# Patient Record
Sex: Male | Born: 1940 | Race: White | Hispanic: No | Marital: Married | Smoking: Former smoker
Health system: Southern US, Community
[De-identification: ages and names within clinical notes are randomized; demographics above are authoritative.]

## PROBLEM LIST (undated history)

## (undated) DIAGNOSIS — C801 Malignant (primary) neoplasm, unspecified: Secondary | ICD-10-CM

## (undated) DIAGNOSIS — I10 Essential (primary) hypertension: Secondary | ICD-10-CM

## (undated) DIAGNOSIS — F329 Major depressive disorder, single episode, unspecified: Secondary | ICD-10-CM

## (undated) DIAGNOSIS — C779 Secondary and unspecified malignant neoplasm of lymph node, unspecified: Secondary | ICD-10-CM

## (undated) DIAGNOSIS — I739 Peripheral vascular disease, unspecified: Secondary | ICD-10-CM

## (undated) DIAGNOSIS — K449 Diaphragmatic hernia without obstruction or gangrene: Secondary | ICD-10-CM

## (undated) DIAGNOSIS — I219 Acute myocardial infarction, unspecified: Secondary | ICD-10-CM

## (undated) DIAGNOSIS — R238 Other skin changes: Secondary | ICD-10-CM

## (undated) DIAGNOSIS — I714 Abdominal aortic aneurysm, without rupture, unspecified: Secondary | ICD-10-CM

## (undated) DIAGNOSIS — J449 Chronic obstructive pulmonary disease, unspecified: Secondary | ICD-10-CM

## (undated) DIAGNOSIS — Z923 Personal history of irradiation: Secondary | ICD-10-CM

## (undated) DIAGNOSIS — Z720 Tobacco use: Secondary | ICD-10-CM

## (undated) DIAGNOSIS — N289 Disorder of kidney and ureter, unspecified: Secondary | ICD-10-CM

## (undated) DIAGNOSIS — E785 Hyperlipidemia, unspecified: Secondary | ICD-10-CM

## (undated) DIAGNOSIS — E119 Type 2 diabetes mellitus without complications: Secondary | ICD-10-CM

## (undated) DIAGNOSIS — I2510 Atherosclerotic heart disease of native coronary artery without angina pectoris: Secondary | ICD-10-CM

## (undated) DIAGNOSIS — K22711 Barrett's esophagus with high grade dysplasia: Secondary | ICD-10-CM

## (undated) DIAGNOSIS — K219 Gastro-esophageal reflux disease without esophagitis: Secondary | ICD-10-CM

## (undated) DIAGNOSIS — F32A Depression, unspecified: Secondary | ICD-10-CM

## (undated) DIAGNOSIS — F419 Anxiety disorder, unspecified: Secondary | ICD-10-CM

## (undated) DIAGNOSIS — J189 Pneumonia, unspecified organism: Secondary | ICD-10-CM

## (undated) DIAGNOSIS — R233 Spontaneous ecchymoses: Secondary | ICD-10-CM

## (undated) HISTORY — DX: Major depressive disorder, single episode, unspecified: F32.9

## (undated) HISTORY — DX: Gastro-esophageal reflux disease without esophagitis: K21.9

## (undated) HISTORY — DX: Hyperlipidemia, unspecified: E78.5

## (undated) HISTORY — DX: Anxiety disorder, unspecified: F41.9

## (undated) HISTORY — DX: Personal history of irradiation: Z92.3

## (undated) HISTORY — PX: NISSEN FUNDOPLICATION: SHX2091

## (undated) HISTORY — DX: Abdominal aortic aneurysm, without rupture, unspecified: I71.40

## (undated) HISTORY — DX: Peripheral vascular disease, unspecified: I73.9

## (undated) HISTORY — DX: Diaphragmatic hernia without obstruction or gangrene: K44.9

## (undated) HISTORY — PX: TONSILLECTOMY: SUR1361

## (undated) HISTORY — DX: Tobacco use: Z72.0

## (undated) HISTORY — DX: Chronic obstructive pulmonary disease, unspecified: J44.9

## (undated) HISTORY — DX: Atherosclerotic heart disease of native coronary artery without angina pectoris: I25.10

## (undated) HISTORY — DX: Abdominal aortic aneurysm, without rupture: I71.4

## (undated) HISTORY — DX: Acute myocardial infarction, unspecified: I21.9

## (undated) HISTORY — PX: HERNIA REPAIR: SHX51

## (undated) HISTORY — DX: Depression, unspecified: F32.A

## (undated) HISTORY — PX: RADIOFREQUENCY ABLATION: SHX2290

---

## 1996-11-02 HISTORY — PX: CORONARY ANGIOPLASTY WITH STENT PLACEMENT: SHX49

## 1996-12-31 DIAGNOSIS — I219 Acute myocardial infarction, unspecified: Secondary | ICD-10-CM

## 1996-12-31 HISTORY — DX: Acute myocardial infarction, unspecified: I21.9

## 1998-06-04 ENCOUNTER — Other Ambulatory Visit: Admission: RE | Admit: 1998-06-04 | Discharge: 1998-06-04 | Payer: Self-pay | Admitting: Gastroenterology

## 1998-11-28 ENCOUNTER — Other Ambulatory Visit: Admission: RE | Admit: 1998-11-28 | Discharge: 1998-11-28 | Payer: Self-pay | Admitting: Internal Medicine

## 1999-01-13 ENCOUNTER — Encounter: Payer: Self-pay | Admitting: Gastroenterology

## 1999-01-13 ENCOUNTER — Ambulatory Visit (HOSPITAL_COMMUNITY): Admission: RE | Admit: 1999-01-13 | Discharge: 1999-01-13 | Payer: Self-pay | Admitting: Gastroenterology

## 1999-01-16 ENCOUNTER — Encounter: Payer: Self-pay | Admitting: Gastroenterology

## 1999-01-16 ENCOUNTER — Ambulatory Visit (HOSPITAL_COMMUNITY): Admission: RE | Admit: 1999-01-16 | Discharge: 1999-01-16 | Payer: Self-pay | Admitting: Gastroenterology

## 1999-02-01 ENCOUNTER — Encounter: Payer: Self-pay | Admitting: Gastroenterology

## 1999-02-01 ENCOUNTER — Ambulatory Visit (HOSPITAL_COMMUNITY): Admission: RE | Admit: 1999-02-01 | Discharge: 1999-02-01 | Payer: Self-pay | Admitting: Gastroenterology

## 1999-09-05 ENCOUNTER — Other Ambulatory Visit: Admission: RE | Admit: 1999-09-05 | Discharge: 1999-09-05 | Payer: Self-pay | Admitting: Gastroenterology

## 1999-09-05 ENCOUNTER — Encounter (INDEPENDENT_AMBULATORY_CARE_PROVIDER_SITE_OTHER): Payer: Self-pay | Admitting: Specialist

## 2000-04-23 ENCOUNTER — Ambulatory Visit (HOSPITAL_COMMUNITY): Admission: RE | Admit: 2000-04-23 | Discharge: 2000-04-23 | Payer: Self-pay | Admitting: Cardiology

## 2000-04-23 ENCOUNTER — Encounter: Payer: Self-pay | Admitting: Cardiology

## 2000-05-17 ENCOUNTER — Other Ambulatory Visit: Admission: RE | Admit: 2000-05-17 | Discharge: 2000-05-17 | Payer: Self-pay | Admitting: Gastroenterology

## 2000-05-17 ENCOUNTER — Encounter (INDEPENDENT_AMBULATORY_CARE_PROVIDER_SITE_OTHER): Payer: Self-pay | Admitting: Specialist

## 2002-09-05 DIAGNOSIS — K2980 Duodenitis without bleeding: Secondary | ICD-10-CM | POA: Insufficient documentation

## 2004-02-07 ENCOUNTER — Encounter: Admission: RE | Admit: 2004-02-07 | Discharge: 2004-05-07 | Payer: Self-pay | Admitting: Internal Medicine

## 2004-09-01 ENCOUNTER — Ambulatory Visit: Payer: Self-pay | Admitting: Cardiology

## 2004-09-03 ENCOUNTER — Ambulatory Visit: Payer: Self-pay | Admitting: Internal Medicine

## 2004-10-14 ENCOUNTER — Ambulatory Visit: Payer: Self-pay | Admitting: Cardiology

## 2004-12-04 ENCOUNTER — Ambulatory Visit: Payer: Self-pay | Admitting: Cardiology

## 2005-03-09 ENCOUNTER — Ambulatory Visit: Payer: Self-pay | Admitting: Internal Medicine

## 2005-03-10 ENCOUNTER — Ambulatory Visit: Payer: Self-pay | Admitting: Internal Medicine

## 2005-03-16 ENCOUNTER — Ambulatory Visit: Payer: Self-pay | Admitting: Gastroenterology

## 2005-04-21 ENCOUNTER — Ambulatory Visit: Payer: Self-pay | Admitting: Gastroenterology

## 2005-05-07 ENCOUNTER — Encounter (INDEPENDENT_AMBULATORY_CARE_PROVIDER_SITE_OTHER): Payer: Self-pay | Admitting: Specialist

## 2005-05-07 ENCOUNTER — Ambulatory Visit: Payer: Self-pay | Admitting: Gastroenterology

## 2005-05-07 DIAGNOSIS — K22711 Barrett's esophagus with high grade dysplasia: Secondary | ICD-10-CM

## 2005-05-07 HISTORY — PX: COLONOSCOPY: SHX5424

## 2005-06-26 ENCOUNTER — Ambulatory Visit: Payer: Self-pay | Admitting: Internal Medicine

## 2005-08-21 ENCOUNTER — Ambulatory Visit: Payer: Self-pay

## 2005-10-14 ENCOUNTER — Ambulatory Visit: Payer: Self-pay | Admitting: Cardiology

## 2005-12-18 ENCOUNTER — Ambulatory Visit: Payer: Self-pay | Admitting: Cardiology

## 2006-01-19 ENCOUNTER — Ambulatory Visit: Payer: Self-pay | Admitting: Internal Medicine

## 2006-02-19 ENCOUNTER — Ambulatory Visit: Payer: Self-pay

## 2006-04-22 ENCOUNTER — Ambulatory Visit: Payer: Self-pay | Admitting: Internal Medicine

## 2006-04-30 ENCOUNTER — Ambulatory Visit: Payer: Self-pay | Admitting: Internal Medicine

## 2006-07-21 ENCOUNTER — Ambulatory Visit: Payer: Self-pay | Admitting: Internal Medicine

## 2006-10-13 ENCOUNTER — Ambulatory Visit: Payer: Self-pay | Admitting: Internal Medicine

## 2006-10-13 LAB — CONVERTED CEMR LAB
AST: 21 units/L (ref 0–37)
Chol/HDL Ratio, serum: 4.6
HDL: 34.2 mg/dL — ABNORMAL LOW (ref >39.0)
Hgb A1c MFr Bld: 6.7 % — ABNORMAL HIGH (ref 4.6–6.0)
Triglyceride fasting, serum: 135 mg/dL (ref 0–149)

## 2006-10-15 ENCOUNTER — Ambulatory Visit: Payer: Self-pay | Admitting: Cardiology

## 2006-10-20 ENCOUNTER — Ambulatory Visit: Payer: Self-pay | Admitting: Internal Medicine

## 2007-01-13 ENCOUNTER — Ambulatory Visit: Payer: Self-pay | Admitting: Internal Medicine

## 2007-01-13 LAB — CONVERTED CEMR LAB
Creatinine, Ser: 1.4 mg/dL (ref 0.4–1.5)
Glucose, Bld: 108 mg/dL — ABNORMAL HIGH (ref 70–99)
Hgb A1c MFr Bld: 6.9 % — ABNORMAL HIGH (ref 4.6–6.0)

## 2007-01-19 ENCOUNTER — Ambulatory Visit: Payer: Self-pay | Admitting: Internal Medicine

## 2007-02-21 ENCOUNTER — Ambulatory Visit: Payer: Self-pay

## 2007-03-17 ENCOUNTER — Ambulatory Visit: Payer: Self-pay | Admitting: *Deleted

## 2007-03-25 ENCOUNTER — Ambulatory Visit: Payer: Self-pay | Admitting: Cardiology

## 2007-04-14 ENCOUNTER — Encounter: Admission: RE | Admit: 2007-04-14 | Discharge: 2007-04-14 | Payer: Self-pay | Admitting: *Deleted

## 2007-04-21 ENCOUNTER — Ambulatory Visit: Payer: Self-pay | Admitting: *Deleted

## 2007-05-11 ENCOUNTER — Ambulatory Visit: Payer: Self-pay | Admitting: Internal Medicine

## 2007-05-11 LAB — CONVERTED CEMR LAB
BUN: 39 mg/dL — ABNORMAL HIGH (ref 6–23)
CO2: 18 meq/L — ABNORMAL LOW (ref 19–32)
Cholesterol: 168 mg/dL (ref 0–200)
Creatinine, Ser: 1.8 mg/dL — ABNORMAL HIGH (ref 0.4–1.5)
GFR calc non Af Amer: 40 mL/min
Hgb A1c MFr Bld: 6.7 % — ABNORMAL HIGH (ref 4.6–6.0)
LDL Cholesterol: 99 mg/dL (ref 0–99)
Sodium: 141 meq/L (ref 135–145)
Total CHOL/HDL Ratio: 5.4

## 2007-05-18 ENCOUNTER — Ambulatory Visit: Payer: Self-pay | Admitting: Internal Medicine

## 2007-05-18 LAB — CONVERTED CEMR LAB
BUN: 41 mg/dL — ABNORMAL HIGH (ref 6–23)
CO2: 17 meq/L — ABNORMAL LOW (ref 19–32)
GFR calc Af Amer: 52 mL/min
GFR calc non Af Amer: 43 mL/min
Glucose, Bld: 117 mg/dL — ABNORMAL HIGH (ref 70–99)
Potassium: 6 meq/L — ABNORMAL HIGH (ref 3.5–5.1)

## 2007-05-27 ENCOUNTER — Ambulatory Visit: Payer: Self-pay | Admitting: Internal Medicine

## 2007-05-27 LAB — CONVERTED CEMR LAB
BUN: 15 mg/dL (ref 6–23)
CO2: 23 meq/L (ref 19–32)
Chloride: 111 meq/L (ref 96–112)
GFR calc Af Amer: 86 mL/min
GFR calc non Af Amer: 71 mL/min
Glucose, Bld: 127 mg/dL — ABNORMAL HIGH (ref 70–99)
Sodium: 141 meq/L (ref 135–145)

## 2007-08-09 ENCOUNTER — Ambulatory Visit: Payer: Self-pay | Admitting: Internal Medicine

## 2007-08-23 ENCOUNTER — Ambulatory Visit: Payer: Self-pay | Admitting: Internal Medicine

## 2007-08-23 ENCOUNTER — Encounter: Payer: Self-pay | Admitting: Internal Medicine

## 2007-08-23 DIAGNOSIS — K2970 Gastritis, unspecified, without bleeding: Secondary | ICD-10-CM | POA: Insufficient documentation

## 2007-08-23 DIAGNOSIS — K2990 Gastroduodenitis, unspecified, without bleeding: Secondary | ICD-10-CM

## 2007-08-24 ENCOUNTER — Ambulatory Visit: Payer: Self-pay | Admitting: Internal Medicine

## 2007-08-24 LAB — CONVERTED CEMR LAB
BUN: 19 mg/dL (ref 6–23)
Calcium: 9.2 mg/dL (ref 8.4–10.5)
Chloride: 111 meq/L (ref 96–112)
GFR calc Af Amer: 96 mL/min
Glucose, Bld: 125 mg/dL — ABNORMAL HIGH (ref 70–99)
Sodium: 143 meq/L (ref 135–145)

## 2007-08-31 ENCOUNTER — Encounter: Payer: Self-pay | Admitting: Internal Medicine

## 2007-08-31 ENCOUNTER — Ambulatory Visit: Payer: Self-pay | Admitting: Internal Medicine

## 2007-08-31 DIAGNOSIS — E785 Hyperlipidemia, unspecified: Secondary | ICD-10-CM

## 2007-08-31 DIAGNOSIS — I714 Abdominal aortic aneurysm, without rupture, unspecified: Secondary | ICD-10-CM | POA: Insufficient documentation

## 2007-08-31 DIAGNOSIS — G4700 Insomnia, unspecified: Secondary | ICD-10-CM

## 2007-08-31 DIAGNOSIS — J439 Emphysema, unspecified: Secondary | ICD-10-CM

## 2007-08-31 DIAGNOSIS — I2510 Atherosclerotic heart disease of native coronary artery without angina pectoris: Secondary | ICD-10-CM

## 2007-08-31 DIAGNOSIS — K219 Gastro-esophageal reflux disease without esophagitis: Secondary | ICD-10-CM | POA: Insufficient documentation

## 2007-08-31 DIAGNOSIS — F411 Generalized anxiety disorder: Secondary | ICD-10-CM | POA: Insufficient documentation

## 2007-09-06 ENCOUNTER — Ambulatory Visit: Payer: Self-pay | Admitting: Cardiology

## 2007-09-07 ENCOUNTER — Telehealth (INDEPENDENT_AMBULATORY_CARE_PROVIDER_SITE_OTHER): Payer: Self-pay | Admitting: *Deleted

## 2007-09-08 ENCOUNTER — Ambulatory Visit: Payer: Self-pay | Admitting: Internal Medicine

## 2007-09-14 ENCOUNTER — Ambulatory Visit: Payer: Self-pay | Admitting: Internal Medicine

## 2007-09-19 ENCOUNTER — Ambulatory Visit (HOSPITAL_COMMUNITY): Admission: RE | Admit: 2007-09-19 | Discharge: 2007-09-19 | Payer: Self-pay | Admitting: Internal Medicine

## 2007-10-05 ENCOUNTER — Encounter: Payer: Self-pay | Admitting: Internal Medicine

## 2007-10-20 ENCOUNTER — Encounter: Admission: RE | Admit: 2007-10-20 | Discharge: 2007-10-20 | Payer: Self-pay | Admitting: *Deleted

## 2007-10-20 ENCOUNTER — Ambulatory Visit: Payer: Self-pay | Admitting: *Deleted

## 2007-11-25 ENCOUNTER — Telehealth: Payer: Self-pay | Admitting: Internal Medicine

## 2007-11-28 ENCOUNTER — Telehealth: Payer: Self-pay | Admitting: Internal Medicine

## 2007-12-23 ENCOUNTER — Ambulatory Visit: Payer: Self-pay | Admitting: Internal Medicine

## 2007-12-23 DIAGNOSIS — F329 Major depressive disorder, single episode, unspecified: Secondary | ICD-10-CM

## 2007-12-23 LAB — CONVERTED CEMR LAB
BUN: 19 mg/dL (ref 6–23)
Chloride: 109 meq/L (ref 96–112)
GFR calc non Af Amer: 71 mL/min
Glucose, Bld: 134 mg/dL — ABNORMAL HIGH (ref 70–99)
HDL: 32 mg/dL — ABNORMAL LOW (ref >39.0)
Hgb A1c MFr Bld: 6.4 % — ABNORMAL HIGH (ref 4.6–6.0)
Total CHOL/HDL Ratio: 4.8

## 2008-01-05 ENCOUNTER — Ambulatory Visit: Payer: Self-pay | Admitting: Internal Medicine

## 2008-02-24 ENCOUNTER — Encounter: Payer: Self-pay | Admitting: Internal Medicine

## 2008-04-12 ENCOUNTER — Ambulatory Visit: Payer: Self-pay | Admitting: *Deleted

## 2008-04-12 ENCOUNTER — Encounter: Admission: RE | Admit: 2008-04-12 | Discharge: 2008-04-12 | Payer: Self-pay | Admitting: *Deleted

## 2008-04-30 ENCOUNTER — Ambulatory Visit: Payer: Self-pay | Admitting: Internal Medicine

## 2008-04-30 LAB — CONVERTED CEMR LAB
BUN: 22 mg/dL (ref 6–23)
Basophils Relative: 0.1 % (ref 0.0–1.0)
CO2: 25 meq/L (ref 19–32)
Calcium: 9.2 mg/dL (ref 8.4–10.5)
Chloride: 110 meq/L (ref 96–112)
Direct LDL: 141 mg/dL
Eosinophils Absolute: 0.7 10*3/uL (ref 0.0–0.7)
GFR calc Af Amer: 86 mL/min
GFR calc non Af Amer: 71 mL/min
HCT: 36.1 % — ABNORMAL LOW (ref 39.0–52.0)
Hemoglobin: 12.6 g/dL — ABNORMAL LOW (ref 13.0–17.0)
Lymphocytes Relative: 19 % (ref 12.0–46.0)
Neutro Abs: 7.4 10*3/uL (ref 1.4–7.7)
Platelets: 220 10*3/uL (ref 150–400)
Potassium: 4.3 meq/L (ref 3.5–5.1)
RDW: 13.2 % (ref 11.5–14.6)
Sodium: 142 meq/L (ref 135–145)
TSH: 1.7 microintl units/mL (ref 0.35–5.50)

## 2008-05-17 ENCOUNTER — Ambulatory Visit: Payer: Self-pay | Admitting: Internal Medicine

## 2008-05-17 DIAGNOSIS — F17200 Nicotine dependence, unspecified, uncomplicated: Secondary | ICD-10-CM

## 2008-05-24 ENCOUNTER — Telehealth: Payer: Self-pay | Admitting: Internal Medicine

## 2008-06-16 ENCOUNTER — Ambulatory Visit: Payer: Self-pay | Admitting: Family Medicine

## 2008-06-16 DIAGNOSIS — J441 Chronic obstructive pulmonary disease with (acute) exacerbation: Secondary | ICD-10-CM

## 2008-09-05 ENCOUNTER — Ambulatory Visit: Payer: Self-pay | Admitting: Cardiology

## 2008-09-13 ENCOUNTER — Ambulatory Visit: Payer: Self-pay | Admitting: Internal Medicine

## 2008-09-13 LAB — CONVERTED CEMR LAB
AST: 20 units/L (ref 0–37)
Albumin: 3.6 g/dL (ref 3.5–5.2)
Alkaline Phosphatase: 68 units/L (ref 39–117)
BUN: 15 mg/dL (ref 6–23)
Basophils Relative: 1 % (ref 0.0–3.0)
Calcium: 8.8 mg/dL (ref 8.4–10.5)
Chloride: 111 meq/L (ref 96–112)
Cholesterol: 147 mg/dL (ref 0–200)
LDL Cholesterol: 94 mg/dL (ref 0–99)
Lymphocytes Relative: 20.6 % (ref 12.0–46.0)
MCHC: 33.8 g/dL (ref 30.0–36.0)
Monocytes Absolute: 0.8 10*3/uL (ref 0.1–1.0)
Neutrophils Relative %: 65.7 % (ref 43.0–77.0)
Platelets: 187 10*3/uL (ref 150–400)
Total Protein: 6.5 g/dL (ref 6.0–8.3)
Triglycerides: 106 mg/dL (ref 0–149)
VLDL: 21 mg/dL (ref 0–40)
WBC: 10.5 10*3/uL (ref 4.5–10.5)

## 2008-09-20 ENCOUNTER — Ambulatory Visit: Payer: Self-pay | Admitting: Internal Medicine

## 2008-09-25 ENCOUNTER — Telehealth: Payer: Self-pay | Admitting: Internal Medicine

## 2008-10-08 ENCOUNTER — Telehealth: Payer: Self-pay | Admitting: Internal Medicine

## 2008-10-18 ENCOUNTER — Ambulatory Visit: Payer: Self-pay | Admitting: *Deleted

## 2008-10-23 ENCOUNTER — Encounter: Admission: RE | Admit: 2008-10-23 | Discharge: 2008-10-23 | Payer: Self-pay | Admitting: *Deleted

## 2008-10-25 ENCOUNTER — Ambulatory Visit: Payer: Self-pay | Admitting: *Deleted

## 2008-11-02 DIAGNOSIS — N289 Disorder of kidney and ureter, unspecified: Secondary | ICD-10-CM

## 2008-11-02 HISTORY — DX: Disorder of kidney and ureter, unspecified: N28.9

## 2008-11-02 HISTORY — PX: ABDOMINAL AORTIC ANEURYSM REPAIR: SHX42

## 2008-11-08 ENCOUNTER — Ambulatory Visit: Payer: Self-pay | Admitting: *Deleted

## 2008-11-15 ENCOUNTER — Ambulatory Visit: Payer: Self-pay

## 2008-11-15 ENCOUNTER — Ambulatory Visit: Payer: Self-pay | Admitting: Internal Medicine

## 2008-11-15 ENCOUNTER — Encounter: Payer: Self-pay | Admitting: Cardiology

## 2008-11-20 ENCOUNTER — Ambulatory Visit (HOSPITAL_COMMUNITY): Admission: RE | Admit: 2008-11-20 | Discharge: 2008-11-20 | Payer: Self-pay | Admitting: *Deleted

## 2008-11-20 ENCOUNTER — Ambulatory Visit: Payer: Self-pay | Admitting: *Deleted

## 2008-11-29 ENCOUNTER — Ambulatory Visit: Payer: Self-pay | Admitting: Internal Medicine

## 2008-12-03 ENCOUNTER — Encounter: Payer: Self-pay | Admitting: Internal Medicine

## 2008-12-05 ENCOUNTER — Encounter: Payer: Self-pay | Admitting: Internal Medicine

## 2009-01-21 ENCOUNTER — Inpatient Hospital Stay (HOSPITAL_COMMUNITY): Admission: RE | Admit: 2009-01-21 | Discharge: 2009-01-25 | Payer: Self-pay | Admitting: *Deleted

## 2009-01-21 ENCOUNTER — Ambulatory Visit: Payer: Self-pay | Admitting: *Deleted

## 2009-02-21 ENCOUNTER — Ambulatory Visit: Payer: Self-pay | Admitting: *Deleted

## 2009-03-04 ENCOUNTER — Ambulatory Visit: Payer: Self-pay | Admitting: Internal Medicine

## 2009-03-04 LAB — CONVERTED CEMR LAB
Chloride: 110 meq/L (ref 96–112)
Creatinine, Ser: 1.2 mg/dL (ref 0.4–1.5)
GFR calc non Af Amer: 64.03 mL/min (ref >60)
Glucose, Bld: 106 mg/dL — ABNORMAL HIGH (ref 70–99)
Hgb A1c MFr Bld: 6.3 % (ref 4.6–6.5)
Sodium: 144 meq/L (ref 135–145)

## 2009-03-11 ENCOUNTER — Ambulatory Visit: Payer: Self-pay | Admitting: Internal Medicine

## 2009-03-11 DIAGNOSIS — I739 Peripheral vascular disease, unspecified: Secondary | ICD-10-CM | POA: Insufficient documentation

## 2009-06-04 ENCOUNTER — Ambulatory Visit: Payer: Self-pay | Admitting: Internal Medicine

## 2009-06-04 LAB — CONVERTED CEMR LAB
ALT: 15 U/L
AST: 21 U/L
Albumin: 3.7 g/dL
Alkaline Phosphatase: 83 U/L
BUN: 22 mg/dL
Bilirubin, Direct: 0.1 mg/dL
CO2: 24 meq/L
Calcium: 9.4 mg/dL
Chloride: 114 meq/L — ABNORMAL HIGH
Cholesterol: 151 mg/dL
Creatinine, Ser: 1.5 mg/dL
GFR calc non Af Amer: 49.46 mL/min
Glucose, Bld: 114 mg/dL — ABNORMAL HIGH
HDL: 38.3 mg/dL — ABNORMAL LOW
Hgb A1c MFr Bld: 6.7 % — ABNORMAL HIGH
LDL Cholesterol: 94 mg/dL
Potassium: 5 meq/L
Sodium: 144 meq/L
Total Bilirubin: 0.5 mg/dL
Total CHOL/HDL Ratio: 4
Total Protein: 7.2 g/dL
Triglycerides: 93 mg/dL
VLDL: 18.6 mg/dL

## 2009-06-06 ENCOUNTER — Telehealth: Payer: Self-pay | Admitting: Internal Medicine

## 2009-06-10 ENCOUNTER — Ambulatory Visit: Payer: Self-pay | Admitting: Internal Medicine

## 2009-07-15 ENCOUNTER — Encounter: Payer: Self-pay | Admitting: Internal Medicine

## 2009-07-15 ENCOUNTER — Encounter: Payer: Self-pay | Admitting: Cardiology

## 2009-07-24 ENCOUNTER — Telehealth (INDEPENDENT_AMBULATORY_CARE_PROVIDER_SITE_OTHER): Payer: Self-pay | Admitting: *Deleted

## 2009-09-05 ENCOUNTER — Ambulatory Visit: Payer: Self-pay | Admitting: Vascular Surgery

## 2009-09-12 ENCOUNTER — Telehealth: Payer: Self-pay | Admitting: Cardiology

## 2009-09-12 ENCOUNTER — Ambulatory Visit: Payer: Self-pay | Admitting: Cardiology

## 2009-09-27 ENCOUNTER — Encounter: Payer: Self-pay | Admitting: Cardiology

## 2009-09-30 ENCOUNTER — Ambulatory Visit: Payer: Self-pay

## 2009-10-02 ENCOUNTER — Ambulatory Visit: Payer: Self-pay | Admitting: Internal Medicine

## 2009-10-02 LAB — CONVERTED CEMR LAB
BUN: 34 mg/dL — ABNORMAL HIGH (ref 6–23)
Calcium: 9.5 mg/dL (ref 8.4–10.5)
Chloride: 108 meq/L (ref 96–112)
GFR calc non Af Amer: 42.77 mL/min (ref >60)

## 2009-10-08 ENCOUNTER — Telehealth: Payer: Self-pay | Admitting: Cardiology

## 2009-10-09 ENCOUNTER — Ambulatory Visit: Payer: Self-pay | Admitting: Internal Medicine

## 2009-10-09 DIAGNOSIS — E875 Hyperkalemia: Secondary | ICD-10-CM

## 2009-10-14 ENCOUNTER — Encounter: Payer: Self-pay | Admitting: Cardiology

## 2009-11-15 ENCOUNTER — Encounter (INDEPENDENT_AMBULATORY_CARE_PROVIDER_SITE_OTHER): Payer: Self-pay | Admitting: *Deleted

## 2009-11-21 ENCOUNTER — Telehealth: Payer: Self-pay | Admitting: Internal Medicine

## 2009-12-11 ENCOUNTER — Encounter (INDEPENDENT_AMBULATORY_CARE_PROVIDER_SITE_OTHER): Payer: Self-pay | Admitting: *Deleted

## 2009-12-13 ENCOUNTER — Telehealth: Payer: Self-pay | Admitting: Internal Medicine

## 2009-12-13 ENCOUNTER — Encounter (INDEPENDENT_AMBULATORY_CARE_PROVIDER_SITE_OTHER): Payer: Self-pay | Admitting: *Deleted

## 2009-12-13 ENCOUNTER — Ambulatory Visit: Payer: Self-pay | Admitting: Internal Medicine

## 2009-12-26 ENCOUNTER — Ambulatory Visit: Payer: Self-pay | Admitting: Internal Medicine

## 2010-01-13 ENCOUNTER — Ambulatory Visit: Payer: Self-pay | Admitting: Internal Medicine

## 2010-01-15 LAB — CONVERTED CEMR LAB
BUN: 38 mg/dL — ABNORMAL HIGH (ref 6–23)
Calcium: 9.2 mg/dL (ref 8.4–10.5)
GFR calc non Af Amer: 35.42 mL/min (ref >60)
Potassium: 5.4 meq/L — ABNORMAL HIGH (ref 3.5–5.1)

## 2010-01-22 ENCOUNTER — Ambulatory Visit: Payer: Self-pay | Admitting: Internal Medicine

## 2010-01-22 DIAGNOSIS — N189 Chronic kidney disease, unspecified: Secondary | ICD-10-CM | POA: Insufficient documentation

## 2010-01-29 ENCOUNTER — Encounter: Payer: Self-pay | Admitting: Internal Medicine

## 2010-03-04 ENCOUNTER — Encounter (INDEPENDENT_AMBULATORY_CARE_PROVIDER_SITE_OTHER): Payer: Self-pay | Admitting: *Deleted

## 2010-03-04 ENCOUNTER — Ambulatory Visit: Payer: Self-pay | Admitting: Internal Medicine

## 2010-03-13 ENCOUNTER — Encounter: Admission: RE | Admit: 2010-03-13 | Discharge: 2010-03-13 | Payer: Self-pay | Admitting: Vascular Surgery

## 2010-03-13 ENCOUNTER — Ambulatory Visit: Payer: Self-pay | Admitting: Vascular Surgery

## 2010-03-18 ENCOUNTER — Ambulatory Visit: Payer: Self-pay | Admitting: Internal Medicine

## 2010-03-27 ENCOUNTER — Encounter: Payer: Self-pay | Admitting: Internal Medicine

## 2010-04-02 ENCOUNTER — Encounter: Payer: Self-pay | Admitting: Internal Medicine

## 2010-04-17 ENCOUNTER — Ambulatory Visit: Payer: Self-pay | Admitting: Internal Medicine

## 2010-04-17 LAB — CONVERTED CEMR LAB
BUN: 31 mg/dL — ABNORMAL HIGH (ref 6–23)
Basophils Absolute: 0.1 10*3/uL (ref 0.0–0.1)
Basophils Relative: 0.6 % (ref 0.0–3.0)
CO2: 18 meq/L — ABNORMAL LOW (ref 19–32)
Cholesterol: 174 mg/dL (ref 0–200)
Creatinine, Ser: 1.9 mg/dL — ABNORMAL HIGH (ref 0.4–1.5)
Direct LDL: 92.8 mg/dL
Glucose, Bld: 170 mg/dL — ABNORMAL HIGH (ref 70–99)
HCT: 35.4 % — ABNORMAL LOW (ref 39.0–52.0)
HDL: 39.2 mg/dL (ref >39.00)
Hemoglobin, Urine: NEGATIVE
Hemoglobin: 12.2 g/dL — ABNORMAL LOW (ref 13.0–17.0)
Leukocytes, UA: NEGATIVE
Lymphocytes Relative: 15 % (ref 12.0–46.0)
Monocytes Absolute: 0.6 10*3/uL (ref 0.1–1.0)
Monocytes Relative: 5.9 % (ref 3.0–12.0)
Nitrite: NEGATIVE
PSA: 0.99 ng/mL (ref 0.10–4.00)
Platelets: 181 10*3/uL (ref 150.0–400.0)
Potassium: 4.9 meq/L (ref 3.5–5.1)
RBC: 3.9 M/uL — ABNORMAL LOW (ref 4.22–5.81)
RDW: 14.5 % (ref 11.5–14.6)
Sodium: 141 meq/L (ref 135–145)
Total Protein: 6.8 g/dL (ref 6.0–8.3)
Urine Glucose: NEGATIVE mg/dL
WBC: 11 10*3/uL — ABNORMAL HIGH (ref 4.5–10.5)

## 2010-04-24 ENCOUNTER — Ambulatory Visit: Payer: Self-pay | Admitting: Internal Medicine

## 2010-05-06 ENCOUNTER — Telehealth: Payer: Self-pay | Admitting: Internal Medicine

## 2010-07-09 ENCOUNTER — Telehealth: Payer: Self-pay | Admitting: Internal Medicine

## 2010-07-10 ENCOUNTER — Telehealth: Payer: Self-pay | Admitting: Internal Medicine

## 2010-07-28 ENCOUNTER — Ambulatory Visit: Payer: Self-pay | Admitting: Internal Medicine

## 2010-07-29 LAB — CONVERTED CEMR LAB
Chloride: 116 meq/L — ABNORMAL HIGH (ref 96–112)
Creatinine, Ser: 2.2 mg/dL — ABNORMAL HIGH (ref 0.4–1.5)
GFR calc non Af Amer: 31.52 mL/min (ref >60)
Hgb A1c MFr Bld: 6.7 % — ABNORMAL HIGH (ref 4.6–6.5)
Potassium: 5.5 meq/L — ABNORMAL HIGH (ref 3.5–5.1)

## 2010-07-30 ENCOUNTER — Telehealth: Payer: Self-pay | Admitting: Internal Medicine

## 2010-08-06 ENCOUNTER — Ambulatory Visit: Payer: Self-pay | Admitting: Internal Medicine

## 2010-08-08 ENCOUNTER — Telehealth (INDEPENDENT_AMBULATORY_CARE_PROVIDER_SITE_OTHER): Payer: Self-pay | Admitting: *Deleted

## 2010-09-09 ENCOUNTER — Ambulatory Visit: Payer: Self-pay | Admitting: Internal Medicine

## 2010-09-10 LAB — CONVERTED CEMR LAB
CO2: 23 meq/L (ref 19–32)
Calcium: 8.4 mg/dL (ref 8.4–10.5)
Sodium: 140 meq/L (ref 135–145)

## 2010-09-15 ENCOUNTER — Telehealth: Payer: Self-pay | Admitting: Internal Medicine

## 2010-09-16 ENCOUNTER — Encounter: Payer: Self-pay | Admitting: Internal Medicine

## 2010-09-16 ENCOUNTER — Ambulatory Visit: Payer: Self-pay | Admitting: Vascular Surgery

## 2010-09-19 ENCOUNTER — Ambulatory Visit: Payer: Self-pay | Admitting: Internal Medicine

## 2010-09-19 ENCOUNTER — Telehealth: Payer: Self-pay | Admitting: Internal Medicine

## 2010-09-19 DIAGNOSIS — D485 Neoplasm of uncertain behavior of skin: Secondary | ICD-10-CM

## 2010-09-22 ENCOUNTER — Ambulatory Visit: Payer: Self-pay | Admitting: Cardiology

## 2010-10-16 ENCOUNTER — Telehealth: Payer: Self-pay | Admitting: Internal Medicine

## 2010-11-04 ENCOUNTER — Ambulatory Visit
Admission: RE | Admit: 2010-11-04 | Discharge: 2010-11-04 | Payer: Self-pay | Source: Home / Self Care | Attending: Internal Medicine | Admitting: Internal Medicine

## 2010-11-06 LAB — CONVERTED CEMR LAB
BUN: 41 mg/dL — ABNORMAL HIGH (ref 6–23)
CO2: 21 meq/L (ref 19–32)
Calcium: 9.2 mg/dL (ref 8.4–10.5)
Creatinine, Ser: 2.7 mg/dL — ABNORMAL HIGH (ref 0.4–1.5)
GFR calc non Af Amer: 24.78 mL/min — ABNORMAL LOW (ref >60.00)
Glucose, Bld: 98 mg/dL (ref 70–99)
Sodium: 140 meq/L (ref 135–145)

## 2010-11-11 ENCOUNTER — Encounter: Payer: Self-pay | Admitting: Internal Medicine

## 2010-11-11 ENCOUNTER — Ambulatory Visit
Admission: RE | Admit: 2010-11-11 | Discharge: 2010-11-11 | Payer: Self-pay | Source: Home / Self Care | Attending: Internal Medicine | Admitting: Internal Medicine

## 2010-11-13 ENCOUNTER — Telehealth: Payer: Self-pay | Admitting: Internal Medicine

## 2010-11-14 ENCOUNTER — Ambulatory Visit
Admission: RE | Admit: 2010-11-14 | Discharge: 2010-11-14 | Payer: Self-pay | Source: Home / Self Care | Attending: Internal Medicine | Admitting: Internal Medicine

## 2010-11-14 ENCOUNTER — Telehealth: Payer: Self-pay | Admitting: Internal Medicine

## 2010-11-14 DIAGNOSIS — B029 Zoster without complications: Secondary | ICD-10-CM | POA: Insufficient documentation

## 2010-11-21 ENCOUNTER — Encounter: Payer: Self-pay | Admitting: Internal Medicine

## 2010-11-23 ENCOUNTER — Encounter: Payer: Self-pay | Admitting: *Deleted

## 2010-11-28 ENCOUNTER — Ambulatory Visit
Admission: RE | Admit: 2010-11-28 | Discharge: 2010-11-28 | Payer: Self-pay | Source: Home / Self Care | Attending: Internal Medicine | Admitting: Internal Medicine

## 2010-11-28 DIAGNOSIS — R071 Chest pain on breathing: Secondary | ICD-10-CM | POA: Insufficient documentation

## 2010-12-01 ENCOUNTER — Telehealth: Payer: Self-pay | Admitting: Internal Medicine

## 2010-12-03 ENCOUNTER — Inpatient Hospital Stay (HOSPITAL_COMMUNITY)
Admission: EM | Admit: 2010-12-03 | Discharge: 2010-12-05 | DRG: 392 | Disposition: A | Discharge status: Home / Self Care | Payer: MEDICARE | Attending: Internal Medicine | Admitting: Internal Medicine

## 2010-12-03 ENCOUNTER — Emergency Department (HOSPITAL_COMMUNITY): Payer: MEDICARE

## 2010-12-03 DIAGNOSIS — F17200 Nicotine dependence, unspecified, uncomplicated: Secondary | ICD-10-CM | POA: Diagnosis present

## 2010-12-03 DIAGNOSIS — F341 Dysthymic disorder: Secondary | ICD-10-CM | POA: Diagnosis present

## 2010-12-03 DIAGNOSIS — N184 Chronic kidney disease, stage 4 (severe): Secondary | ICD-10-CM | POA: Diagnosis present

## 2010-12-03 DIAGNOSIS — I2510 Atherosclerotic heart disease of native coronary artery without angina pectoris: Secondary | ICD-10-CM | POA: Diagnosis present

## 2010-12-03 DIAGNOSIS — J4489 Other specified chronic obstructive pulmonary disease: Secondary | ICD-10-CM | POA: Diagnosis present

## 2010-12-03 DIAGNOSIS — I129 Hypertensive chronic kidney disease with stage 1 through stage 4 chronic kidney disease, or unspecified chronic kidney disease: Secondary | ICD-10-CM | POA: Diagnosis present

## 2010-12-03 DIAGNOSIS — J449 Chronic obstructive pulmonary disease, unspecified: Secondary | ICD-10-CM | POA: Diagnosis present

## 2010-12-03 DIAGNOSIS — E785 Hyperlipidemia, unspecified: Secondary | ICD-10-CM | POA: Diagnosis present

## 2010-12-03 DIAGNOSIS — K2970 Gastritis, unspecified, without bleeding: Principal | ICD-10-CM | POA: Diagnosis present

## 2010-12-03 DIAGNOSIS — E1149 Type 2 diabetes mellitus with other diabetic neurological complication: Secondary | ICD-10-CM | POA: Diagnosis present

## 2010-12-03 DIAGNOSIS — E1142 Type 2 diabetes mellitus with diabetic polyneuropathy: Secondary | ICD-10-CM | POA: Diagnosis present

## 2010-12-03 HISTORY — DX: Essential (primary) hypertension: I10

## 2010-12-03 HISTORY — DX: Disorder of kidney and ureter, unspecified: N28.9

## 2010-12-03 LAB — COMPREHENSIVE METABOLIC PANEL
Albumin: 4.5 g/dL (ref 3.5–5.2)
BUN: 68 mg/dL — ABNORMAL HIGH (ref 6–23)
Calcium: 10.1 mg/dL (ref 8.4–10.5)
Chloride: 104 mEq/L (ref 96–112)
Creatinine, Ser: 3.51 mg/dL — ABNORMAL HIGH (ref 0.4–1.5)
GFR calc Af Amer: 21 mL/min — ABNORMAL LOW (ref >60)
GFR calc non Af Amer: 17 mL/min — ABNORMAL LOW (ref >60)
Total Bilirubin: 1.1 mg/dL (ref 0.3–1.2)

## 2010-12-03 LAB — URINALYSIS, ROUTINE W REFLEX MICROSCOPIC
Ketones, ur: 15 mg/dL — AB
Leukocytes, UA: NEGATIVE
Nitrite: NEGATIVE
Specific Gravity, Urine: 1.021 (ref 1.005–1.030)
Urine Glucose, Fasting: NEGATIVE mg/dL
pH: 5 (ref 5.0–8.0)

## 2010-12-03 LAB — DIFFERENTIAL
Basophils Absolute: 0 10*3/uL (ref 0.0–0.1)
Basophils Relative: 0 % (ref 0–1)
Eosinophils Absolute: 0 10*3/uL (ref 0.0–0.7)
Monocytes Relative: 9 % (ref 3–12)
Neutro Abs: 10.1 10*3/uL — ABNORMAL HIGH (ref 1.7–7.7)
Neutrophils Relative %: 80 % — ABNORMAL HIGH (ref 43–77)

## 2010-12-03 LAB — URINE MICROSCOPIC-ADD ON

## 2010-12-03 LAB — CBC
Hemoglobin: 13.1 g/dL (ref 13.0–17.0)
Platelets: 239 10*3/uL (ref 150–400)
RBC: 4.27 MIL/uL (ref 4.22–5.81)
WBC: 12.6 10*3/uL — ABNORMAL HIGH (ref 4.0–10.5)

## 2010-12-03 LAB — CK TOTAL AND CKMB (NOT AT ARMC)
CK, MB: 1.8 ng/mL (ref 0.3–4.0)
Relative Index: 1.1 (ref 0.0–2.5)
Total CK: 157 U/L (ref 7–232)

## 2010-12-04 ENCOUNTER — Inpatient Hospital Stay (HOSPITAL_COMMUNITY): Payer: MEDICARE

## 2010-12-04 ENCOUNTER — Telehealth: Payer: Self-pay | Admitting: Internal Medicine

## 2010-12-04 ENCOUNTER — Ambulatory Visit (HOSPITAL_COMMUNITY)
Admission: RE | Admit: 2010-12-04 | Discharge: 2010-12-04 | Disposition: A | Discharge status: Home / Self Care | Payer: MEDICARE | Source: Ambulatory Visit | Attending: Internal Medicine | Admitting: Internal Medicine

## 2010-12-04 ENCOUNTER — Encounter (HOSPITAL_COMMUNITY): Payer: Self-pay

## 2010-12-04 LAB — CBC
HCT: 32.4 % — ABNORMAL LOW (ref 39.0–52.0)
MCHC: 34 g/dL (ref 30.0–36.0)
MCV: 88.8 fL (ref 78.0–100.0)
Platelets: 202 10*3/uL (ref 150–400)
RDW: 14.8 % (ref 11.5–15.5)

## 2010-12-04 LAB — OCCULT BLOOD, POC DEVICE: Fecal Occult Bld: NEGATIVE

## 2010-12-04 LAB — COMPREHENSIVE METABOLIC PANEL
BUN: 62 mg/dL — ABNORMAL HIGH (ref 6–23)
Calcium: 9 mg/dL (ref 8.4–10.5)
Glucose, Bld: 96 mg/dL (ref 70–99)
Total Protein: 6.8 g/dL (ref 6.0–8.3)

## 2010-12-04 LAB — CARDIAC PANEL(CRET KIN+CKTOT+MB+TROPI)
CK, MB: 1.8 ng/mL (ref 0.3–4.0)
Relative Index: 1.3 (ref 0.0–2.5)
Total CK: 141 U/L (ref 7–232)
Troponin I: 0.08 ng/mL — ABNORMAL HIGH (ref 0.00–0.06)
Troponin I: 0.07 ng/mL — ABNORMAL HIGH (ref 0.00–0.06)

## 2010-12-04 NOTE — Procedures (Signed)
Summary: Upper Endoscopy  Patient: [name] Note: All result statuses are Final unless otherwise noted.  Tests: (1) Upper Endoscopy (EGD)   EGD Upper Endoscopy       DONE (C)     [facility]     [address]     [place], [place]  [address]           ENDOSCOPY PROCEDURE REPORT           PATIENT:  [name], [name]  MR#:  [id number]     BIRTHDATE:  1941/01/05, 68 yrs. old  GENDER:  male           ENDOSCOPIST:  Iva Boop, MD, [facility]           PROCEDURE DATE:  03/18/2010     PROCEDURE:  EGD with biopsy     ASA CLASS:  Class III     INDICATIONS:  follow-up of Barrett's Esophagus with high-grade     dysplasia and ulcer seen at EGD 2/11 CORRECTION: LOW-GRADE     DYSPLASIA           MEDICATIONS:   Fentanyl 75 mcg IV, Versed 6 mg IV     TOPICAL ANESTHETIC:  Exactacain Spray           DESCRIPTION OF PROCEDURE:   After the risks benefits and     alternatives of the procedure were thoroughly explained, informed     consent was obtained.  The LB GIF-H180 [id number] endoscope was     introduced through the mouth and advanced to the stomach body,     without limitations.  The instrument was slowly withdrawn as the     mucosa was fully examined.     <<PROCEDUREIMAGES>>           Barrett's esophagus was found. 34-36 cm, three tongues, no ulcers     this time. Multiple biopsies were obtained and sent to pathology.     A hiatal hernia was found. It was 4 cm in size. 36-40 cm, sliding.     Otherwise the examination was normal to the proximal body.     Retroflexed views of GE junction were not done.    The scope was     then withdrawn from the patient and the procedure completed.           COMPLICATIONS:  None           ENDOSCOPIC IMPRESSION:     1) Barrett's esophagus 34-36 cm, biopsied     2) 4 cm hiatal hernia     3) Otherwise normal to gastric body     RECOMMENDATIONS:     continue current PPI (Omeprazole 40 mg bid) and await pathology     results           REPEAT EXAM:   In for EGD, pending biopsy results.           Iva Boop, MD, [facility]           CC:  The Patient           n.     REVISED:  03/28/2010 08:58 AM     eSIGNED:   Iva Boop at 03/28/2010 08:58 AM           Suanne Marker, [id number]  Note: An exclamation mark (!) indicates a result that was not dispersed into the flowsheet. Document Creation Date: 03/28/2010 8:59 AM _______________________________________________________________________  (1) Order result status: Final Collection or  observation date-time: 03/18/2010 08:53 Requested date-time:  Receipt date-time:  Reported date-time:  Referring Physician:   Ordering Physician: Stan Head [phone]) Specimen Source:  Source: Launa Grill Order Number: [id number] Lab site:

## 2010-12-04 NOTE — Procedures (Signed)
Summary: Upper Endoscopy  Patient: [name] Note: All result statuses are Final unless otherwise noted.  Tests: (1) Upper Endoscopy (EGD)   EGD Upper Endoscopy       DONE     [facility]     [address]     [place], [place]  [address]           ENDOSCOPY PROCEDURE REPORT           PATIENT:  [name], [name]  MR#:  [id number]     BIRTHDATE:  October 07, 1941, 68 yrs. old  GENDER:  male           ENDOSCOPIST:  Iva Boop, MD, [facility]           PROCEDURE DATE:  03/18/2010     PROCEDURE:  EGD with biopsy     ASA CLASS:  Class III     INDICATIONS:  follow-up of Barrett's Esophagus with high-grade     dysplasia and ulcer seen at EGD 2/11           MEDICATIONS:   Fentanyl 75 mcg IV, Versed 6 mg IV     TOPICAL ANESTHETIC:  Exactacain Spray           DESCRIPTION OF PROCEDURE:   After the risks benefits and     alternatives of the procedure were thoroughly explained, informed     consent was obtained.  The LB GIF-H180 [id number] endoscope was     introduced through the mouth and advanced to the stomach body,     without limitations.  The instrument was slowly withdrawn as the     mucosa was fully examined.     <<PROCEDUREIMAGES>>           Barrett's esophagus was found. 34-36 cm, three tongues, no ulcers     this time. Multiple biopsies were obtained and sent to pathology.     A hiatal hernia was found. It was 4 cm in size. 36-40 cm, sliding.     Otherwise the examination was normal to the proximal body.     Retroflexed views of GE junction were not done.    The scope was     then withdrawn from the patient and the procedure completed.           COMPLICATIONS:  None           ENDOSCOPIC IMPRESSION:     1) Barrett's esophagus 34-36 cm, biopsied     2) 4 cm hiatal hernia     3) Otherwise normal to gastric body     RECOMMENDATIONS:     continue current PPI (Omeprazole 40 mg bid) and await pathology     results           REPEAT EXAM:  In for EGD, pending biopsy results.         Iva Boop, MD, [facility]           CC:  The Patient           n.     eSIGNED:   Iva Boop at 03/18/2010 09:08 AM           Suanne Marker, [id number]  Note: An exclamation mark (!) indicates a result that was not dispersed into the flowsheet. Document Creation Date: 03/20/2010 5:07 PM _______________________________________________________________________  (1) Order result status: Final Collection or observation date-time: 03/18/2010 08:53 Requested date-time:  Receipt date-time:  Reported date-time:  Referring Physician:   Ordering Physician: Baldo Ash  Leone Payor [phone]) Specimen Source:  Source: Launa Grill Order Number: [id number] Lab site:   Appended Document: Upper Endoscopy     Procedures Next Due Date:    EGD: 04/2011

## 2010-12-04 NOTE — Assessment & Plan Note (Signed)
Summary: plot pt/shingles?/SD   Vital Signs:  Patient profile:   70 year old male Height:      69 inches Weight:      168.50 pounds BMI:     24.97 O2 Sat:      97 % on Room air Temp:     97.7 degrees F oral Pulse rate:   83 / minute Pulse rhythm:   regular Resp:     16 per minute BP sitting:   140 / 86  (left arm) Cuff size:   regular  Vitals Entered By: Rock Nephew CMA (November 14, 2010 9:49 AM)  O2 Flow:  Room air CC: Patient c/o pain rash// thinks shingles Is Patient Diabetic? No Pain Assessment Patient in pain? yes     Location: abdomen Intensity: 2 Type: stinging Onset of pain  Sudden  Does patient need assistance? Functional Status Self care Ambulation Normal   Primary Care Provider:  Plotnikov  CC:  Patient c/o pain rash// thinks shingles.  History of Present Illness: New to me he complains of a 2 day history of painful rash across his left back/flank/abdomen. He called this office yesterday and was started on Acyclovir but he wanted to come in today for confirmation of the diagnosis. He has not taken anything for pain.  Preventive Screening-Counseling & Management  Alcohol-Tobacco     Alcohol drinks/day: 0     Alcohol Counseling: not indicated; patient does not drink     Smoking Status: current     Packs/Day: 1pk pd     Tobacco Counseling: to quit use of tobacco products  Hep-HIV-STD-Contraception     Hepatitis Risk: no risk noted     HIV Risk: no risk noted     STD Risk: no risk noted  Clinical Review Panels:  Prevention   Last PSA:  0.99 (04/17/2010)  Immunizations   Last Flu Vaccine:  Fluvax 3+ (08/06/2010)   Last Pneumovax:  Historical (05/11/2007)  Lipid Management   Cholesterol:  174 (04/17/2010)   LDL (bad choesterol):  94 (06/04/2009)   HDL (good cholesterol):  39.20 (04/17/2010)   Triglycerides:  135 (10/13/2006)  Diabetes Management   HgBA1C:  6.7 (07/28/2010)   Creatinine:  2.7 (11/04/2010)   Last Flu Vaccine:  Fluvax 3+  (08/06/2010)   Last Pneumovax:  Historical (05/11/2007)  CBC   WBC:  11.0 (04/17/2010)   RBC:  3.90 (04/17/2010)   Hgb:  12.2 (04/17/2010)   Hct:  35.4 (04/17/2010)   Platelets:  181.0 (04/17/2010)   MCV  90.8 (04/17/2010)   MCHC  34.4 (04/17/2010)   RDW  14.5 (04/17/2010)   PMN:  74.9 (04/17/2010)   Lymphs:  15.0 (04/17/2010)   Monos:  5.9 (04/17/2010)   Eosinophils:  3.6 (04/17/2010)   Basophil:  0.6 (04/17/2010)  Complete Metabolic Panel   Glucose:  98 (11/04/2010)   Sodium:  140 (11/04/2010)   Potassium:  5.3 (11/04/2010)   Chloride:  109 (11/04/2010)   CO2:  21 (11/04/2010)   BUN:  41 (11/04/2010)   Creatinine:  2.7 (11/04/2010)   Albumin:  3.9 (04/17/2010)   Total Protein:  6.8 (04/17/2010)   Calcium:  9.2 (11/04/2010)   Total Bili:  0.3 (04/17/2010)   Alk Phos:  83 (04/17/2010)   SGPT (ALT):  12 (04/17/2010)   SGOT (AST):  16 (04/17/2010)   Medications Prior to Update: 1)  Paroxetine Hcl 20 Mg Tabs (Paroxetine Hcl) .... Take 1 Tablet By Mouth Once A Day 2)  Aspir-Low 81 Mg Tbec (  Aspirin) .... Take 1 Tablet By Mouth Once A Day 3)  Vitamin D3 1000 Unit  Tabs (Cholecalciferol) .Marland Kitchen.. 1 By Mouth Daily 4)  Omeprazole 40 Mg Cpdr (Omeprazole) .Marland Kitchen.. 1 By Mouth Two Times A Day 5)  Sodium Bicarbonate 650 Mg Tabs (Sodium Bicarbonate) .... 2 Once Daily 6)  Alprazolam 0.5 Mg Tabs (Alprazolam) .Marland Kitchen.. 1 By Mouth Two Times A Day As Needed Anxiety 7)  Maxzide-25 37.5-25 Mg Tabs (Triamterene-Hctz) .Marland Kitchen.. 1 By Mouth Q Am 8)  Labetalol Hcl 100 Mg Tabs (Labetalol Hcl) .... 2 By Mouth Once Daily 9)  Buspirone Hcl 15 Mg Tabs (Buspirone Hcl) .... Start With 1/2 Tab Two Times A Day  For Anxiety 10)  Temazepam 15 Mg Caps (Temazepam) .Marland Kitchen.. 1-2 By Mouth At Bedtime As Needed Insomnia 11)  Lipitor 40 Mg Tabs (Atorvastatin Calcium) .Marland Kitchen.. 1 By Mouth Once Daily For Cholesterol 12)  Glimepiride 1 Mg Tabs (Glimepiride) .Marland Kitchen.. 1 By Mouth Qd 13)  Acyclovir 800 Mg Tabs (Acyclovir) .Marland Kitchen.. 1 By Mouth 5 Times A  Day  Current Medications (verified): 1)  Paroxetine Hcl 20 Mg Tabs (Paroxetine Hcl) .... Take 1 Tablet By Mouth Once A Day 2)  Aspir-Low 81 Mg Tbec (Aspirin) .... Take 1 Tablet By Mouth Once A Day 3)  Vitamin D3 1000 Unit  Tabs (Cholecalciferol) .Marland Kitchen.. 1 By Mouth Daily 4)  Omeprazole 40 Mg Cpdr (Omeprazole) .Marland Kitchen.. 1 By Mouth Two Times A Day 5)  Sodium Bicarbonate 650 Mg Tabs (Sodium Bicarbonate) .... 2 Once Daily 6)  Alprazolam 0.5 Mg Tabs (Alprazolam) .Marland Kitchen.. 1 By Mouth Two Times A Day As Needed Anxiety 7)  Maxzide-25 37.5-25 Mg Tabs (Triamterene-Hctz) .Marland Kitchen.. 1 By Mouth Q Am 8)  Labetalol Hcl 100 Mg Tabs (Labetalol Hcl) .... 2 By Mouth Once Daily 9)  Buspirone Hcl 15 Mg Tabs (Buspirone Hcl) .... Start With 1/2 Tab Two Times A Day  For Anxiety 10)  Temazepam 15 Mg Caps (Temazepam) .Marland Kitchen.. 1-2 By Mouth At Bedtime As Needed Insomnia 11)  Lipitor 40 Mg Tabs (Atorvastatin Calcium) .Marland Kitchen.. 1 By Mouth Once Daily For Cholesterol 12)  Glimepiride 1 Mg Tabs (Glimepiride) .Marland Kitchen.. 1 By Mouth Qd 13)  Acyclovir 800 Mg Tabs (Acyclovir) .Marland Kitchen.. 1 By Mouth Three Times A Day 14)  Lyrica 75 Mg Caps (Pregabalin) .... One By Mouth Three Times A Day As Needed For Pain  Allergies (verified): 1)  Pcn 2)  Amlodipine Besylate 3)  Lotensin 4)  Diovan  Past History:  Past Medical History: Last updated: 09/19/2010 Anxiety COPD Diabetes mellitus, type II Hyperlipidemia Hypertension GERD  Barrett's Peripheral vascular disease  Infrarenal abdominal aortic aneurysm and right common iliac artery     aneurysm, status post endovascular aneurysm repair. Coronary artery disease status post bare-metal stent to the right     coronary in 1998 7. Continued cigarette use.  Renal insufficiency 2010 Depression  Past Surgical History: Last updated: 03/11/2009 Tonsillectomy Nissen Fundoplication  Infrarenal abdominal aortic aneurysm and right common iliac artery     aneurysm, status post endovascular aneurysm repair 2010 Dr  Madilyn Fireman  Family History: Last updated: 08/31/2007 Family History of CAD Male 1st degree relative <60 Family History Diabetes 1st degree relative  Social History: Last updated: 10/09/2009 Retired Retail banker person Married Regular exercise-no Current Smoker  Risk Factors: Alcohol Use: 0 (11/14/2010) Exercise: no (10/09/2009)  Risk Factors: Smoking Status: current (11/14/2010) Packs/Day: 1pk pd (11/14/2010)  Family History: Reviewed history from 08/31/2007 and no changes required. Family History of CAD Male 1st degree relative <  60 Family History Diabetes 1st degree relative  Social History: Reviewed history from 10/09/2009 and no changes required. Retired Retail banker person Married Regular exercise-no Current Smoker Hepatitis Risk:  no risk noted HIV Risk:  no risk noted STD Risk:  no risk noted  Review of Systems       The patient complains of suspicious skin lesions.  The patient denies anorexia, fever, weight loss, weight gain, chest pain, syncope, peripheral edema, prolonged cough, headaches, hemoptysis, abdominal pain, hematuria, muscle weakness, unusual weight change, abnormal bleeding, enlarged lymph nodes, and angioedema.   Derm:  Complains of changes in color of skin and rash; denies changes in nail beds, dryness, excessive perspiration, and itching.  Physical Exam  General:  alert, well-developed, well-nourished, well-hydrated, appropriate dress, normal appearance, healthy-appearing, cooperative to examination, and good hygiene.   Head:  normocephalic, atraumatic, no abnormalities observed, and no abnormalities palpated.   Eyes:  vision grossly intact, pupils equal, and no injection.   Ears:  R ear normal and L ear normal.   Mouth:  Oral mucosa and oropharynx without lesions or exudates.  Teeth in good repair. Neck:  supple, full ROM, no masses, no thyromegaly, no thyroid nodules or tenderness, no JVD, normal carotid upstroke, and no cervical lymphadenopathy.    Chest Wall:  surgical scar.   Breasts:  No masses or gynecomastia noted Lungs:  normal respiratory effort, no intercostal retractions, no accessory muscle use, normal breath sounds, no dullness, no fremitus, no crackles, and no wheezes.   Heart:  normal rate, regular rhythm, no murmur, no gallop, no rub, and no JVD.   Abdomen:  soft, non-tender, normal bowel sounds, no distention, no masses, no guarding, no rigidity, no rebound tenderness, no abdominal hernia, no inguinal hernia, no hepatomegaly, and no splenomegaly.   Msk:  No deformity or scoliosis noted of thoracic or lumbar spine.   Pulses:  R and L carotid,radial,femoral,dorsalis pedis and posterior tibial pulses are full and equal bilaterally Extremities:  No clubbing, cyanosis, edema, or deformity noted with normal full range of motion of all joints.   Neurologic:  No cranial nerve deficits noted. Station and gait are normal. Plantar reflexes are down-going bilaterally. DTRs are symmetrical throughout. Sensory, motor and coordinative functions appear intact. Skin:  over the left posterior back and across the flank and abd he has a group of  small vesicles on an erythematous base - the classic dew drops on rose pedals in a dermatomal pattern. he also has diffuse sk's. Cervical Nodes:  no anterior cervical adenopathy and no posterior cervical adenopathy.   Axillary Nodes:  no R axillary adenopathy and no L axillary adenopathy.   Psych:  Cognition and judgment appear intact. Alert and cooperative with normal attention span and concentration. No apparent delusions, illusions, hallucinations   Impression & Recommendations:  Problem # 1:  SHINGLES (ICD-053.9) Assessment New I think his dose of acyclovir should be lowered due to his renal insufficiency, will offer Lyrica for pain  Problem # 2:  RENAL INSUFFICIENCY (ICD-588.9) Assessment: Unchanged  Problem # 3:  HYPERTENSION (ICD-401.9) Assessment: Unchanged  His updated medication list  for this problem includes:    Maxzide-25 37.5-25 Mg Tabs (Triamterene-hctz) .Marland Kitchen... 1 by mouth q am    Labetalol Hcl 100 Mg Tabs (Labetalol hcl) .Marland Kitchen... 2 by mouth once daily  BP today: 140/86 Prior BP: 140/88 (11/11/2010)  Labs Reviewed: K+: 5.3 (11/04/2010) Creat: : 2.7 (11/04/2010)   Chol: 174 (04/17/2010)   HDL: 39.20 (04/17/2010)   LDL: 94 (  06/04/2009)   TG: 212.0 (04/17/2010)  Problem # 4:  DIABETES MELLITUS, TYPE II (ICD-250.00) Assessment: Unchanged  His updated medication list for this problem includes:    Aspir-low 81 Mg Tbec (Aspirin) .Marland Kitchen... Take 1 tablet by mouth once a day    Glimepiride 1 Mg Tabs (Glimepiride) .Marland Kitchen... 1 by mouth qd  Labs Reviewed: Creat: 2.7 (11/04/2010)    Reviewed HgBA1c results: 6.7 (07/28/2010)  6.5 (01/13/2010)  Complete Medication List: 1)  Paroxetine Hcl 20 Mg Tabs (Paroxetine hcl) .... Take 1 tablet by mouth once a day 2)  Aspir-low 81 Mg Tbec (Aspirin) .... Take 1 tablet by mouth once a day 3)  Vitamin D3 1000 Unit Tabs (Cholecalciferol) .Marland Kitchen.. 1 by mouth daily 4)  Omeprazole 40 Mg Cpdr (Omeprazole) .Marland Kitchen.. 1 by mouth two times a day 5)  Sodium Bicarbonate 650 Mg Tabs (Sodium bicarbonate) .... 2 once daily 6)  Alprazolam 0.5 Mg Tabs (Alprazolam) .Marland Kitchen.. 1 by mouth two times a day as needed anxiety 7)  Maxzide-25 37.5-25 Mg Tabs (Triamterene-hctz) .Marland Kitchen.. 1 by mouth q am 8)  Labetalol Hcl 100 Mg Tabs (Labetalol hcl) .... 2 by mouth once daily 9)  Buspirone Hcl 15 Mg Tabs (Buspirone hcl) .... Start with 1/2 tab two times a day  for anxiety 10)  Temazepam 15 Mg Caps (Temazepam) .Marland Kitchen.. 1-2 by mouth at bedtime as needed insomnia 11)  Lipitor 40 Mg Tabs (Atorvastatin calcium) .Marland Kitchen.. 1 by mouth once daily for cholesterol 12)  Glimepiride 1 Mg Tabs (Glimepiride) .Marland Kitchen.. 1 by mouth qd 13)  Acyclovir 800 Mg Tabs (Acyclovir) .Marland Kitchen.. 1 by mouth three times a day 14)  Lyrica 75 Mg Caps (Pregabalin) .... One by mouth three times a day as needed for pain  Patient Instructions: 1)   Please schedule a follow-up appointment in 2 weeks. Prescriptions: LYRICA 75 MG CAPS (PREGABALIN) One by mouth three times a day as needed for pain  #56 x 0   Entered and Authorized by:   Etta Grandchild MD   Signed by:   Etta Grandchild MD on 11/14/2010   Method used:   Samples Given   RxID:   [id number]    Orders Added: 1)  Est. Patient Level IV [14782]

## 2010-12-04 NOTE — Letter (Signed)
Summary: EGD Instructions  [facility]  [address] [place], [place] [address]   Phone: [phone]  Fax: [phone]       [name]    [birth year]    MRN: [id number]       Procedure Day /Date:  Thursday  12/26/09     Arrival Time:  7:30am     Procedure Time:  8:30am     Location of Procedure:                    _X  _ [facility] (4th Floor)    PREPARATION FOR ENDOSCOPY   On  Thursday  02/24  THE DAY OF THE PROCEDURE:  1.   No solid foods, milk or milk products are allowed after midnight the night before your procedure.  2.   Do not drink anything colored red or purple.  Avoid juices with pulp.  No orange juice.  3.  You may drink clear liquids until 6:30am  which is 2 hours before your procedure.                                                                                                CLEAR LIQUIDS INCLUDE: Water Jello Ice Popsicles Tea (sugar ok, no milk/cream) Powdered fruit flavored drinks Coffee (sugar ok, no milk/cream) Gatorade Juice: apple, white grape, white cranberry  Lemonade Clear bullion, consomm, broth Carbonated beverages (any kind) Strained chicken noodle soup Hard Candy   MEDICATION INSTRUCTIONS  Unless otherwise instructed, you should take regular prescription medications with a small sip of water as early as possible the morning of your procedure.  Diabetic patients - see separate instructions.    Additional medication instructions:  Hold Benazepril/HCTZ day of procedure.             OTHER INSTRUCTIONS  You will need a responsible adult at least 70 years of age to accompany you and drive you home.   This person must remain in the waiting room during your procedure.  Wear loose fitting clothing that is easily removed.  Leave jewelry and other valuables at home.  However, you may wish to bring a book to read or an iPod/MP3 player to listen to music as you wait for your procedure to  start.  Remove all body piercing jewelry and leave at home.  Total time from sign-in until discharge is approximately 2-3 hours.  You should go home directly after your procedure and rest.  You can resume normal activities the day after your procedure.  The day of your procedure you should not:   Drive   Make legal decisions   Operate machinery   Drink alcohol   Return to work  You will receive specific instructions about eating, activities and medications before you leave.    The above instructions have been reviewed and explained to me by   Ezra Sites RN  December 13, 2009 10:01 AM    I fully understand and can verbalize these instructions _____________________________ Date _________

## 2010-12-04 NOTE — Progress Notes (Signed)
Summary: OV THIS WEEK  Phone Note Call from Patient   Caller: Wife [phone] OR [phone] Summary of Call: Pt's wife is concerned about pt's BP, increased stress, not eating well and irritability. Wife wants a call back to explain, also does not want pt to know she called.  Initial call taken by: Lamar Sprinkles, CMA,  September 15, 2010 9:35 AM  Follow-up for Phone Call        Spoke w/wife. She is concerned about: Elevated bp when checking ie at pharm, Increased sleeping, depressed, "does not seem happy" for approx 2 mths. Pt's mother moved to assisted living recently - she was very needy per wife before this move.  Wife does not want pt to know she called the office but is very concerned b/c this is "not like him". Advised that MD may mention that she has some concerns - she says that it is ok b/c he knows she is worried.   Follow-up by: Lamar Sprinkles, CMA,  September 16, 2010 3:39 PM  Additional Follow-up for Phone Call Additional follow up Details #1::        ok ov Thank you!  Additional Follow-up by: Tresa Garter MD,  September 16, 2010 5:34 PM

## 2010-12-04 NOTE — Assessment & Plan Note (Signed)
Summary: SKIN BX AND 2 MO ROV /NWS   Vital Signs:  Patient profile:   70 year old male Height:      69 inches Weight:      166 pounds BMI:     24.60 Temp:     98.0 degrees F oral Pulse rate:   92 / minute Pulse rhythm:   regular Resp:     16 per minute BP sitting:   140 / 88  (left arm) Cuff size:   regular  Vitals Entered By: Lanier Prude, CMA(AAMA) (November 11, 2010 10:07 AM)  Procedure Note  Biopsy: Biopsy Type: Skin The patient complains of suspicious lesion and changing lesion. Indication: rule out cancer Consent signed: yes  Procedure # 1: shave biopsy    Size (in cm): 1.1 x 0.9    Region: dorsal radial    Location: R hand    Comment: Risks including but not limited by incomplete procedure, bleeding, infection, recurrence were discussed with the patient. Consent form was signed. Borders were marked with 1 mm margin. Tolerated well. Complicatons - none.     Instrument used: dermablade    Anesthesia: 2.0 ml 1% lidocaine w/epinephrine  Cleaned and prepped with: alcohol and betadine Wound dressing: vaseline and bandaid Instructions: daily dressing changes  CC: 2 mo f/u skin procedure of hand Is Patient Diabetic? Yes   Primary Care Provider:  Davelle Anselmi  CC:  2 mo f/u skin procedure of hand.  History of Present Illness: He is here for a skin biopsy  C/o med cost - Vytorin and Actos for chol and diabetes need to be changed  Current Medications (verified): 1)  Vytorin 10-80 Mg Tabs (Ezetimibe-Simvastatin) .... Take 1 Tablet By Mouth Once A Day 2)  Paroxetine Hcl 20 Mg Tabs (Paroxetine Hcl) .... Take 1 Tablet By Mouth Once A Day 3)  Aspir-Low 81 Mg Tbec (Aspirin) .... Take 1 Tablet By Mouth Once A Day 4)  Vitamin D3 1000 Unit  Tabs (Cholecalciferol) .Marland Kitchen.. 1 By Mouth Daily 5)  Omeprazole 40 Mg Cpdr (Omeprazole) .Marland Kitchen.. 1 By Mouth Two Times A Day 6)  Actos 45 Mg Tabs (Pioglitazone Hcl) .Marland Kitchen.. 1 By Mouth Once Daily For Diabetes 7)  Sodium Bicarbonate 650 Mg Tabs  (Sodium Bicarbonate) .... 2 Once Daily 8)  Alprazolam 0.5 Mg Tabs (Alprazolam) .Marland Kitchen.. 1 By Mouth Two Times A Day As Needed Anxiety 9)  Maxzide-25 37.5-25 Mg Tabs (Triamterene-Hctz) .Marland Kitchen.. 1 By Mouth Q Am 10)  Labetalol Hcl 100 Mg Tabs (Labetalol Hcl) .... 2 By Mouth Once Daily 11)  Buspirone Hcl 15 Mg Tabs (Buspirone Hcl) .... Start With 1/2 Tab Two Times A Day  For Anxiety 12)  Temazepam 15 Mg Caps (Temazepam) .Marland Kitchen.. 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 Coronary artery disease status post bare-metal stent to the right     coronary in 1998 7. Continued cigarette use.  Renal insufficiency 2010 Depression  Social History: Last updated: 10/09/2009 Retired Retail banker person Married Regular exercise-no Current Smoker  Review of Systems  The patient denies weight loss, chest pain, dyspnea on exertion, and abdominal pain.    Physical Exam  General:  elderly male in NAD overweight-appearing.   Lungs:  Normal respiratory effort, chest expands symmetrically. Lungs are clear to auscultation, no crackles or wheezes. Heart:  Normal rate and regular rhythm. S1 and S2 normal without gallop, murmur, click, rub or other extra sounds. Skin:  R hand with growth  Aging changes are present   Impression & Recommendations:  Problem # 1:  NEOPLASM OF UNCERTAIN BEHAVIOR OF SKIN (ICD-238.2) Assessment New  Orders: Shave Skin Lesion 1.1-2.0 cm, scalp/neck/hands/feet/genetalia (42595)  Problem # 2:  DIABETES MELLITUS, TYPE II (ICD-250.00) Assessment: Unchanged He is aware - no generic similar to actos is  avail The following medications were removed from the medication list:    Actos 45 Mg Tabs (Pioglitazone hcl) .Marland Kitchen... 1 by mouth once daily for diabetes His updated medication list for this problem includes:    Aspir-low 81 Mg Tbec (Aspirin) .Marland Kitchen... Take 1 tablet by mouth once a day    Glimepiride 1 Mg Tabs (Glimepiride) .Marland Kitchen... 1 by mouth qd  Problem # 3:  HYPERLIPIDEMIA (ICD-272.4) Assessment: Unchanged  The following medications were removed from the medication list:    Vytorin 10-80 Mg Tabs (Ezetimibe-simvastatin) .Marland Kitchen... Take 1 tablet by mouth once a day His updated medication list for this problem includes:    Lipitor 40 Mg Tabs (Atorvastatin calcium) .Marland Kitchen... 1 by mouth once daily for cholesterol  Complete Medication List: 1)  Paroxetine Hcl 20 Mg Tabs (Paroxetine hcl) .... Take 1 tablet by mouth once a day 2)  Aspir-low 81 Mg Tbec (Aspirin) .... Take 1 tablet by mouth once a day 3)  Vitamin D3 1000 Unit Tabs (Cholecalciferol) .Marland Kitchen.. 1 by mouth daily 4)  Omeprazole 40 Mg Cpdr (Omeprazole) .Marland Kitchen.. 1 by mouth two times a day 5)  Sodium Bicarbonate 650 Mg Tabs (Sodium bicarbonate) .... 2 once daily 6)  Alprazolam 0.5 Mg Tabs (Alprazolam) .Marland Kitchen.. 1 by mouth two times a day as needed anxiety 7)  Maxzide-25 37.5-25 Mg Tabs (Triamterene-hctz) .Marland Kitchen.. 1 by mouth q am 8)  Labetalol Hcl 100 Mg Tabs (Labetalol hcl) .... 2 by mouth once daily 9)  Buspirone Hcl 15 Mg Tabs (Buspirone hcl) .... Start with 1/2 tab two times a day  for anxiety 10)  Temazepam 15 Mg Caps (Temazepam) .Marland Kitchen.. 1-2 by mouth at bedtime as needed insomnia 11)  Lipitor 40 Mg Tabs (Atorvastatin calcium) .Marland Kitchen.. 1 by mouth once daily for cholesterol 12)  Glimepiride 1 Mg Tabs (Glimepiride) .Marland Kitchen.. 1 by mouth qd  Patient Instructions: 1)  Take Glimepride in place of Actos 2)  Take Lipitor in place of Vytorin 3)  Keep return office visit  Prescriptions: LIPITOR 40 MG TABS (ATORVASTATIN CALCIUM) 1 by mouth once daily for cholesterol  #90 x 3   Entered  and Authorized by:   Tresa Garter MD   Signed by:   Tresa Garter MD on 11/11/2010   Method used:   Print then Give to Patient   RxID:   [id number] GLIMEPIRIDE 1 MG TABS (GLIMEPIRIDE) 1 by mouth qd  #90 x 3   Entered and Authorized by:   Tresa Garter MD   Signed by:   Tresa Garter MD on 11/11/2010   Method used:   Print then Give to Patient   RxID:   [id number]    Orders Added: 1)  Shave Skin Lesion 1.1-2.0 cm, scalp/neck/hands/feet/genetalia [11307] 2)  Est. Patient Level III [55732]

## 2010-12-04 NOTE — Letter (Signed)
Summary: Patient Notice-[facility]  [address] [place], [place] [address]   Phone: [phone]  Fax: [phone]        Mar 27, 2010 MRN: [id number]    [name] [address] [place], [place]  [address]    Dear [name],  I am pleased to inform you that the biopsies taken during your recent endoscopic examination did not show any evidence of cancer upon pathologic examination.  However, your biopsies still indicate you have a condition known as Barrett's esophagus. While not cancer, it is pre-cancerous (can progress to cancer) and needs to be monitored with repeat endoscopic examination and biopsies.  Fortunately, it is quite rare that this develops into cancer, but careful monitoring of the condition along with taking your medication as prescribed is important in reducing the risk of developing cancer.  It is my recommendation that you have a repeat upper gastrointestinal endoscopic examination in 1 year.  Continue with treatment plan as outlined the day of your exam.  Please call us if you have or develop heartburn, reflux symptoms, any swallowing problems, or if you have questions about your condition that have not been fully answered at this time.  Sincerely,  Iva Boop MD, [facility]  This letter has been electronically signed by your physician.  Appended Document: Patient Notice-Barrett's [facility] letter mailed.

## 2010-12-04 NOTE — Letter (Signed)
Summary: Previsit letter  [facility]  [address] [place], [place] [address]   Phone: [phone]  Fax: [phone]       11/15/2009 MRN: [id number]  [name] [address] [place], [place]  [address]  Dear [name],  Welcome to the Gastroenterology Division at [facility].    You are scheduled to see a nurse for your pre-procedure visit on 12-12-09 at 9:00a.m. on the 3rd floor at [facility], [address]  We ask that you try to arrive at our office 15 minutes prior to your appointment time to allow for check-in.  Your nurse visit will consist of discussing your medical and surgical history, your immediate family medical history, and your medications.    Please bring a complete list of all your medications or, if you prefer, bring the medication bottles and we will list them.  We will need to be aware of both prescribed and over the counter drugs.  We will need to know exact dosage information as well.  If you are on blood thinners (Coumadin, Plavix, Aggrenox, Ticlid, etc.) please call our office today/prior to your appointment, as we need to consult with your physician about holding your medication.   Please be prepared to read and sign documents such as consent forms, a financial agreement, and acknowledgement forms.  If necessary, and with your consent, a friend or relative is welcome to sit-in on the nurse visit with you.  Please bring your insurance card so that we may make a copy of it.  If your insurance requires a referral to see a specialist, please bring your referral form from your primary care physician.  No co-pay is required for this nurse visit.     If you cannot keep your appointment, please call [phone] to cancel or reschedule prior to your appointment date.  This allows [place] the opportunity to schedule an appointment for another patient in need of care.    Thank you for choosing [facility] for your medical needs.   We appreciate the opportunity to care for you.  Please visit [place] at our website  to learn more about our practice.                     Sincerely.                                                                                                                   The Gastroenterology Division

## 2010-12-04 NOTE — Letter (Signed)
Summary: [facility]  [facility]   Imported By: Sherian Rein 05/07/2010 11:50:47  _____________________________________________________________________  External Attachment:    Type:   Image     Comment:   External Document

## 2010-12-04 NOTE — Letter (Signed)
Summary: [facility]   [facility]   Imported By: Kassie Mends 11/18/2009 07:56:29  _____________________________________________________________________  External Attachment:    Type:   Image     Comment:   External Document

## 2010-12-04 NOTE — Miscellaneous (Signed)
Summary: Procedure consent  Procedure consent   Imported By: Lester [place] 11/17/2010 07:38:56  _____________________________________________________________________  External Attachment:    Type:   Image     Comment:   External Document

## 2010-12-04 NOTE — Assessment & Plan Note (Signed)
Summary: 4 MO ROV /NWS  # requests flu shot/cd   Vital Signs:  Patient profile:   70 year old male Height:      69 inches Weight:      173 pounds BMI:     25.64 Temp:     97.2 degrees F oral Pulse rate:   69 / minute Pulse rhythm:   regular BP sitting:   168 / 100  (left arm) Cuff size:   regular  Vitals Entered By: Lanier Prude, CMA(AAMA) (August 06, 2010 9:14 AM) CC: 4 mo f/u Is Patient Diabetic? Yes Comments pt is not taking Benazepril/HCTZ.  he needsRf on Paroxetine, Zolpidem and Omeprazole.  He states Lorazepam is too expensive.   Primary Care Provider:  Plotnikov  CC:  4 mo f/u.  History of Present Illness: The patient presents for a follow up of hypertension, diabetes, hyperlipidemia C/o HTN over past months   Current Medications (verified): 1)  Paroxetine Hcl 20 Mg Tabs (Paroxetine Hcl) .... Take 1 Tablet By Mouth Once A Day 2)  Vytorin 10-80 Mg Tabs (Ezetimibe-Simvastatin) .... Take 1 Tablet By Mouth Once A Day 3)  Aspir-Low 81 Mg Tbec (Aspirin) .... Take 1 Tablet By Mouth Once A Day 4)  Metoprolol Succinate 50 Mg  Xr24h-Tab (Metoprolol Succinate) .Marland Kitchen.. 1 Once Daily 5)  Zolpidem Tartrate 10 Mg  Tabs (Zolpidem Tartrate) .... 1/2 or 1 By Mouth At [facility] As Needed 6)  Vitamin D3 1000 Unit  Tabs (Cholecalciferol) .Marland Kitchen.. 1 By Mouth Daily 7)  Temazepam 30 Mg  Caps (Temazepam) .Marland Kitchen.. 1 At Bedtime 8)  Benazepril-Hydrochlorothiazide 10-12.5 Mg Tabs (Benazepril-Hydrochlorothiazide) .Marland Kitchen.. 1 By Mouth Qd 9)  Omeprazole 40 Mg Cpdr (Omeprazole) .Marland Kitchen.. 1 By Mouth Two Times A Day 10)  Actos 45 Mg Tabs (Pioglitazone Hcl) .Marland Kitchen.. 1 By Mouth Once Daily For Diabetes 11)  Lorazepam 0.5 Mg Tabs (Lorazepam) .Marland Kitchen.. 1 Two Times A Day As Needed 12)  Sodium Bicarbonate 650 Mg Tabs (Sodium Bicarbonate) .... 2 Once Daily  Allergies (verified): 1)  Pcn 2)  Amlodipine Besylate 3)  Lotensin 4)  Diovan  Past History:  Past Medical History: Last updated: 01/22/2010 Anxiety COPD Diabetes mellitus, type  II Hyperlipidemia Hypertension GERD  Barrett's Peripheral vascular disease  Infrarenal abdominal aortic aneurysm and right common iliac artery     aneurysm, status post endovascular aneurysm repair. Coronary artery disease status post bare-metal stent to the right     coronary in 1998 7. Continued cigarette use.  Renal insufficiency 2010  Social History: Last updated: 10/09/2009 Retired Retail banker person Married Regular exercise-no Current Smoker  Review of Systems       The patient complains of dyspnea on exertion.  The patient denies weight gain, abdominal pain, and depression.    Physical Exam  General:  elderly male in NAD overweight-appearing.   Nose:  nasal discharge, no mucosal pallor.   Mouth:  Oral mucosa and oropharynx without lesions or exudates. MMM Lungs:  Normal respiratory effort, chest expands symmetrically. Lungs are clear to auscultation, no crackles or wheezes. Heart:  Normal rate and regular rhythm. S1 and S2 normal without gallop, murmur, click, rub or other extra sounds. Abdomen:  Bowel sounds positive,abdomen soft and non-tender without masses, organomegaly or hernias noted. Msk:  No deformity or scoliosis noted of thoracic or lumbar spine.   Neurologic:  No cranial nerve deficits noted. Station and gait are normal. Plantar reflexes are down-going bilaterally. DTRs are symmetrical throughout. Sensory, motor and coordinative functions appear intact. Skin:  Intact without suspicious lesions or rashes. Aging changes are present Psych:  Cognition and judgment appear intact. Alert and cooperative with normal attention span and concentration. No apparent delusions, illusions, hallucinations   Impression & Recommendations:  Problem # 1:  HYPERKALEMIA (ICD-276.7) Assessment Unchanged The labs were reviewed with the patient. See meds changes  Problem # 2:  HYPERTENSION (ICD-401.9) Assessment: Deteriorated  The following medications were removed from the  medication list:    Metoprolol Succinate 50 Mg Xr24h-tab (Metoprolol succinate) .Marland Kitchen... 1 once daily    Benazepril-hydrochlorothiazide 10-12.5 Mg Tabs (Benazepril-hydrochlorothiazide) .Marland Kitchen... 1 by mouth qd His updated medication list for this problem includes:    Toprol Xl 100 Mg Xr24h-tab (Metoprolol succinate) .Marland Kitchen... 1 by mouth qd    Maxzide-25 37.5-25 Mg Tabs (Triamterene-hctz) .Marland Kitchen... 1 by mouth q am  BP today: 168/100 Prior BP: 160/100 (04/24/2010)  Labs Reviewed: K+: 5.5 (07/28/2010) Creat: : 2.2 (07/28/2010)   Chol: 174 (04/17/2010)   HDL: 39.20 (04/17/2010)   LDL: 94 (06/04/2009)   TG: 212.0 (04/17/2010)  Problem # 3:  PERIPHERAL VASCULAR DISEASE (ICD-443.9) Assessment: Unchanged May need renal artery [place] or MRI  Problem # 4:  DIABETES MELLITUS, TYPE II (ICD-250.00) Assessment: Unchanged  The following medications were removed from the medication list:    Benazepril-hydrochlorothiazide 10-12.5 Mg Tabs (Benazepril-hydrochlorothiazide) .Marland Kitchen... 1 by mouth qd His updated medication list for this problem includes:    Aspir-low 81 Mg Tbec (Aspirin) .Marland Kitchen... Take 1 tablet by mouth once a day    Actos 45 Mg Tabs (Pioglitazone hcl) .Marland Kitchen... 1 by mouth once daily for diabetes  Labs Reviewed: Creat: 2.2 (07/28/2010)    Reviewed HgBA1c results: 6.7 (07/28/2010)  6.5 (01/13/2010)  Problem # 5:  RENAL INSUFFICIENCY (ICD-588.9) Assessment: Unchanged The labs were reviewed with the patient.   Complete Medication List: 1)  Toprol Xl 100 Mg Xr24h-tab (Metoprolol succinate) .Marland Kitchen.. 1 by mouth qd 2)  Paroxetine Hcl 20 Mg Tabs (Paroxetine hcl) .... Take 1 tablet by mouth once a day 3)  Vytorin 10-80 Mg Tabs (Ezetimibe-simvastatin) .... Take 1 tablet by mouth once a day 4)  Aspir-low 81 Mg Tbec (Aspirin) .... Take 1 tablet by mouth once a day 5)  Zolpidem Tartrate 10 Mg Tabs (Zolpidem tartrate) .... 1/2 or 1 by mouth at hs as needed 6)  Vitamin D3 1000 Unit Tabs (Cholecalciferol) .Marland Kitchen.. 1 by mouth daily 7)   Omeprazole 40 Mg Cpdr (Omeprazole) .Marland Kitchen.. 1 by mouth two times a day 8)  Actos 45 Mg Tabs (Pioglitazone hcl) .Marland Kitchen.. 1 by mouth once daily for diabetes 9)  Sodium Bicarbonate 650 Mg Tabs (Sodium bicarbonate) .... 2 once daily 10)  Alprazolam 0.5 Mg Tabs (Alprazolam) .Marland Kitchen.. 1 by mouth two times a day as needed anxiety 11)  Maxzide-25 37.5-25 Mg Tabs (Triamterene-hctz) .Marland Kitchen.. 1 by mouth q am  Other Orders: Flu Vaccine 25yrs + MEDICARE PATIENTS (Z6109) Administration Flu vaccine - MCR (U0454)  Patient Instructions: 1)  Please schedule a follow-up appointment in 6 weeks. 2)  BMP prior to visit, ICD-9: Prescriptions: OMEPRAZOLE 40 MG CPDR (OMEPRAZOLE) 1 by mouth two times a day  #180 x 3   Entered and Authorized by:   Tresa Garter MD   Signed by:   Tresa Garter MD on 08/06/2010   Method used:   Print then Give to Patient   RxID:   [id number] ZOLPIDEM TARTRATE 10 MG  TABS (ZOLPIDEM TARTRATE) 1/2 or 1 by mouth at hs as needed  #90 x 1  Entered and Authorized by:   Tresa Garter MD   Signed by:   Tresa Garter MD on 08/06/2010   Method used:   Print then Give to Patient   RxID:   [id number] PAROXETINE HCL 20 MG TABS (PAROXETINE HCL) Take 1 tablet by mouth once a day  #90 x 3   Entered and Authorized by:   Tresa Garter MD   Signed by:   Tresa Garter MD on 08/06/2010   Method used:   Print then Give to Patient   RxID:   [id number] VYTORIN 10-80 MG TABS (EZETIMIBE-SIMVASTATIN) Take 1 tablet by mouth once a day  #90 x 3   Entered and Authorized by:   Tresa Garter MD   Signed by:   Tresa Garter MD on 08/06/2010   Method used:   Print then Give to Patient   RxID:   [id number] MAXZIDE-25 37.5-25 MG TABS (TRIAMTERENE-HCTZ) 1 by mouth q am  #90 x 3   Entered and Authorized by:   Tresa Garter MD   Signed by:   Tresa Garter MD on 08/06/2010   Method used:   Print then Give to Patient   RxID:    [id number] ALPRAZOLAM 0.5 MG TABS (ALPRAZOLAM) 1 by mouth two times a day as needed anxiety  #60 x 6   Entered and Authorized by:   Tresa Garter MD   Signed by:   Tresa Garter MD on 08/06/2010   Method used:   Print then Give to Patient   RxID:   [id number]   .[email or web address]   Flu Vaccine Consent Questions     Do you have a history of severe allergic reactions to this vaccine? no    Any prior history of allergic reactions to egg and/or gelatin? no    Do you have a sensitivity to the preservative Thimersol? no    Do you have a past history of Guillan-Barre Syndrome? no    Do you currently have an acute febrile illness? no    Have you ever had a severe reaction to latex? no    Vaccine information given and explained to patient? yes    Are you currently pregnant? no    Lot Number:[id number]   Exp Date:05/02/2011   Site Given  Left Deltoid IM Lanier Prude, [facility])  August 06, 2010 10:25 AM

## 2010-12-04 NOTE — Procedures (Signed)
Summary: Upper Endoscopy  Patient: [name] Note: All result statuses are Final unless otherwise noted.  Tests: (1) Upper Endoscopy (EGD)   EGD Upper Endoscopy       DONE     [facility]     [address]     [place], [place]  [address]           ENDOSCOPY PROCEDURE REPORT           PATIENT:  [name], [name]  MR#:  [id number]     BIRTHDATE:  08/21/1941, 68 yrs. old  GENDER:  male           ENDOSCOPIST:  Iva Boop, MD, [facility]           PROCEDURE DATE:  12/26/2009     PROCEDURE:  EGD with biopsy     ASA CLASS:  Class III     INDICATIONS:  h/o Barrett's Esophagus low-grade dysplasia on 1/10     EGD           MEDICATIONS:   Fentanyl 50 mcg IV, Versed 6 mg IV     TOPICAL ANESTHETIC:  Exactacain Spray           DESCRIPTION OF PROCEDURE:   After the risks benefits and     alternatives of the procedure were thoroughly explained, informed     consent was obtained.  The LB [id number] endoscope was     introduced through the mouth and advanced to the second portion of     the duodenum, without limitations.  The instrument was slowly     withdrawn as the mucosa was fully examined.     <<PROCEDUREIMAGES>>           Barrett's esophagus was found in the distal esophagus. 34-36 cm,     three tongues Multiple biopsies were obtained and sent to     pathology.  An ulcer was found in the distal esophagus. at 34 cm     Multiple biopsies were obtained and sent to pathology.  A hiatal     hernia was found. It was 4 cm in size. 36-40 cm  Mild gastritis     was found in the antrum.  Otherwise the examination was normal.     Retroflexed views revealed a hiatal hernia.    The scope was then     withdrawn from the patient and the procedure completed.     COMPLICATIONS:  None           ENDOSCOPIC IMPRESSION:     1) Barrett's esophagus in the distal esophagus 34-36 cm -     biopsied     2) Ulcer in the distal esophagus - biospied     3) 4 cm hiatal hernia     4) Mild  gastritis in the antrum     5) Otherwise normal examination     RECOMMENDATIONS:     1) Await pathology results     may (likely) need increase in PPI (on omeprazole 20 mg bid     without GERD sxs) pending review of pathology           REPEAT EXAM:  In for EGD, pending biopsy results.           Iva Boop, MD, [facility]           CC:  Linda Hedges. Plotnikov, MD           n.     eSIGNED:  Iva Boop at 12/26/2009 08:52 AM           Suanne Marker, [id number]  Note: An exclamation mark (!) indicates a result that was not dispersed into the flowsheet. Document Creation Date: 12/26/2009 10:36 AM _______________________________________________________________________  (1) Order result status: Final Collection or observation date-time: 12/26/2009 08:41 Requested date-time:  Receipt date-time:  Reported date-time:  Referring Physician:   Ordering Physician: Stan Head [phone]) Specimen Source:  Source: Launa Grill Order Number: [id number] Lab site:

## 2010-12-04 NOTE — Progress Notes (Signed)
Summary: temazepam  Phone Note Other Incoming Call back at fax    [phone] Summary of Call: refill request on temazepam   ok x 1 refill/  will fax back to pharmacy/vg  Initial call taken by: Tora Perches,  November 21, 2009 1:20 PM    Prescriptions: TEMAZEPAM 30 MG  CAPS (TEMAZEPAM) 1 at bedtime  #90 x 1   Entered by:   Tora Perches   Authorized by:   Tresa Garter MD   Signed by:   Tora Perches on 11/21/2009   Method used:   Telephoned to ...       [facility]  [address] [id number]       [address]       [place], [place]  [address]       Ph: [phone]       Fax: [phone]   RxID:   [id number]   Appended Document: temazepam Patient called regarding this prescription and I made him aware was taken care of yesterday.

## 2010-12-04 NOTE — Progress Notes (Signed)
  Phone Note Call from Patient   Summary of Call: Patient needs refill but cost is 82$ - did not give name of med but wants cheaper alternative.  Initial call taken by: Lamar Sprinkles, CMA,  July 09, 2010 9:46 AM  Follow-up for Phone Call        Spoke w/pt,, this class of meds is not covered by plan, Advised he check pricing w/other pharmacies. He will call back with any further questions. Follow-up by: Lamar Sprinkles, CMA,  July 09, 2010 6:36 PM

## 2010-12-04 NOTE — Progress Notes (Signed)
Summary: Rf Temazepam  Phone Note Refill Request Message from:  Fax from Pharmacy  Refills Requested: Medication #1:  TEMAZEPAM 30 MG  CAPS 1 at bedtime   Dosage confirmed as above?Dosage Confirmed   Supply Requested: 3 months   [facility]: [facility] [address]   Method Requested: Telephone to Pharmacy Next Appointment Scheduled: 08-27-2010 Initial call taken by: Lanier Prude, [facility]),  May 06, 2010 5:05 PM  Follow-up for Phone Call        ok 3 months w/1 ref Follow-up by: Tresa Garter MD,  May 07, 2010 8:21 AM    Prescriptions: TEMAZEPAM 30 MG  CAPS (TEMAZEPAM) 1 at bedtime  #90 x 1   Entered by:   Lamar Sprinkles, CMA   Authorized by:   Tresa Garter MD   Signed by:   Lamar Sprinkles, CMA on 05/07/2010   Method used:   Telephoned to ...       [facility]  [address] [id number]       [address]       [place], [place]  [address]       Ph: [phone]       Fax: [phone]   RxID:   [id number]

## 2010-12-04 NOTE — Miscellaneous (Signed)
Summary: [facility] Previsit/prep  Clinical Lists Changes  Observations: Added new observation of ALLERGY REV: Done (03/04/2010 10:03)

## 2010-12-04 NOTE — Letter (Signed)
Summary: EGD Instructions  [facility]  [address] [place], [place] [address]   Phone: [phone]  Fax: [phone]       [name]    [birth year]    MRN: [id number]       Procedure Day Dorna Bloom:  [name]  03/18/10     Arrival Time:  7:30AM     Procedure Time:  8:30AM     Location of Procedure:                    _ X _ [facility] (4th Floor)   PREPARATION FOR ENDOSCOPY   On 03/18/10 THE DAY OF THE PROCEDURE:  1.   No solid foods, milk or milk products are allowed after midnight the night before your procedure.  2.   Do not drink anything colored red or purple.  Avoid juices with pulp.  No orange juice.  3.  You may drink clear liquids until 6:30AM, which is 2 hours before your procedure.                                                                                                CLEAR LIQUIDS INCLUDE: Water Jello Ice Popsicles Tea (sugar ok, no milk/cream) Powdered fruit flavored drinks Coffee (sugar ok, no milk/cream) Gatorade Juice: apple, white grape, white cranberry  Lemonade Clear bullion, consomm, broth Carbonated beverages (any kind) Strained chicken noodle soup Hard Candy   MEDICATION INSTRUCTIONS  Unless otherwise instructed, you should take regular prescription medications with a small sip of water as early as possible the morning of your procedure.  Diabetic patients - see separate instructions.           OTHER INSTRUCTIONS  You will need a responsible adult at least 70 years of age to accompany you and drive you home.   This person must remain in the waiting room during your procedure.  Wear loose fitting clothing that is easily removed.  Leave jewelry and other valuables at home.  However, you may wish to bring a book to read or an iPod/MP3 player to listen to music as you wait for your procedure to start.  Remove all body piercing jewelry and leave at home.  Total time from sign-in until discharge is  approximately 2-3 hours.  You should go home directly after your procedure and rest.  You can resume normal activities the day after your procedure.  The day of your procedure you should not:   Drive   Make legal decisions   Operate machinery   Drink alcohol   Return to work  You will receive specific instructions about eating, activities and medications before you leave.    The above instructions have been reviewed and explained to me by   Wyona Almas RN  Mar 04, 2010 10:50 AM     I fully understand and can verbalize these instructions _____________________________ Date _________

## 2010-12-04 NOTE — Letter (Signed)
Summary: [facility]  [facility]   Imported By: Kassie Mends 12/05/2009 08:11:34  _____________________________________________________________________  External Attachment:    Type:   Image     Comment:   External Document

## 2010-12-04 NOTE — Miscellaneous (Signed)
Summary: [facility] PV  Clinical Lists Changes  Observations: Added new observation of ALLERGY REV: Done (12/13/2009 9:13)

## 2010-12-04 NOTE — Progress Notes (Signed)
Summary: Rf Temazepam  Phone Note Refill Request Message from:  Fax from Pharmacy  Refills Requested: Medication #1:  TEMAZEPAM 30 MG  CAPS 1 at bedtime   Dosage confirmed as above?Dosage Confirmed   Supply Requested: 90   Last Refilled: 06/12/2010  Method Requested: Telephone to Pharmacy Next Appointment Scheduled: 08-06-10 Initial call taken by: Lanier Prude, [facility]),  July 10, 2010 12:43 PM  Follow-up for Phone Call        ok 6 ref Follow-up by: Tresa Garter MD,  July 10, 2010 12:50 PM    Prescriptions: TEMAZEPAM 30 MG  CAPS (TEMAZEPAM) 1 at bedtime  #90 x 1   Entered by:   Lamar Sprinkles, CMA   Authorized by:   Tresa Garter MD   Signed by:   Lamar Sprinkles, CMA on 07/10/2010   Method used:   Telephoned to ...       [facility]  [address] [id number]       [address]       [place], [place]  [address]       Ph: [phone]       Fax: [phone]   RxID:   [id number]

## 2010-12-04 NOTE — Progress Notes (Signed)
Summary: Swelling  Phone Note Call from Patient Call back at [facility] Phone [phone]   Summary of Call: Pt c/o swelling in ankles and feet x 3 days. Per pt, Dr Hyman Hopes gave pt amlodipine once daily which he has been on for only approx 1 week.  Initial call taken by: Lamar Sprinkles, CMA,  July 30, 2010 3:49 PM  Follow-up for Phone Call        Take Amlod 1/2 tab a day Call Dr Hyman Hopes OV if problems Follow-up by: Tresa Garter MD,  July 30, 2010 5:09 PM  Additional Follow-up for Phone Call Additional follow up Details #1::        Pt informed  Additional Follow-up by: Lamar Sprinkles, CMA,  July 30, 2010 5:21 PM

## 2010-12-04 NOTE — Progress Notes (Signed)
Summary: rx/ pharmacy  Phone Note Call from Patient   Caller: Patient Summary of Call: Patient called stating that he was seen this am and given rx. He states that the pharmacy he went to "cost too much", he is requesting that they are sent somewhere else.Marland KitchenMarland KitchenMarland KitchenAlvy Beal Archie CMA  September 19, 2010 4:00 PM   left mess to call office back.............Marland KitchenLamar Sprinkles, CMA  September 19, 2010 4:45 PM   Follow-up for Phone Call        Pt wants to know if he should take one or two paxil daily? I see 2 rx's printed at office visit. Also will need [facility]  Follow-up by: Lamar Sprinkles, CMA,  September 22, 2010 6:24 PM  Additional Follow-up for Phone Call Additional follow up Details #1::        Paxil 1 by mouth once daily ok new rx Additional Follow-up by: Tresa Garter MD,  September 23, 2010 1:06 PM    Additional Follow-up for Phone Call Additional follow up Details #2::    Pt informed  Follow-up by: Lamar Sprinkles, CMA,  September 23, 2010 2:12 PM  Prescriptions: PAROXETINE HCL 20 MG TABS (PAROXETINE HCL) Take 1 tablet by mouth once a day  #90 x 3   Entered by:   Lamar Sprinkles, CMA   Authorized by:   Tresa Garter MD   Signed by:   Lamar Sprinkles, CMA on 09/23/2010   Method used:   Electronically to        [facility] (mail-order)       [address] [place]  [address]       Ph: [phone]       Fax: [phone]   RxID:   [id number]

## 2010-12-04 NOTE — Progress Notes (Signed)
Summary: Rf Temazepam  Phone Note From Pharmacy   [facility]  [address] [phone]* Summary of Call: rec rf request for Temazepam 30mg  1 by mouth at bedtime #90.  This med is not on active med list. Please advise ok to Rf? Initial call taken by: Lanier Prude, [facility]),  October 16, 2010 12:05 PM  Follow-up for Phone Call        He chose not to use it last Oct -does he need it now? Follow-up by: Tresa Garter MD,  October 16, 2010 1:21 PM  Additional Follow-up for Phone Call Additional follow up Details #1::        pt states he does want temazepam. He states Ambien is no longer working. Additional Follow-up by: Lanier Prude, [facility]),  October 16, 2010 2:40 PM    Additional Follow-up for Phone Call Additional follow up Details #2::    ok - d/c ambien Follow-up by: Tresa Garter MD,  October 17, 2010 11:56 AM  Additional Follow-up for Phone Call Additional follow up Details #3:: Details for Additional Follow-up Action Taken: pt informed of above Additional Follow-up by: Lanier Prude, [facility]),  October 17, 2010 4:51 PM  New/Updated Medications: TEMAZEPAM 15 MG CAPS (TEMAZEPAM) 1-2 by mouth at bedtime as needed insomnia Prescriptions: TEMAZEPAM 15 MG CAPS (TEMAZEPAM) 1-2 by mouth at bedtime as needed insomnia  #60 x 6   Entered by:   Lanier Prude, [facility])   Authorized by:   Tresa Garter MD   Signed by:   Lanier Prude, CMA(AAMA) on 10/17/2010   Method used:   Telephoned to ...       [facility]  [address] [id number]       [address]       [place], [place]  [address]       Ph: [phone]       Fax: [phone]   RxID:   [id number]

## 2010-12-04 NOTE — Letter (Signed)
Summary: Diabetic Instructions  [facility]  [address] [place], [place] [address]   Phone: [phone]  Fax: [phone]    STEPHENSON CICHY 01/30/1941 MRN: [id number]   _  _   ORAL DIABETIC MEDICATION INSTRUCTIONS  The day before your procedure:   Take your diabetic pill as you do normally  The day of your procedure:   Do not take your diabetic pill    We will check your blood sugar levels during the admission process and again in Recovery before discharging you home  ________________________________________________________________________

## 2010-12-04 NOTE — Letter (Signed)
[facility]: [facility]   Imported By: Lester Sedley 09/26/2010 08:00:37  _____________________________________________________________________  External Attachment:    Type:   Image     Comment:   External Document

## 2010-12-04 NOTE — Progress Notes (Signed)
Summary: SHINGLES?  Phone Note Call from Patient Call back at [facility] Phone [phone]   Summary of Call: Pt left vm. He was just in the office yesterday. Today pt has rash on left side upwards to his back. He says it looks like when his wife had shingles. Please advise.  Initial call taken by: Lamar Sprinkles, CMA,  November 13, 2010 12:28 PM  Follow-up for Phone Call        Start Acyclovir OV tomorrow - I or anothe MD will take a look Follow-up by: Tresa Garter MD,  November 13, 2010 12:50 PM  Additional Follow-up for Phone Call Additional follow up Details #1::        Pt informed, scheduled for office visit tomorrow w/Dr Yetta Barre for eval.  Additional Follow-up by: Lamar Sprinkles, CMA,  November 13, 2010 2:59 PM    New/Updated Medications: ACYCLOVIR 800 MG TABS (ACYCLOVIR) 1 by mouth 5 times a day Prescriptions: ACYCLOVIR 800 MG TABS (ACYCLOVIR) 1 by mouth 5 times a day  #35 x 1   Entered and Authorized by:   Tresa Garter MD   Signed by:   Lamar Sprinkles, CMA on 11/13/2010   Method used:   [facility]        [facility]  [address] [id number]       [address]       [place], [place]  [address]       Ph: [phone]       Fax: [phone]   RxID:   [id number]

## 2010-12-04 NOTE — Assessment & Plan Note (Signed)
Summary: PER CHECK OUT   Visit Type:  Follow-up Primary Provider:  Plot[place]   History of Present Illness: The patient is 70 years old and return for management of CAD. In 1998 he had a bare metal stent placed to the right coronary artery. His last cardiac catheterization was in 2001 at which time he had nonobstructive coronary disease. He has done well since that time has had no recent chest pain shortness of breath or palpitations.  Lyear he was seen by Dr. Gelene Mink and was found to have retinal emboli in the left eye.He was referred here three times a day Dopplerstion for further evaluation with carotid Dopplers.  These were negative  His other main problems include hypertension hyperlipidemia and diabetes.  He has been intolerant to several medications for his blood pressure and recent adjustments have been made with labetalol and he says his blood pressures that run in the range of 120/80 recently although they are higher today. He also has chronic renal insufficiency followed by Dr. Hyman Hopes with a creatinine that is about 2.5.  His last lipid profile was fairly good with an HDL of 39 and LDL of 92.  He also had an abdominal aortic aneurysm and iliac aneurysm repair in March by Dr. Madilyn Fireman.  Unfortunately he does continue to smoke about one half pack of cigarettes per day.  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  Current Medications (verified): 1)  Vytorin 10-80 Mg Tabs (Ezetimibe-Simvastatin) .... Take 1 Tablet By Mouth Once A Day 2)  Zolpidem Tartrate 10 Mg  Tabs (Zolpidem Tartrate) .... 1/2 or 1 By Mouth At [facility] As Needed 3)  Paroxetine Hcl 20 Mg Tabs (Paroxetine Hcl) .... Take 1 Tablet By Mouth Once A Day 4)  Aspir-Low 81 Mg Tbec (Aspirin) .... Take 1 Tablet By Mouth Once A Day 5)  Vitamin D3 1000 Unit  Tabs (Cholecalciferol) .Marland Kitchen.. 1 By Mouth Daily 6)  Omeprazole 40 Mg Cpdr (Omeprazole) .Marland Kitchen.. 1 By Mouth Two Times A Day 7)  Actos 45 Mg Tabs (Pioglitazone Hcl)  .Marland Kitchen.. 1 By Mouth Once Daily For Diabetes 8)  Sodium Bicarbonate 650 Mg Tabs (Sodium Bicarbonate) .... 2 Once Daily 9)  Alprazolam 0.5 Mg Tabs (Alprazolam) .Marland Kitchen.. 1 By Mouth Two Times A Day As Needed Anxiety 10)  Maxzide-25 37.5-25 Mg Tabs (Triamterene-Hctz) .Marland Kitchen.. 1 By Mouth Q Am 11)  Labetalol Hcl 100 Mg Tabs (Labetalol Hcl) .... 2 By Mouth Once Daily 12)  Buspirone Hcl 15 Mg Tabs (Buspirone Hcl) .... Start With 1/2 Tab Two Times A Day  For Anxiety  Allergies: 1)  Pcn 2)  Amlodipine Besylate 3)  Lotensin 4)  Diovan  Past History:  Past Medical History: Reviewed history from 09/19/2010 and no changes required. Anxiety COPD Diabetes mellitus, type II Hyperlipidemia Hypertension GERD  Barrett's Peripheral vascular disease  Infrarenal abdominal aortic aneurysm and right common iliac artery     aneurysm, status post endovascular aneurysm repair. Coronary artery disease status post bare-metal stent to the right     coronary in 1998 7. Continued cigarette use.  Renal insufficiency 2010 Depression  Review of Systems       ROS is negative except as outlined in HPI.   Vital Signs:  Patient profile:   70 year old male Height:      69 inches Weight:      169 pounds BMI:     25.05 Pulse rate:   65 / minute BP sitting:   146 / 82  (  left arm)  Vitals Entered By: Laurance Flatten CMA (September 22, 2010 8:21 AM)  Physical Exam  Additional Exam:  Gen. Well-nourished, in no distress   Neck: No JVD, thyroid not enlarged, no carotid bruits Lungs: No tachypnea, clear without rales, rhonchi or wheezes Cardiovascular: Rhythm regular, PMI not displaced,  heart sounds  normal, no murmurs or gallops, no peripheral edema, pulses normal in all 4 extremities. Abdomen: BS normal, abdomen soft and non-tender without masses or organomegaly, no hepatosplenomegaly. MS: No deformities, no cyanosis or clubbing   Neuro:  No focal sns   Skin:  no lesions    Impression & Recommendations:  Problem # 1:   CORONARY ARTERY DISEASE (ICD-414.00) He had a remote bare-metal stent to the RCA. He has had no chest pain this appears stable. His ECG today was normal. The following medications were removed from the medication list:    Toprol Xl 100 Mg Xr24h-tab (Metoprolol succinate) .Marland Kitchen... 1 by mouth qd His updated medication list for this problem includes:    Aspir-low 81 Mg Tbec (Aspirin) .Marland Kitchen... Take 1 tablet by mouth once a day    Labetalol Hcl 100 Mg Tabs (Labetalol hcl) .Marland Kitchen... 2 by mouth once daily  Problem # 2:  HYPERTENSION (ICD-401.9) His blood pressure is slightly elevated today but his readings have been good at home. We will continue current therapy. The following medications were removed from the medication list:    Toprol Xl 100 Mg Xr24h-tab (Metoprolol succinate) .Marland Kitchen... 1 by mouth qd His updated medication list for this problem includes:    Aspir-low 81 Mg Tbec (Aspirin) .Marland Kitchen... Take 1 tablet by mouth once a day    Maxzide-25 37.5-25 Mg Tabs (Triamterene-hctz) .Marland Kitchen... 1 by mouth q am    Labetalol Hcl 100 Mg Tabs (Labetalol hcl) .Marland Kitchen... 2 by mouth once daily  Problem # 3:  HYPERLIPIDEMIA (ICD-272.4) He had a recent lipid profile which was quite good. His updated medication list for this problem includes:    Vytorin 10-80 Mg Tabs (Ezetimibe-simvastatin) .Marland Kitchen... Take 1 tablet by mouth once a day  Problem # 4:  RENAL INSUFFICIENCY (ICD-588.9) He has creatinines in the range of 2.5. He is off ACE inhibitors and ARB. This is followed by Dr. Hyman Hopes.  Other Orders: EKG w/ Interpretation (93000)  Patient Instructions: 1)  Your physician recommends that you continue on your current medications as directed. Please refer to the Current Medication list given to you today. 2)  Your physician wants you to follow-up in: 1 year with Dr. Clifton James. You will receive a reminder letter in the mail two months in advance. If you don't receive a letter, please call our office to schedule the follow-up appointment.

## 2010-12-04 NOTE — Assessment & Plan Note (Signed)
Summary: 3 MO ROV /NWS  #   Vital Signs:  Patient profile:   70 year old male Height:      69 inches (175.26 cm) Weight:      161.50 pounds (73.41 kg) BMI:     23.94 O2 Sat:      98 % on Room air Temp:     97.7 degrees F (36.50 degrees C) oral Pulse rate:   66 / minute BP sitting:   160 / 100  (left arm) Cuff size:   regular  Vitals Entered By: Lucious Groves (April 24, 2010 9:09 AM)  O2 Flow:  Room air CC: 3 mo rtn ov./kb Is Patient Diabetic? Yes Pain Assessment Patient in pain? no      Comments Patient notes that he is not taking Benazepril-HCTZ./kb   Primary Care Provider:  Christorpher Hisaw  CC:  3 mo rtn ov./kb.  History of Present Illness: The patient presents for a follow up of hypertension, CRF, PVD, hyperlipidemia, CAD, COPD. C/o anxiety The patient presents for a wellness examination  Patient past medical history, social history, and family history reviewed in detail no significant changes.  Patient is physically active. Depression is negative and mood is good. Hearing is normal, and able to perform activities of daily living. Risk of falling is negligible and home safety has been reviewed and is appropriate. Patient has normal height, weight, and visual acuity. Patient has been counseled on age-appropriate routine health concerns for screening and prevention. Education, counseling done.   Current Medications (verified): 1)  Paroxetine Hcl 20 Mg Tabs (Paroxetine Hcl) .... Take 1 Tablet By Mouth Once A Day 2)  Vytorin 10-80 Mg Tabs (Ezetimibe-Simvastatin) .... Take 1 Tablet By Mouth Once A Day 3)  Aspir-Low 81 Mg Tbec (Aspirin) .... Take 1 Tablet By Mouth Once A Day 4)  Metoprolol Succinate 50 Mg  Xr24h-Tab (Metoprolol Succinate) .Marland Kitchen.. 1 Once Daily 5)  Actos 30 Mg Tabs (Pioglitazone Hcl) .... Take 1 Tablet By Mouth Once A Day 6)  Zolpidem Tartrate 10 Mg  Tabs (Zolpidem Tartrate) .... 1/2 or 1 By Mouth At [facility] As Needed 7)  Vitamin D3 1000 Unit  Tabs (Cholecalciferol) .Marland Kitchen.. 1 By  Mouth Daily 8)  Temazepam 30 Mg  Caps (Temazepam) .Marland Kitchen.. 1 At Bedtime 9)  Benazepril-Hydrochlorothiazide 10-12.5 Mg Tabs (Benazepril-Hydrochlorothiazide) .Marland Kitchen.. 1 By Mouth Qd 10)  Omeprazole 40 Mg Cpdr (Omeprazole) .Marland Kitchen.. 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 Coronary artery disease status post bare-metal stent to the right     coronary in 1998 7. Continued cigarette use.  Renal insufficiency 2010  Past Surgical History: Last updated: 03/11/2009 Tonsillectomy Nissen Fundoplication  Infrarenal abdominal aortic aneurysm and right common iliac artery     aneurysm, status post endovascular aneurysm repair 2010 Dr Madilyn Fireman  Family History: Last updated: 08/31/2007 Family History of CAD Male 1st degree relative <60 Family History Diabetes 1st degree relative  Social History: Last updated: 10/09/2009 Retired Retail banker person Married Regular exercise-no Current Smoker  Review of Systems       The patient complains of dyspnea on exertion.  The patient denies fever, abdominal pain, anorexia, weight loss, weight gain, vision loss, decreased hearing, hoarseness, chest pain, syncope, peripheral edema,  prolonged cough, headaches, hemoptysis, melena, hematochezia, severe indigestion/heartburn, hematuria, incontinence, genital sores, muscle weakness, suspicious skin lesions, transient blindness, difficulty walking, depression, unusual weight change, abnormal bleeding, enlarged lymph nodes, angioedema, and testicular masses.    Physical Exam  General:  elderly male in NAD overweight-appearing.   Head:  Normocephalic and atraumatic without obvious abnormalities. No apparent  alopecia or balding. Eyes:  No corneal or conjunctival inflammation noted. EOMI. Perrla. Ears:  External ear exam shows no significant lesions or deformities.  Otoscopic examination reveals clear canals, tympanic membranes are intact bilaterally without bulging, retraction, inflammation or discharge. Hearing is grossly normal bilaterally. Nose:  nasal discharge, no mucosal pallor.   Mouth:  Oral mucosa and oropharynx without lesions or exudates. MMM Neck:  No deformities, masses, or tenderness noted. Chest Wall:  No deformities, masses, tenderness or gynecomastia noted. Lungs:  Normal respiratory effort, chest expands symmetrically. Lungs are clear to auscultation, no crackles or wheezes. Heart:  Normal rate and regular rhythm. S1 and S2 normal without gallop, murmur, click, rub or other extra sounds. Abdomen:  Bowel sounds positive,abdomen soft and non-tender without masses, organomegaly or hernias noted. Rectal:  see below Genitalia:  WNL Prostate:  Just had it done by GI Msk:  No deformity or scoliosis noted of thoracic or lumbar spine.   Pulses:  R and L carotid,radial,femoral,dorsalis pedis and posterior tibial pulses are full and equal bilaterally Extremities:  No clubbing, cyanosis, edema, or deformity noted with normal full range of motion of all joints.   Neurologic:  No cranial nerve deficits noted. Station and gait are normal. Plantar reflexes are down-going bilaterally. DTRs are symmetrical throughout. Sensory, motor and coordinative functions appear intact. Skin:  Intact without suspicious lesions or rashes. Aging changes are present Cervical Nodes:  No lymphadenopathy noted Inguinal Nodes:  No significant adenopathy Psych:  Cognition and judgment appear intact. Alert and cooperative with normal attention span and concentration. No apparent delusions, illusions, hallucinations   Impression & Recommendations:  Problem # 1:  PHYSICAL EXAMINATION (ICD-V70.0) Assessment  New Overall doing well, age appropriate education and counseling updated and referral for appropriate preventive services done unless declined, immunizations up to date or declined, diet counseling done if overweight, urged to quit smoking if smokes, most recent labs reviewed and current ordered if appropriate, ecg reviewed or declined (interpretation per ECG scanned in the EMR if done); information regarding Medicare Preventation requirements given if appropriate. The labs were reviewed with the patient.   Problem # 2:  DIABETES MELLITUS, TYPE II (ICD-250.00) Assessment: Deteriorated  The following medications were removed from the medication list:    Actos 30 Mg Tabs (Pioglitazone hcl) .Marland Kitchen... Take 1 tablet by mouth once a day His updated medication list for this problem includes:    Aspir-low 81 Mg Tbec (Aspirin) .Marland Kitchen... Take 1 tablet by mouth once a day    Benazepril-hydrochlorothiazide 10-12.5 Mg Tabs (Benazepril-hydrochlorothiazide) .Marland Kitchen... 1 by mouth qd    Actos 45 Mg Tabs (Pioglitazone hcl) .Marland Kitchen... 1 by mouth once daily for diabetes  Problem # 3:  CORONARY ARTERY DISEASE (ICD-414.00) Assessment: Unchanged  His updated medication list for this problem includes:    Aspir-low 81 Mg Tbec (Aspirin) .Marland Kitchen... Take 1 tablet by mouth once a day    Metoprolol Succinate 50 Mg Xr24h-tab (Metoprolol succinate) .Marland Kitchen... 1 once daily    Benazepril-hydrochlorothiazide 10-12.5 Mg Tabs (Benazepril-hydrochlorothiazide) .Marland Kitchen... 1 by mouth qd  Problem # 4:  RENAL INSUFFICIENCY (ICD-588.9) Assessment: Unchanged The labs were reviewed with the patient.  Problem # 5:  COPD (ICD-496) Assessment: Unchanged  Problem # 6:  TOBACCO USE DISORDER/SMOKER-SMOKING CESSATION DISCUSSED (ICD-305.1) Assessment: Unchanged  Encouraged smoking cessation and discussed different methods for smoking cessation.   Complete Medication List: 1)  Paroxetine Hcl 20 Mg Tabs (Paroxetine hcl) .... Take 1 tablet by mouth once a day 2)   Vytorin 10-80 Mg Tabs (Ezetimibe-simvastatin) .... Take 1 tablet by mouth once a day 3)  Aspir-low 81 Mg Tbec (Aspirin) .... Take 1 tablet by mouth once a day 4)  Metoprolol Succinate 50 Mg Xr24h-tab (Metoprolol succinate) .Marland Kitchen.. 1 once daily 5)  Zolpidem Tartrate 10 Mg Tabs (Zolpidem tartrate) .... 1/2 or 1 by mouth at hs as needed 6)  Vitamin D3 1000 Unit Tabs (Cholecalciferol) .Marland Kitchen.. 1 by mouth daily 7)  Temazepam 30 Mg Caps (Temazepam) .Marland Kitchen.. 1 at bedtime 8)  Benazepril-hydrochlorothiazide 10-12.5 Mg Tabs (Benazepril-hydrochlorothiazide) .Marland Kitchen.. 1 by mouth qd 9)  Omeprazole 40 Mg Cpdr (Omeprazole) .Marland Kitchen.. 1 by mouth two times a day 10)  Actos 45 Mg Tabs (Pioglitazone hcl) .Marland Kitchen.. 1 by mouth once daily for diabetes 11)  Lorazepam 0.5 Mg Tabs (Lorazepam) .Marland Kitchen.. 1 by mouth two times a day as needed anxiety  Patient Instructions: 1)  Please schedule a follow-up appointment in 4 months. 2)  BMP prior to visit, ICD-9:250.02  585.0 3)  HbgA1C prior to visit, ICD-9: Prescriptions: LORAZEPAM 0.5 MG TABS (LORAZEPAM) 1 by mouth two times a day as needed anxiety  #180 x 1   Entered and Authorized by:   Tresa Garter MD   Signed by:   Tresa Garter MD on 04/24/2010   Method used:   Print then Give to Patient   RxID:   [id number] ZOLPIDEM TARTRATE 10 MG  TABS (ZOLPIDEM TARTRATE) 1/2 or 1 by mouth at hs as needed  #90 x 1   Entered and Authorized by:   Tresa Garter MD   Signed by:   Tresa Garter MD on 04/24/2010   Method used:   Print then Give to Patient   RxID:   [id number] VYTORIN 10-80 MG TABS (EZETIMIBE-SIMVASTATIN) Take 1 tablet by mouth once a day  #90 x 3   Entered and Authorized by:   Tresa Garter MD   Signed by:   Tresa Garter MD on 04/24/2010   Method used:   Print then Give to Patient   RxID:   [id number] ACTOS 45 MG TABS (PIOGLITAZONE HCL) 1 by mouth once daily for diabetes  #90 x 3   Entered and Authorized by:   Tresa Garter MD    Signed by:   Tresa Garter MD on 04/24/2010   Method used:   Print then Give to Patient   RxID:   [id number] LORAZEPAM 0.5 MG TABS (LORAZEPAM) 1 by mouth two times a day as needed anxiety  #60 x 3   Entered and Authorized by:   Tresa Garter MD   Signed by:   Tresa Garter MD on 04/24/2010   Method used:   Print then Give to Patient   RxID:   [id number] ACTOS 45 MG TABS (PIOGLITAZONE HCL) 1 by mouth once daily for diabetes  #30 x 12   Entered and Authorized by:   Tresa Garter MD   Signed by:   Tresa Garter MD on 04/24/2010   Method used:   Print then Give to Patient   RxID:   [id number]

## 2010-12-04 NOTE — Assessment & Plan Note (Signed)
Summary: 6 WK ROV /NWS  #   Vital Signs:  Patient profile:   70 year old male Height:      69 inches Weight:      170 pounds BMI:     25.20 Temp:     97.7 degrees F oral Pulse rate:   80 / minute Pulse rhythm:   regular Resp:     16 per minute BP sitting:   140 / 82  (left arm) Cuff size:   regular  Vitals Entered By: Lanier Prude, CMA(AAMA) (September 19, 2010 9:22 AM) CC: 6 wk f/u Is Patient Diabetic? Yes Comments pt is not taking Toprol XL   Primary Care Ilynn Stauffer:  Plotnikov  CC:  6 wk f/u.  History of Present Illness: The patient presents for a follow up of hypertension, CAD, hyperlipidemia  C/o depression, moodiness C/o tremor  - worse after Metoprolol was stopped and Labetalol started  Current Medications (verified): 1)  Toprol Xl 100 Mg Xr24h-Tab (Metoprolol Succinate) .Marland Kitchen.. 1 By Mouth Qd 2)  Paroxetine Hcl 20 Mg Tabs (Paroxetine Hcl) .... Take 1 Tablet By Mouth Once A Day 3)  Vytorin 10-80 Mg Tabs (Ezetimibe-Simvastatin) .... Take 1 Tablet By Mouth Once A Day 4)  Aspir-Low 81 Mg Tbec (Aspirin) .... Take 1 Tablet By Mouth Once A Day 5)  Zolpidem Tartrate 10 Mg  Tabs (Zolpidem Tartrate) .... 1/2 or 1 By Mouth At [facility] As Needed 6)  Vitamin D3 1000 Unit  Tabs (Cholecalciferol) .Marland Kitchen.. 1 By Mouth Daily 7)  Omeprazole 40 Mg Cpdr (Omeprazole) .Marland Kitchen.. 1 By Mouth Two Times A Day 8)  Actos 45 Mg Tabs (Pioglitazone Hcl) .Marland Kitchen.. 1 By Mouth Once Daily For Diabetes 9)  Sodium Bicarbonate 650 Mg Tabs (Sodium Bicarbonate) .... 2 Once Daily 10)  Alprazolam 0.5 Mg Tabs (Alprazolam) .Marland Kitchen.. 1 By Mouth Two Times A Day As Needed Anxiety 11)  Maxzide-25 37.5-25 Mg Tabs (Triamterene-Hctz) .Marland Kitchen.. 1 By Mouth Q Am 12)  Labetalol Hcl 100 Mg Tabs (Labetalol Hcl) .... 2 By Mouth Once Daily  Allergies (verified): 1)  Pcn 2)  Amlodipine Besylate 3)  Lotensin 4)  Diovan  Past History:  Past Medical History: Anxiety COPD Diabetes mellitus, type II Hyperlipidemia Hypertension GERD   Barrett's Peripheral vascular disease  Infrarenal abdominal aortic aneurysm and right common iliac artery     aneurysm, status post endovascular aneurysm repair. Coronary artery disease status post bare-metal stent to the right     coronary in 1998 7. Continued cigarette use.  Renal insufficiency 2010 Depression  Physical Exam  General:  elderly male in NAD overweight-appearing.   Mouth:  Oral mucosa and oropharynx without lesions or exudates. MMM Neck:  No deformities, masses, or tenderness noted. Lungs:  Normal respiratory effort, chest expands symmetrically. Lungs are clear to auscultation, no crackles or wheezes. Heart:  Normal rate and regular rhythm. S1 and S2 normal without gallop, murmur, click, rub or other extra sounds. Abdomen:  Bowel sounds positive,abdomen soft and non-tender without masses, organomegaly or hernias noted. Msk:  No deformity or scoliosis noted of thoracic or lumbar spine.   Neurologic:  No cranial nerve deficits noted. Station and gait are normal. Plantar reflexes are down-going bilaterally. DTRs are symmetrical throughout. Sensory, motor and coordinative functions appear intact. Skin:  R hand with growth  Aging changes are present Psych:  Cognition and judgment appear intact. Alert and cooperative with normal attention span and concentration. No apparent delusions, illusions, hallucinationsnot suicidal, not homicidal, and depressed affect.  Impression & Recommendations:  Problem # 1:  HYPERTENSION (ICD-401.9) Assessment Improved  His updated medication list for this problem includes:    Toprol Xl 100 Mg Xr24h-tab (Metoprolol succinate) .Marland Kitchen... 1 by mouth qd    Maxzide-25 37.5-25 Mg Tabs (Triamterene-hctz) .Marland Kitchen... 1 by mouth q am    Labetalol Hcl 100 Mg Tabs (Labetalol hcl) .Marland Kitchen... 2 by mouth once daily  BP today: 140/82 Prior BP: 168/100 (08/06/2010)  Labs Reviewed: K+: 5.4 (09/09/2010) Creat: : 2.5 (09/09/2010)   Chol: 174 (04/17/2010)   HDL:  39.20 (04/17/2010)   LDL: 94 (06/04/2009)   TG: 212.0 (04/17/2010)  Problem # 2:  RENAL INSUFFICIENCY (ICD-588.9) Assessment: Deteriorated F/u w/Dr Silverio Decamp in 1 month  Problem # 3:  CHRONIC OBSTRUCTIVE PULMONARY DISEASE, ACUTE EXACERBATION (ICD-491.21) Assessment: Unchanged  Problem # 4:  CORONARY ARTERY DISEASE (ICD-414.00) Assessment: Unchanged  His updated medication list for this problem includes:    Toprol Xl 100 Mg Xr24h-tab (Metoprolol succinate) .Marland Kitchen... 1 by mouth qd    Aspir-low 81 Mg Tbec (Aspirin) .Marland Kitchen... Take 1 tablet by mouth once a day    Maxzide-25 37.5-25 Mg Tabs (Triamterene-hctz) .Marland Kitchen... 1 by mouth q am    Labetalol Hcl 100 Mg Tabs (Labetalol hcl) .Marland Kitchen... 2 by mouth once daily  Problem # 5:  DEPRESSION (ICD-311) Assessment: Deteriorated  His updated medication list for this problem includes:    Paroxetine Hcl 20 Mg Tabs (Paroxetine hcl) .Marland Kitchen... Take 1 tablet by mouth once a day    Alprazolam 0.5 Mg Tabs (Alprazolam) .Marland Kitchen... 1 by mouth two times a day as needed anxiety    Buspirone Hcl 15 Mg Tabs (Buspirone hcl) ..... Start with 1/2 tab two times a day  for anxiety  Problem # 6:  NEOPLASM OF UNCERTAIN BEHAVIOR OF SKIN (ICD-238.2) R hand Assessment: New Sch biopsy   Complete Medication List: 1)  Toprol Xl 100 Mg Xr24h-tab (Metoprolol succinate) .Marland Kitchen.. 1 by mouth qd 2)  Paroxetine Hcl 20 Mg Tabs (Paroxetine hcl) .... Take 1 tablet by mouth once a day 3)  Vytorin 10-80 Mg Tabs (Ezetimibe-simvastatin) .... Take 1 tablet by mouth once a day 4)  Aspir-low 81 Mg Tbec (Aspirin) .... Take 1 tablet by mouth once a day 5)  Zolpidem Tartrate 10 Mg Tabs (Zolpidem tartrate) .... 1/2 or 1 by mouth at hs as needed 6)  Vitamin D3 1000 Unit Tabs (Cholecalciferol) .Marland Kitchen.. 1 by mouth daily 7)  Omeprazole 40 Mg Cpdr (Omeprazole) .Marland Kitchen.. 1 by mouth two times a day 8)  Actos 45 Mg Tabs (Pioglitazone hcl) .Marland Kitchen.. 1 by mouth once daily for diabetes 9)  Sodium Bicarbonate 650 Mg Tabs (Sodium  bicarbonate) .... 2 once daily 10)  Alprazolam 0.5 Mg Tabs (Alprazolam) .Marland Kitchen.. 1 by mouth two times a day as needed anxiety 11)  Maxzide-25 37.5-25 Mg Tabs (Triamterene-hctz) .Marland Kitchen.. 1 by mouth q am 12)  Labetalol Hcl 100 Mg Tabs (Labetalol hcl) .... 2 by mouth once daily 13)  Buspirone Hcl 15 Mg Tabs (Buspirone hcl) .... Start with 1/2 tab two times a day  for anxiety  Patient Instructions: 1)  Please schedule a follow-up appointment in 2 months. 2)  Call if you are not better in a reasonable amount of time or if worse.  3)  BMP prior to visit, ICD-9:585 4)  Skin biopsy w/me Prescriptions: BUSPIRONE HCL 15 MG TABS (BUSPIRONE HCL) Start with 1/2 tab two times a day  for anxiety  #30 x 6   Entered and Authorized by:  Tresa Garter MD   Signed by:   Tresa Garter MD on 09/19/2010   Method used:   Print then Give to Patient   RxID:   [id number] PAROXETINE HCL 20 MG TABS (PAROXETINE HCL) Take 1 tablet by mouth once a day  #30 x 11   Entered and Authorized by:   Tresa Garter MD   Signed by:   Tresa Garter MD on 09/19/2010   Method used:   Print then Give to Patient   RxID:   [id number] PAROXETINE HCL 20 MG TABS (PAROXETINE HCL) Take 2 tablet by mouth once a day  #60 x 6   Entered and Authorized by:   Tresa Garter MD   Signed by:   Tresa Garter MD on 09/19/2010   Method used:   Print then Give to Patient   RxID:   [id number]    Orders Added: 1)  Est. Patient Level IV [53664]   Immunization History:  Pneumovax Immunization History:    Pneumovax:  historical (05/11/2007)   Immunization History:  Pneumovax Immunization History:    Pneumovax:  Historical (05/11/2007)

## 2010-12-04 NOTE — Progress Notes (Signed)
Summary: RX REFILL  Phone Note Call from Patient Call back at Home Phone [phone]   Caller: Patient Call For: Tresa Garter MD Summary of Call: Pt came into the office to request prescriptions to be send to [facility]. He is requesting OMEPRAZOLE CAP 20MG , METOPROLOL SUCC TAB 50MG  ER, ZOLPIDEM TAB 10MG . Pt's account number is [id number] and plan name is [employer]. Initial call taken by: Livingston Diones,  December 13, 2009 9:15 AM  Follow-up for Phone Call        rx faxed to [facility] Follow-up by: Daphane Shepherd,  December 16, 2009 11:00 AM    Prescriptions: ZOLPIDEM TARTRATE 10 MG  TABS (ZOLPIDEM TARTRATE) 1/2 or 1 by mouth at hs as needed  #90 x 1   Entered by:   Rock Nephew CMA   Authorized by:   Tresa Garter MD   Signed by:   Rock Nephew CMA on 12/13/2009   Method used:   Printed then faxed to ...       [facility]* (mail-order)       [address]       [place], [place]  [address]       Ph: [phone]       Fax: [phone]   RxID:   [id number] METOPROLOL SUCCINATE 50 MG  XR24H-TAB (METOPROLOL SUCCINATE) 1 once daily  #90 x 3   Entered by:   Rock Nephew CMA   Authorized by:   Tresa Garter MD   Signed by:   Rock Nephew CMA on 12/13/2009   Method used:   Faxed to ...       [facility]* (mail-order)       [address]       [place], [place]  [address]       Ph: [phone]       Fax: [phone]   RxID:   [id number] OMEPRAZOLE 20 MG  CPDR (OMEPRAZOLE) 2 once daily  #180 x 3   Entered by:   Rock Nephew CMA   Authorized by:   Tresa Garter MD   Signed by:   Rock Nephew CMA on 12/13/2009   Method used:   Faxed to ...       [facility]* (mail-order)       [address]       [place], [place]  [address]       Ph: [phone]       Fax: [phone]   RxID:   [id number]

## 2010-12-04 NOTE — Progress Notes (Signed)
Summary: DX CODE  ---- Converted from flag ---- ---- 08/07/2010 7:49 PM, Georgina Quint Plotnikov MD wrote: 401.1 Thanks!  ---- 08/06/2010 9:40 AM, Hilarie Fredrickson wrote: PLEASE SEND THE DX CODE FOR BMP TO ADD TO THE LAB ORDER. THANKS. ------------------------------

## 2010-12-04 NOTE — Assessment & Plan Note (Signed)
Summary: 3 MO ROV /NWS #   Vital Signs:  Patient profile:   70 year old male Weight:      171 pounds Temp:     97.8 degrees F oral Pulse rate:   75 / minute BP sitting:   120 / 70  (left arm)  Vitals Entered By: Tora Perches (January 22, 2010 8:38 AM) CC: f/u Is Patient Diabetic? Yes   Primary Care Provider:  Dillion Stowers  CC:  f/u.  History of Present Illness: The patient presents for a follow up of hypertension, GERD, CRI,  hyperlipidemia, DM   Preventive Screening-Counseling & Management  Alcohol-Tobacco     Smoking Status: current  Current Medications (verified): 1)  Paroxetine Hcl 20 Mg Tabs (Paroxetine Hcl) .... Take 1 Tablet By Mouth Once A Day 2)  Vytorin 10-80 Mg Tabs (Ezetimibe-Simvastatin) .... Take 1 Tablet By Mouth Once A Day 3)  Aspir-Low 81 Mg Tbec (Aspirin) .... Take 1 Tablet By Mouth Once A Day 4)  Metoprolol Succinate 50 Mg  Xr24h-Tab (Metoprolol Succinate) .Marland Kitchen.. 1 Once Daily 5)  Actos 30 Mg Tabs (Pioglitazone Hcl) .... Take 1 Tablet By Mouth Once A Day 6)  Zolpidem Tartrate 10 Mg  Tabs (Zolpidem Tartrate) .... 1/2 or 1 By Mouth At [facility] As Needed 7)  Vitamin D3 1000 Unit  Tabs (Cholecalciferol) .Marland Kitchen.. 1 By Mouth Daily 8)  Temazepam 30 Mg  Caps (Temazepam) .Marland Kitchen.. 1 At Bedtime 9)  Benazepril-Hydrochlorothiazide 10-12.5 Mg Tabs (Benazepril-Hydrochlorothiazide) .Marland Kitchen.. 1 By Mouth Qd 10)  Omeprazole 40 Mg Cpdr (Omeprazole) .Marland Kitchen.. 1 By Mouth Two Times A Day  Allergies: 1)  Pcn  Past History:  Social History: Last updated: 10/09/2009 Retired Retail banker person Married Regular exercise-no Current Smoker  Past Medical History: Anxiety COPD Diabetes mellitus, type II Hyperlipidemia Hypertension GERD  Barrett's Peripheral vascular disease  Infrarenal abdominal aortic aneurysm and right common iliac artery     aneurysm, status post endovascular aneurysm repair. Coronary artery disease status post bare-metal stent to the right     coronary in 1998 7. Continued  cigarette use.  Renal insufficiency 2010  Physical Exam  General:  elderly male in NAD overweight-appearing.   Nose:  nasal discharge, no mucosal pallor.   Mouth:  Oral mucosa and oropharynx without lesions or exudates. MMM Lungs:  CTA Heart:  RRR Abdomen:  Bowel sounds positive,abdomen soft and non-tender without masses, organomegaly or hernias noted. Msk:  No deformity or scoliosis noted of thoracic or lumbar spine.   Extremities:  No clubbing, cyanosis, edema, or deformity noted with normal full range of motion of all joints.   Neurologic:  No cranial nerve deficits noted. Station and gait are normal. Plantar reflexes are down-going bilaterally. DTRs are symmetrical throughout. Sensory, motor and coordinative functions appear intact. Skin:  Intact without suspicious lesions or rashes Psych:  Cognition and judgment appear intact. Alert and cooperative with normal attention span and concentration. No apparent delusions, illusions, hallucinations   Impression & Recommendations:  Problem # 1:  HYPERTENSION (ICD-401.9) Assessment Improved  His updated medication list for this problem includes:    Metoprolol Succinate 50 Mg Xr24h-tab (Metoprolol succinate) .Marland Kitchen... 1 once daily    Benazepril-hydrochlorothiazide 10-12.5 Mg Tabs (Benazepril-hydrochlorothiazide) .Marland Kitchen... 1 by mouth qd  Orders: Nephrology Referral (Nephro)  Problem # 2:  CORONARY ARTERY DISEASE (ICD-414.00) Assessment: Unchanged  His updated medication list for this problem includes:    Aspir-low 81 Mg Tbec (Aspirin) .Marland Kitchen... Take 1 tablet by mouth once a day    Metoprolol  Succinate 50 Mg Xr24h-tab (Metoprolol succinate) .Marland Kitchen... 1 once daily    Benazepril-hydrochlorothiazide 10-12.5 Mg Tabs (Benazepril-hydrochlorothiazide) .Marland Kitchen... 1 by mouth qd  Problem # 3:  CORONARY ARTERY DISEASE (ICD-414.00) Assessment: Unchanged  His updated medication list for this problem includes:    Aspir-low 81 Mg Tbec (Aspirin) .Marland Kitchen... Take 1 tablet by  mouth once a day    Metoprolol Succinate 50 Mg Xr24h-tab (Metoprolol succinate) .Marland Kitchen... 1 once daily    Benazepril-hydrochlorothiazide 10-12.5 Mg Tabs (Benazepril-hydrochlorothiazide) .Marland Kitchen... 1 by mouth qd  Problem # 4:  CHRONIC OBSTRUCTIVE PULMONARY DISEASE, ACUTE EXACERBATION (ICD-491.21) Assessment: Unchanged  Problem # 5:  RENAL INSUFFICIENCY (ICD-588.9) Assessment: Deteriorated He had a Urol w/u recentely in 2010 Dr Vernie Ammons Orders: Nephrology Referral (Nephro)  Problem # 6:  HYPERKALEMIA (ICD-276.7) Assessment: Deteriorated As per #5  Complete Medication List: 1)  Paroxetine Hcl 20 Mg Tabs (Paroxetine hcl) .... Take 1 tablet by mouth once a day 2)  Vytorin 10-80 Mg Tabs (Ezetimibe-simvastatin) .... Take 1 tablet by mouth once a day 3)  Aspir-low 81 Mg Tbec (Aspirin) .... Take 1 tablet by mouth once a day 4)  Metoprolol Succinate 50 Mg Xr24h-tab (Metoprolol succinate) .Marland Kitchen.. 1 once daily 5)  Actos 30 Mg Tabs (Pioglitazone hcl) .... Take 1 tablet by mouth once a day 6)  Zolpidem Tartrate 10 Mg Tabs (Zolpidem tartrate) .... 1/2 or 1 by mouth at hs as needed 7)  Vitamin D3 1000 Unit Tabs (Cholecalciferol) .Marland Kitchen.. 1 by mouth daily 8)  Temazepam 30 Mg Caps (Temazepam) .Marland Kitchen.. 1 at bedtime 9)  Benazepril-hydrochlorothiazide 10-12.5 Mg Tabs (Benazepril-hydrochlorothiazide) .Marland Kitchen.. 1 by mouth qd 10)  Omeprazole 40 Mg Cpdr (Omeprazole) .Marland Kitchen.. 1 by mouth two times a day  Patient Instructions: 1)  Please schedule a follow-up appointment in 3 months well w/labs. 2)  Take Benazepril/HCT 1/2 a day 3)  Drink more water!

## 2010-12-04 NOTE — Assessment & Plan Note (Signed)
Summary: 2 week follow up--[name]   Vital Signs:  Patient profile:   70 year old male Height:      69 inches Weight:      167 pounds BMI:     24.75 Temp:     98.0 degrees F oral Pulse rate:   88 / minute Pulse rhythm:   regular Resp:     16 per minute BP sitting:   148 / 90  (left arm) Cuff size:   regular  Vitals Entered By: Lanier Prude, CMA(AAMA) (November 28, 2010 10:59 AM) CC: 2 wk f/u Is Patient Diabetic? Yes Comments pt is not taking Alprazolam.  He needs generic substitute for Lipitor.  Pt is still currently taking Acyclovir that Dr. Yetta Barre gave him.   Primary Care Provider:  Dezmon Conover  CC:  2 wk f/u.  History of Present Illness: F/u shingles. C/o pain in L chest off and on  Current Medications (verified): 1)  Paroxetine Hcl 20 Mg Tabs (Paroxetine Hcl) .... Take 1 Tablet By Mouth Once A Day 2)  Aspir-Low 81 Mg Tbec (Aspirin) .... Take 1 Tablet By Mouth Once A Day 3)  Vitamin D3 1000 Unit  Tabs (Cholecalciferol) .Marland Kitchen.. 1 By Mouth Daily 4)  Omeprazole 40 Mg Cpdr (Omeprazole) .Marland Kitchen.. 1 By Mouth Two Times A Day 5)  Sodium Bicarbonate 650 Mg Tabs (Sodium Bicarbonate) .... 2 Once Daily 6)  Alprazolam 0.5 Mg Tabs (Alprazolam) .Marland Kitchen.. 1 By Mouth Two Times A Day As Needed Anxiety 7)  Maxzide-25 37.5-25 Mg Tabs (Triamterene-Hctz) .Marland Kitchen.. 1 By Mouth Q Am 8)  Labetalol Hcl 100 Mg Tabs (Labetalol Hcl) .... 2 By Mouth Once Daily 9)  Buspirone Hcl 15 Mg Tabs (Buspirone Hcl) .... Start With 1/2 Tab Two Times A Day  For Anxiety 10)  Temazepam 15 Mg Caps (Temazepam) .Marland Kitchen.. 1-2 By Mouth At Bedtime As Needed Insomnia 11)  Lipitor 40 Mg Tabs (Atorvastatin Calcium) .Marland Kitchen.. 1 By Mouth Once Daily For Cholesterol 12)  Glimepiride 1 Mg Tabs (Glimepiride) .Marland Kitchen.. 1 By Mouth Qd 13)  Lyrica 75 Mg Caps (Pregabalin) .... One By Mouth Three Times A Day As Needed For Pain  Allergies (verified): 1)  Pcn 2)  Amlodipine Besylate 3)  Lotensin 4)  Diovan  Physical Exam  General:  alert, well-developed,  well-nourished, well-hydrated, appropriate dress, normal appearance, healthy-appearing, cooperative to examination, and good hygiene.   Mouth:  Oral mucosa and oropharynx without lesions or exudates.  Teeth in good repair. Lungs:  CTA Heart:  normal rate, regular rhythm, no murmur, no gallop, no rub, and no JVD.   Abdomen:  soft, non-tender, normal bowel sounds, no distention, no masses, no guarding, no rigidity, no rebound tenderness, no abdominal hernia, no inguinal hernia, no hepatomegaly, and no splenomegaly.   Skin:  over the left posterior back and across the flank and abd he has a group of  small vesicles on an erythematous base - the classic dew drops on rose pedals in a dermatomal pattern - drying up. he also has diffuse sk's.   Impression & Recommendations:  Problem # 1:  SHINGLES (ICD-053.9) Assessment Improved  Problem # 2:  CHEST WALL PAIN, ACUTE (ICD-786.52) L Assessment: New Tramadol prn His updated medication list for this problem includes:    Aspir-low 81 Mg Tbec (Aspirin) .Marland Kitchen... Take 1 tablet by mouth once a day  Problem # 3:  HYPERLIPIDEMIA (ICD-272.4) Assessment: Comment Only  The following medications were removed from the medication list:    Lipitor 40 Mg Tabs (Atorvastatin  calcium) .Marland Kitchen... 1 by mouth once daily for cholesterol - he wants to be switched to a less expensive med His updated medication list for this problem includes:    Simvastatin 40 Mg Tabs (Simvastatin) .Marland Kitchen... 1 by mouth qd  Problem # 4:  SKIN CANCER, HX OF (ICD-V10.83) recent Assessment: New Healing well. Path result discussed w/pt  Complete Medication List: 1)  Paroxetine Hcl 20 Mg Tabs (Paroxetine hcl) .... Take 1 tablet by mouth once a day 2)  Aspir-low 81 Mg Tbec (Aspirin) .... Take 1 tablet by mouth once a day 3)  Vitamin D3 1000 Unit Tabs (Cholecalciferol) .Marland Kitchen.. 1 by mouth daily 4)  Omeprazole 40 Mg Cpdr (Omeprazole) .Marland Kitchen.. 1 by mouth two times a day 5)  Sodium Bicarbonate 650 Mg Tabs (Sodium  bicarbonate) .... 2 once daily 6)  Alprazolam 0.5 Mg Tabs (Alprazolam) .Marland Kitchen.. 1 by mouth two times a day as needed anxiety 7)  Maxzide-25 37.5-25 Mg Tabs (Triamterene-hctz) .Marland Kitchen.. 1 by mouth q am 8)  Labetalol Hcl 100 Mg Tabs (Labetalol hcl) .... 2 by mouth once daily 9)  Buspirone Hcl 15 Mg Tabs (Buspirone hcl) .... Start with 1/2 tab two times a day  for anxiety 10)  Temazepam 15 Mg Caps (Temazepam) .Marland Kitchen.. 1-2 by mouth at bedtime as needed insomnia 11)  Glimepiride 1 Mg Tabs (Glimepiride) .Marland Kitchen.. 1 by mouth qd 12)  Tramadol Hcl 50 Mg Tabs (Tramadol hcl) .Marland Kitchen.. 1-2 tabs by mouth two times a day as needed pain 13)  Simvastatin 40 Mg Tabs (Simvastatin) .Marland Kitchen.. 1 by mouth qd  Patient Instructions: 1)  Keep return office visit  2)  BMP prior to visit, ICD-9: 3)  Hepatic Panel prior to visit, ICD-9: 4)  Lipid Panel prior to visit, ICD-9:272.0 995.20 Prescriptions: SIMVASTATIN 40 MG TABS (SIMVASTATIN) 1 by mouth qd  #90 x 3   Entered and Authorized by:   Tresa Garter MD   Signed by:   Tresa Garter MD on 11/28/2010   Method used:   Print then Give to Patient   RxID:   [id number] TRAMADOL HCL 50 MG TABS (TRAMADOL HCL) 1-2 tabs by mouth two times a day as needed pain  #100 x 1   Entered and Authorized by:   Tresa Garter MD   Signed by:   Tresa Garter MD on 11/28/2010   Method used:   Electronically to        [facility]  [address] [phone]* (retail)       [address]       [place], [place]  [address]       Ph: [phone]       Fax: [phone]   RxID:   [id number]    Orders Added: 1)  Est. Patient Level IV [33295]

## 2010-12-04 NOTE — Progress Notes (Signed)
  Phone Note Call from Patient Call back at Home Phone [phone]   Caller: [phone] Call For: Tresa Garter MD Summary of Call: Pt has questions on how to take pain meds, request to talk to a nurse. Initial call taken by: Verdell Face,  November 14, 2010 10:29 AM  Follow-up for Phone Call        Pt advised of directions for Lyrica samples recieved at [facility] today Follow-up by: [name] Pyle, CMA,  November 14, 2010 10:47 AM

## 2010-12-04 NOTE — Consult Note (Signed)
Summary: [facility]   Imported By: Sherian Rein 02/26/2010 08:29:46  _____________________________________________________________________  External Attachment:    Type:   Image     Comment:   External Document

## 2010-12-04 NOTE — Progress Notes (Signed)
Summary: Alternative for Lorazepam  Phone Note Call from Patient   Caller: Patient Summary of Call: pt needs cheaper alt for lorazepam.  He has checked with various pharmacies and all will be expensive.  please advise Initial call taken by: Lanier Prude, [facility]),  July 10, 2010 11:10 AM  Follow-up for Phone Call        what is covered? is Xanax less expensive/ Follow-up by: Tresa Garter MD,  July 10, 2010 12:48 PM  Additional Follow-up for Phone Call Additional follow up Details #1::        There are no covered medication in class of drugs thru pt's insurance plan. I spoke with pt's pharmacy. supply cost is around 82$ which is too expensive for pt.  Per pharm - alt could be Xanax 1mg  with directions 1/4 to 1/2 tab bid #60 seems to be the least expensive at 20 dollars which should last more than a month depending on directions. Please advise.  Additional Follow-up by: Lamar Sprinkles, CMA,  July 10, 2010 3:59 PM    Additional Follow-up for Phone Call Additional follow up Details #2::    OK Xanax Follow-up by: Tresa Garter MD,  July 10, 2010 10:43 PM  Additional Follow-up for Phone Call Additional follow up Details #3:: Details for Additional Follow-up Action Taken: Pt informed, BUT he found cheaper price for previous med. He got a deal on lorazepam #60 for $16. He will stay on lorazepam............Marland KitchenLamar Sprinkles, CMA  July 11, 2010 10:28 AM   Noted. OK w/me Additional Follow-up by: Tresa Garter MD,  July 11, 2010 1:18 PM  New/Updated Medications: ALPRAZOLAM 1 MG TABS (ALPRAZOLAM) 1/4 or 1/2 by mouth two times a day as needed anxiety LORAZEPAM 0.5 MG TABS (LORAZEPAM) 1 two times a day as needed Prescriptions: ALPRAZOLAM 1 MG TABS (ALPRAZOLAM) 1/4 or 1/2 by mouth two times a day as needed anxiety  #60 x 2   Entered and Authorized by:   Tresa Garter MD   Signed by:   Tresa Garter MD on 07/10/2010   Method used:    Print then Give to Patient   RxID:   [id number]

## 2010-12-04 NOTE — Letter (Signed)
Summary: Diabetic Instructions  [facility]  [address] [place], [place] [address]   Phone: [phone]  Fax: [phone]    [name] October 25, 1941 MRN: [id number]   _ x _   ORAL DIABETIC MEDICATION INSTRUCTIONS  The day before your procedure:   Take your diabetic pill as you do normally  The day of your procedure:   Do not take your diabetic pill    We will check your blood sugar levels during the admission process and again in Recovery before discharging you home  ________________________________________________________________________

## 2010-12-05 LAB — CBC
HCT: 32.3 % — ABNORMAL LOW (ref 39.0–52.0)
Hemoglobin: 10.5 g/dL — ABNORMAL LOW (ref 13.0–17.0)
MCH: 29.7 pg (ref 26.0–34.0)
MCHC: 32.5 g/dL (ref 30.0–36.0)
MCV: 91.2 fL (ref 78.0–100.0)

## 2010-12-05 LAB — COMPREHENSIVE METABOLIC PANEL
ALT: 14 U/L (ref 0–53)
CO2: 18 mEq/L — ABNORMAL LOW (ref 19–32)
Calcium: 8.8 mg/dL (ref 8.4–10.5)
Creatinine, Ser: 2.85 mg/dL — ABNORMAL HIGH (ref 0.4–1.5)
GFR calc non Af Amer: 22 mL/min — ABNORMAL LOW (ref >60)
Glucose, Bld: 78 mg/dL (ref 70–99)
Sodium: 138 mEq/L (ref 135–145)

## 2010-12-05 LAB — GLUCOSE, CAPILLARY
Glucose-Capillary: 63 mg/dL — ABNORMAL LOW (ref 70–99)
Glucose-Capillary: 113 mg/dL — ABNORMAL HIGH (ref 70–99)

## 2010-12-08 ENCOUNTER — Telehealth: Payer: Self-pay | Admitting: Internal Medicine

## 2010-12-08 LAB — GLUCOSE, CAPILLARY

## 2010-12-10 NOTE — Progress Notes (Signed)
Summary: Call   Phone Note Call from Patient   Caller: Aurea Graff - [phone] Summary of Call: Pt is currently in the hosptial. Wife is req a call back.  Initial call taken by: Lamar Sprinkles, CMA,  December 04, 2010 2:24 PM  Follow-up for Phone Call        left mess to call office back............Marland KitchenLamar Sprinkles, CMA  December 04, 2010 3:57 PM   Additional Follow-up for Phone Call Additional follow up Details #1::        Wife stated that pt now feels better and will be D/C.Marland KitchenMarland KitchenMarland KitchenAlvy Beal Archie CMA  December 05, 2010 2:41 PM

## 2010-12-10 NOTE — Progress Notes (Signed)
Summary: Tramadol ?   Phone Note Call from Patient Call back at [facility] Phone [phone]   Summary of Call: Pt was given rx for tramadol - wants to know if it has possiblity to make pt's nauseous?  Initial call taken by: Lamar Sprinkles, CMA,  December 01, 2010 12:31 PM  Follow-up for Phone Call        Yes. It should get better with time. Take less - 1/2 tab bid Follow-up by: Tresa Garter MD,  December 01, 2010 6:15 PM  Additional Follow-up for Phone Call Additional follow up Details #1::        Pt informed  Additional Follow-up by: Lamar Sprinkles, CMA,  December 01, 2010 6:44 PM

## 2010-12-15 NOTE — Discharge Summary (Signed)
NAME:  [name], [name] NO.:  [id number]  MEDICAL RECORD NO.:  [id number]           PATIENT TYPE:  O  LOCATION:  XRAY                         FACILITY:  [facility]  PHYSICIAN:  Osvaldo Shipper, MD     DATE OF BIRTH:  [birth year]  DATE OF ADMISSION:  12/03/2010 DATE OF DISCHARGE:  12/05/2010                              DISCHARGE SUMMARY   PRIMARY CARE PHYSICIAN:  Georgina Quint. Plotnikov, M.D.  CONSULTATIONS:  No consultations obtained during this admission.  IMAGING STUDIES: 1. Chest x-ray which showed emphysema without any acute disease. 2. CT of the abdomen and pelvis which showed no acute abnormalities,     uncomplicated aortic stent graft.  Left renal cyst and chronic     right renal infarct.  Patulous GE junction and small hiatal hernia     was seen.  Thickening of the gastric fundus may be due to under     distention.  Gastritis could not be excluded.  PERTINENT LABS:  When he was admitted, his white cell count was 12.6, hemoglobin 13.1.  Subsequently, white count was normal, hemoglobin was 10.5.  His BUN was 68, creatinine was 3.51.  His bicarb was 18.  His BUN is 52 and creatinine is 2.85 today.  Troponins were mildly elevated at 0.09.  CK and CK-MB were normal.  Fecal occult blood testing was negative.  DISCHARGE DIAGNOSES: 1. Nausea and vomiting, likely secondary to acute gastritis, cannot     rule out gastroparesis. 2. History of type 2 diabetes. 3. Hypertension, appears to be noncompliant. 4. Coronary artery disease, status post stent in the past, stable. 5. Diabetes with neuropathy. 6. History of anxiety disorder. 7. Chronic kidney disease stage IV.  BRIEF HOSPITAL COURSE: 1. Nausea, vomiting, and upper abdominal pain.  The patient presented     with these symptoms.  He underwent evaluation in the ED in the form     of CT.  It was felt that the patient had gastritis.  He was started     on PPI.  He was given antiemetics.  His symptoms slowly  resolved on     its own and he is tolerated the diet today.  This also could have     been gastroparesis related symptoms.  However, it is difficult to     rule that out.  So, the patient is back to his baseline. 2. Acute-on-chronic renal failure.  His BUN and creatinine were     elevated when he presented to the hospital.  He was given gentle IV     hydration and then his BUN and creatinine have improved and are     almost down to his baseline.  He does have chronic kidney disease     and is on sodium bicarbonate.  He should follow up with his PMD in     2 week's time.  He may benefit from referral to a nephrologist as     well. 3. Recent herpes zoster on the torso, has also improved. 4. History of hypertension.  It appears that he is not very compliant  with this medication.  When he came into the hospital, his blood     pressure was very high at 181/125.  His medications were resumed,     and his blood pressure is much improved. 5. Rest of his medical issues are all stable.  He has tolerated a diet     today.  Denies any other symptoms.  No pain, no nausea or vomiting. 6. Cardiac enzymes were also checked during this admission.  His     troponins were found to be 0.07, 0.08, and 0.09.  The 0.07 was the     last value.  The patient denied any chest pain or shortness of     breath.  His EKG did not show any acute ischemic changes.  It is     felt that the elevation in troponin is probably from his renal     failure, so no further evaluation was done at this time.  PHYSICAL EXAMINATION:  VITAL SIGNS:  Today show that his temperature is 98.2, heart rate 62, respiratory rate 18, blood pressure 152/71, and saturation 96% on room air. LUNGS:  Clear to auscultation bilaterally with no wheezing, rales, or rhonchi. CARDIOVASCULAR:  S1-S2 normal regular.  No S3-S4, rubs, murmurs, or bruits. ABDOMEN:  Soft, nontender, and nondistended. NEUROLOGIC:  He is alert and oriented x3.  No focal  neurological deficits are present.  LAB DATA:  As discussed above.  ASSESSMENT/PLAN:  As per above.  So basically, the patient is back to his normal.  His BUN still needs to come down but he will be asked to maintain oral hydration at home and follow up with his PMD within 2 weeks' time.  He may benefit from a repeat BMET check at that time and would also recommend to the PCP to consider referral to a nephrologist if this has not been done in the past.  DISCHARGE MEDICATIONS: 1. Alprazolam 0.5 mg twice daily as needed for anxiety. 2. Aspirin 81 mg p.o. daily. 3. Buspirone 15 mg half tablet p.o. b.i.d. 4. Amaryl 1 mg p.o. daily. 5. Labetalol 100 mg p.o. b.i.d. 6. Lipitor 40 mg p.o. daily. 7. Lyrica 75 mg t.i.d. 8. Omeprazole 40 mg p.o. b.i.d. 9. Paroxetine 20 mg p.o. daily. 10.Sodium bicarbonate 1 tablet p.o. b.i.d. 11.Temazepam 15 mg 1-2 capsules at bedtime as needed for insomnia. 12.Triamterene/hydrochlorothiazide 37.5/25 one tablet every morning. 13.Vitamin D3 is 1000 units daily.  FOLLOWUP:  With his primary care physician in 2 weeks, with his cardiologist at the next scheduled appointment.  He used to see Dr. Juanda Chance, but has been assigned to another [facility] cardiologist.  DIET:  Modified carbohydrate.  PHYSICAL ACTIVITY:  As before.  TOTAL TIME ON THIS DISCHARGE ENCOUNTER:  35 minutes.   Osvaldo Shipper, MD     GK/MEDQ  D:  12/05/2010  T:  12/05/2010  Job:  [id number]  cc:   Georgina Quint. Plotnikov, MD [name] [place] [address]  Electronically Signed by Osvaldo Shipper MD on 12/15/2010 07:31:04 PM

## 2010-12-18 NOTE — Progress Notes (Signed)
Summary: Call Report  Phone Note Other Incoming   Caller: [facility] Summary of Call: [facility] Triage Call Report Triage Record Num: [id number] Operator: Di Kindle Patient Name: [name] Call Date & Time: 12/06/2010 9:06:59AM Patient Phone: [phone] PCP: Sonda Primes Patient Gender: Male PCP Fax : [phone] Patient DOB: 1941/01/23 Practice Name: [facility] Reason for Call: Pt calling: states released from hosp 12/05/10, RX not there for lyrica, which he has been taking (states was given samples in the office). Caller to office for med request: Rosa. States MD in office is not PCP, will need to call 12/08/10 when PCP is in the office. Protocol(s) Used: Medication Question Calls, No Triage (Adults) Recommended Outcome per Protocol: Provide Information or Advice Only Reason for Outcome: Caller has medication question only and triager answers question Care Advice:  ~ 02/ Initial call taken by: [name] Pyle, CMA,  December 08, 2010 8:19 AM  Follow-up for Phone Call        ok to restart Lyrica Follow-up by: Tresa Garter MD,  December 08, 2010 1:43 PM  Additional Follow-up for Phone Call Additional follow up Details #1::        Is this Lyrica 75mg  three times a day ?  Yes, Thank you! Georgina Quint Plotnikov MD  December 08, 2010 4:41 PM   Additional Follow-up by: Tresa Garter MD,  December 08, 2010 4:40 PM    Additional Follow-up for Phone Call Additional follow up Details #2::    left mess to call office back..................Marland KitchenLamar Sprinkles, CMA  December 08, 2010 4:58 PM    Return call to patient on number provided, per VM phone cannot take incoming calls.Marland KitchenMarland KitchenAlvy Beal Archie CMA  December 09, 2010 11:28 AM   Pt informed  Follow-up by: Lamar Sprinkles, CMA,  December 09, 2010 5:45 PM  New/Updated Medications: LYRICA 75 MG CAPS (PREGABALIN) 1 three times a day Prescriptions: LYRICA 75 MG CAPS (PREGABALIN) 1 three times a day  #90 x 5  Entered by:   Lamar Sprinkles, CMA   Authorized by:   Tresa Garter MD   Signed by:   Lamar Sprinkles, CMA on 12/08/2010   Method used:   [facility]ph[facility] to ...       [facility]  [address] [id number]       [address]       [place], [place]  [address]       Ph: [phone]       Fax: [phone]   RxID:   [id number]

## 2010-12-22 ENCOUNTER — Encounter: Payer: Self-pay | Admitting: Internal Medicine

## 2010-12-22 ENCOUNTER — Ambulatory Visit (INDEPENDENT_AMBULATORY_CARE_PROVIDER_SITE_OTHER): Payer: MEDICARE | Admitting: Internal Medicine

## 2010-12-22 DIAGNOSIS — F29 Unspecified psychosis not due to a substance or known physiological condition: Secondary | ICD-10-CM

## 2010-12-22 DIAGNOSIS — K2900 Acute gastritis without bleeding: Secondary | ICD-10-CM

## 2010-12-22 DIAGNOSIS — J029 Acute pharyngitis, unspecified: Secondary | ICD-10-CM

## 2010-12-22 DIAGNOSIS — N259 Disorder resulting from impaired renal tubular function, unspecified: Secondary | ICD-10-CM

## 2010-12-22 DIAGNOSIS — I129 Hypertensive chronic kidney disease with stage 1 through stage 4 chronic kidney disease, or unspecified chronic kidney disease: Secondary | ICD-10-CM | POA: Insufficient documentation

## 2010-12-24 NOTE — H&P (Signed)
NAME:  [name], [name] NO.:  [id number]  MEDICAL RECORD NO.:  [id number]           PATIENT TYPE:  I  LOCATION:  0101                         FACILITY:  [facility]  PHYSICIAN:  Massie Maroon, MD        DATE OF BIRTH:  01-21-1941  DATE OF ADMISSION:  12/03/2010 DATE OF DISCHARGE:                             HISTORY & PHYSICAL   CHIEF COMPLAINT:  I felt sick to my stomach.  HISTORY OF PRESENT ILLNESS:  A 70 year old male with history of diabetes type 2, hypertension, and chronic kidney disease stage 4, abdominal aortic aneurysm, CAD status post MI, shingles apparently treated with acyclovir starting about November 12, 2010 for painful rash across his left back, flank/abdomen, apparently presents with complaints of feeling sick to his stomach and nausea.  He denies any vomiting.  The patient is not able to describe the discomfort over his epigastric area.  The patient denies any fever, chills, diarrhea, bright red blood per rectum, or black stool.  The patient will be admitted for workup of abdominal discomfort and nausea.  PAST MEDICAL HISTORY: 1. Type 2 diabetes. 2. Hypertension. 3. Hyperlipidemia. 4. CAD status post a bare metal stent to the right coronary artery in     1998. 5. PVD. 6. 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 7. GERD. 8. Barrett's esophagus. 9. COPD. 10.Chronic kidney disease in 2010, stage 4. 11.Anxiety/depression.  PAST SURGICAL HISTORY: 1. Tonsillectomy. 2. Nissen fundoplication. 3. Infrarenal abdominal aortic aneurysm, right common iliac artery     aneurysm status post endovascular aneurysm repair in 2010.  SOCIAL HISTORY:  The patient is a retired Loss adjuster, chartered.  He is living at home with his wife.  He is married and has three children.  He has a history of tobacco use and is a current smoker.  He does not drink.  He is a former [place]  veteran.  FAMILY HISTORY:  Positive for heart disease and diabetes.  ALLERGIES: 1. PENICILLIN. 2. AMLODIPINE. 3. LOTENSIN. 4. DIOVAN.  MEDICATIONS: 1. Paxil 20 mg p.o. daily. 2. Enteric-coated aspirin 81 mg p.o. daily. 3. Vitamin D 3000 international units p.o. daily. 4. Omeprazole 40 mg p.o. b.i.d. 5. Sodium bicarbonate 650 mg two p.o. daily. 6. Xanax 0.5 mg one p.o. b.i.d. p.r.n. anxiety. 7. Maxzide 37.5/25 mg one p.o. q.a.m. 8. Labetalol 100 mg p.o. b.i.d. 9. Buspirone 15 mg one half p.o. b.i.d. p.r.n. anxiety. 10.Temazepam 15 mg one to two p.o. q.h.s. p.r.n. insomnia. 11.Glimepiride 1 mg p.o. daily. 12.Tramadol 50 mg one to two p.o. b.i.d. p.r.n. pain. 13.Simvastatin 40 mg p.o. daily.  REVIEW OF SYSTEMS:  Negative for all 10 organ systems except for pertinent positives stated above.  PHYSICAL EXAMINATION:  VITAL SIGNS:  Temperature 98.1; pulse 114, repeat 82; blood pressure 181/125, pulse ox 99% on room air. HEENT:  Anicteric, EOMI, no nystagmus, pupils 1.5 mm, symmetric. Direct, consensual, and near reflexes intact.  Mucous membranes moist. NECK:  No JVD, no bruit. HEART:  Regular rate and rhythm.  S1-S2.  No murmurs, gallops, or  rubs. LUNGS:  Clear to auscultation bilaterally. ABDOMEN:  Soft, nontender, nondistended.  Positive bowel sounds. EXTREMITIES:  No cyanosis, clubbing, or edema. SKIN:  Residual shingles over the left flank. LYMPH NODES:  No adenopathy. NEURO:  Nonfocal, cranial nerves II through XII intact.  Reflexes 2+, symmetric, diffuse with downgoing toes bilaterally, motor strength 5/5 in all four extremities, pinprick intact.  LABORATORY DATA:  Hemoccult stool negative.  WBC 12.6, hemoglobin 13.1, platelet count 239.  Urinalysis negative other than protein 100.  Sodium 138, potassium 4.7, BUN 68, creatinine 3.51, AST 24, ALT 15, alk phos 93, total bilirubin 1.1, lipase 31.  CPK 157, MB 1.8, troponin-I 0.09.  ASSESSMENT/PLAN: 1. Abdominal  discomfort:  Possibly secondary to gastritis.  Continue     Protonix 40 mg p.o. b.i.d. 2. Nausea:  Possibly secondary to gastritis versus the fact that he     has worsening renal insufficiency and this may be secondary to     uremia. 3. Hypertensive urgency:  Continue labetalol, hydralazine 10 mg IV q.6     h p.r.n. systolic blood pressure greater than 160. 4. Coronary artery disease status post stent:  Continue Lipitor,     continue labetalol, continue aspirin. 5. Diabetes with neuropathy:  Continue glimepiride, continue Lyrica. 6. Anxiety:  Continue Xanax, continue buspirone, continue temazepam. 7. Hypertension:  Continue Maxzide along with labetalol. 8. Chronic kidney disease stage 4:  Continue sodium bicarbonate. 9. Deep venous thrombosis prophylaxis:  SCDs.     Massie Maroon, MD     JYK/MEDQ  D:  12/04/2010  T:  12/04/2010  Job:  [id number]  cc:   Georgina Quint. Plotnikov, MD [address] [place] [address]  Electronically Signed by Pearson Grippe MD on 12/24/2010 01:21:04 PM

## 2010-12-24 NOTE — Letter (Signed)
[facility]   Imported By: Sherian Rein 12/17/2010 09:40:42  _____________________________________________________________________  External Attachment:    Type:   Image     Comment:   External Document

## 2010-12-25 ENCOUNTER — Telehealth: Payer: Self-pay | Admitting: Internal Medicine

## 2010-12-30 NOTE — Assessment & Plan Note (Signed)
[facility]   Vital Signs:  Patient profile:   70 year old male Height:      69 inches Weight:      174 pounds BMI:     25.79 Temp:     97.7 degrees F oral Pulse rate:   76 / minute Pulse rhythm:   regular Resp:     16 per minute BP sitting:   160 / 100  (left arm) Cuff size:   regular  Vitals Entered By: Lanier Prude, Beverly Gust) (December 22, 2010 8:07 AM) CC: Hospital f/u  Is Patient Diabetic? Yes Comments pt is not taking Tramadol    Primary Care Provider:  Plotnikov  CC:  Hospital f/u .  History of Present Illness: The patient presents for a post-hospital visit for  DISCHARGE DIAGNOSES: 1. Nausea and vomiting, likely secondary to acute gastritis, cannot     rule out of gastroparesis. 2. History of type 2 diabetes. 3. Hypertension, appears to be noncompliant. 4. Coronary artery disease, status post stent in the past, stable. 5. Diabetes with neuropathy. 6. History of anxiety disorder. 7. Chronic kidney disease stage   C/o ST x 1 wk  Current Medications (verified): 1)  Paroxetine Hcl 20 Mg Tabs (Paroxetine Hcl) .... Take 1 Tablet By Mouth Once A Day 2)  Aspir-Low 81 Mg Tbec (Aspirin) .... Take 1 Tablet By Mouth Once A Day 3)  Vitamin D3 1000 Unit  Tabs (Cholecalciferol) .Marland Kitchen.. 1 By Mouth Daily 4)  Omeprazole 40 Mg Cpdr (Omeprazole) .Marland Kitchen.. 1 By Mouth Two Times A Day 5)  Sodium Bicarbonate 650 Mg Tabs (Sodium Bicarbonate) .... 2 Once Daily 6)  Alprazolam 0.5 Mg Tabs (Alprazolam) .Marland Kitchen.. 1 By Mouth Two Times A Day As Needed Anxiety 7)  Maxzide-25 37.5-25 Mg Tabs (Triamterene-Hctz) .Marland Kitchen.. 1 By Mouth Q Am 8)  Labetalol Hcl 100 Mg Tabs (Labetalol Hcl) .... 2 By Mouth Once Daily 9)  Buspirone Hcl 15 Mg Tabs (Buspirone Hcl) .... Start With 1/2 Tab Two Times A Day  For Anxiety 10)  Temazepam 15 Mg Caps (Temazepam) .Marland Kitchen.. 1-2 By Mouth At Bedtime As Needed Insomnia 11)  Glimepiride 1 Mg Tabs (Glimepiride) .Marland Kitchen.. 1 By Mouth Qd 12)  Tramadol Hcl 50 Mg Tabs (Tramadol Hcl)  .Marland Kitchen.. 1-2 Tabs By Mouth Two Times A Day As Needed Pain 13)  Simvastatin 40 Mg Tabs (Simvastatin) .Marland Kitchen.. 1 By Mouth Qd 14)  Lyrica 75 Mg Caps (Pregabalin) .Marland Kitchen.. 1 Three Times A Day  Allergies (verified): 1)  Pcn 2)  Amlodipine Besylate 3)  Lotensin 4)  Diovan  Past History:  Past Medical History: Last updated: 09/19/2010 Anxiety COPD Diabetes mellitus, type II Hyperlipidemia Hypertension GERD  Barrett's Peripheral vascular disease  Infrarenal abdominal aortic aneurysm and right common iliac artery     aneurysm, status post endovascular aneurysm repair. Coronary artery disease status post bare-metal stent to the right     coronary in 1998 7. Continued cigarette use.  Renal insufficiency 2010 Depression  Social History: Last updated: 10/09/2009 Retired Retail banker person Married Regular exercise-no Current Smoker  Review of Systems  The patient denies fever, weight loss, weight gain, chest pain, syncope, dyspnea on exertion, abdominal pain, hematochezia, and severe indigestion/heartburn.    Physical Exam  General:  alert, well-developed, well-nourished, well-hydrated, appropriate dress, normal appearance, healthy-appearing, cooperative to examination, and good hygiene.   Eyes:  vision grossly intact, pupils equal, and no injection.   Nose:  nasal discharge, no mucosal pallor.   Mouth:  Oral mucosa and  oropharynx without lesions or exudates.  Teeth in good repair. Neck:  supple, full ROM, no masses, no thyromegaly, no thyroid nodules or tenderness, no JVD, normal carotid upstroke, and no cervical lymphadenopathy.   Lungs:  CTA Heart:  normal rate, regular rhythm, no murmur, no gallop, no rub, and no JVD.   Abdomen:  soft, non-tender, normal bowel sounds, no distention, no masses, no guarding, no rigidity, no rebound tenderness, no abdominal hernia, no inguinal hernia, no hepatomegaly, and no splenomegaly.   Msk:  No deformity or scoliosis noted of thoracic or lumbar spine.     Extremities:  No clubbing, cyanosis, edema, or deformity noted with normal full range of motion of all joints.   Neurologic:  No cranial nerve deficits noted. Station and gait are normal. Plantar reflexes are down-going bilaterally. DTRs are symmetrical throughout. Sensory, motor and coordinative functions appear intact. Skin:  over the left posterior back and across the flank and abd he has a group of  small vesicles on an erythematous base - the classic dew drops on rose pedals in a dermatomal pattern - drying up. he also has diffuse sk's. Cervical Nodes:  no anterior cervical adenopathy and no posterior cervical adenopathy.   Psych:  Cognition and judgment appear intact. Alert and cooperative with normal attention span and concentration. No apparent delusions, illusions, hallucinations   Impression & Recommendations:  Problem # 1:  GASTRITIS, ACUTE (ICD-535.00) Assessment Improved  His updated medication list for this problem includes:    Omeprazole 40 Mg Cpdr (Omeprazole) .Marland Kitchen... 1 by mouth two times a day    Sodium Bicarbonate 650 Mg Tabs (Sodium bicarbonate) .Marland Kitchen... 2 once daily  Problem # 2:  PHARYNGITIS, ACUTE (ICD-462) poss due to candidiasis Assessment: New  Diflucan: possible yeast infection. Halls to use.  Problem # 3:  CONFUSION (ICD-298.9) due to illness. It may be mild underlying memory loss Assessment: Improved  Problem # 4:  SHINGLES (ICD-053.9) Assessment: Improved  Problem # 5:  RENAL INSUFFICIENCY (ICD-588.9) Assessment: Unchanged  Problem # 6:  HTN CKD UNS W/CKD STAGE I THRU STAGE IV/UNS (ICD-403.90) Assessment: Unchanged  The following medications were removed from the medication list:    Labetalol Hcl 100 Mg Tabs (Labetalol hcl) .Marland Kitchen... 2 by mouth once daily His updated medication list for this problem includes:    Maxzide-25 37.5-25 Mg Tabs (Triamterene-hctz) .Marland Kitchen... 1 by mouth q am    Labetalol Hcl 300 Mg Tabs (Labetalol hcl) .Marland Kitchen... 1 by mouth bid  Complete  Medication List: 1)  Paroxetine Hcl 20 Mg Tabs (Paroxetine hcl) .... Take 1 tablet by mouth once a day 2)  Aspir-low 81 Mg Tbec (Aspirin) .... Take 1 tablet by mouth once a day 3)  Vitamin D3 1000 Unit Tabs (Cholecalciferol) .Marland Kitchen.. 1 by mouth daily 4)  Omeprazole 40 Mg Cpdr (Omeprazole) .Marland Kitchen.. 1 by mouth two times a day 5)  Sodium Bicarbonate 650 Mg Tabs (Sodium bicarbonate) .... 2 once daily 6)  Alprazolam 0.5 Mg Tabs (Alprazolam) .Marland Kitchen.. 1 by mouth two times a day as needed anxiety 7)  Maxzide-25 37.5-25 Mg Tabs (Triamterene-hctz) .Marland Kitchen.. 1 by mouth q am 8)  Buspirone Hcl 15 Mg Tabs (Buspirone hcl) .... Start with 1/2 tab two times a day  for anxiety 9)  Temazepam 15 Mg Caps (Temazepam) .Marland Kitchen.. 1-2 by mouth at bedtime as needed insomnia 10)  Glimepiride 1 Mg Tabs (Glimepiride) .Marland Kitchen.. 1 by mouth qd 11)  Tramadol Hcl 50 Mg Tabs (Tramadol hcl) .Marland Kitchen.. 1-2 tabs by mouth two times  a day as needed pain 12)  Simvastatin 40 Mg Tabs (Simvastatin) .Marland Kitchen.. 1 by mouth qd 13)  Lyrica 75 Mg Caps (Pregabalin) .Marland Kitchen.. 1 three times a day 14)  Diflucan 100 Mg Tabs (Fluconazole) .... Take two tablets on the first day, than  1 by mouth once daily untill gone for a fungul infection 15)  Labetalol Hcl 300 Mg Tabs (Labetalol hcl) .Marland Kitchen.. 1 by mouth bid  Patient Instructions: 1)  Please schedule a follow-up appointment in 1 month. 2)  Call if you are not better in a reasonable amount of time or if worse.  Prescriptions: LABETALOL HCL 300 MG TABS (LABETALOL HCL) 1 by mouth bid  #60 x 11   Entered and Authorized by:   Tresa Garter MD   Signed by:   Tresa Garter MD on 12/22/2010   Method used:   Print then Give to Patient   RxID:   [id number] DIFLUCAN 100 MG TABS (FLUCONAZOLE) Take two tablets on the first day, than  1 by mouth once daily untill gone for a fungul infection  #11 x 1   Entered and Authorized by:   Tresa Garter MD   Signed by:   Tresa Garter MD on 12/22/2010   Method used:   Print then Give  to Patient   RxID:   [id number]    Orders Added: 1)  Est. Patient Level IV [84696]

## 2011-01-08 NOTE — Progress Notes (Signed)
Summary: SLEEP MED  Phone Note Call from Patient Call back at Home Phone [phone]   Summary of Call: Ambien and temazepam both have not helped w/sleep. Patient is requesting alternative.  Initial call taken by: Lamar Sprinkles, CMA,  December 25, 2010 2:44 PM  Follow-up for Phone Call        Try Risperdone Follow-up by: Tresa Garter MD,  December 29, 2010 12:02 AM  Additional Follow-up for Phone Call Additional follow up Details #1::        Pt informed  Additional Follow-up by: Lamar Sprinkles, CMA,  December 29, 2010 11:31 AM    New/Updated Medications: RISPERIDONE 2 MG TABS (RISPERIDONE) 1 by mouth at bedtime Prescriptions: RISPERIDONE 2 MG TABS (RISPERIDONE) 1 by mouth at bedtime  #30 x 1   Entered and Authorized by:   Tresa Garter MD   Signed by:   Lamar Sprinkles, CMA on 12/29/2010   Method used:   Electronically to        [facility]  [address] [phone] (retail)       [address]       [place], [place]  [address]       Ph: [phone]       Fax: [phone]   RxID:   [id number]

## 2011-01-19 ENCOUNTER — Telehealth: Payer: Self-pay | Admitting: Internal Medicine

## 2011-01-19 LAB — GLUCOSE, CAPILLARY: Glucose-Capillary: 109 mg/dL — ABNORMAL HIGH (ref 70–99)

## 2011-01-21 LAB — GLUCOSE, CAPILLARY: Glucose-Capillary: 87 mg/dL (ref 70–99)

## 2011-01-29 NOTE — Progress Notes (Signed)
Summary: side effects  Phone Note Call from Patient Call back at Home Phone [phone]   Caller: Patient Summary of Call: Pt states that the Risperidone 2mg . that he was given for sleep is causing side effects. Pt states that he is having loss of motor skills; that he cannot make it to the bathroom when he gets up in the middle of the night and when he wakes up in the morning he "stumbles around like a drunk". Please advise. Initial call taken by: Burnard Leigh [facility]),  January 19, 2011 9:34 AM  Follow-up for Phone Call        stop the med pls try Nortrypt in a couple days Follow-up by: Tresa Garter MD,  January 19, 2011 6:16 PM  Additional Follow-up for Phone Call Additional follow up Details #1::        Pt informed. Rx sent. Additional Follow-up by: Burnard Leigh [facility]),  January 19, 2011 6:27 PM   New Allergies: ! RISPERDAL (RISPERIDONE) New/Updated Medications: NORTRIPTYLINE HCL 10 MG CAPS (NORTRIPTYLINE HCL) 1-2 by mouth qhs New Allergies: ! RISPERDAL (RISPERIDONE)Prescriptions: NORTRIPTYLINE HCL 10 MG CAPS (NORTRIPTYLINE HCL) 1-2 by mouth qhs  #60 x 6   Entered and Authorized by:   Tresa Garter MD   Signed by:   Burnard Leigh CMA(AAMA) on 01/19/2011   Method used:   [facility] to        [facility]  [address] [phone]* (retail)       [address]       [place], [place]  [address]       Ph: [phone]       Fax: [phone]   RxID:   [id number]

## 2011-02-09 ENCOUNTER — Other Ambulatory Visit: Payer: Self-pay | Admitting: Internal Medicine

## 2011-02-09 DIAGNOSIS — E785 Hyperlipidemia, unspecified: Secondary | ICD-10-CM

## 2011-02-09 DIAGNOSIS — T887XXA Unspecified adverse effect of drug or medicament, initial encounter: Secondary | ICD-10-CM

## 2011-02-10 ENCOUNTER — Other Ambulatory Visit (INDEPENDENT_AMBULATORY_CARE_PROVIDER_SITE_OTHER): Payer: MEDICARE

## 2011-02-10 DIAGNOSIS — E785 Hyperlipidemia, unspecified: Secondary | ICD-10-CM

## 2011-02-10 DIAGNOSIS — T887XXA Unspecified adverse effect of drug or medicament, initial encounter: Secondary | ICD-10-CM

## 2011-02-10 DIAGNOSIS — E119 Type 2 diabetes mellitus without complications: Secondary | ICD-10-CM

## 2011-02-10 LAB — LIPID PANEL
Cholesterol: 170 mg/dL (ref 0–200)
HDL: 45.9 mg/dL (ref >39.00)
LDL Cholesterol: 90 mg/dL (ref 0–99)
Total CHOL/HDL Ratio: 4
Triglycerides: 170 mg/dL — ABNORMAL HIGH (ref 0.0–149.0)

## 2011-02-10 LAB — BASIC METABOLIC PANEL
BUN: 32 mg/dL — ABNORMAL HIGH (ref 6–23)
CO2: 25 mEq/L (ref 19–32)
Calcium: 9 mg/dL (ref 8.4–10.5)
Chloride: 106 mEq/L (ref 96–112)
Creatinine, Ser: 2.8 mg/dL — ABNORMAL HIGH (ref 0.4–1.5)

## 2011-02-12 LAB — CBC
HCT: 27 % — ABNORMAL LOW (ref 39.0–52.0)
HCT: 27.6 % — ABNORMAL LOW (ref 39.0–52.0)
HCT: 30.8 % — ABNORMAL LOW (ref 39.0–52.0)
Hemoglobin: 10.2 g/dL — ABNORMAL LOW (ref 13.0–17.0)
Hemoglobin: 10.6 g/dL — ABNORMAL LOW (ref 13.0–17.0)
Hemoglobin: 13.2 g/dL (ref 13.0–17.0)
Hemoglobin: 9.6 g/dL — ABNORMAL LOW (ref 13.0–17.0)
MCHC: 34 g/dL (ref 30.0–36.0)
MCHC: 34.1 g/dL (ref 30.0–36.0)
MCHC: 34.3 g/dL (ref 30.0–36.0)
MCHC: 34.9 g/dL (ref 30.0–36.0)
MCV: 89.2 fL (ref 78.0–100.0)
MCV: 89.2 fL (ref 78.0–100.0)
MCV: 90.2 fL (ref 78.0–100.0)
MCV: 90.4 fL (ref 78.0–100.0)
Platelets: 150 10*3/uL (ref 150–400)
Platelets: 151 10*3/uL (ref 150–400)
RBC: 3.09 MIL/uL — ABNORMAL LOW (ref 4.22–5.81)
RBC: 3.33 MIL/uL — ABNORMAL LOW (ref 4.22–5.81)
RBC: 3.48 MIL/uL — ABNORMAL LOW (ref 4.22–5.81)
RBC: 4.3 MIL/uL (ref 4.22–5.81)
RDW: 14.5 % (ref 11.5–15.5)
RDW: 14.6 % (ref 11.5–15.5)
RDW: 14.7 % (ref 11.5–15.5)
RDW: 15.4 % (ref 11.5–15.5)
WBC: 14.2 10*3/uL — ABNORMAL HIGH (ref 4.0–10.5)
WBC: 15.5 10*3/uL — ABNORMAL HIGH (ref 4.0–10.5)
WBC: 17.3 10*3/uL — ABNORMAL HIGH (ref 4.0–10.5)
WBC: 20.9 10*3/uL — ABNORMAL HIGH (ref 4.0–10.5)

## 2011-02-12 LAB — COMPREHENSIVE METABOLIC PANEL
ALT: 25 U/L (ref 0–53)
ALT: 37 U/L (ref 0–53)
AST: 41 U/L — ABNORMAL HIGH (ref 0–37)
Albumin: 3.2 g/dL — ABNORMAL LOW (ref 3.5–5.2)
BUN: 18 mg/dL (ref 6–23)
CO2: 19 mEq/L (ref 19–32)
CO2: 20 mEq/L (ref 19–32)
Calcium: 9.2 mg/dL (ref 8.4–10.5)
Chloride: 106 mEq/L (ref 96–112)
Creatinine, Ser: 1.05 mg/dL (ref 0.4–1.5)
GFR calc Af Amer: >60 mL/min (ref >60)
GFR calc non Af Amer: 57 mL/min — ABNORMAL LOW (ref >60)
GFR calc non Af Amer: >60 mL/min (ref >60)
Glucose, Bld: 110 mg/dL — ABNORMAL HIGH (ref 70–99)
Sodium: 136 mEq/L (ref 135–145)
Total Bilirubin: 0.7 mg/dL (ref 0.3–1.2)

## 2011-02-12 LAB — GLUCOSE, CAPILLARY
Glucose-Capillary: 102 mg/dL — ABNORMAL HIGH (ref 70–99)
Glucose-Capillary: 108 mg/dL — ABNORMAL HIGH (ref 70–99)
Glucose-Capillary: 113 mg/dL — ABNORMAL HIGH (ref 70–99)
Glucose-Capillary: 118 mg/dL — ABNORMAL HIGH (ref 70–99)
Glucose-Capillary: 123 mg/dL — ABNORMAL HIGH (ref 70–99)
Glucose-Capillary: 129 mg/dL — ABNORMAL HIGH (ref 70–99)
Glucose-Capillary: 129 mg/dL — ABNORMAL HIGH (ref 70–99)
Glucose-Capillary: 133 mg/dL — ABNORMAL HIGH (ref 70–99)
Glucose-Capillary: 94 mg/dL (ref 70–99)
Glucose-Capillary: 97 mg/dL (ref 70–99)
Glucose-Capillary: 98 mg/dL (ref 70–99)
Glucose-Capillary: 99 mg/dL (ref 70–99)

## 2011-02-12 LAB — URINALYSIS, ROUTINE W REFLEX MICROSCOPIC
Glucose, UA: NEGATIVE mg/dL
Ketones, ur: NEGATIVE mg/dL
Protein, ur: NEGATIVE mg/dL
Urobilinogen, UA: 0.2 mg/dL (ref 0.0–1.0)

## 2011-02-12 LAB — PROTIME-INR
INR: 0.9 (ref 0.00–1.49)
Prothrombin Time: 12 seconds (ref 11.6–15.2)

## 2011-02-12 LAB — ABO/RH: ABO/RH(D): A POS

## 2011-02-12 LAB — BLOOD GAS, ARTERIAL
Acid-base deficit: 5.9 mmol/L — ABNORMAL HIGH (ref 0.0–2.0)
Bicarbonate: 18.5 mEq/L — ABNORMAL LOW (ref 20.0–24.0)
Drawn by: 249101
FIO2: 0.21 %
O2 Content: 4 L/min
O2 Saturation: 95.1 %
Patient temperature: 98.6
pCO2 arterial: 39.5 mmHg (ref 35.0–45.0)
pH, Arterial: 7.328 — ABNORMAL LOW (ref 7.350–7.450)
pO2, Arterial: 72.8 mmHg — ABNORMAL LOW (ref 80.0–100.0)

## 2011-02-12 LAB — BASIC METABOLIC PANEL
CO2: 19 mEq/L (ref 19–32)
Calcium: 7.7 mg/dL — ABNORMAL LOW (ref 8.4–10.5)
Chloride: 106 mEq/L (ref 96–112)
GFR calc Af Amer: >60 mL/min (ref >60)
GFR calc non Af Amer: >60 mL/min (ref >60)
Glucose, Bld: 125 mg/dL — ABNORMAL HIGH (ref 70–99)
Glucose, Bld: 137 mg/dL — ABNORMAL HIGH (ref 70–99)
Potassium: 4.3 mEq/L (ref 3.5–5.1)
Sodium: 136 mEq/L (ref 135–145)
Sodium: 139 mEq/L (ref 135–145)

## 2011-02-12 LAB — AMYLASE: Amylase: 62 U/L (ref 27–131)

## 2011-02-12 LAB — APTT
aPTT: 31 seconds (ref 24–37)
aPTT: 34 seconds (ref 24–37)

## 2011-02-16 LAB — POCT I-STAT, CHEM 8
Creatinine, Ser: 1.2 mg/dL (ref 0.4–1.5)
Glucose, Bld: 118 mg/dL — ABNORMAL HIGH (ref 70–99)
HCT: 42 % (ref 39.0–52.0)
Hemoglobin: 14.3 g/dL (ref 13.0–17.0)
Potassium: 3.8 mEq/L (ref 3.5–5.1)
Sodium: 142 mEq/L (ref 135–145)
TCO2: 19 mmol/L (ref 0–100)

## 2011-02-16 LAB — GLUCOSE, CAPILLARY
Glucose-Capillary: 106 mg/dL — ABNORMAL HIGH (ref 70–99)
Glucose-Capillary: 115 mg/dL — ABNORMAL HIGH (ref 70–99)

## 2011-02-17 ENCOUNTER — Ambulatory Visit (INDEPENDENT_AMBULATORY_CARE_PROVIDER_SITE_OTHER): Payer: MEDICARE | Admitting: Internal Medicine

## 2011-02-17 ENCOUNTER — Encounter: Payer: Self-pay | Admitting: Internal Medicine

## 2011-02-17 DIAGNOSIS — E119 Type 2 diabetes mellitus without complications: Secondary | ICD-10-CM

## 2011-02-17 DIAGNOSIS — I10 Essential (primary) hypertension: Secondary | ICD-10-CM

## 2011-02-17 DIAGNOSIS — E875 Hyperkalemia: Secondary | ICD-10-CM

## 2011-02-17 DIAGNOSIS — K219 Gastro-esophageal reflux disease without esophagitis: Secondary | ICD-10-CM

## 2011-02-17 MED ORDER — ALPRAZOLAM 0.5 MG PO TABS: 0.5000 mg | ORAL_TABLET | Freq: Two times a day (BID) | ORAL | Status: DC | PRN

## 2011-02-17 NOTE — Assessment & Plan Note (Signed)
Cont meds   

## 2011-02-17 NOTE — Assessment & Plan Note (Signed)
Monitoring

## 2011-02-17 NOTE — Assessment & Plan Note (Signed)
On Rx 

## 2011-02-17 NOTE — Progress Notes (Signed)
  Subjective:    Patient ID: [name], male    DOB: September 29, 1941, 70 y.o.   MRN: [id number]  HPI   The patient presents for a follow-up of  chronic hypertension, chronic dyslipidemia, type 2 diabetes controlled with medicines. F/u anxiety.   Review of Systems  Constitutional: Negative for fatigue.  HENT: Negative for nosebleeds.   Respiratory: Negative for shortness of breath.   Genitourinary: Negative for penile swelling and testicular pain.  Musculoskeletal: Negative for joint swelling.  Psychiatric/Behavioral: Negative for dysphoric mood.       Objective:   Physical Exam  Constitutional: He is oriented to person, place, and time. He appears well-developed.  HENT:  Mouth/Throat: Oropharynx is clear and moist.  Eyes: Conjunctivae are normal. Pupils are equal, round, and reactive to light.  Neck: Normal range of motion. No JVD present. No thyromegaly present.  Cardiovascular: Normal rate, regular rhythm, normal heart sounds and intact distal pulses.  Exam reveals no gallop and no friction rub.   No murmur heard. Pulmonary/Chest: Effort normal and breath sounds normal. No respiratory distress. He has no wheezes. He has no rales. He exhibits no tenderness.  Abdominal: Soft. Bowel sounds are normal. He exhibits no distension and no mass. There is no tenderness. There is no rebound and no guarding.  Musculoskeletal: Normal range of motion. He exhibits no edema and no tenderness.  Lymphadenopathy:    He has no cervical adenopathy.  Neurological: He is alert and oriented to person, place, and time. He has normal reflexes. No cranial nerve deficit. He exhibits normal muscle tone. Coordination normal.  Skin: Skin is warm and dry. Rash (AKs, SKs) noted. No pallor.  Psychiatric: He has a normal mood and affect. His behavior is normal. Judgment and thought content normal.        Lab Results  Component Value Date   WBC 10.2 12/05/2010   HGB 10.5* 12/05/2010   HCT 32.3* 12/05/2010   PLT  186 12/05/2010   CHOL 170 02/10/2011   TRIG 170.0* 02/10/2011   HDL 45.90 02/10/2011   LDLDIRECT 92.8 04/17/2010   ALT 14 12/05/2010   AST 18 12/05/2010   NA 140 02/10/2011   K 5.1 02/10/2011   CL 106 02/10/2011   CREATININE 2.8* 02/10/2011   BUN 32* 02/10/2011   CO2 25 02/10/2011   TSH 1.23 04/17/2010   PSA 0.99 04/17/2010   INR 1.0 01/21/2009   HGBA1C 6.2 02/10/2011     Assessment & Plan:  HYPERTENSION On Rx  DIABETES MELLITUS, TYPE II On Rx  HYPERKALEMIA Monitoring  GERD Cont meds    CRF  F/u w/Dr Hyman Hopes

## 2011-03-17 NOTE — Assessment & Plan Note (Signed)
OFFICE VISIT   [name], [name]  DOB:  [birth year]                                       02/21/2009  EAVWU#:[id number]   The patient underwent aortic stent procedure 01/21/2009 at [facility]  Discharged home 01/25/2009.  He returns today with a CT scan  carried out at [facility].  This reveals the stent to be in good  position.  We does sacrifice an inferior pole right renal artery and  this resulted in a small right renal infarct which is not unexpected.   He appears generally well.  BP 148/79, pulse 92 per minute.  Abdomen  soft, nontender.  Groin incision is healing well with mild residual  edema.  Intact femoral pulses.   The patient is doing well following his recent aortic stent graft  procedure.  We will plan followup with him again in 6 months with an  ultrasound of the abdomen.   Balinda Quails, M.D.  Electronically Signed   PGH/MEDQ  D:  02/21/2009  T:  02/22/2009  Job:  [id number]   cc:   Everardo Beals. Juanda Chance, MD, [facility]

## 2011-03-17 NOTE — Assessment & Plan Note (Signed)
OFFICE VISIT   [name], [name]  DOB:  [birth year]                                       11/08/2008  ZOXWR#:[id number]   The patient recently underwent an abdominal ultrasound which revealed  enlargement of his abdominal aortic aneurysm to 4.7 cm.  He therefore  underwent a noncontrast CT scan of the abdomen and pelvis at [facility].  His CT scan reveals his aneurysm to measure 4.1 cm in maximal  diameter.  He has however had significant enlargement of the right  common iliac artery now measuring 3 cm in transverse diameter.  Left  common iliac artery noted to measure 2 cm.  Also noted are bilateral  renal cysts and an exophytic lesion arising from the posterior mid pole  of the right kidney.   I have arranged for the patient to undergo an abdominal arteriogram and  also a stress test with Dr. Juanda Chance.  I would like to further evaluate  his aneurysm disease.  We will also arrange for an ultrasound of the  abdomen to evaluate the right kidney.   Balinda Quails, M.D.  Electronically Signed   PGH/MEDQ  D:  11/08/2008  T:  11/09/2008  Job:  [id number]   cc:   Everardo Beals. Juanda Chance, MD, [facility]

## 2011-03-17 NOTE — Discharge Summary (Signed)
NAME:  [name], [name] NO.:  [id number]   MEDICAL RECORD NO.:  [id number]          PATIENT TYPE:  INP   LOCATION:  2028                         FACILITY:  [facility]   PHYSICIAN:  Balinda Quails, M.D.    DATE OF BIRTH:  February 11, 1941   DATE OF ADMISSION:  01/21/2009  DATE OF DISCHARGE:  01/25/2009                               DISCHARGE SUMMARY   ADMISSION DIAGNOSES:  1. Infrarenal abdominal aortic aneurysm.  2. Right common iliac artery aneurysm.   FINAL/DISCHARGE DIAGNOSES:  1. Infrarenal abdominal aortic aneurysm and right common iliac artery      aneurysm, status post endovascular aneurysm repair.  2. Postoperative ileus, improved.  3. Postoperative leukocytosis, improving.  4. Hypertension.  5. Diabetes mellitus type 2.  6. Hyperlipidemia.  7. Coronary artery disease with history of bare-metal stent, right      coronary artery in 1988.  8. Barrett esophagus.  9. Hiatal hernia repair in 1984.  10.Ongoing tobacco use.  11.Chronic obstructive pulmonary disease.  12.Allergy to PENICILLIN.  13.Bibasilar atelectasis.   PROCEDURES:  On January 21, 2009, he underwent coil embolization, right  hypogastric artery and endovascular aneurysm repair with Cook venous  device (36 x 95 main body, 10 x 88 right iliac, 16 x 73 left iliac by  Dr. Denman George).   BRIEF HISTORY:  [name] is a 70 year old male followed at the [facility]  office for aneurysmal disease of his aorta and right common iliac  artery.  Recently they have enlarged, the right common iliac artery  aneurysm measured greater than 3 cm, and infrarenal aorta measured 4.8  cm.  Due to the size of his right common iliac artery aneurysm, it was  felt that this should be repaired.  The patient underwent further workup  and was found to be a candidate for placement of an aortic stent graft.  This would require coil embolization of the right hypogastric artery and  extension of the stent graft to the right external  iliac artery.  He  also was noted to have a small inferior pole, right accessory renal  artery, which would be sacrificed at that time.   HOSPITAL COURSE:  [name] was electively admitted to [facility] on January 21, 2009, and he underwent the previously mentioned  procedure.  Postoperatively, he was extubated, neurologically intact in  short-stay recovery unit, was sent to step-down unit 3200.  Overnight,  he had nausea and vomiting as well as right lower quadrant pain and  abdominal distention.  This was despite being given antiemetics.  Morning labs also showed an elevated white count of 17,000.  Otherwise,  he was felt stable.  He remained on a clear liquid diet and ultimately  we did get an abdominal x-ray, which showed increased diffuse gaseous  distention of the small bowel and colon seen with multiple air-fluid  levels, felt consistent with adynamic ileus.  He was treated with  antiemetics, hydration, activity, and Dulcolax suppositories and over  the course of the next few days, bowel function returned.  By January 25, 2009, he  had a bowel movement and was tolerating regular food.  He also  reported appetite improved and pain had as well.  His white count did  peak at 20,000 on March 24, but with serial CBCs, it was down to 14.2 by  discharge.  His amylase and lipase were normal.  Once his ileus  improved, he was felt appropriate for discharge.  He did have a low-  grade temperature, which was felt secondary to atelectasis, which was  confirmed on x-ray.  He did have some mild dyspnea on exertion but this  was at his baseline.  His incisions were healing well without signs of  infection.  He had palpable dorsalis pedis pulses.  On telemetry, he  remained in sinus rhythm and vitals stayed stable; most recently, blood  pressure 131/74, oxygen saturation 95% on room air, temperature 98.5.   DISPOSITION:  Mr. Ohanesian was discharged home on January 25, 2009, in stable  and an  improving condition.   LABORATORY VALUES:  As of March 26, latest labs showed a white count  that continues to trend down at 14.2, hemoglobin 9.2, hematocrit 27,  platelet count 150, lipase of 17, sodium 136, potassium 3.5, glucose of  125, BUN of 25, creatinine 1.41, amylase of 27 (baseline creatinine  1.05).   DISCHARGE MEDICATIONS:  1. Actos 30 mg p.o. daily.  2. Paxil 20 mg daily.  3. Prilosec 20 mg p.o. b.i.d.  4. Metoprolol 25 mg p.o. daily.  5. Vytorin 10/80 mg p.o. at bedtime.  6. Ambien 10 mg p.o. at bedtime p.r.n. for sleep.  7. Percocet 5/325 mg 1 tablet p.o. q.4 h. p.r.n. pain.   DISCHARGE INSTRUCTIONS:  He is continue a diabetic appropriate diet.  May shower and clean incisions gently with soap and water.  Avoid  driving or heavy lifting for the next week.  Increase activity slowly.  See Dr. Madilyn Fireman in approximately 4 weeks with CT of the abdomen, pelvis,  and ABIs.  He is to call sooner if he has fever greater than 101.2,  redness, drainage, or swelling from the incision site or persistent  nausea and vomiting.      Jerold Coombe, P.A.      Balinda Quails, M.D.  Electronically Signed    AWZ/MEDQ  D:  01/25/2009  T:  01/26/2009  Job:  [id number]   cc:   Balinda Quails, M.D.  Marcina Millard, MD  Everardo Beals. Juanda Chance, MD, [facility]

## 2011-03-17 NOTE — Assessment & Plan Note (Signed)
OFFICE VISIT   MCCABE, GLORIA  DOB:  [birth year]                                       10/20/2007  ZOXWR#:[id number]   Mr. Blades returns today for continued surveillance of a small abdominal  aortic aneurysm and a right common iliac artery aneurysm.   Since last seen, he has had no hospital admissions.  He fell and injured  his right knee and had to have an effusion drained by Dr. [name].  No  recent chest pain.  Denies shortness of breath.  No motor or sensory  deficits.  No abdominal pain or back pain.   A CT scan performed today at [facility] reveals his abdominal  aorta to be a maximum diameter of 3.9 cm, right common iliac artery is  aneursymal at 3.1 cm, and the left common iliac artery is 1.6 cm.   His chronic medical history includes coronary artery disease,  hyperlipidemia, hypertension, and Barrett's esophagus.  He has had a  hiatal hernia repair.   PHYSICAL EXAMINATION:  Mr. Oquinn appears well.  BP is 111/73, pulse 92  per minute.  He is alert and oriented in no acute distress.  There are  no carotid bruits.  Normal heart sounds without murmurs.  Chest is clear  with equal entry bilaterally.  Normal breath sounds.  His abdomen  reveals mild obesity.  Midline scar.  No hernia.  No masses or  organomegaly.  Two plus femoral pulses bilaterally.  No ankle edema.   PLAN:  Mr. Lecomte has stable abdominal aortic aneurysm, which is small at  3.9 cm.  His right common iliac aneurysm is 3.1 cm, this has not changed  depreciably in size.  I will plan to follow up with him again in 6  months with a repeat CT scan.   Balinda Quails, M.D.  Electronically Signed   PGH/MEDQ  D:  10/20/2007  T:  10/21/2007  Job:  [id number]   cc:   Georgina Quint. Plotnikov, MD  Everardo Beals. Juanda Chance, MD, [facility]

## 2011-03-17 NOTE — Assessment & Plan Note (Signed)
OFFICE VISIT   [name], [name]  DOB:  Jun 06, 1941                                       09/05/2009  WJXBJ#:[id number]   I saw the patient in the office today for continued followup of his  aneurysmal disease.  He had been followed by Dr. Madilyn Fireman and had a right  common iliac artery aneurysm measuring 3 cm and an infrarenal aorta  measuring 4.8 cm.  He underwent endovascular aneurysm repair on  01/21/2009 with a Cook Zenith device on coil embolization of his right  hypogastric artery.  He comes in for a 6 month followup visit.  Of note,  he has had no abdominal or back pain and no claudication, rest pain or  nonhealing ulcers.  He has done well from the standpoint of his  endovascular aneurysm repair.  He has a history of coronary artery  disease and has been asymptomatic.  He is followed by Dr. Huston Foley.  He  also has type 2 diabetes which is stable on his current medications,  hypertension stable on his current medications and hypercholesterolemia.  He denies any history of congestive heart failure or COPD.   FAMILY HISTORY:  He is unaware of any history of premature  cardiovascular disease.   SOCIAL HISTORY:  He is married.  He has three children.  He smokes a  pack per day of cigarettes and has been smoking for 52 years.  He does  not use alcohol on a regular basis.   REVIEW OF SYSTEMS:  He does have a history of Barrett's esophagus.  General, cardiac, pulmonary, GU, vascular, neurologic, musculoskeletal,  psychiatric, ENT, hematologic, skin review of systems is unremarkable  and documented on the medical history form in his chart.   PHYSICAL EXAMINATION:  General:  This is a pleasant 70 year old  gentleman who appears his stated age.  Vital signs:  His blood pressure  149/84, heart rate is 73, saturation 99%.  HEENT:  Pupils are equal,  round and reactive to light and accommodation.  Extraocular motions are  intact.  Conjunctivae are normal.  Neck:  Neck  is supple.  There is no  JVD.  I do not appreciate any cervical lymphadenopathy.  He has no  carotid bruits.  Lungs:  Clear bilaterally to auscultation without  rales, rhonchi or wheezing.  Cardiovascular:  Reveals a regular rate and  rhythm without murmur or gallop appreciated.  He has no significant  peripheral edema.  He has palpable femoral, radial and pedal pulses  bilaterally.  Musculoskeletal:  There are no major deformities or  cyanosis.  Neurologic:  There is no focal weakness or paresthesias.  Skin:  There are no ulcers or rashes.   I have independently interpreted his duplex of his stent graft.  This  shows no evidence of endo leak and a stable size of his aneurysm which  is 4.3 cm.  The right common iliac artery is stable and sized at 3.06  cm.  He also had an arterial Doppler study which I reviewed which shows  triphasic Doppler signals in both feet with ABIs of 100% bilaterally.   With respect to his aneurysm repair the aneurysm has not enlarged and  the stent appears to be in good position with no evidence of endo leak.  We will see him back in six months with a followup CT scan  of the  abdomen and pelvis.  If things remain stable we may be able to extend  his followup out to 1 year.  We will see him back at that time.  He  knows to call sooner if he has problems.   Di Kindle. Edilia Bo, M.D.  Electronically Signed   CSD/MEDQ  D:  09/05/2009  T:  09/06/2009  Job:  [id number]

## 2011-03-17 NOTE — Assessment & Plan Note (Signed)
OFFICE VISIT   [name], [name]  DOB:  12/30/1940                                       03/13/2010  ZOXWR#:[id number]   I saw the patient in the office today for continued followup after his  EVAR by Dr. Madilyn Fireman which was done on 01/21/2009.  He had a 4.8 cm  infrarenal abdominal aortic aneurysm and underwent placement of a Cook  Zenith device.  He comes in for a routine 6 month followup visit.  I had  previously seen him 6 months ago with a duplex scan which showed no  evidence of an endo leak and his aneurysm was stable in size at 4.3 cm.  He had a right common iliac artery aneurysm which was stable at 3.06 cm.  He had a CT scan today.  Of note, this was done without contrast.  He is  followed by Dr. Hyman Hopes with some mild chronic renal insufficiency and he  requested that he not receive contrast.  He comes in today to discuss  these results.   Since I saw him last he has had no history of abdominal or back pain.  There has been no significant change in his medical history.  He does  have a history of hypertension and hypercholesterolemia, both of which  are stable on his current medications.  He had a myocardial infarction  in March of 1998 but has had no recent cardiac problems.  He has mild  chronic renal insufficiency.   SOCIAL HISTORY:  He is married.  He smokes a pack per day of cigarettes.   REVIEW OF SYSTEMS:  CARDIOVASCULAR:  He has had no chest pain, chest  pressure, palpitations or arrhythmias.  He has had no productive cough,  bronchitis, asthma or wheezing.  GI:  He does have a history of reflux and a history of a hiatal hernia.   PHYSICAL EXAMINATION:  General:  This is a pleasant 70 year old  gentleman who appears his stated age.  Vital signs:  Blood pressure is  165/89, heart rate is 67, respiratory rate is 20.  HEENT:  Unremarkable.  Lungs:  Are clear bilaterally to auscultation without rales, rhonchi or  wheezing.  Cardiovascular:  I do  not detect any carotid bruits.  He has  a regular rate and rhythm without murmur appreciated.  He has palpable  femoral pulses and warm, well-perfused feet without ischemic ulcers.  Neurologic:  Nonfocal.   I have reviewed his CT scan which shows that the maximum diameter of his  aneurysm by my interpretation is 3.8 cm.  The right common iliac artery  measures 3.09 cm and has not changed in size.   I have reassured the patient that his graft is in good position and  there is no evidence of significant endo leak although the CAT scan was  done without contrast.  I think it is safe to alternate CAT scan with  duplex and we will see him back in 6 months with a duplex scan which I  have ordered.  I did also review his labs which show a BUN of 35 and a  creatinine of 1.8 so I would agree that it would probably be safest not  to use contrast for now if at all possible.   We will see him back in 6 months.  He knows to call sooner  if he has  problems.     Di Kindle. Edilia Bo, M.D.  Electronically Signed   CSD/MEDQ  D:  03/13/2010  T:  03/14/2010  Job:  [id number]

## 2011-03-17 NOTE — Op Note (Signed)
NAME:  [name], [name]                ACCOUNT NO.:  [id number]   MEDICAL RECORD NO.:  [id number]          PATIENT TYPE:  AMB   LOCATION:  SDS                          FACILITY:  [facility]   PHYSICIAN:  Balinda Quails, M.D.    DATE OF BIRTH:  [birth year]   DATE OF PROCEDURE:  11/20/2008  DATE OF DISCHARGE:                               OPERATIVE REPORT   DIAGNOSIS:  Infrarenal abdominal aortic aneurysm and right common iliac  artery aneurysm.   PROCEDURE:  Abdominal aortogram with pelvic runoff.  Access right common  femoral artery 5-French sheath.   CONTRAST:  110 mL Visipaque.   COMPLICATIONS:  None apparent.   CLINICAL NOTE:  [name] is a 70 year old gentleman followed in the  office with known abdominal aortic and iliac aneurysm disease.  Recent  enlargement of his right common iliac artery now measuring greater than  3 cm.  Infrarenal aorta measures 4.1 cm.  He has had a noncontrast CT  scan.  Brought to the cath lab at this time for further workup with  arteriography.   PROCEDURE NOTE:  The patient brought to cath lab in stable condition.  Placed in supine position.  Both groins were prepped and draped in  sterile fashion.  Skin and subcutaneous tissue in right groin was  instilled with 1% Xylocaine.  The patient administered 50 mcg fentanyl  intravenously.   The right common femoral artery accessed with an 18-gauge needle.  A  0.035 Versacore guidewire advanced through the needle into the mid  abdominal aorta under fluoroscopy.  A 5-French sheath advanced over the  guidewire.   A graduated pigtail catheter advanced over the guidewire to the mid  abdominal aorta.   Standard mid abdominal AP and lateral aortogram obtained.  The renal  arteries were single bilaterally and widely patent.  The lateral  aortogram verified a patent celiac and superior mesenteric artery with a  moderate superior mesenteric artery stenosis.   The pigtail catheter brought down the aortic  bifurcation.  Bilateral  oblique pelvic arteriograms obtained.  This revealed a large right  common iliac artery aneurysm extending down to the right common iliac  bifurcation.  There was a stenosis at the origin of left common iliac  artery and an ulcerated plaque noted in the distal left common iliac  artery with ectasia.  The external iliac and hypogastric arteries were  patent bilaterally.   This completed the arteriogram procedure.  The guidewire reinserted  pigtail catheter removed.  Right femoral sheath removed.  No apparent  complications.   Plan at this time will be to review films further and decide upon  definitive management, open versus endovascular repair.      Balinda Quails, M.D.  Electronically Signed     PGH/MEDQ  D:  11/20/2008  T:  11/20/2008  Job:  [id number]

## 2011-03-17 NOTE — Assessment & Plan Note (Signed)
OFFICE VISIT   Bullhead, ANTOLIN  DOB:  1940-12-17                                       10/25/2008  WJXBJ#:[id number]   The patient returned to the office today to discuss further workup of  his abdominal aortic aneurysm.  He has had significant recent growth,  now measuring 4.7 cm in maximal diameter with previous study carried out  6 months ago at 4.0 cm.  This represents significant growth.  I,  therefore, think we should go ahead with a CT scan.  He does have some  renal insufficiency, therefore we will obtain a CT scan of the abdomen  pelvis without IV contrast to further evaluate the size of his aneurysm  and potential architecture.   Balinda Quails, M.D.  Electronically Signed   PGH/MEDQ  D:  10/25/2008  T:  10/29/2008  Job:  [id number]

## 2011-03-17 NOTE — Assessment & Plan Note (Signed)
OFFICE VISIT   [name], [name]  DOB:  [birth year]                                       04/12/2008  WGNFA#:[id number]   The patient returned today for continued abdominal aortic aneurysm  surveillance.  He underwent a CT scan today, this reveals his aneurysm  to be 4 cm in maximal diameter.  This compares favorably with an  ultrasound which was carried out in April revealing a 3.7-cm abdominal  aortic aneurysm.   The patient has remained asymptomatic.  He denies abdominal pain or back  pain.   He appears well.  No acute distress.  Alert and oriented.  BP 144/90,  pulse 92 per minute.  Abdomen:  Soft, nontender.  Palpable AAA.  No  masses or organomegaly.  2+ femoral pulses bilaterally.  No ankle edema.   The patient continues to do well without significant abdominal aortic  aneurysm enlargement.  We will plan followup again in 6 months with  ultrasound of the abdomen.   Balinda Quails, M.D.  Electronically Signed   PGH/MEDQ  D:  04/12/2008  T:  04/13/2008  Job:  [id number]   cc:   Georgina Quint. Plotnikov, MD  Everardo Beals. Juanda Chance, MD, [facility]

## 2011-03-17 NOTE — Procedures (Signed)
ENDOVASCULAR STENT GRAFT EXAM   INDICATION:  Followup abdominal aortic aneurysm repair.   HISTORY:  AAA endograft 01/21/2009 by Dr. Madilyn Fireman.                           DUPLEX EVALUATION   AAA Sac Size:                 4.35 CM AP            4.22 CM TRV  Previous Sac Size:            02/21/2009 (CT) 4.3 CM AP  CM TRV  Evidence of an endoleak?      No                    No   Velocity Criteria:  Proximal Aorta                74 cm/sec  Proximal Stent Graft          86 cm/sec  Main Body Stent Graft-Mid     61 cm/sec  Right Limb-Proximal           48 cm/sec  Right Limb-Distal             78 cm/sec  Left Limb-Proximal            68 cm/sec  Left Limb-Distal              83 cm/sec  Patent Renal Arteries?        Yes                   Yes   IMPRESSION:  1. Patent abdominal aortic aneurysm endograft with no evidence of endo      leak.  2. Stable residual abdominal aortic aneurysm sac size from CT.  3. Right common iliac artery residual sac size measured 3.06 cm,      however, not well visualized so may be underestimated.  4. Note, technically limited study due to bowel gas and body habitus.   ___________________________________________  Di Kindle. Edilia Bo, M.D.   AS/MEDQ  D:  09/05/2009  T:  09/05/2009  Job:  [id number]

## 2011-03-17 NOTE — Assessment & Plan Note (Signed)
[facility]                            CARDIOLOGY OFFICE NOTE   WINDELL, MUSSON                       MRN:          [id number]  DATE:09/05/2008                            DOB:          08/01/1941    PRIMARY CARE PHYSICIAN:  Marcina Millard, MD   CLINICAL HISTORY:  Mr. Humphres is 70 years old and returned for management  of his coronary heart disease.  He is retired from Retail banker at [employer] .  He had a bare-metal stent placed to the right coronary  artery in 1998.  The last catheterization was in 2001, at which time, he  had nonobstructive disease.   He says he has been doing well with his heart, had no recent chest pain,  shortness breath, or palpitations.   PAST MEDICAL HISTORY:  Significant for hypertension, diabetes, and  hyperlipidemia.  He recently had laboratory studies with Dr. [name]  evaluating this.  He also has an abdominal aneurysm, which has been  followed by Dr. Madilyn Fireman and which has been last sized at 4.0.   CURRENT MEDICATIONS:  Toprol-XL 50 mg daily, Paxil, Actos, Prilosec,  Vytorin 10/80 daily, aspirin, and Ambien.   On examination, the blood pressure was 140/82 and the pulse 84 and  regular.  There was no venous tension.  The carotid pulses were full  without bruits.  The chest was clear.  The cardiac rhythm was regular.  He had no murmurs or gallops.  The abdomen was soft with normal bowel  sounds.  Peripheral pulses were full.  There is no peripheral edema.   Electrocardiogram was normal.   IMPRESSION:  1. Coronary artery disease status post bare-metal stent to the right      coronary in 1998, now stable.  2. Good left ventricular function.  3. Hypertension, borderline control.  4. Hyperlipidemia.  5. Abdominal aortic aneurysm 4.0 cm in size.  6. Barrett esophagus.  7. Continued cigarette use.   RECOMMENDATIONS:  I think, Mr. Kaczmarczyk is doing well.  I encouraged him to  work on discontinuing  cigarettes altogether.  He tried Chantis but this  made him irritable and he had to  stop it.  His blood pressure is borderline, encouraging to lose weight,  exercise more, and watch the salt in his diet.  We plan to see him back  in followup in a year.     Bruce Elvera Lennox Juanda Chance, MD, [facility]  Electronically Signed    BRB/MedQ  DD: 09/05/2008  DT: 09/05/2008  Job #: [id number]

## 2011-03-17 NOTE — Procedures (Signed)
DUPLEX ULTRASOUND OF ABDOMINAL AORTA   INDICATION:  Follow-up abdominal aortic aneurysm.   HISTORY:  Diabetes:  No.  Cardiac:  No.  Hypertension:  Yes.  Smoking:  Yes.  Connective Tissue Disorder:  Family History:  No.  Previous Surgery:  Hiatal hernia repair.   DUPLEX EXAM:         AP (cm)                   TRANSVERSE (cm)  Proximal             2.7 cm                    2.9 cm  Mid                  2.8 cm                    2.9 cm  Distal               4.7 cm                    4.3 cm  Right Iliac          3.3 cm                    3.2 cm  Left Iliac           1.6 cm                    1.6 cm   PREVIOUS:  Date: 04/12/2008 (CT)  AP:  4.0  TRANSVERSE:   IMPRESSION:  1. Aneurysmal dilatation of the distal abdominal aorta and right      proximal common iliac arteries with a      significant increase in the maximum diameter measurement of the      abdominal aorta noted when compared to the previous exam done on      04/12/2008.  2. Decreased visualization of the abdominal aorta and common iliac      arteries due to overlying bowel gas patterns and patient body      habitus.   ___________________________________________  P. Liliane Bade, M.D.   CH/MEDQ  D:  10/18/2008  T:  10/18/2008  Job:  [id number]

## 2011-03-17 NOTE — Op Note (Signed)
NAME:  [name], [name] NO.:  [id number]   MEDICAL RECORD NO.:  [id number]          PATIENT TYPE:  INP   LOCATION:  3315                         FACILITY:  [facility]   PHYSICIAN:  Balinda Quails, M.D.    DATE OF BIRTH:  1940-12-08   DATE OF PROCEDURE:  01/21/2009  DATE OF DISCHARGE:                               OPERATIVE REPORT   SURGEON:  Balinda Quails, MD   ASSISTANT:  Wilmon Arms, PA   ANESTHETIC:  General endotracheal.   ANESTHESIOLOGIST:  Quita Skye. Krista Blue, MD   PREOPERATIVE DIAGNOSES:  1. Infrarenal abdominal aortic aneurysm.  2. Right common iliac artery aneurysm.   POSTOPERATIVE DIAGNOSES:  1. Infrarenal abdominal aortic aneurysm.  2. Right common iliac artery aneurysm.   PROCEDURE:  1. Coil embolization, right hypogastric artery.  2. Endovascular aneurysm repair with Adriana Simas Zenith device (36 x 95 main      body, 10 x 88 right iliac, 16 x 73 left iliac).   CLINICAL NOTE:  [name] is a 70 year old male followed in the  office with known aneurysmal disease of the aorta and right common iliac  artery.  These have now enlarged.  Right common iliac artery aneurysm  measuring greater than 3 cm.  The infrarenal aorta measuring 4.8 cm.  Due to the size of right common iliac artery aneurysm, the patient  underwent further workup and investigation and was felt to be candidate  for placement of aortic stent graft.  This will require coil  embolization of the right hypogastric artery and extension of stent  graft in right external iliac artery.  He also has a small inferior pole  right accessory renal artery which will be sacrificed at this time.   OPERATIVE PROCEDURE:  The patient was brought to the operating room in  stable condition.  Placed in supine position.  General endotracheal  anesthesia was induced.  Foley catheter and arterial line in place.  In  the supine position, the abdomen was prepped and draped in sterile  fashion.   Bilateral  oblique groin skin incisions were made.  Dissection was  carried through subcutaneous tissue using electrocautery.  Deep  dissection carried down to expose the common femoral artery at the  inguinal ligament, encircled proximally with a vessel loop.  Distal  dissection carried down to the origin of the profunda and superficial  femoral arteries which were freed and encircled with vessel loops.   The patient administered 7000 units of heparin intravenously.   Left common femoral artery, contralateral vessel was accessed with an 18-  gauge needle.  A 0.035 Bentson guidewire was advanced into the abdominal  aorta.  A long 8-French sheath was advanced over guidewire, the dilator  removed.  A crossover catheter was then advanced over the guidewire.  This was engaged in the right common iliac artery origin.  Using an  angled Glidewire, the wire was extended down into the right external  iliac artery.  The crossover catheter was then advanced down into the  right external iliac artery and slowly withdrawn back to access the  right  hypogastric artery.  This was then accessed with the Glidewire.  The crossover catheter was removed and an end-hole catheter was then  advanced over guidewire to the origin of the right hypogastric artery.  Coil embolization was then carried out of the main body of the right  hypogastric artery using 2 Nestor coils, 10 x 14 and 10 x 5.  This  resulted in excellent coiling with no retrograde or antegrade flow.   The guidewire was then drawn back into the abdominal aorta, and a  graduated Omni flush catheter was advanced over guidewire to the  suprarenal aorta.   The right common femoral artery was then accessed with an 18-gauge  needle.  A 0.035 Bentson guidewire was advanced into the abdominal  aorta.  An 8-French sheath advanced over guidewire, dilator removed,  sheath flushed with heparin saline solution.  An exchange was then made  through a catheter for an  Amplatz Super Stiff guidewire in the right  groin.   The main body of the device was then readied and loaded onto the  ipsilateral right guidewire and advanced up to the juxtarenal aorta.  Abdominal aortogram obtained, this delineated the origin of the renal  arteries and the inferior pole right renal artery.  The inferior pole  right renal artery was sacrificed.   The main body device was then deployed in the immediate infrarenal  position with small contrast injections to verify patency of the main  renal arteries.  Once the device was deployed down to the ipsilateral  right iliac limb, the top cap was released, releasing the suprarenal  stent.   The Omni flush catheter was then drawn back into the abdominal aorta,  and the contralateral limb was accessed with a Bentson guidewire.  The  Omni flush catheter was advanced into the graft in an intragraft  position verified by twisting the catheter.  This was then exchanged for  an Amplatz Super Stiff guidewire, the Omni flush catheter left on the  guidewire and retrograde injection made through the left femoral sheath  verifying position of the left hypogastric artery.  The contralateral  iliac limb (left) was then stented with a 16 x 73 stent brought down to  the origin of the left hypogastric artery and external iliac artery  origins.   On the ipsilateral side, the ipsilateral limb was then completely  deployed from the main body and a 10 x 88 ipsilateral right iliac limb  was deployed down to beyond the origin of the right hypogastric artery.   A Coda balloon was then used to balloon the junctions of the grafts.  Following this, a completion arteriogram was obtained.  This revealed no  evidence of significant endoleak.  Excellent flow through each of the  iliac limbs, left hypogastric and external iliac arteries patent.  Right  external iliac artery patent.   Guidewires and catheters were then removed.  The arteriotomies   bilaterally were closed in femoral arteries with running 5-0 Prolene  suture.  The groin was closed with running 2-0 Vicryl suture in 2  subcutaneous layers and 4-0 Monocryl applied to skin.  Dermabond  applied.   No apparent complications.  The patient tolerated the procedure well.  Transferred to recovery room in stable condition.      Balinda Quails, M.D.  Electronically Signed     PGH/MEDQ  D:  01/21/2009  T:  01/22/2009  Job:  [id number]

## 2011-03-17 NOTE — Assessment & Plan Note (Signed)
OFFICE VISIT   [name], [name]  DOB:  [birth year]                                       04/21/2007  FAOZH#:[id number]   SUBJECTIVE:  [name] returns today with the results of the CT of the  abdomen and pelvis.  This was carried out without contrast due to renal  insufficiency.   This reveals a 3.7-cm infrarenal abdominal aortic aneurysm and a 3-cm  proximal right common iliac artery aneurysm.   These findings do not necessitate any further workup.   PLAN:  Plan at this time will be to followup in 6 months with a repeat  scan without contrast.   P. Liliane Bade, M.D.  Electronically Signed   PGH/MEDQ  D:  04/21/2007  T:  04/22/2007  Job:  [id number]   cc:   Georgina Quint. Plotnikov, MD  Everardo Beals. Juanda Chance, MD, [facility]

## 2011-03-17 NOTE — Assessment & Plan Note (Signed)
[facility]                            CARDIOLOGY OFFICE NOTE   HELTON, OLESON                       MRN:          [id number]  DATE:09/06/2007                            DOB:          [birth year]    PRIMARY CARE PHYSICIAN:  Jacinta Shoe, M.D.   CLINICAL HISTORY:  Mr. 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  He had unstable angina in 1998 and had  a stent placed in the right coronary artery.  In 2001 he had  nonobstructive disease at catheterization.  He says he has been doing  quite well with no recent chest pain, shortness of breath or  palpitations.   His past medical history is significant for abdominal aortic aneurysm  which is 3.4 x 3.7 and is followed by Dr. Madilyn Fireman.  He also has a history  of hypertension, diabetes, and hyperlipidemia.   His current medications include Toprol-XL, Paxil, Actos, Vytorin,  aspirin, and Prilosec.   On examination today, blood pressure is 158/89, pulse 71 and regular.  There was no venous distention.  Carotid pulses were full without  bruits.  The chest was clear.  The cardiac rhythm was regular.  I could  hear no murmurs or gallops.  The abdomen was soft without organomegaly.  Peripheral pulses were full and there was no peripheral edema.   An electrocardiogram was normal.   IMPRESSION:  1. Coronary artery disease status post prior stenting of the right      coronary artery in 1998.  2. Good left ventricular function.  3. Abdominal aortic aneurysm 3.7 cm in size.  4. Hyperlipidemia.  5. Hypertension, not under good control today but good by prior      readings.  6. Barrett's esophagus.   RECOMMENDATIONS:  I think Mr. Lamm is stable from the standpoint of his  heart.  His blood pressure is a little bit up today but he said it was  good with Dr. [name] just recently so we will not make any change in  his therapy.  He just recently had lab studies with Dr.  [name] so we  will not repeat those.  I will plan to see him back in followup in a  year.     Bruce Elvera Lennox Juanda Chance, MD, [facility]  Electronically Signed    BRB/MedQ  DD: 09/06/2007  DT: 09/07/2007  Job #: [id number]

## 2011-03-17 NOTE — Assessment & Plan Note (Signed)
[name]                         GASTROENTEROLOGY OFFICE NOTE   [name], [name]                       MRN:          [id number]  DATE:09/14/2007                            DOB:          [birth year]    CHIEF COMPLAINT:  Followup after biopsy of Barrett's esophagus.   [name] is a previous patient of Dr. Corinda Gubler.  Recent endoscopy showed  some gastric retention and Barrett's esophagus with low grade dysplasia.  He does have postprandial fullness.  Because of the food retention, I  brought him in to discuss this situation and consider other testing.  His medications are listed and reviewed in the chart.  His heartburn is  controlled on Prilosec 20 mg b.i.d.  He has type 2 diabetes.  See the  chart for full details.  He is allergic to PENICILLIN.   PHYSICAL EXAM:  Height 5 feet 9 inches, weight 179 pounds, pulse 68,  blood pressure 124/68.   PAST MEDICAL HISTORY:  Again, as listed in our problem list on the  chart, reviewed, and unchanged.   ASSESSMENT:  1. Barrett's esophagus with low grade dysplasia.  2. Gastric retention on EGD, question gastroparesis.  3. Type 2 diabetes mellitus.   PLAN:  Check gastric emptying study.  He may need prokinetic therapy.  I  will call him with the results.     Iva Boop, MD,FACG  Electronically Signed    CEG/MedQ  DD: 09/15/2007  DT: 09/16/2007  Job #: [id number]

## 2011-03-17 NOTE — Procedures (Signed)
VASCULAR LAB EXAM   INDICATION:  Followup AAA endograft placed 01/21/2009 by Dr. Madilyn Fireman.   HISTORY:  Diabetes:  No.  Cardiac:  No.  Hypertension:  Yes.  Smoking:  Yes.   EXAM:  AAA sac size 3.61 cm AP, 3.47 cm transverse.   Previous sac size (CT ) 03/13/2010 3.8cm AP.   IMPRESSION:  1. The aorta and endograft appear patent.  2. No significant change in size of the aneurysmal sac surrounding the      endograft.  3. No evidence of endo leak was detected.  4. Portions of the abdominal aorta was difficult to image due to      overlying bowel gas.   ___________________________________________  Larina Earthly, M.D.   EM/MEDQ  D:  09/16/2010  T:  09/16/2010  Job:  [id number]

## 2011-03-17 NOTE — Assessment & Plan Note (Signed)
OFFICE VISIT   [name], [name]  DOB:  [birth year]                                       09/16/2010  ZOXWR#:[id number]   The patient presents today for continued followup of his infrarenal  abdominal aortic aneurysm of his stent graft repair.  This was done by  Dr. Liliane Bade in March 2010.  He does have baseline renal insufficiency  and therefore has not been followed with a contrast CT.  He had a  noncontrast CT scan 6 months ago with a maximal diameter of 3.8 cm and  is here today for ultrasound followup.  He reports that his health is  otherwise unchanged with no major medical difficulties.  Denies any  cardiac disease.  He is hypertensive, he does smoke, and he does have  baseline renal insufficiency.   PHYSICAL EXAMINATION:  A well-developed, well-nourished white male  appearing stated age in no acute distress.  Blood pressure is 186/101,  pulse 86, respirations 18.  His oxygen saturation is 97%.  He is in no  acute distress.  HEENT:  Normal.  His abdomen shows an upper midline  incision which he reports is from a prior hiatal hernia repair.  He has  no tenderness, no masses.  Musculoskeletal shows no major deformity or  cyanosis.  Neurologic:  No focal weakness or paresthesias.  Skin:  Without ulcers or rashes.  Heart:  Regular rate and rhythm.  Chest:  Clear bilaterally.   He underwent ultrasound today, which I have ordered and independently  interpreted with the patient.  This shows the continued shrinkage of his  aneurysm sac down to 3.6 cm.  I discussed this at length with the  patient and recommend that we see him in 1 year with a noncontrast CT  for followup.  He will notify us should he develop any difficulties in  the interim.     Larina Earthly, M.D.  Electronically Signed   TFE/MEDQ  D:  09/16/2010  T:  09/17/2010  Job:  [id number]   cc:   Georgina Quint. Plotnikov, MD  Garnetta Buddy, M.D.

## 2011-03-20 NOTE — Assessment & Plan Note (Signed)
[facility]                            CARDIOLOGY OFFICE NOTE   GREIG, ALTERGOTT                       MRN:          [id number]  DATE:10/15/2006                            DOB:          10-25-1941    PRIMARY CARE PHYSICIAN:  Alek Plotnikov.   CLINICAL HISTORY:  Mr. 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  He had unstable angina in 1998 and  was treated with a stent to the right coronary artery.  His last  catheterization was in 2001 at which time he had nonobstructive coronary  disease.  He has had no recent chest pain, shortness of breath, or  palpitations.   PAST MEDICAL HISTORY:  Significant for abdominal aortic aneurysm which  was last sized in April, at which time it was 3.4 x 3.7 cm.  He also has  a history of diabetes.   CURRENT MEDICATIONS:  Include Toprol, Paxil, Actos, Enalapril, Vytorin,  aspirin, and Prilosec.   EXAMINATION:  Blood pressure 137/76 and pulse 65 and regular.  There was no venous distention.  The carotid pulses were full without  bruits.  CHEST:  Clear.  HEART:  Rhythm was regular.  No murmurs or gallops.  ABDOMEN:  Soft and normal bowel sounds.  I could not feel the aneurysm.  There was no hepatosplenomegaly.  Peripheral pulses were full.  There was no peripheral edema.   An ECG was showed minor nonspecific ST-T change.   IMPRESSION:  1. Coronary artery disease status post prior stenting of the right      coronary in 1998.  2. Good left ventricular function.  3. Abdominal aortic aneurysm with size above.  4. Hyperlipidemia.  5. Hypertension.  6. Barrett's esophagus.   RECOMMENDATIONS:  I think Mr. Murrillo is doing well.  His recent lipid  profile that Dr. [name] got showed his HDL is 34 and his LDL is 98,  so he is not quite a target with either of these.  He is on good  medicines, so I encouraged him to follow a low-glycemic diet and try and  lose some weight.   We will see him back in a year.     Bruce Elvera Lennox Juanda Chance, MD, [facility]  Electronically Signed   BRB/MedQ  DD: 10/15/2006  DT: 10/15/2006  Job #: [id number]

## 2011-03-20 NOTE — Cardiovascular Report (Signed)
[place]  Patient:    [name], [name]                       MRN: [id number] Proc. Date: 04/23/00 Adm. Date:  [id number] Disc. Date: [id number] Attending:  Lenoria Farrier                        Cardiac Catheterization  PROCEDURE:  Cardiac catheterization.  DESCRIPTION OF PROCEDURE:  Left heart catheterization was performed percutaneous via the right femoral artery using an arterial sheath and 6 French preformed coronary catheters.  A front wall arterial puncture was performed and Omnipaque contrast was used.  Distal aortogram was performed to rule out abdominal aortic aneurysm.  The right femoral artery was closed with Perclose at the end of the procedure.  The patient tolerated the procedure well and left the laboratory in satisfactory condition.  RESULTS:  The left main coronary artery:  The left main coronary artery was free of significant disease.  Left anterior descending:  The left anterior descending artery gave rise to a septal perforator and diagonal branch.  There was 40% narrowing in the proximal LAD and 40% narrowing in the mid LAD with some irregularities.  Circumflex artery:  The circumflex artery gave rise to a marginal branch and AV branch.  There was 40% narrowing in the sub-branch of the marginal branch.  Right coronary artery:  The right coronary was a moderate sized vessel that gave rise to a conus branch, a right ventricular branch, a posterior descending branch and four posterolateral branches.  There was 0% stenosis in the proximal right coronary artery at the site of the stent.  There was 30% narrowing in the mid right coronary artery.  LEFT VENTRICULOGRAPHY:  The left ventriculogram performed in the RAO projection showed good wall motion with no areas of hypokinesis.  The estimated ejection fraction was 60%.  A distal aortogram was performed which showed patent renal arteries.  There was a small distal abdominal  aortic aneurysm.  There was 40% narrowing in the left iliac.  The aortic pressure was 134/48 with a mean of 91.  The left ventricular pressure was 134/20.  CONCLUSIONS: 1. Coronary artery disease, status post prior stenting of the proximal right    coronary artery in 1998 with 0% stenosis at the stent site, 30% narrowing    in the mid right coronary artery, 40% narrowing in the proximal and mid LAD    and 40% narrowing in the distal sub-branch of the circumflex artery with    normal LV function. 2. Small distal abdominal aortic aneurysm.  RECOMMENDATIONS:  The patient has no major obstructive coronary disease.  I suspect his recent symptoms are not ischemic.  We will plan reassurance and continued risk factor modification. DD:  04/23/00 TD:  04/26/00 Job: [id number]

## 2011-03-25 ENCOUNTER — Telehealth: Payer: Self-pay | Admitting: *Deleted

## 2011-03-25 NOTE — Telephone Encounter (Signed)
rec rf req for Temazepam 15mg  1 -2 qhs prn insomnia. # 60.    Mes is not active in centriciy.......(Last filled 03-04-11.)   Ok to Rf?

## 2011-03-25 NOTE — Telephone Encounter (Signed)
OK to fill this prescription with additional refills x5 Thank you!  

## 2011-03-26 MED ORDER — TEMAZEPAM 15 MG PO CAPS: 15.0000 mg | ORAL_CAPSULE | Freq: Every evening | ORAL | Status: DC | PRN

## 2011-04-20 ENCOUNTER — Other Ambulatory Visit: Payer: Self-pay | Admitting: Internal Medicine

## 2011-05-21 ENCOUNTER — Telehealth: Payer: Self-pay | Admitting: *Deleted

## 2011-05-21 NOTE — Telephone Encounter (Signed)
Pt is in "doughnut hole" with Rx Insurance and Lyrica will cost $158; cannot afford. Is there alternative? Have samples ready for Pt.

## 2011-05-22 NOTE — Telephone Encounter (Signed)
Pt informed samples ready to p/u until decision made on alternative med.

## 2011-05-23 MED ORDER — GABAPENTIN 300 MG PO CAPS: 300.0000 mg | ORAL_CAPSULE | Freq: Three times a day (TID) | ORAL | Status: DC

## 2011-05-23 NOTE — Telephone Encounter (Signed)
D/c Lyrica Start Gabapentin - generic Thx

## 2011-05-25 MED ORDER — GABAPENTIN 300 MG PO CAPS: 300.0000 mg | ORAL_CAPSULE | Freq: Three times a day (TID) | ORAL | Status: DC

## 2011-05-25 NOTE — Telephone Encounter (Signed)
Rx Done to [facility] per Pt request.

## 2011-05-25 NOTE — Telephone Encounter (Signed)
LMOM for Pt as to MDs new instructions and to call back & verify if he wants this to pharmacy & that [facility] is the correct pharmacy.

## 2011-06-02 ENCOUNTER — Encounter: Payer: Self-pay | Admitting: Internal Medicine

## 2011-06-03 ENCOUNTER — Telehealth: Payer: Self-pay | Admitting: Internal Medicine

## 2011-06-03 NOTE — Telephone Encounter (Signed)
Left message for patient to call back  

## 2011-06-04 NOTE — Telephone Encounter (Signed)
Per path report from 03/2010 he is due for 1 year Endo recall.  He is scheduled for EGD on 07/07/11 8:30, and pre-visit on 06/30/11 9:00

## 2011-06-11 ENCOUNTER — Other Ambulatory Visit (INDEPENDENT_AMBULATORY_CARE_PROVIDER_SITE_OTHER): Payer: Medicare Other

## 2011-06-11 DIAGNOSIS — I10 Essential (primary) hypertension: Secondary | ICD-10-CM

## 2011-06-11 DIAGNOSIS — E119 Type 2 diabetes mellitus without complications: Secondary | ICD-10-CM

## 2011-06-11 LAB — BASIC METABOLIC PANEL
BUN: 32 mg/dL — ABNORMAL HIGH (ref 6–23)
CO2: 22 mEq/L (ref 19–32)
Chloride: 109 mEq/L (ref 96–112)
Creatinine, Ser: 2.7 mg/dL — ABNORMAL HIGH (ref 0.4–1.5)
Glucose, Bld: 96 mg/dL (ref 70–99)

## 2011-06-18 ENCOUNTER — Other Ambulatory Visit: Payer: Self-pay | Admitting: Vascular Surgery

## 2011-06-18 DIAGNOSIS — I714 Abdominal aortic aneurysm, without rupture: Secondary | ICD-10-CM

## 2011-06-19 ENCOUNTER — Encounter: Payer: Self-pay | Admitting: Internal Medicine

## 2011-06-19 ENCOUNTER — Ambulatory Visit (AMBULATORY_SURGERY_CENTER): Payer: Medicare Other | Admitting: *Deleted

## 2011-06-19 ENCOUNTER — Ambulatory Visit (INDEPENDENT_AMBULATORY_CARE_PROVIDER_SITE_OTHER): Payer: Medicare Other | Admitting: Internal Medicine

## 2011-06-19 VITALS — Ht 69.0 in | Wt 196.0 lb

## 2011-06-19 DIAGNOSIS — I2510 Atherosclerotic heart disease of native coronary artery without angina pectoris: Secondary | ICD-10-CM

## 2011-06-19 DIAGNOSIS — N259 Disorder resulting from impaired renal tubular function, unspecified: Secondary | ICD-10-CM

## 2011-06-19 DIAGNOSIS — I10 Essential (primary) hypertension: Secondary | ICD-10-CM

## 2011-06-19 DIAGNOSIS — E119 Type 2 diabetes mellitus without complications: Secondary | ICD-10-CM

## 2011-06-19 DIAGNOSIS — E785 Hyperlipidemia, unspecified: Secondary | ICD-10-CM

## 2011-06-19 DIAGNOSIS — K2270 Barrett's esophagus without dysplasia: Secondary | ICD-10-CM

## 2011-06-19 DIAGNOSIS — I739 Peripheral vascular disease, unspecified: Secondary | ICD-10-CM

## 2011-06-19 DIAGNOSIS — D485 Neoplasm of uncertain behavior of skin: Secondary | ICD-10-CM

## 2011-06-19 MED ORDER — OMEPRAZOLE 40 MG PO CPDR: 40.0000 mg | DELAYED_RELEASE_CAPSULE | Freq: Two times a day (BID) | ORAL | Status: DC

## 2011-06-19 NOTE — Assessment & Plan Note (Signed)
Skin bx 

## 2011-06-19 NOTE — Assessment & Plan Note (Signed)
On Rx 

## 2011-06-19 NOTE — Progress Notes (Signed)
PATIENT HAD EGD 70YR AGO WITH FENTANYL 75 MCG & VERSED 6 MG., HE STATES HE SLEPT DURING THE EXAM AND HAD NO PROBLEMS WITH SEDATION. DID NOT SCHEDULE PT WITH PROPOFOL AT THIS TIME.

## 2011-06-19 NOTE — Assessment & Plan Note (Signed)
Monitoring labs. On Rx

## 2011-06-19 NOTE — Progress Notes (Signed)
  Subjective:    Patient ID: [name], male    DOB: [birth year], 70 y.o.   MRN: [id number]  HPI The patient presents for a follow-up of  chronic hypertension, GERD, chronic dyslipidemia, type 2 diabetes controlled with medicines     Review of Systems  Constitutional: Negative for appetite change, fatigue and unexpected weight change.  HENT: Negative for nosebleeds, congestion, sore throat, sneezing, trouble swallowing and neck pain.   Eyes: Negative for itching and visual disturbance.  Respiratory: Negative for cough.   Cardiovascular: Negative for chest pain, palpitations and leg swelling.  Gastrointestinal: Negative for nausea, diarrhea, blood in stool and abdominal distention.  Genitourinary: Negative for frequency and hematuria.  Musculoskeletal: Negative for back pain, joint swelling and gait problem.  Skin: Negative for rash.  Neurological: Negative for dizziness, tremors, speech difficulty and weakness.  Psychiatric/Behavioral: Negative for sleep disturbance, dysphoric mood and agitation. The patient is nervous/anxious.        Objective:   Physical Exam  Constitutional: He is oriented to person, place, and time. He appears well-developed.  HENT:  Mouth/Throat: Oropharynx is clear and moist.  Eyes: Conjunctivae are normal. Pupils are equal, round, and reactive to light.  Neck: Normal range of motion. No JVD present. No thyromegaly present.  Cardiovascular: Normal rate, regular rhythm, normal heart sounds and intact distal pulses.  Exam reveals no gallop and no friction rub.   No murmur heard. Pulmonary/Chest: Effort normal and breath sounds normal. No respiratory distress. He has no wheezes. He has no rales. He exhibits no tenderness.  Abdominal: Soft. Bowel sounds are normal. He exhibits no distension and no mass. There is no tenderness. There is no rebound and no guarding.  Musculoskeletal: Normal range of motion. He exhibits no edema and no tenderness.    Lymphadenopathy:    He has no cervical adenopathy.  Neurological: He is alert and oriented to person, place, and time. He has normal reflexes. No cranial nerve deficit. He exhibits normal muscle tone. Coordination normal.  Skin: Skin is warm and dry. No rash noted.  Psychiatric: He has a normal mood and affect. His behavior is normal. Judgment and thought content normal.   L forearm lesions susp for cancer  Lab Results  Component Value Date   WBC 10.2 12/05/2010   HGB 10.5* 12/05/2010   HCT 32.3* 12/05/2010   PLT 186 12/05/2010   CHOL 170 02/10/2011   TRIG 170.0* 02/10/2011   HDL 45.90 02/10/2011   LDLDIRECT 92.8 04/17/2010   ALT 14 12/05/2010   AST 18 12/05/2010   NA 139 06/11/2011   K 4.9 06/11/2011   CL 109 06/11/2011   CREATININE 2.7* 06/11/2011   BUN 32* 06/11/2011   CO2 22 06/11/2011   TSH 1.23 04/17/2010   PSA 0.99 04/17/2010   INR 1.0 01/21/2009   HGBA1C 6.4 06/11/2011        Assessment & Plan:

## 2011-07-07 ENCOUNTER — Encounter: Payer: Self-pay | Admitting: Internal Medicine

## 2011-07-07 ENCOUNTER — Other Ambulatory Visit: Payer: Medicare Other | Admitting: Internal Medicine

## 2011-07-07 ENCOUNTER — Ambulatory Visit (INDEPENDENT_AMBULATORY_CARE_PROVIDER_SITE_OTHER): Payer: Medicare Other | Admitting: Internal Medicine

## 2011-07-07 ENCOUNTER — Ambulatory Visit (AMBULATORY_SURGERY_CENTER): Payer: Medicare Other | Admitting: Internal Medicine

## 2011-07-07 VITALS — BP 148/96 | HR 64 | Temp 96.9°F | Resp 14 | Ht 69.0 in | Wt 190.0 lb

## 2011-07-07 VITALS — BP 168/98 | HR 80 | Temp 97.3°F | Ht 69.0 in

## 2011-07-07 DIAGNOSIS — K2270 Barrett's esophagus without dysplasia: Secondary | ICD-10-CM

## 2011-07-07 DIAGNOSIS — K2970 Gastritis, unspecified, without bleeding: Secondary | ICD-10-CM

## 2011-07-07 DIAGNOSIS — I10 Essential (primary) hypertension: Secondary | ICD-10-CM

## 2011-07-07 DIAGNOSIS — K449 Diaphragmatic hernia without obstruction or gangrene: Secondary | ICD-10-CM

## 2011-07-07 DIAGNOSIS — D379 Neoplasm of uncertain behavior of digestive organ, unspecified: Secondary | ICD-10-CM

## 2011-07-07 HISTORY — PX: UPPER GASTROINTESTINAL ENDOSCOPY: SHX188

## 2011-07-07 LAB — GLUCOSE, CAPILLARY: Glucose-Capillary: 109 mg/dL — ABNORMAL HIGH (ref 70–99)

## 2011-07-07 MED ORDER — SODIUM CHLORIDE 0.9 % IV SOLN: 500.0000 mL | INTRAVENOUS | Status: DC

## 2011-07-07 NOTE — Patient Instructions (Addendum)
The Barrett's esophagus is still there, as expected. I did not see anything suspicious but will need to see what the pathologists tells [place]. I will send you a letter as before.  Iva Boop, MD, [facility]

## 2011-07-07 NOTE — Progress Notes (Signed)
  Subjective:    Patient ID: [name], male    DOB: [birth year], 70 y.o.   MRN: [id number]  HPI  complains of elevated blood pressure readings Recent problems with same - working with renal on labetalol titration This AM, marked and sustained increase in BP following EGD - referred to PCP for eval/tx of same Feels well - took AM meds before procedure but has missed lunch dose of beta-blocker.  Past Medical History  Diagnosis Date  . Renal insufficiency 2010  . Diabetes mellitus   . Hypertension   . Anxiety   . COPD (chronic obstructive pulmonary disease)   . Hyperlipemia   . GERD (gastroesophageal reflux disease)   . Barrett esophagus   . PVD (peripheral vascular disease)   . CAD (coronary artery disease)   . Tobacco user   . Depression     Review of Systems  Respiratory: Negative for shortness of breath.   Cardiovascular: Negative for chest pain and palpitations.  Neurological: Negative for dizziness and headaches.       Objective:   Physical Exam BP 168/98  Pulse 80  Temp(Src) 97.3 F (36.3 C) (Oral)  Ht 5\' 9"  (1.753 m)  SpO2 97% Manual recheck 166/88 by me BP Readings from Last 3 Encounters:  07/07/11 168/98  07/07/11 148/96  06/19/11 120/88   Constitutional:  oriented to person, place, and time. appears well-developed and well-nourished. No distress. Wife at side Cardiovascular: Normal rate, regular rhythm and normal heart sounds.  No murmur heard. no BLE edema Pulmonary/Chest: Effort normal and breath sounds normal. No respiratory distress. no wheezes. Neurological: he is alert and oriented to person, place, and time. No cranial nerve deficit. Coordination normal.  Psychiatric: he has a normal mood and affect. behavior is normal. Judgment and thought content normal.   Lab Results  Component Value Date   WBC 10.2 12/05/2010   HGB 10.5* 12/05/2010   HCT 32.3* 12/05/2010   PLT 186 12/05/2010   CHOL 170 02/10/2011   TRIG 170.0* 02/10/2011   HDL 45.90 02/10/2011     LDLDIRECT 92.8 04/17/2010   ALT 14 12/05/2010   AST 18 12/05/2010   NA 139 06/11/2011   K 4.9 06/11/2011   CL 109 06/11/2011   CREATININE 2.7* 06/11/2011   BUN 32* 06/11/2011   CO2 22 06/11/2011   TSH 1.23 04/17/2010   PSA 0.99 04/17/2010   INR 1.0 01/21/2009   HGBA1C 6.4 06/11/2011        Assessment & Plan:  See problem list. Medications and labs reviewed today.

## 2011-07-07 NOTE — Patient Instructions (Signed)
It was good to see you today. We have rechecked your blood pressure and it is coming down but not normal yet Increase labetalol to 200mg  4x/day and check your blood pressure readings at home (or make nurse visit here for blood pressure check) Call us or Dr. Hyman Hopes if SBP>160 Keep visitwith Dr. Hyman Hopes 9/14 as discussed

## 2011-07-07 NOTE — Progress Notes (Signed)
IV Right Wrist #22 placed by Morrell Riddle, RN one attempt.  Pt tolerated the stick well. No complaints.  Maw  Pt blood pressure elevated throughout procedure, MD aware. Prior to procedure pt states that he did take his blood pressure medication this morning.

## 2011-07-07 NOTE — Progress Notes (Signed)
  BP was persistently elevated even after procedure. He will see primary care at 1145 today for reassessment.

## 2011-07-07 NOTE — Assessment & Plan Note (Signed)
Exacerbated today with EGD - reviewed nurse endo readings - Since in our office, blood pressure improved but not normalized asymptomatic and exam otherwise benign Titration ongoing with renal (webb) - complicated by CKD Increase beta-blocker to qid - has follow up with renal 9/14 Pt will monitor home or nurse visit in next 48h - to call if SBP>160

## 2011-07-08 ENCOUNTER — Telehealth: Payer: Self-pay

## 2011-07-08 NOTE — Telephone Encounter (Signed)
No ID on answering machine. 

## 2011-07-09 ENCOUNTER — Ambulatory Visit: Payer: Medicare Other | Admitting: Internal Medicine

## 2011-07-15 ENCOUNTER — Telehealth: Payer: Self-pay

## 2011-07-15 ENCOUNTER — Telehealth: Payer: Self-pay | Admitting: Internal Medicine

## 2011-07-15 NOTE — Telephone Encounter (Signed)
Patient advised.  REV scheduled for 08/17/11

## 2011-07-15 NOTE — Telephone Encounter (Signed)
Message copied by Annett Fabian on Wed Jul 15, 2011 10:23 AM ------      Message from: Stan Head E      Created: Wed Jul 15, 2011 10:03 AM       Office - call patient and tell him there are changes towards cancer again (dysplasia)      I need him to see me (not urgent) in office to discuss this            [facility] no letter - place a 3 month recall for EGD

## 2011-07-15 NOTE — Telephone Encounter (Signed)
All questions from the patient answered

## 2011-07-15 NOTE — Progress Notes (Signed)
Quick Note:  Office - call patient and tell him there are changes towards cancer again (dysplasia) I need him to see me (not urgent) in office to discuss this  [facility] no letter - place a 3 month recall for EGD ______

## 2011-07-25 ENCOUNTER — Other Ambulatory Visit: Payer: Self-pay | Admitting: Internal Medicine

## 2011-07-27 ENCOUNTER — Telehealth: Payer: Self-pay | Admitting: *Deleted

## 2011-07-27 ENCOUNTER — Other Ambulatory Visit: Payer: Self-pay | Admitting: Internal Medicine

## 2011-07-27 NOTE — Telephone Encounter (Signed)
Requested Medications     nortriptyline (PAMELOR) 10 MG capsule [Pharmacy Med Name: NORTRIPTYLINE HCL 10 MG CAP]    TAKE 1 TO 2 CAPSULES BY MOUTH AT BEDTIME    Disp: 60 capsule R: 6 Start: 07/27/2011 Class: Normal    Originally ordered on: 06/19/2011 Last refill: 06/29/2011 Order History         Pharmacy     [id number] [place], [place] - [address]    [address] [place] [address]    Phone: [phone] Fax: [phone]

## 2011-07-27 NOTE — Telephone Encounter (Signed)
OK to fill this prescription with additional refills x11 Thank you!  

## 2011-07-28 MED ORDER — NORTRIPTYLINE HCL 10 MG PO CAPS: 10.0000 mg | ORAL_CAPSULE | Freq: Every day | ORAL | Status: DC

## 2011-08-14 ENCOUNTER — Encounter: Payer: Self-pay | Admitting: Vascular Surgery

## 2011-08-17 ENCOUNTER — Encounter: Payer: Self-pay | Admitting: Internal Medicine

## 2011-08-17 ENCOUNTER — Ambulatory Visit (INDEPENDENT_AMBULATORY_CARE_PROVIDER_SITE_OTHER): Payer: Medicare Other | Admitting: Internal Medicine

## 2011-08-17 VITALS — BP 170/110 | HR 87 | Ht 69.0 in | Wt 191.0 lb

## 2011-08-17 DIAGNOSIS — K2270 Barrett's esophagus without dysplasia: Secondary | ICD-10-CM

## 2011-08-17 NOTE — Progress Notes (Signed)
This is a 70 year old white man with long-standing GERD and Barrett's esophagus. Over the past year or so he has had low-grade dysplasia. On his most recent EGD there was a focus of high-grade dysplasia and low-grade dysplasia on biopsies. There were no nodular lesions or ulcers. He reports no heartburn or reflux symptoms on his Prilosec 40 mg twice a day. There is no dysphagia.  I discussed the implications of this with him today. It looks like his Barrett's esophagus is progressing towards cancer though there was only one small focus of high-grade dysplasia at this time. 15 minutes time spent with the patient regarding this today.  Past Medical History  Diagnosis Date  . Renal insufficiency 2010  . Diabetes mellitus   . Hypertension   . Anxiety   . COPD (chronic obstructive pulmonary disease)   . Hyperlipemia   . GERD (gastroesophageal reflux disease)   . Barrett esophagus   . PVD (peripheral vascular disease)   . CAD (coronary artery disease)   . Tobacco user   . Depression   . Hiatal hernia   . Myocardial infarction 12/1996  . AAA (abdominal aortic aneurysm)    Past Surgical History  Procedure Date  . Tonsillectomy   . Nissen fundoplication   . Abdominal aortic aneurysm repair 2010    and right common iliac artery aneurysm, DR HAYES  . Upper gastrointestinal endoscopy 07/07/2011    barretts esophagus, hiatal hernia, gastritis  . Colonoscopy 05/07/2005    diverticulosis, internal and external hemorrhoids  . Abdominal aortic aneurysm repair     reports that he has been smoking Cigarettes.  He has a 54 pack-year smoking history. He has never used smokeless tobacco. He reports that he does not drink alcohol or use illicit drugs. family history includes Asthma in his other; Cancer in his father and mother; Coronary artery disease in his other; Diabetes in his father and other; Heart disease in his father; Mental illness in his mother; and Stomach cancer in his father.  There is no  history of Colon cancer and Esophageal cancer. Allergies  Allergen Reactions  . Tramadol Nausea And Vomiting  . Amlodipine Besylate     REACTION: swelling  . Benazepril Hcl     REACTION: elev K  . Penicillins     REACTION: rash, itching  . Risperidone     REACTION: loss of motor skills  . Valsartan     REACTION: elev K      Current outpatient prescriptions:ALPRAZolam (XANAX) 0.5 MG tablet, Take 1 tablet (0.5 mg total) by mouth 2 (two) times daily as needed., Disp: 60 tablet, Rfl: 5;  aspirin 81 MG EC tablet, Take 81 mg by mouth daily.  , Disp: , Rfl: ;  busPIRone (BUSPAR) 15 MG tablet, TAKE 1/2 TABLET TWICE A DAY FOR ANXIETY, Disp: 30 tablet, Rfl: 5;  Cholecalciferol 1000 UNITS tablet, Take 1,000 Units by mouth daily.  , Disp: , Rfl:  gabapentin (NEURONTIN) 300 MG capsule, Take 1 capsule (300 mg total) by mouth 3 (three) times daily., Disp: 90 capsule, Rfl: 5;  glimepiride (AMARYL) 1 MG tablet, Take 1 mg by mouth daily before breakfast.  , Disp: , Rfl: ;  labetalol (NORMODYNE) 200 MG tablet, Take 1 tablet (200 mg total) by mouth 4 (four) times daily., Disp: , Rfl:  nortriptyline (PAMELOR) 10 MG capsule, Take 1-2 capsules (10-20 mg total) by mouth at bedtime., Disp: 60 capsule, Rfl: 11;  omeprazole (PRILOSEC) 40 MG capsule, Take 1 capsule (40 mg total) by  mouth 2 (two) times daily., Disp: 180 capsule, Rfl: 3;  PARoxetine (PAXIL) 20 MG tablet, Take 20 mg by mouth daily.  , Disp: , Rfl: ;  simvastatin (ZOCOR) 40 MG tablet, Take 40 mg by mouth at bedtime.  , Disp: , Rfl:  sodium bicarbonate 650 MG tablet, Take 1,300 mg by mouth daily.  , Disp: , Rfl: ;  temazepam (RESTORIL) 15 MG capsule, Take 15-30 mg by mouth at bedtime as needed.  , Disp: , Rfl: ;  triamterene-hydrochlorothiazide (MAXZIDE-25) 37.5-25 MG per tablet, TAKE 1 TABLET EVERY MORNING, Disp: 90 tablet, Rfl: 3 Current facility-administered medications:0.9 %  sodium chloride infusion, 500 mL, Intravenous, Continuous, Iva Boop, MD

## 2011-08-17 NOTE — Assessment & Plan Note (Signed)
Continue PPI therapy, twice a day. Will work for her to Dr. Margaretha Glassing at [facility], regarding the possibility of radiofrequency ablation of his Barrett's esophagus. I did have some discussion with him regarding the pros and cons of this today. I would expect at least one more EGD and biopsy though that may not be necessary prior to proceeding to therapy I suspect it may be, but ultimately will defer to Dr. Margaretha Glassing.   I have discussed the possibility of stricture formation, bleeding possible perforation related to these techniques, but that it is preferable to surgery. I also explained that so far this appears to be a relatively slow moving process and there are no signs of cancer at this point. However it is clear that there is a high risk of cancer in this setting and that is why the referral is being made.

## 2011-08-17 NOTE — Patient Instructions (Addendum)
You have been given a separate informational sheet regarding your tobacco use, the importance of quitting and local resources to help you quit.  We will contact you about an appointment at [facility] for your Barrett's Esophagus.

## 2011-08-18 ENCOUNTER — Telehealth: Payer: Self-pay

## 2011-08-18 DIAGNOSIS — K22711 Barrett's esophagus with high grade dysplasia: Secondary | ICD-10-CM

## 2011-08-18 NOTE — Telephone Encounter (Signed)
Dr Margaretha Glassing requires record review prior to scheduling.  Records have been faxed.

## 2011-08-18 NOTE — Telephone Encounter (Signed)
Message copied by Annett Fabian on Tue Aug 18, 2011  9:21 AM ------      Message from: Iva Boop      Created: Mon Aug 17, 2011  5:14 PM       Needs referral to Dr. Harlen Labs at [facility] re: Barrett's with high-grade dysplasia      Let me know when      We need a recall for rev in Jan 2013 to be safe            I sent you most recent note

## 2011-08-20 ENCOUNTER — Telehealth: Payer: Self-pay | Admitting: *Deleted

## 2011-08-20 MED ORDER — TEMAZEPAM 15 MG PO CAPS
15.0000 mg | ORAL_CAPSULE | Freq: Every evening | ORAL | Status: DC | PRN
Start: 2011-08-20 — End: 2011-09-16

## 2011-08-20 MED ORDER — ALPRAZOLAM 0.5 MG PO TABS: 0.5000 mg | ORAL_TABLET | Freq: Two times a day (BID) | ORAL | Status: DC | PRN

## 2011-08-20 NOTE — Telephone Encounter (Signed)
Done hardcopy to dahlia/LIM B  

## 2011-08-20 NOTE — Telephone Encounter (Signed)
Rf req for Alprazolam 0.5mg  # 60  Was last filled 07-21-11  AND Temazepam 15 mg # 60 last filled 07-27-11. Ok to Rf in PCP's absence?

## 2011-08-21 NOTE — Telephone Encounter (Signed)
Rx faxed to pharmacy  

## 2011-08-25 NOTE — Telephone Encounter (Signed)
Checked with [facility] they state they don't have the referral that was sent on 08/17/11.  I have faxed the records again for review.

## 2011-08-28 ENCOUNTER — Telehealth: Payer: Self-pay | Admitting: Internal Medicine

## 2011-08-28 NOTE — Telephone Encounter (Signed)
Patient is scheduled with Dr Margaretha Glassing for 11/15/121:30.  The patient is aware of the appt date and time

## 2011-08-28 NOTE — Telephone Encounter (Signed)
Patient advised that still waiting for the appt form [facility] they are on Dr Dian Situ desk for review.  I have left him a voicemail that I will call him with an appt date and time as soon as they contact me back

## 2011-08-28 NOTE — Telephone Encounter (Signed)
Dr Leone Payor here is his appt info for [facility].

## 2011-09-03 ENCOUNTER — Ambulatory Visit (INDEPENDENT_AMBULATORY_CARE_PROVIDER_SITE_OTHER): Payer: Medicare Other | Admitting: Cardiovascular Disease

## 2011-09-03 ENCOUNTER — Encounter: Payer: Self-pay | Admitting: Cardiovascular Disease

## 2011-09-03 VITALS — BP 148/90 | HR 72 | Ht 69.0 in | Wt 192.0 lb

## 2011-09-03 DIAGNOSIS — I10 Essential (primary) hypertension: Secondary | ICD-10-CM

## 2011-09-03 DIAGNOSIS — I2510 Atherosclerotic heart disease of native coronary artery without angina pectoris: Secondary | ICD-10-CM

## 2011-09-03 NOTE — Assessment & Plan Note (Signed)
Stable. He is on a statin, ASA and beta blocker. No signs or symptoms of angina or CHF. No changes.

## 2011-09-03 NOTE — Assessment & Plan Note (Signed)
Elevated today. He does not wish to change his medications. He has been intolerant to ARB, Ace-inh and Norvasc in past. He will check several times over next week and bring with him to primary care follow up in two weeks.

## 2011-09-03 NOTE — Progress Notes (Signed)
History of Present Illness:70 yo male with history of CAD, HTN, HLD, DM, CRI, AAA s/p repair, tobacco abuse, GERD and Barretts esophagus here today for cardiac follow up. He has been followed in the past by Dr. Juanda Chance. In 1998 he had a bare metal stent placed to the right coronary artery. His last cardiac catheterization was in 2001 at which time he had nonobstructive coronary disease. He was seen by Dr. 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 Carotid artery dopplers were negative. He has been intolerant to several medications  for his blood pressure in the past. He also has chronic renal insufficiency followed by Dr. Hyman Hopes with a creatinine that is about 2.5. He also had an abdominal aortic aneurysm and iliac aneurysm repair in March by Dr. Madilyn Fireman. He does continue to smoke about one half pack of cigarettes per day. 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  His last lipid profile was  TC 170  HDL  46    LDL 90  April 2012. Primary care is Dr. [name]. Lipids and HTN managed in primary care.   He tells me that he is feeling well. No chest pain or SOB. No palpitations. Tolerating all meds.     Past Medical History  Diagnosis Date  . Renal insufficiency 2010  . Diabetes mellitus   . Hypertension   . Anxiety   . COPD (chronic obstructive pulmonary disease)   . Hyperlipemia   . GERD (gastroesophageal reflux disease)   . Barrett esophagus   . PVD (peripheral vascular disease)   . CAD (coronary artery disease)   . Tobacco user   . Depression   . Hiatal hernia   . Myocardial infarction 12/1996  . AAA (abdominal aortic aneurysm)     Past Surgical History  Procedure Date  . Tonsillectomy   . Nissen fundoplication   . Abdominal aortic aneurysm repair 2010    and right common iliac artery aneurysm, DR HAYES  . Upper gastrointestinal endoscopy 07/07/2011    barretts esophagus, hiatal hernia, gastritis  . Colonoscopy 05/07/2005    diverticulosis,  internal and external hemorrhoids  . Abdominal aortic aneurysm repair     Current Outpatient Prescriptions  Medication Sig Dispense Refill  . ALPRAZolam (XANAX) 0.5 MG tablet Take 1 tablet (0.5 mg total) by mouth 2 (two) times daily as needed.  60 tablet  0  . aspirin 81 MG EC tablet Take 81 mg by mouth daily.        . busPIRone (BUSPAR) 15 MG tablet TAKE 1/2 TABLET TWICE A DAY FOR ANXIETY  30 tablet  5  . Cholecalciferol 1000 UNITS tablet Take 1,000 Units by mouth daily.        Marland Kitchen gabapentin (NEURONTIN) 300 MG capsule Take 1 capsule (300 mg total) by mouth 3 (three) times daily.  90 capsule  5  . glimepiride (AMARYL) 1 MG tablet Take 1 mg by mouth daily before breakfast.        . labetalol (NORMODYNE) 200 MG tablet Take 1 tablet (200 mg total) by mouth 4 (four) times daily.      . nortriptyline (PAMELOR) 10 MG capsule Take 1-2 capsules (10-20 mg total) by mouth at bedtime.  60 capsule  11  . omeprazole (PRILOSEC) 40 MG capsule Take 1 capsule (40 mg total) by mouth 2 (two) times daily.  180 capsule  3  . PARoxetine (PAXIL) 20 MG tablet Take 20 mg by mouth daily.        Marland Kitchen  simvastatin (ZOCOR) 40 MG tablet Take 40 mg by mouth at bedtime.        . sodium bicarbonate 650 MG tablet Take 1,300 mg by mouth daily.        . temazepam (RESTORIL) 15 MG capsule Take 1-2 capsules (15-30 mg total) by mouth at bedtime as needed.  60 capsule  0  . triamterene-hydrochlorothiazide (MAXZIDE-25) 37.5-25 MG per tablet TAKE 1 TABLET EVERY MORNING  90 tablet  3   Current Facility-Administered Medications  Medication Dose Route Frequency Provider Last Rate Last Dose  . 0.9 %  sodium chloride infusion  500 mL Intravenous Continuous Iva Boop, MD        Allergies  Allergen Reactions  . Tramadol Nausea And Vomiting  . Amlodipine Besylate     REACTION: swelling  . Benazepril Hcl     REACTION: elev K  . Penicillins     REACTION: rash, itching  . Risperidone     REACTION: loss of motor skills  . Valsartan      REACTION: elev K    History   Social History  . Marital Status: Married    Spouse Name: N/A    Number of Children: 3  . Years of Education: N/A   Occupational History  . Retired Retail banker    Social History Main Topics  . Smoking status: Current Everyday Smoker -- 1.0 packs/day for 54 years    Types: Cigarettes  . Smokeless tobacco: Never Used  . Alcohol Use: No  . Drug Use: No  . Sexually Active: Not on file   Other Topics Concern  . Not on file   Social History Narrative   Regular Exercise -  NO    Family History  Problem Relation Age of Onset  . Coronary artery disease Other   . Diabetes Other   . Asthma Other   . Mental illness Mother     dementia  . Cancer Mother   . Heart disease Father   . Diabetes Father   . Stomach cancer Father   . Cancer Father   . Colon cancer Neg Hx   . Esophageal cancer Neg Hx     Review of Systems:  As stated in the HPI and otherwise negative.   BP 148/90  Pulse 72  Ht 5\' 9"  (1.753 m)  Wt 192 lb (87.091 kg)  BMI 28.35 kg/m2  Physical Examination: General: Well developed, well nourished, NAD HEENT: OP clear, mucus membranes moist SKIN: warm, dry. No rashes. Neuro: No focal deficits Musculoskeletal: Muscle strength 5/5 all ext Psychiatric: Mood and affect normal Neck: No JVD, no carotid bruits, no thyromegaly, no lymphadenopathy. Lungs:Clear bilaterally, no wheezes, rhonci, crackles Cardiovascular: Regular rate and rhythm. No murmurs, gallops or rubs. Abdomen:Soft. Bowel sounds present. Non-tender.  Extremities: No lower extremity edema. Pulses are 2 + in the bilateral DP/PT.  ZOX:WRUEAV sinus rhythm. Rate 72 bpm.

## 2011-09-03 NOTE — Patient Instructions (Signed)
Your physician wants you to follow-up in:  12 months.  You will receive a reminder letter in the mail two months in advance. If you don't receive a letter, please call our office to schedule the follow-up appointment.   

## 2011-09-06 ENCOUNTER — Other Ambulatory Visit: Payer: Self-pay | Admitting: Internal Medicine

## 2011-09-07 ENCOUNTER — Telehealth: Payer: Self-pay | Admitting: *Deleted

## 2011-09-07 NOTE — Telephone Encounter (Signed)
Requested Medications     fluconazole (DIFLUCAN) 100 MG tablet [Pharmacy Med Name: FLUCONAZOLE 100 MG TABLET]   TAKE 2 TABLETS ON THE FIRST DAY,THEN 1 TAB ONCE DAILY UNTIL GONE FOR FUNGAL INFECTION   Disp: 11 tablet R: 1 Start: 09/06/2011  Class: Normal   Last refill: 12/30/2010

## 2011-09-07 NOTE — Telephone Encounter (Signed)
Why does he need it to be refilled? Thx

## 2011-09-08 MED ORDER — FLUCONAZOLE 100 MG PO TABS: ORAL_TABLET | ORAL | Status: DC

## 2011-09-08 NOTE — Telephone Encounter (Signed)
RX filled and pt notified.

## 2011-09-08 NOTE — Telephone Encounter (Signed)
Previous fungal infection in throat.  Pt stated he had the same sore throat as he has had with previous infections.  Would like refill.  Pt is going to GI at [facility] for Barrett's also.

## 2011-09-08 NOTE — Telephone Encounter (Signed)
Ok to fill Thx 

## 2011-09-15 ENCOUNTER — Telehealth: Payer: Self-pay | Admitting: Student

## 2011-09-15 NOTE — Telephone Encounter (Signed)
Rf request for Temazapam 15mg  1-2 qhs prn 60. Last refilled 08/21/11. Ok to refill?

## 2011-09-15 NOTE — Telephone Encounter (Signed)
OK to fill this prescription with additional refills x4 Thank you!  

## 2011-09-16 MED ORDER — TEMAZEPAM 15 MG PO CAPS: 15.0000 mg | ORAL_CAPSULE | Freq: Every evening | ORAL | Status: DC | PRN

## 2011-09-17 ENCOUNTER — Other Ambulatory Visit: Payer: Self-pay | Admitting: Internal Medicine

## 2011-09-17 ENCOUNTER — Other Ambulatory Visit (INDEPENDENT_AMBULATORY_CARE_PROVIDER_SITE_OTHER): Payer: Medicare Other

## 2011-09-17 DIAGNOSIS — N259 Disorder resulting from impaired renal tubular function, unspecified: Secondary | ICD-10-CM

## 2011-09-17 DIAGNOSIS — E785 Hyperlipidemia, unspecified: Secondary | ICD-10-CM

## 2011-09-17 DIAGNOSIS — E119 Type 2 diabetes mellitus without complications: Secondary | ICD-10-CM

## 2011-09-17 LAB — COMPREHENSIVE METABOLIC PANEL
ALT: 20 U/L (ref 0–53)
AST: 20 U/L (ref 0–37)
Alkaline Phosphatase: 102 U/L (ref 39–117)
Calcium: 8.8 mg/dL (ref 8.4–10.5)
Chloride: 113 mEq/L — ABNORMAL HIGH (ref 96–112)
Creatinine, Ser: 3.3 mg/dL — ABNORMAL HIGH (ref 0.4–1.5)
Potassium: 5.9 mEq/L — ABNORMAL HIGH (ref 3.5–5.1)

## 2011-09-17 LAB — LIPID PANEL
HDL: 39.1 mg/dL (ref >39.00)
Total CHOL/HDL Ratio: 6

## 2011-09-17 LAB — LDL CHOLESTEROL, DIRECT: Direct LDL: 145.1 mg/dL

## 2011-09-17 LAB — HEMOGLOBIN A1C: Hgb A1c MFr Bld: 6.6 % — ABNORMAL HIGH (ref 4.6–6.5)

## 2011-09-22 ENCOUNTER — Telehealth: Payer: Self-pay | Admitting: Internal Medicine

## 2011-09-22 ENCOUNTER — Telehealth: Payer: Self-pay | Admitting: *Deleted

## 2011-09-22 ENCOUNTER — Other Ambulatory Visit: Payer: Medicare Other

## 2011-09-22 ENCOUNTER — Ambulatory Visit: Payer: Medicare Other | Admitting: Vascular Surgery

## 2011-09-22 NOTE — Telephone Encounter (Signed)
Patient thought that we were trying to call him.  It was not our office best I can tell

## 2011-09-22 NOTE — Telephone Encounter (Signed)
OK to fill this prescription with additional refills x2 Keep rov Thank you!

## 2011-09-22 NOTE — Telephone Encounter (Signed)
Rf req for Alprazolam 0.5 mg 1 po bid prn # 60. Last filled 10.19.12. Ok to Rf?

## 2011-09-23 ENCOUNTER — Ambulatory Visit (INDEPENDENT_AMBULATORY_CARE_PROVIDER_SITE_OTHER): Payer: Medicare Other | Admitting: Internal Medicine

## 2011-09-23 ENCOUNTER — Encounter: Payer: Self-pay | Admitting: Internal Medicine

## 2011-09-23 VITALS — BP 150/90 | HR 80 | Temp 97.8°F | Resp 16 | Wt 192.0 lb

## 2011-09-23 DIAGNOSIS — E119 Type 2 diabetes mellitus without complications: Secondary | ICD-10-CM

## 2011-09-23 DIAGNOSIS — I739 Peripheral vascular disease, unspecified: Secondary | ICD-10-CM

## 2011-09-23 DIAGNOSIS — I2510 Atherosclerotic heart disease of native coronary artery without angina pectoris: Secondary | ICD-10-CM

## 2011-09-23 DIAGNOSIS — N259 Disorder resulting from impaired renal tubular function, unspecified: Secondary | ICD-10-CM

## 2011-09-23 DIAGNOSIS — J449 Chronic obstructive pulmonary disease, unspecified: Secondary | ICD-10-CM

## 2011-09-23 DIAGNOSIS — I10 Essential (primary) hypertension: Secondary | ICD-10-CM

## 2011-09-23 DIAGNOSIS — K2270 Barrett's esophagus without dysplasia: Secondary | ICD-10-CM

## 2011-09-23 DIAGNOSIS — F17200 Nicotine dependence, unspecified, uncomplicated: Secondary | ICD-10-CM

## 2011-09-23 DIAGNOSIS — E785 Hyperlipidemia, unspecified: Secondary | ICD-10-CM

## 2011-09-23 MED ORDER — ATORVASTATIN CALCIUM 40 MG PO TABS: 40.0000 mg | ORAL_TABLET | Freq: Every day | ORAL | Status: DC

## 2011-09-23 MED ORDER — ALPRAZOLAM 0.5 MG PO TABS: 0.5000 mg | ORAL_TABLET | Freq: Two times a day (BID) | ORAL | Status: DC | PRN

## 2011-09-23 NOTE — Assessment & Plan Note (Signed)
Continue PPI therapy, twice a day.  --- the possibility of radiofrequency ablation of his Barrett's esophagus

## 2011-09-23 NOTE — Telephone Encounter (Signed)
Pt was given written rx for this med today at [facility].

## 2011-09-23 NOTE — Assessment & Plan Note (Addendum)
Not well controlled - change to Lipitor

## 2011-09-23 NOTE — Assessment & Plan Note (Signed)
Continue with current prescription therapy as reflected on the Med list.  

## 2011-09-23 NOTE — Assessment & Plan Note (Signed)
Discussed.

## 2011-09-23 NOTE — Assessment & Plan Note (Signed)
Continue with current prescription therapy as reflected on the Med list. Creat was 3.3

## 2011-09-23 NOTE — Progress Notes (Signed)
  Subjective:    Patient ID: [name], male    DOB: 1941/05/10, 70 y.o.   MRN: [id number]  HPI  The patient presents for a follow-up of  chronic hypertension, chronic dyslipidemia, type 2 diabetes, Barrett's, CAD controlled with medicines    Review of Systems  Constitutional: Negative for appetite change, fatigue and unexpected weight change.  HENT: Negative for nosebleeds, congestion, sore throat, sneezing, trouble swallowing and neck pain.   Eyes: Negative for itching and visual disturbance.  Respiratory: Negative for cough.   Cardiovascular: Negative for chest pain, palpitations and leg swelling.  Gastrointestinal: Negative for nausea, diarrhea, blood in stool and abdominal distention.  Genitourinary: Negative for frequency and hematuria.  Musculoskeletal: Negative for back pain, joint swelling and gait problem.  Skin: Negative for rash.  Neurological: Negative for dizziness, tremors, speech difficulty and weakness.  Psychiatric/Behavioral: Negative for sleep disturbance, dysphoric mood and agitation. The patient is not nervous/anxious.    Wt Readings from Last 3 Encounters:  09/23/11 192 lb (87.091 kg)  09/03/11 192 lb (87.091 kg)  08/17/11 191 lb (86.637 kg)        Objective:   Physical Exam  Constitutional: He is oriented to person, place, and time. He appears well-developed.  HENT:  Mouth/Throat: Oropharynx is clear and moist.  Eyes: Conjunctivae are normal. Pupils are equal, round, and reactive to light.  Neck: Normal range of motion. No JVD present. No thyromegaly present.  Cardiovascular: Normal rate, regular rhythm, normal heart sounds and intact distal pulses.  Exam reveals no gallop and no friction rub.   No murmur heard. Pulmonary/Chest: Effort normal and breath sounds normal. No respiratory distress. He has no wheezes. He has no rales. He exhibits no tenderness.  Abdominal: Soft. Bowel sounds are normal. He exhibits no distension and no mass. There is no  tenderness. There is no rebound and no guarding.  Musculoskeletal: Normal range of motion. He exhibits no edema and no tenderness.  Lymphadenopathy:    He has no cervical adenopathy.  Neurological: He is alert and oriented to person, place, and time. He has normal reflexes. No cranial nerve deficit. He exhibits normal muscle tone. Coordination normal.  Skin: Skin is warm and dry. No rash noted.  Psychiatric: He has a normal mood and affect. His behavior is normal. Judgment and thought content normal.   Lab Results  Component Value Date   WBC 10.2 12/05/2010   HGB 10.5* 12/05/2010   HCT 32.3* 12/05/2010   PLT 186 12/05/2010   GLUCOSE 117* 09/17/2011   CHOL 245* 09/17/2011   TRIG 367.0* 09/17/2011   HDL 39.10 09/17/2011   LDLDIRECT 145.1 09/17/2011   LDLCALC 90 02/10/2011   ALT 20 09/17/2011   AST 20 09/17/2011   NA 140 09/17/2011   K 5.9* 09/17/2011   CL 113* 09/17/2011   CREATININE 3.3* 09/17/2011   BUN 37* 09/17/2011   CO2 17* 09/17/2011   TSH 1.23 04/17/2010   PSA 0.99 04/17/2010   INR 1.0 01/21/2009   HGBA1C 6.6* 09/17/2011          Assessment & Plan:

## 2011-09-29 ENCOUNTER — Encounter: Payer: Self-pay | Admitting: Vascular Surgery

## 2011-09-30 ENCOUNTER — Ambulatory Visit (INDEPENDENT_AMBULATORY_CARE_PROVIDER_SITE_OTHER): Payer: Medicare Other | Admitting: Vascular Surgery

## 2011-09-30 ENCOUNTER — Encounter: Payer: Self-pay | Admitting: Vascular Surgery

## 2011-09-30 VITALS — BP 166/95 | HR 74 | Resp 16 | Ht 69.0 in | Wt 191.0 lb

## 2011-09-30 DIAGNOSIS — I714 Abdominal aortic aneurysm, without rupture: Secondary | ICD-10-CM

## 2011-09-30 NOTE — Progress Notes (Signed)
[facility] [place]  Patient name: [name] MRN: [id number] DOB: 1941/01/14 Sex: male  CC: Follow up of EVAR  HPI: [name] is a 70 y.o. male who underwent endovascular repair of an abdominal aortic aneurysm in right common iliac artery aneurysm by Dr. Madilyn Fireman on 01/21/2009. He comes in for a routine follow up visit. He has had no significant abdominal pain or back pain. A CT scan today and brings the disc with him.  He does have a history of Barrett's esophagus and is being evaluated at [facility] for radioablation therapy. He has a history of diabetes hypertension and hyperlipidemia which are stable on his current medications.  Past Medical History  Diagnosis Date  . Renal insufficiency 2010  . Diabetes mellitus   . Hypertension   . Anxiety   . COPD (chronic obstructive pulmonary disease)   . Hyperlipemia   . GERD (gastroesophageal reflux disease)   . Barrett esophagus   . PVD (peripheral vascular disease)   . CAD (coronary artery disease)   . Tobacco user   . Depression   . Hiatal hernia   . Myocardial infarction 12/1996  . AAA (abdominal aortic aneurysm)     Family History  Problem Relation Age of Onset  . Coronary artery disease Other   . Diabetes Other   . Asthma Other   . Mental illness Mother     dementia  . Cancer Mother   . Heart disease Father   . Diabetes Father   . Stomach cancer Father   . Cancer Father   . Colon cancer Neg Hx   . Esophageal cancer Neg Hx     SOCIAL HISTORY: History  Substance Use Topics  . Smoking status: Current Everyday Smoker -- 1.0 packs/day for 54 years    Types: Cigarettes  . Smokeless tobacco: Never Used  . Alcohol Use: No    Allergies  Allergen Reactions  . Tramadol Nausea And Vomiting  . Amlodipine Besylate     REACTION: swelling  . Benazepril Hcl     REACTION: elev K  . Penicillins     REACTION: rash, itching  . Risperidone     REACTION: loss of motor skills  . Valsartan     REACTION: elev K    Current Outpatient Prescriptions  Medication Sig Dispense Refill  . ALPRAZolam (XANAX) 0.5 MG tablet Take 1 tablet (0.5 mg total) by mouth 2 (two) times daily as needed.  60 tablet  3  . aspirin 81 MG EC tablet Take 81 mg by mouth daily.        Marland Kitchen atorvastatin (LIPITOR) 40 MG tablet Take 1 tablet (40 mg total) by mouth daily.  90 tablet  3  . busPIRone (BUSPAR) 15 MG tablet TAKE 1/2 TABLET TWICE A DAY FOR ANXIETY  30 tablet  5  . busPIRone (BUSPAR) 5 MG tablet daily.       . Cholecalciferol 1000 UNITS tablet Take 1,000 Units by mouth daily.        . fluconazole (DIFLUCAN) 100 MG tablet TAKE 2 TABLETS ON THE FIRST DAY,THEN 1 TAB ONCE DAILY UNTIL GONE FOR FUNGAL INFECTION  11 tablet  0  . gabapentin (NEURONTIN) 300 MG capsule Take 1 capsule (300 mg total) by mouth 3 (three) times daily.  90 capsule  5  . glimepiride (AMARYL) 1 MG tablet Take 1 mg by mouth daily before breakfast.        . labetalol (NORMODYNE) 200 MG tablet Take  1 tablet (200 mg total) by mouth 4 (four) times daily.      . nortriptyline (PAMELOR) 10 MG capsule Take 1-2 capsules (10-20 mg total) by mouth at bedtime.  60 capsule  11  . omeprazole (PRILOSEC) 40 MG capsule Take 1 capsule (40 mg total) by mouth 2 (two) times daily.  180 capsule  3  . PARoxetine (PAXIL) 20 MG tablet Take 20 mg by mouth daily.        . sodium bicarbonate 650 MG tablet Take 1,300 mg by mouth daily.        . temazepam (RESTORIL) 15 MG capsule Take 1-2 capsules (15-30 mg total) by mouth at bedtime as needed.  60 capsule  4  . triamterene-hydrochlorothiazide (MAXZIDE-25) 37.5-25 MG per tablet TAKE 1 TABLET EVERY MORNING  90 tablet  3   Current Facility-Administered Medications  Medication Dose Route Frequency Provider Last Rate Last Dose  . 0.9 %  sodium chloride infusion  500 mL Intravenous Continuous Iva Boop, MD        REVIEW OF SYSTEMS: Arly.Keller ] denotes positive finding; [  ] denotes negative finding CARDIOVASCULAR:  [ ]  chest  pain   [ ]  chest pressure   [ ]  palpitations   [ ]  orthopnea   [ ]  dyspnea on exertion   [ ]  claudication   [ ]  rest pain   [ ]  DVT   [ ]  phlebitis PULMONARY:   [ ]  productive cough   [ ]  asthma   [ ]  wheezing NEUROLOGIC:   [ ]  weakness  [ ]  paresthesias  [ ]  aphasia  [ ]  amaurosis  [ ]  dizziness HEMATOLOGIC:   [ ]  bleeding problems   [ ]  clotting disorders MUSCULOSKELETAL:  [ ]  joint pain   [ ]  joint swelling [ ]  leg swelling GASTROINTESTINAL: [ ]   blood in stool  [ ]   hematemesis GENITOURINARY:  [ ]   dysuria  [ ]   hematuria PSYCHIATRIC:  [ ]  history of major depression INTEGUMENTARY:  [ ]  rashes  [ ]  ulcers CONSTITUTIONAL:  [ ]  fever   [ ]  chills  PHYSICAL EXAM: Filed Vitals:   09/30/11 1347  BP: 166/95  Pulse: 74  Resp: 16  Height: 5\' 9"  (1.753 m)  Weight: 191 lb (86.637 kg)  SpO2: 98%   Body mass index is 28.21 kg/(m^2). GENERAL: The patient is a well-nourished male, in no acute distress. The vital signs are documented above. CARDIOVASCULAR: There is a regular rate and rhythm without significant murmur appreciated. I do not detect any carotid bruits. He has palpable femoral pulses. Both feet are warm and well-perfused. PULMONARY: There is good air exchange bilaterally without wheezing or rales. ABDOMEN: Soft and non-tender with normal pitched bowel sounds.  MUSCULOSKELETAL: There are no major deformities or cyanosis. NEUROLOGIC: No focal weakness or paresthesias are detected. SKIN: There are no ulcers or rashes noted. PSYCHIATRIC: The patient has a normal affect.  DATA:  I have independently reviewed his CT scan of the abdomen and pelvis. The aneurysm. 2 shrunk down around his graft. I my measurement the aneurysm measures 4 cm in maximum diameter and is thus not changed significantly. Although the ultrasound from a year ago showed 3.6 cm maximum diameter I think this is just a discrepancy between the different studies.  MEDICAL ISSUES: I have ordered a duplex scan of his EVAR  in one year. I will see him back at that time. He knows to call sooner for has problems. Of note  he does continue to smoke and I have discussed with him the importance of tobacco cessation.  Alessander Sikorski S [facility] [place] Office: [phone]

## 2011-10-01 ENCOUNTER — Encounter: Payer: Self-pay | Admitting: Internal Medicine

## 2011-11-30 ENCOUNTER — Other Ambulatory Visit: Payer: Self-pay | Admitting: *Deleted

## 2011-11-30 MED ORDER — GABAPENTIN 300 MG PO CAPS: 300.0000 mg | ORAL_CAPSULE | Freq: Three times a day (TID) | ORAL | Status: DC

## 2011-12-07 ENCOUNTER — Other Ambulatory Visit: Payer: Self-pay

## 2011-12-07 MED ORDER — PAROXETINE HCL 20 MG PO TABS: 20.0000 mg | ORAL_TABLET | Freq: Every day | ORAL | Status: DC

## 2011-12-15 ENCOUNTER — Other Ambulatory Visit (INDEPENDENT_AMBULATORY_CARE_PROVIDER_SITE_OTHER): Payer: Medicare Other

## 2011-12-15 DIAGNOSIS — E119 Type 2 diabetes mellitus without complications: Secondary | ICD-10-CM

## 2011-12-15 DIAGNOSIS — I739 Peripheral vascular disease, unspecified: Secondary | ICD-10-CM

## 2011-12-15 DIAGNOSIS — I2510 Atherosclerotic heart disease of native coronary artery without angina pectoris: Secondary | ICD-10-CM

## 2011-12-15 LAB — BASIC METABOLIC PANEL
BUN: 31 mg/dL — ABNORMAL HIGH (ref 6–23)
Calcium: 8.7 mg/dL (ref 8.4–10.5)
Creatinine, Ser: 2.5 mg/dL — ABNORMAL HIGH (ref 0.4–1.5)
GFR: 26.86 mL/min — ABNORMAL LOW (ref >60.00)

## 2011-12-15 LAB — HEPATIC FUNCTION PANEL
ALT: 22 U/L (ref 0–53)
AST: 19 U/L (ref 0–37)
Albumin: 3.7 g/dL (ref 3.5–5.2)
Alkaline Phosphatase: 89 U/L (ref 39–117)
Bilirubin, Direct: 0 mg/dL (ref 0.0–0.3)
Total Protein: 7.4 g/dL (ref 6.0–8.3)

## 2011-12-15 LAB — LIPID PANEL: Cholesterol: 234 mg/dL — ABNORMAL HIGH (ref 0–200)

## 2011-12-15 LAB — HEMOGLOBIN A1C: Hgb A1c MFr Bld: 6.8 % — ABNORMAL HIGH (ref 4.6–6.5)

## 2011-12-15 LAB — LDL CHOLESTEROL, DIRECT: Direct LDL: 120.7 mg/dL

## 2011-12-18 ENCOUNTER — Telehealth: Payer: Self-pay | Admitting: *Deleted

## 2011-12-18 NOTE — Telephone Encounter (Signed)
Pt states he never filled atorvastatin. He states he will fill Lipitor.

## 2011-12-18 NOTE — Telephone Encounter (Signed)
Rf req for simvastatin 40 mg 1 po qd. # 90. Last filled 02-17-11. Med is not active list. Ok to Rf?

## 2011-12-18 NOTE — Telephone Encounter (Signed)
The pt called back to check on his rx request for Simvastatin 40mg .  He states he needs the med before the weekend.  Thanks!

## 2011-12-19 NOTE — Telephone Encounter (Signed)
Noted. Thx.

## 2011-12-23 ENCOUNTER — Encounter: Payer: Self-pay | Admitting: Internal Medicine

## 2011-12-23 ENCOUNTER — Ambulatory Visit (INDEPENDENT_AMBULATORY_CARE_PROVIDER_SITE_OTHER): Payer: Medicare Other | Admitting: Internal Medicine

## 2011-12-23 VITALS — BP 120/80 | HR 84 | Temp 97.4°F | Resp 16 | Wt 189.0 lb

## 2011-12-23 DIAGNOSIS — E1151 Type 2 diabetes mellitus with diabetic peripheral angiopathy without gangrene: Secondary | ICD-10-CM

## 2011-12-23 DIAGNOSIS — E785 Hyperlipidemia, unspecified: Secondary | ICD-10-CM

## 2011-12-23 DIAGNOSIS — J449 Chronic obstructive pulmonary disease, unspecified: Secondary | ICD-10-CM

## 2011-12-23 DIAGNOSIS — E119 Type 2 diabetes mellitus without complications: Secondary | ICD-10-CM

## 2011-12-23 DIAGNOSIS — D485 Neoplasm of uncertain behavior of skin: Secondary | ICD-10-CM

## 2011-12-23 DIAGNOSIS — E1159 Type 2 diabetes mellitus with other circulatory complications: Secondary | ICD-10-CM

## 2011-12-23 DIAGNOSIS — N259 Disorder resulting from impaired renal tubular function, unspecified: Secondary | ICD-10-CM

## 2011-12-23 DIAGNOSIS — I798 Other disorders of arteries, arterioles and capillaries in diseases classified elsewhere: Secondary | ICD-10-CM

## 2011-12-23 DIAGNOSIS — I739 Peripheral vascular disease, unspecified: Secondary | ICD-10-CM

## 2011-12-23 DIAGNOSIS — K2270 Barrett's esophagus without dysplasia: Secondary | ICD-10-CM

## 2011-12-23 DIAGNOSIS — I2510 Atherosclerotic heart disease of native coronary artery without angina pectoris: Secondary | ICD-10-CM

## 2011-12-23 DIAGNOSIS — J4489 Other specified chronic obstructive pulmonary disease: Secondary | ICD-10-CM

## 2011-12-23 NOTE — Assessment & Plan Note (Signed)
2013: ablation at [facility], needs x2

## 2011-12-23 NOTE — Assessment & Plan Note (Signed)
Continue with current prescription therapy as reflected on the Med list.  

## 2011-12-23 NOTE — Assessment & Plan Note (Signed)
Will sch bx 

## 2011-12-23 NOTE — Progress Notes (Signed)
Patient ID: [name], male   DOB: June 06, 1941, 71 y.o.   MRN: [id number]  Subjective:    Patient ID: [name], male    DOB: Mar 05, 1941, 71 y.o.   MRN: [id number]  HPI  The patient presents for a follow-up of  chronic hypertension, chronic dyslipidemia, type 2 diabetes, Barrett's, CAD controlled with medicines. He had Barrett's ablation. C/o growth on L forearm x 2    Review of Systems  Constitutional: Negative for appetite change, fatigue and unexpected weight change.  HENT: Negative for nosebleeds, congestion, sore throat, sneezing, trouble swallowing and neck pain.   Eyes: Negative for itching and visual disturbance.  Respiratory: Negative for cough.   Cardiovascular: Negative for chest pain, palpitations and leg swelling.  Gastrointestinal: Negative for nausea, diarrhea, blood in stool and abdominal distention.  Genitourinary: Negative for frequency and hematuria.  Musculoskeletal: Negative for back pain, joint swelling and gait problem.  Skin: Negative for rash.  Neurological: Negative for dizziness, tremors, speech difficulty and weakness.  Psychiatric/Behavioral: Negative for sleep disturbance, dysphoric mood and agitation. The patient is not nervous/anxious.    Wt Readings from Last 3 Encounters:  12/23/11 189 lb (85.73 kg)  09/30/11 191 lb (86.637 kg)  09/23/11 192 lb (87.091 kg)        Objective:   Physical Exam  Constitutional: He is oriented to person, place, and time. He appears well-developed.  HENT:  Mouth/Throat: Oropharynx is clear and moist.  Eyes: Conjunctivae are normal. Pupils are equal, round, and reactive to light.  Neck: Normal range of motion. No JVD present. No thyromegaly present.  Cardiovascular: Normal rate, regular rhythm, normal heart sounds and intact distal pulses.  Exam reveals no gallop and no friction rub.   No murmur heard. Pulmonary/Chest: Effort normal and breath sounds normal. No respiratory distress. He has no wheezes. He has no  rales. He exhibits no tenderness.  Abdominal: Soft. Bowel sounds are normal. He exhibits no distension and no mass. There is no tenderness. There is no rebound and no guarding.  Musculoskeletal: Normal range of motion. He exhibits no edema and no tenderness.  Lymphadenopathy:    He has no cervical adenopathy.  Neurological: He is alert and oriented to person, place, and time. He has normal reflexes. No cranial nerve deficit. He exhibits normal muscle tone. Coordination normal.  Skin: Skin is warm and dry. No rash noted.  Psychiatric: He has a normal mood and affect. His behavior is normal. Judgment and thought content normal.  Two lesions on L forearm 11 mm Lab Results  Component Value Date   WBC 10.2 12/05/2010   HGB 10.5* 12/05/2010   HCT 32.3* 12/05/2010   PLT 186 12/05/2010   GLUCOSE 109* 12/15/2011   CHOL 234* 12/15/2011   TRIG 374.0* 12/15/2011   HDL 41.10 12/15/2011   LDLDIRECT 120.7 12/15/2011   LDLCALC 90 02/10/2011   ALT 22 12/15/2011   AST 19 12/15/2011   NA 141 12/15/2011   K 5.3* 12/15/2011   CL 109 12/15/2011   CREATININE 2.5* 12/15/2011   BUN 31* 12/15/2011   CO2 22 12/15/2011   TSH 1.23 04/17/2010   PSA 0.99 04/17/2010   INR 1.0 01/21/2009   HGBA1C 6.8* 12/15/2011          Assessment & Plan:

## 2012-01-01 ENCOUNTER — Other Ambulatory Visit: Payer: Self-pay | Admitting: Dermatology

## 2012-01-15 ENCOUNTER — Other Ambulatory Visit: Payer: Self-pay | Admitting: Dermatology

## 2012-01-18 ENCOUNTER — Telehealth: Payer: Self-pay | Admitting: *Deleted

## 2012-01-18 NOTE — Telephone Encounter (Signed)
Rf req for Temazepam 15 mg 1-2 po qhs prn insomnia. Ok to Rf?

## 2012-01-19 MED ORDER — TEMAZEPAM 15 MG PO CAPS: 15.0000 mg | ORAL_CAPSULE | Freq: Every evening | ORAL | Status: DC | PRN

## 2012-01-19 NOTE — Telephone Encounter (Signed)
done

## 2012-01-19 NOTE — Telephone Encounter (Signed)
OK to fill this prescription with additional refills x5 Thank you!  

## 2012-01-21 ENCOUNTER — Other Ambulatory Visit: Payer: Self-pay | Admitting: Internal Medicine

## 2012-01-21 ENCOUNTER — Encounter: Payer: Self-pay | Admitting: Internal Medicine

## 2012-01-25 ENCOUNTER — Other Ambulatory Visit: Payer: Self-pay | Admitting: *Deleted

## 2012-01-25 MED ORDER — GABAPENTIN 300 MG PO CAPS: 300.0000 mg | ORAL_CAPSULE | Freq: Three times a day (TID) | ORAL | Status: DC

## 2012-01-25 MED ORDER — OMEPRAZOLE 40 MG PO CPDR: 40.0000 mg | DELAYED_RELEASE_CAPSULE | Freq: Two times a day (BID) | ORAL | Status: DC

## 2012-01-25 MED ORDER — BUSPIRONE HCL 15 MG PO TABS: 7.5000 mg | ORAL_TABLET | Freq: Two times a day (BID) | ORAL | Status: DC

## 2012-01-25 MED ORDER — NORTRIPTYLINE HCL 10 MG PO CAPS: 10.0000 mg | ORAL_CAPSULE | Freq: Every day | ORAL | Status: DC

## 2012-01-25 NOTE — Progress Notes (Signed)
Addended by: Merrilyn Puma on: 01/25/2012 11:09 AM   Modules accepted: Orders, Medications

## 2012-02-09 ENCOUNTER — Telehealth: Payer: Self-pay

## 2012-02-09 NOTE — Telephone Encounter (Signed)
Patient is also requesting Remus Loffler, please advise if ok and directions and quanity

## 2012-02-09 NOTE — Telephone Encounter (Signed)
OK to fill this prescription with additional refills x5 Thank you!  

## 2012-02-09 NOTE — Telephone Encounter (Signed)
Received faxed refill request for alprazolam 0.5mg  bid prn. Please advise if ok to refill, last filled 09/23/11 #60/3

## 2012-02-10 ENCOUNTER — Telehealth: Payer: Self-pay | Admitting: *Deleted

## 2012-02-10 ENCOUNTER — Other Ambulatory Visit: Payer: Self-pay | Admitting: *Deleted

## 2012-02-10 MED ORDER — ZOLPIDEM TARTRATE 10 MG PO TABS: 10.0000 mg | ORAL_TABLET | Freq: Every evening | ORAL | Status: DC | PRN

## 2012-02-10 MED ORDER — ALPRAZOLAM 0.5 MG PO TABS: 0.5000 mg | ORAL_TABLET | Freq: Two times a day (BID) | ORAL | Status: DC | PRN

## 2012-02-10 NOTE — Telephone Encounter (Signed)
Ok - use very little Thx

## 2012-02-10 NOTE — Telephone Encounter (Signed)
Pt states temazepam is no longer covered. He is requesting new Rx for zolpidem to replace this. Please advise

## 2012-02-10 NOTE — Telephone Encounter (Signed)
addressed

## 2012-02-10 NOTE — Telephone Encounter (Signed)
Addended by: Janeal Holmes on: 02/10/2012 03:25 PM   Modules accepted: Orders

## 2012-02-22 ENCOUNTER — Telehealth: Payer: Self-pay | Admitting: *Deleted

## 2012-02-22 MED ORDER — TEMAZEPAM 15 MG PO CAPS: 15.0000 mg | ORAL_CAPSULE | Freq: Every evening | ORAL | Status: DC | PRN

## 2012-02-22 NOTE — Telephone Encounter (Signed)
Rec call from pharmacy stating pt doesn't like Ambien. He is requesting to fill Temazepam now and is willing to pat OOP for this since it isn't much. Please advise.

## 2012-02-22 NOTE — Telephone Encounter (Signed)
Ok - bring Zolpidem pills to the office to discard and pick up Temazepam rx Thx

## 2012-02-22 NOTE — Telephone Encounter (Signed)
Pt informed. He states he will bring Ambien back in a day or two. Temazepam Rx upfront.

## 2012-02-25 ENCOUNTER — Telehealth: Payer: Self-pay | Admitting: *Deleted

## 2012-02-25 NOTE — Telephone Encounter (Signed)
Pt left vm stating he is soon going out of town. He will keep taking what Ambien he has and when he returns to town. At that time, he will come and pick up written Rx for Temazepam.

## 2012-02-25 NOTE — Telephone Encounter (Signed)
Ok Thx 

## 2012-03-01 ENCOUNTER — Other Ambulatory Visit: Payer: Self-pay | Admitting: Internal Medicine

## 2012-03-03 ENCOUNTER — Telehealth: Payer: Self-pay | Admitting: Internal Medicine

## 2012-03-03 NOTE — Telephone Encounter (Signed)
Per pharmacy, Rx written 01/25/2012 was placed on hold. Prescription will be filled for pt today, pt informed of same.

## 2012-03-03 NOTE — Telephone Encounter (Signed)
The pt called and is hoping to get a refill of Buspar 15mg  sent to the [facility] on [address].

## 2012-03-29 ENCOUNTER — Other Ambulatory Visit (INDEPENDENT_AMBULATORY_CARE_PROVIDER_SITE_OTHER): Payer: Medicare Other

## 2012-03-29 DIAGNOSIS — J449 Chronic obstructive pulmonary disease, unspecified: Secondary | ICD-10-CM

## 2012-03-29 DIAGNOSIS — I2510 Atherosclerotic heart disease of native coronary artery without angina pectoris: Secondary | ICD-10-CM

## 2012-03-29 DIAGNOSIS — E1151 Type 2 diabetes mellitus with diabetic peripheral angiopathy without gangrene: Secondary | ICD-10-CM

## 2012-03-29 DIAGNOSIS — E1159 Type 2 diabetes mellitus with other circulatory complications: Secondary | ICD-10-CM

## 2012-03-29 DIAGNOSIS — E119 Type 2 diabetes mellitus without complications: Secondary | ICD-10-CM

## 2012-03-29 DIAGNOSIS — N259 Disorder resulting from impaired renal tubular function, unspecified: Secondary | ICD-10-CM

## 2012-03-29 DIAGNOSIS — E785 Hyperlipidemia, unspecified: Secondary | ICD-10-CM

## 2012-03-29 DIAGNOSIS — K2270 Barrett's esophagus without dysplasia: Secondary | ICD-10-CM

## 2012-03-29 DIAGNOSIS — I739 Peripheral vascular disease, unspecified: Secondary | ICD-10-CM

## 2012-03-29 DIAGNOSIS — I798 Other disorders of arteries, arterioles and capillaries in diseases classified elsewhere: Secondary | ICD-10-CM

## 2012-03-29 DIAGNOSIS — D485 Neoplasm of uncertain behavior of skin: Secondary | ICD-10-CM

## 2012-03-29 LAB — BASIC METABOLIC PANEL
CO2: 24 mEq/L (ref 19–32)
Chloride: 108 mEq/L (ref 96–112)
Creatinine, Ser: 2.4 mg/dL — ABNORMAL HIGH (ref 0.4–1.5)
Potassium: 4.4 mEq/L (ref 3.5–5.1)

## 2012-03-29 LAB — LIPID PANEL
Cholesterol: 175 mg/dL (ref 0–200)
HDL: 36.9 mg/dL — ABNORMAL LOW (ref >39.00)
Total CHOL/HDL Ratio: 5
Triglycerides: 297 mg/dL — ABNORMAL HIGH (ref 0.0–149.0)
VLDL: 59.4 mg/dL — ABNORMAL HIGH (ref 0.0–40.0)

## 2012-03-29 LAB — HEMOGLOBIN A1C: Hgb A1c MFr Bld: 6.5 % (ref 4.6–6.5)

## 2012-04-07 ENCOUNTER — Encounter: Payer: Self-pay | Admitting: Internal Medicine

## 2012-04-07 ENCOUNTER — Ambulatory Visit (INDEPENDENT_AMBULATORY_CARE_PROVIDER_SITE_OTHER): Payer: Medicare Other | Admitting: Internal Medicine

## 2012-04-07 VITALS — BP 120/80 | HR 76 | Temp 98.5°F | Resp 16 | Wt 185.0 lb

## 2012-04-07 DIAGNOSIS — E1151 Type 2 diabetes mellitus with diabetic peripheral angiopathy without gangrene: Secondary | ICD-10-CM

## 2012-04-07 DIAGNOSIS — K2270 Barrett's esophagus without dysplasia: Secondary | ICD-10-CM

## 2012-04-07 DIAGNOSIS — N259 Disorder resulting from impaired renal tubular function, unspecified: Secondary | ICD-10-CM

## 2012-04-07 DIAGNOSIS — E1159 Type 2 diabetes mellitus with other circulatory complications: Secondary | ICD-10-CM

## 2012-04-07 DIAGNOSIS — I129 Hypertensive chronic kidney disease with stage 1 through stage 4 chronic kidney disease, or unspecified chronic kidney disease: Secondary | ICD-10-CM

## 2012-04-07 DIAGNOSIS — E119 Type 2 diabetes mellitus without complications: Secondary | ICD-10-CM

## 2012-04-07 DIAGNOSIS — G4700 Insomnia, unspecified: Secondary | ICD-10-CM

## 2012-04-07 DIAGNOSIS — F17200 Nicotine dependence, unspecified, uncomplicated: Secondary | ICD-10-CM

## 2012-04-07 DIAGNOSIS — I798 Other disorders of arteries, arterioles and capillaries in diseases classified elsewhere: Secondary | ICD-10-CM

## 2012-04-07 DIAGNOSIS — J449 Chronic obstructive pulmonary disease, unspecified: Secondary | ICD-10-CM

## 2012-04-07 DIAGNOSIS — N189 Chronic kidney disease, unspecified: Secondary | ICD-10-CM

## 2012-04-07 NOTE — Assessment & Plan Note (Signed)
Continue with current prescription therapy as reflected on the Med list.  

## 2012-04-07 NOTE — Assessment & Plan Note (Signed)
He cont to smoke Continue with current prescription therapy as reflected on the Med list.

## 2012-04-07 NOTE — Assessment & Plan Note (Signed)
Discussed.

## 2012-04-07 NOTE — Assessment & Plan Note (Signed)
Continue with current prescription therapy as reflected on the Med list. Monitoring labs

## 2012-04-07 NOTE — Assessment & Plan Note (Signed)
Followed by Leone Payor. 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  --- the possibility of radiofrequency ablation of his Barrett's esophagus - Consult Dr. Margaretha Glassing [place] [facility] - 09/17/11 - to proceed with ablation via RFA if EGD/EUS do not show malignancy Last ablation is pending

## 2012-04-07 NOTE — Progress Notes (Signed)
  Subjective:    Patient ID: [name], male    DOB: Sep 18, 1941, 71 y.o.   MRN: [id number]  HPI  The patient presents for a follow-up of  chronic hypertension, chronic dyslipidemia, type 2 diabetes, Barrett's, CAD controlled with medicines. He had Barrett's ablation.     Review of Systems  Constitutional: Negative for appetite change, fatigue and unexpected weight change.  HENT: Negative for nosebleeds, congestion, sore throat, sneezing, trouble swallowing and neck pain.   Eyes: Negative for itching and visual disturbance.  Respiratory: Negative for cough.   Cardiovascular: Negative for chest pain, palpitations and leg swelling.  Gastrointestinal: Negative for nausea, diarrhea, blood in stool and abdominal distention.  Genitourinary: Negative for frequency and hematuria.  Musculoskeletal: Negative for back pain, joint swelling and gait problem.  Skin: Negative for rash.  Neurological: Negative for dizziness, tremors, speech difficulty and weakness.  Psychiatric/Behavioral: Negative for sleep disturbance, dysphoric mood and agitation. The patient is not nervous/anxious.    Wt Readings from Last 3 Encounters:  04/07/12 185 lb (83.915 kg)  12/23/11 189 lb (85.73 kg)  09/30/11 191 lb (86.637 kg)   BP Readings from Last 3 Encounters:  04/07/12 120/80  12/23/11 120/80  09/30/11 166/95        Objective:   Physical Exam  Constitutional: He is oriented to person, place, and time. He appears well-developed.  HENT:  Mouth/Throat: Oropharynx is clear and moist.  Eyes: Conjunctivae are normal. Pupils are equal, round, and reactive to light.  Neck: Normal range of motion. No JVD present. No thyromegaly present.  Cardiovascular: Normal rate, regular rhythm, normal heart sounds and intact distal pulses.  Exam reveals no gallop and no friction rub.   No murmur heard. Pulmonary/Chest: Effort normal and breath sounds normal. No respiratory distress. He has no wheezes. He has no rales. He  exhibits no tenderness.  Abdominal: Soft. Bowel sounds are normal. He exhibits no distension and no mass. There is no tenderness. There is no rebound and no guarding.  Musculoskeletal: Normal range of motion. He exhibits no edema and no tenderness.  Lymphadenopathy:    He has no cervical adenopathy.  Neurological: He is alert and oriented to person, place, and time. He has normal reflexes. No cranial nerve deficit. He exhibits normal muscle tone. Coordination normal.  Skin: Skin is warm and dry. No rash noted.  Psychiatric: He has a normal mood and affect. His behavior is normal. Judgment and thought content normal.   Lab Results  Component Value Date   WBC 10.2 12/05/2010   HGB 10.5* 12/05/2010   HCT 32.3* 12/05/2010   PLT 186 12/05/2010   GLUCOSE 149* 03/29/2012   CHOL 175 03/29/2012   TRIG 297.0* 03/29/2012   HDL 36.90* 03/29/2012   LDLDIRECT 95.6 03/29/2012   LDLCALC 90 02/10/2011   ALT 22 12/15/2011   AST 19 12/15/2011   NA 141 03/29/2012   K 4.4 03/29/2012   CL 108 03/29/2012   CREATININE 2.4* 03/29/2012   BUN 33* 03/29/2012   CO2 24 03/29/2012   TSH 1.23 04/17/2010   PSA 0.99 04/17/2010   INR 1.0 01/21/2009   HGBA1C 6.5 03/29/2012          Assessment & Plan:

## 2012-04-11 ENCOUNTER — Telehealth: Payer: Self-pay | Admitting: Internal Medicine

## 2012-04-11 NOTE — Telephone Encounter (Signed)
Caller: Cas/Patient; PCP: Sonda Primes; CB#: [phone];  Call regarding Left Elbow Injury; Injured L elbow in fall in home while using pain pills approx 2-3 wks ago.  Reports ongoing swelling of elbow and painful when touches the bone.  Advised to see MD within 24 hrs for swelling that persists 72 hrs following injury even with home care per Elbow Injury. Appt scheduled for 04/12/12 at 0945 with Dr [name].

## 2012-04-11 NOTE — Telephone Encounter (Signed)
Noted. Thx.

## 2012-04-12 ENCOUNTER — Ambulatory Visit (INDEPENDENT_AMBULATORY_CARE_PROVIDER_SITE_OTHER): Payer: Medicare Other | Admitting: Internal Medicine

## 2012-04-12 ENCOUNTER — Ambulatory Visit (INDEPENDENT_AMBULATORY_CARE_PROVIDER_SITE_OTHER)
Admission: RE | Admit: 2012-04-12 | Discharge: 2012-04-12 | Disposition: A | Discharge status: Home / Self Care | Payer: Medicare Other | Source: Ambulatory Visit | Attending: Internal Medicine | Admitting: Internal Medicine

## 2012-04-12 ENCOUNTER — Telehealth: Payer: Self-pay | Admitting: Internal Medicine

## 2012-04-12 ENCOUNTER — Encounter: Payer: Self-pay | Admitting: Internal Medicine

## 2012-04-12 VITALS — BP 148/94 | HR 80 | Temp 97.4°F | Resp 16 | Wt 185.0 lb

## 2012-04-12 DIAGNOSIS — M25529 Pain in unspecified elbow: Secondary | ICD-10-CM | POA: Insufficient documentation

## 2012-04-12 DIAGNOSIS — M7989 Other specified soft tissue disorders: Secondary | ICD-10-CM

## 2012-04-12 NOTE — Progress Notes (Signed)
Patient ID: [name], male   DOB: [birth year], 71 y.o.   MRN: [id number]  Subjective:    Patient ID: [name], male    DOB: [birth year], 71 y.o.   MRN: [id number]  HPI C/o L elbow pain and swelling after a fall 2.5 wks ago.  The patient presents for a follow-up of  chronic hypertension, chronic dyslipidemia, type 2 diabetes, Barrett's, CAD controlled with medicines. He had Barrett's ablation.     Review of Systems  Constitutional: Negative for appetite change, fatigue and unexpected weight change.  HENT: Negative for nosebleeds, congestion, sore throat, sneezing, trouble swallowing and neck pain.   Eyes: Negative for itching and visual disturbance.  Respiratory: Negative for cough.   Cardiovascular: Negative for chest pain, palpitations and leg swelling.  Gastrointestinal: Negative for nausea, diarrhea, blood in stool and abdominal distention.  Genitourinary: Negative for frequency and hematuria.  Musculoskeletal: Negative for back pain, joint swelling and gait problem.  Skin: Negative for rash.  Neurological: Negative for dizziness, tremors, speech difficulty and weakness.  Psychiatric/Behavioral: Negative for sleep disturbance, dysphoric mood and agitation. The patient is not nervous/anxious.    Wt Readings from Last 3 Encounters:  04/12/12 185 lb (83.915 kg)  04/07/12 185 lb (83.915 kg)  12/23/11 189 lb (85.73 kg)   BP Readings from Last 3 Encounters:  04/12/12 148/94  04/07/12 120/80  12/23/11 120/80        Objective:   Physical Exam  Constitutional: He is oriented to person, place, and time. He appears well-developed.  HENT:  Mouth/Throat: Oropharynx is clear and moist.  Eyes: Conjunctivae are normal. Pupils are equal, round, and reactive to light.  Neck: Normal range of motion. No JVD present. No thyromegaly present.  Cardiovascular: Normal rate, regular rhythm, normal heart sounds and intact distal pulses.  Exam reveals no gallop and no friction rub.   No  murmur heard. Pulmonary/Chest: Effort normal and breath sounds normal. No respiratory distress. He has no wheezes. He has no rales. He exhibits no tenderness.  Abdominal: Soft. Bowel sounds are normal. He exhibits no distension and no mass. There is no tenderness. There is no rebound and no guarding.  Musculoskeletal: Normal range of motion. He exhibits no edema and no tenderness.  Lymphadenopathy:    He has no cervical adenopathy.  Neurological: He is alert and oriented to person, place, and time. He has normal reflexes. No cranial nerve deficit. He exhibits normal muscle tone. Coordination normal.  Skin: Skin is warm and dry. No rash noted.  Psychiatric: He has a normal mood and affect. His behavior is normal. Judgment and thought content normal.   L radial elbow is tender and swollen  Lab Results  Component Value Date   WBC 10.2 12/05/2010   HGB 10.5* 12/05/2010   HCT 32.3* 12/05/2010   PLT 186 12/05/2010   GLUCOSE 149* 03/29/2012   CHOL 175 03/29/2012   TRIG 297.0* 03/29/2012   HDL 36.90* 03/29/2012   LDLDIRECT 95.6 03/29/2012   LDLCALC 90 02/10/2011   ALT 22 12/15/2011   AST 19 12/15/2011   NA 141 03/29/2012   K 4.4 03/29/2012   CL 108 03/29/2012   CREATININE 2.4* 03/29/2012   BUN 33* 03/29/2012   CO2 24 03/29/2012   TSH 1.23 04/17/2010   PSA 0.99 04/17/2010   INR 1.0 01/21/2009   HGBA1C 6.5 03/29/2012   Procedure Note :    Procedure :   Point of care (POC) sonography examination   Indication: L rad elbow  pain   Equipment used: Sonosite M-Turbo with HFL38x/13-6 MHz transducer linear probe. The images were stored in the unit and later transferred in storage.  The patient was placed in a sitting position.  This study revealed a soft tissue swelling in thr area of the L lateral epicondyl   Impression: a soft tissue swelling in thr area of the L lateral epicondyl was noted          Assessment & Plan:

## 2012-04-12 NOTE — Telephone Encounter (Signed)
Pt informed

## 2012-04-12 NOTE — Patient Instructions (Signed)
Pennsaid 1 drop 3 times a day

## 2012-04-12 NOTE — Telephone Encounter (Signed)
Stacey, please, inform elbow x ray w/o fx. Rx as we discussed Thx

## 2012-04-12 NOTE — Assessment & Plan Note (Signed)
Left. S/p fall 5/13 Xray

## 2012-04-15 ENCOUNTER — Encounter: Payer: Self-pay | Admitting: Internal Medicine

## 2012-05-31 ENCOUNTER — Other Ambulatory Visit: Payer: Self-pay | Admitting: Internal Medicine

## 2012-06-01 ENCOUNTER — Other Ambulatory Visit: Payer: Self-pay | Admitting: *Deleted

## 2012-06-01 MED ORDER — GLIMEPIRIDE 1 MG PO TABS: 1.0000 mg | ORAL_TABLET | Freq: Every day | ORAL | Status: DC

## 2012-06-21 ENCOUNTER — Telehealth: Payer: Self-pay | Admitting: Internal Medicine

## 2012-06-21 ENCOUNTER — Ambulatory Visit (INDEPENDENT_AMBULATORY_CARE_PROVIDER_SITE_OTHER): Payer: Medicare Other | Admitting: Internal Medicine

## 2012-06-21 ENCOUNTER — Encounter: Payer: Self-pay | Admitting: Internal Medicine

## 2012-06-21 VITALS — BP 192/112 | HR 80 | Temp 97.8°F | Resp 16 | Wt 192.4 lb

## 2012-06-21 DIAGNOSIS — I2510 Atherosclerotic heart disease of native coronary artery without angina pectoris: Secondary | ICD-10-CM

## 2012-06-21 DIAGNOSIS — E1159 Type 2 diabetes mellitus with other circulatory complications: Secondary | ICD-10-CM

## 2012-06-21 DIAGNOSIS — K2270 Barrett's esophagus without dysplasia: Secondary | ICD-10-CM

## 2012-06-21 DIAGNOSIS — I10 Essential (primary) hypertension: Secondary | ICD-10-CM

## 2012-06-21 DIAGNOSIS — E785 Hyperlipidemia, unspecified: Secondary | ICD-10-CM

## 2012-06-21 DIAGNOSIS — Z23 Encounter for immunization: Secondary | ICD-10-CM

## 2012-06-21 MED ORDER — CLONIDINE HCL 0.1 MG PO TABS: 0.1000 mg | ORAL_TABLET | Freq: Two times a day (BID) | ORAL | Status: DC

## 2012-06-21 MED ORDER — LABETALOL HCL 300 MG PO TABS: 300.0000 mg | ORAL_TABLET | Freq: Two times a day (BID) | ORAL | Status: DC

## 2012-06-21 NOTE — Patient Instructions (Signed)
Add Clonidine to your BP meds if BP remains too high

## 2012-06-21 NOTE — Telephone Encounter (Signed)
Caller: [name]/Patient; Patient Name: [name]; PCP: Sonda Primes; Best Callback Phone Number: [phone].  Patient calling today 06/21/12 regarding went for endoscopy today 06/21/12 at [facility].  Said his blood pressure was elevated.  They had to give him blood pressure in his IV, his blood pressure was 202/105.  They got his blood pressure down to 192/100, and let him go home and told him to follow up with his MD.  Pt takes Labetalol 200 mg QID, also takes Triamterene/HCTZ 37.5/25 mg, takes one tablet every morning.  Emergent symptoms ruled out by Hypertension, Diagnosed or Suspected guidelines with exception of systolic blood pressure of more than 180 mmHg or diastolic blood pressure of more than 120 mmHg.  Care advice given.  Appointment scheduled for today at 4:30 PM with Dr. [name].  Wife will drive him to office (patient not supposed to drive after his procedure).

## 2012-06-21 NOTE — Progress Notes (Signed)
   Subjective:    Patient ID: [name], male    DOB: 13-Feb-1941, 71 y.o.   MRN: [id number]  HPI C/o SBP 205 this am at  [facility] The patient presents for a follow-up of  chronic hypertension, chronic dyslipidemia, type 2 diabetes, Barrett's, CAD controlled with medicines. He had Barrett's ablation.     Review of Systems  Constitutional: Negative for appetite change, fatigue and unexpected weight change.  HENT: Negative for nosebleeds, congestion, sore throat, sneezing, trouble swallowing and neck pain.   Eyes: Negative for itching and visual disturbance.  Respiratory: Negative for cough.   Cardiovascular: Negative for chest pain, palpitations and leg swelling.  Gastrointestinal: Negative for nausea, diarrhea, blood in stool and abdominal distention.  Genitourinary: Negative for frequency and hematuria.  Musculoskeletal: Negative for back pain, joint swelling and gait problem.  Skin: Negative for rash.  Neurological: Negative for dizziness, tremors, speech difficulty and weakness.  Psychiatric/Behavioral: Negative for disturbed wake/sleep cycle, dysphoric mood and agitation. The patient is not nervous/anxious.    Wt Readings from Last 3 Encounters:  06/21/12 192 lb 6 oz (87.261 kg)  04/12/12 185 lb (83.915 kg)  04/07/12 185 lb (83.915 kg)   BP Readings from Last 3 Encounters:  06/21/12 192/112  04/12/12 148/94  04/07/12 120/80        Objective:   Physical Exam  Constitutional: He is oriented to person, place, and time. He appears well-developed.  HENT:  Mouth/Throat: Oropharynx is clear and moist.  Eyes: Conjunctivae are normal. Pupils are equal, round, and reactive to light.  Neck: Normal range of motion. No JVD present. No thyromegaly present.  Cardiovascular: Normal rate, regular rhythm, normal heart sounds and intact distal pulses.  Exam reveals no gallop and no friction rub.   No murmur heard. Pulmonary/Chest: Effort normal and breath sounds normal. No respiratory  distress. He has no wheezes. He has no rales. He exhibits no tenderness.  Abdominal: Soft. Bowel sounds are normal. He exhibits no distension and no mass. There is no tenderness. There is no rebound and no guarding.  Musculoskeletal: Normal range of motion. He exhibits no edema and no tenderness.  Lymphadenopathy:    He has no cervical adenopathy.  Neurological: He is alert and oriented to person, place, and time. He has normal reflexes. No cranial nerve deficit. He exhibits normal muscle tone. Coordination normal.  Skin: Skin is warm and dry. No rash noted.  Psychiatric: He has a normal mood and affect. His behavior is normal. Judgment and thought content normal.   Lab Results  Component Value Date   WBC 10.2 12/05/2010   HGB 10.5* 12/05/2010   HCT 32.3* 12/05/2010   PLT 186 12/05/2010   GLUCOSE 149* 03/29/2012   CHOL 175 03/29/2012   TRIG 297.0* 03/29/2012   HDL 36.90* 03/29/2012   LDLDIRECT 95.6 03/29/2012   LDLCALC 90 02/10/2011   ALT 22 12/15/2011   AST 19 12/15/2011   NA 141 03/29/2012   K 4.4 03/29/2012   CL 108 03/29/2012   CREATININE 2.4* 03/29/2012   BUN 33* 03/29/2012   CO2 24 03/29/2012   TSH 1.23 04/17/2010   PSA 0.99 04/17/2010   INR 1.0 01/21/2009   HGBA1C 6.5 03/29/2012          Assessment & Plan:

## 2012-06-23 ENCOUNTER — Telehealth: Payer: Self-pay | Admitting: Internal Medicine

## 2012-06-23 NOTE — Telephone Encounter (Signed)
Caller: [name]/Patient; Patient Name: [name]; PCP: Sonda Primes; Best Callback Phone Number: [phone].  Call regarding low Blood Pressure 99/67 on 8-22.  Patient started Clonidine 0.1mg  twice daily and Labetalol 300mg  twice daily on 8-20.  Patient's normal sytolic is 150 to 140. All emergent symptoms ruled out per Hypertension Protocol, call provider within 8 hours due to drop in BP .  PLEASE CALL PT REGARDING DROP IN BP AND ON NEW MEDS.

## 2012-06-23 NOTE — Telephone Encounter (Signed)
Stop scheduled clonidine. Take the clonidine as needed twice a day for SBP 160+

## 2012-06-24 ENCOUNTER — Telehealth: Payer: Self-pay | Admitting: Internal Medicine

## 2012-06-24 NOTE — Telephone Encounter (Signed)
Caller: [name]/Patient; Patient Name: [name], Johnny; PCP: Plotnikov, Alex; Best Callback Phone Number: [phone]; pt is calling and states that he doesn't know what is high or low for a blood pressure reading; he is worried what is too low of a blood pressure;  explained to pt that a "normal" blood pressure is 120/80; BP 114/67 at present and wants to know if this is okay; explained that this is a good number; pt denies any symptoms; offered triaged but pt declined at this time; explained if top number goes below 90 and bottom number goes below 60 call back or if develop any symptoms; will comply °  ° °

## 2012-06-24 NOTE — Telephone Encounter (Signed)
Caller: [name]/Patient; Patient Name: [name]; PCP: Sonda Primes; Best Callback Phone Number: [phone].  Patient is calling and following up on Blood pressure medication.  Dr. [name] changed his blood pressure medication Tuesday afternoon 06/21/12.  He states his pressure has went from 205/013 to 99/66.  Patient started on Clonidine 0.1mg  twice a daily, along with Labetalol 300mg  qid.  He called yesterday since his pressure had dropped and questioned  should he take the medication again today.  He has not taken Clonidine today.  Reviewed Epic and noted that Dr. Debby Bud responded- Stop scheduled clonidine. Take the clonidine as needed twice a day for SBP 160+.  Information shared with patient . Understanding expressed.  Emergent s/sx ruled out per Hypertension, Diagnosed or suspected with exception "sudden drop in Blood pressure (48mmg/Hg or more than usual reading) AND recent change in prescription , nonprescription or alternative medications or their dosages".  Call Provider in 8 hours.  Patient is awake , oriented and not experiencing any dizziness or complications.  Will follow instructions and call back for questions, changes or concerns.

## 2012-06-24 NOTE — Telephone Encounter (Signed)
Caller: [name]/Patient; Patient Name: [name]; PCP: Sonda Primes; Best Callback Phone Number: [phone]; pt is calling and states that he doesn't know what is high or low for a blood pressure reading; he is worried what is too low of a blood pressure;  explained to pt that a "normal" blood pressure is 120/80; BP 114/67 at present and wants to know if this is okay; explained that this is a good number; pt denies any symptoms; offered triaged but pt declined at this time; explained if top number goes below 90 and bottom number goes below 60 call back or if develop any symptoms; will comply

## 2012-07-06 ENCOUNTER — Telehealth: Payer: Self-pay | Admitting: *Deleted

## 2012-07-06 ENCOUNTER — Encounter: Payer: Self-pay | Admitting: Internal Medicine

## 2012-07-06 ENCOUNTER — Ambulatory Visit (INDEPENDENT_AMBULATORY_CARE_PROVIDER_SITE_OTHER): Payer: Medicare Other | Admitting: Internal Medicine

## 2012-07-06 VITALS — BP 112/74 | HR 64 | Temp 97.6°F | Resp 16 | Wt 190.0 lb

## 2012-07-06 DIAGNOSIS — E785 Hyperlipidemia, unspecified: Secondary | ICD-10-CM

## 2012-07-06 DIAGNOSIS — F329 Major depressive disorder, single episode, unspecified: Secondary | ICD-10-CM

## 2012-07-06 DIAGNOSIS — E1159 Type 2 diabetes mellitus with other circulatory complications: Secondary | ICD-10-CM

## 2012-07-06 DIAGNOSIS — F411 Generalized anxiety disorder: Secondary | ICD-10-CM

## 2012-07-06 DIAGNOSIS — G4700 Insomnia, unspecified: Secondary | ICD-10-CM

## 2012-07-06 DIAGNOSIS — I2510 Atherosclerotic heart disease of native coronary artery without angina pectoris: Secondary | ICD-10-CM

## 2012-07-06 DIAGNOSIS — E1151 Type 2 diabetes mellitus with diabetic peripheral angiopathy without gangrene: Secondary | ICD-10-CM

## 2012-07-06 DIAGNOSIS — N259 Disorder resulting from impaired renal tubular function, unspecified: Secondary | ICD-10-CM

## 2012-07-06 DIAGNOSIS — I798 Other disorders of arteries, arterioles and capillaries in diseases classified elsewhere: Secondary | ICD-10-CM

## 2012-07-06 MED ORDER — TEMAZEPAM 15 MG PO CAPS: 15.0000 mg | ORAL_CAPSULE | Freq: Every evening | ORAL | Status: DC | PRN

## 2012-07-06 NOTE — Progress Notes (Signed)
   Subjective:    Patient ID: [name], male    DOB: 07-Dec-1940, 71 y.o.   MRN: [id number]  HPI F/u on elev BP: SBP 205 2 wks ago...  The patient presents for a follow-up of  chronic hypertension, chronic dyslipidemia, type 2 diabetes, Barrett's, CAD controlled with medicines. He had Barrett's ablation x3, the 4th one is due    Review of Systems  Constitutional: Negative for appetite change, fatigue and unexpected weight change.  HENT: Negative for nosebleeds, congestion, sore throat, sneezing, trouble swallowing and neck pain.   Eyes: Negative for itching and visual disturbance.  Respiratory: Negative for cough.   Cardiovascular: Negative for chest pain, palpitations and leg swelling.  Gastrointestinal: Negative for nausea, diarrhea, blood in stool and abdominal distention.  Genitourinary: Negative for frequency and hematuria.  Musculoskeletal: Negative for back pain, joint swelling and gait problem.  Skin: Negative for rash.  Neurological: Negative for dizziness, tremors, speech difficulty and weakness.  Psychiatric/Behavioral: Negative for disturbed wake/sleep cycle, dysphoric mood and agitation. The patient is not nervous/anxious.    Wt Readings from Last 3 Encounters:  07/06/12 190 lb (86.183 kg)  06/21/12 192 lb 6 oz (87.261 kg)  04/12/12 185 lb (83.915 kg)   BP Readings from Last 3 Encounters:  07/06/12 112/74  06/21/12 192/112  04/12/12 148/94        Objective:   Physical Exam  Constitutional: He is oriented to person, place, and time. He appears well-developed.  HENT:  Mouth/Throat: Oropharynx is clear and moist.  Eyes: Conjunctivae are normal. Pupils are equal, round, and reactive to light.  Neck: Normal range of motion. No JVD present. No thyromegaly present.  Cardiovascular: Normal rate, regular rhythm, normal heart sounds and intact distal pulses.  Exam reveals no gallop and no friction rub.   No murmur heard. Pulmonary/Chest: Effort normal and breath  sounds normal. No respiratory distress. He has no wheezes. He has no rales. He exhibits no tenderness.  Abdominal: Soft. Bowel sounds are normal. He exhibits no distension and no mass. There is no tenderness. There is no rebound and no guarding.  Musculoskeletal: Normal range of motion. He exhibits no edema and no tenderness.  Lymphadenopathy:    He has no cervical adenopathy.  Neurological: He is alert and oriented to person, place, and time. He has normal reflexes. No cranial nerve deficit. He exhibits normal muscle tone. Coordination normal.  Skin: Skin is warm and dry. No rash noted.  Psychiatric: He has a normal mood and affect. His behavior is normal. Judgment and thought content normal.   Lab Results  Component Value Date   WBC 10.2 12/05/2010   HGB 10.5* 12/05/2010   HCT 32.3* 12/05/2010   PLT 186 12/05/2010   GLUCOSE 149* 03/29/2012   CHOL 175 03/29/2012   TRIG 297.0* 03/29/2012   HDL 36.90* 03/29/2012   LDLDIRECT 95.6 03/29/2012   LDLCALC 90 02/10/2011   ALT 22 12/15/2011   AST 19 12/15/2011   NA 141 03/29/2012   K 4.4 03/29/2012   CL 108 03/29/2012   CREATININE 2.4* 03/29/2012   BUN 33* 03/29/2012   CO2 24 03/29/2012   TSH 1.23 04/17/2010   PSA 0.99 04/17/2010   INR 1.0 01/21/2009   HGBA1C 6.5 03/29/2012          Assessment & Plan:

## 2012-07-06 NOTE — Assessment & Plan Note (Signed)
Continue with current prescription therapy as reflected on the Med list.  

## 2012-07-06 NOTE — Telephone Encounter (Signed)
Done

## 2012-07-06 NOTE — Assessment & Plan Note (Addendum)
We are watching his labs. He just ha his labs w/Dr Hyman Hopes

## 2012-07-06 NOTE — Telephone Encounter (Signed)
OK to fill this prescription with additional refills x2 Thank you!  

## 2012-07-06 NOTE — Telephone Encounter (Signed)
Rf req for Temazepam 15 mg 1-2 po qhs. Last filled 06/10/12. Ok to Rf?

## 2012-07-06 NOTE — Assessment & Plan Note (Signed)
Chronic  Potential benefits of a long term benzodiazepines  use as well as potential risks  and complications were explained to the patient and were aknowledged. 

## 2012-07-15 ENCOUNTER — Encounter: Payer: Self-pay | Admitting: Internal Medicine

## 2012-08-02 ENCOUNTER — Other Ambulatory Visit: Payer: Self-pay | Admitting: Internal Medicine

## 2012-08-03 ENCOUNTER — Ambulatory Visit: Payer: Medicare Other | Admitting: Internal Medicine

## 2012-08-03 NOTE — Telephone Encounter (Signed)
Ok to Rf? 

## 2012-08-05 ENCOUNTER — Other Ambulatory Visit: Payer: Self-pay | Admitting: Internal Medicine

## 2012-08-05 NOTE — Telephone Encounter (Signed)
Ok to Rf? 

## 2012-08-05 NOTE — Telephone Encounter (Signed)
Please contact pt and advise on status of this refill.

## 2012-08-08 ENCOUNTER — Other Ambulatory Visit: Payer: Self-pay | Admitting: Internal Medicine

## 2012-08-08 ENCOUNTER — Telehealth: Payer: Self-pay | Admitting: Internal Medicine

## 2012-08-08 NOTE — Telephone Encounter (Signed)
Ok to Rf? 

## 2012-08-08 NOTE — Telephone Encounter (Signed)
Patient has questions about his medications and whether or not he should still be taking them

## 2012-08-08 NOTE — Telephone Encounter (Signed)
Pt wants to know about his alprazolam Rf. Closing phone note. See Rf encounter

## 2012-08-10 ENCOUNTER — Telehealth: Payer: Self-pay | Admitting: Internal Medicine

## 2012-08-10 ENCOUNTER — Other Ambulatory Visit: Payer: Self-pay | Admitting: Internal Medicine

## 2012-08-10 NOTE — Telephone Encounter (Signed)
Rf phoned in. Pt informed  

## 2012-08-10 NOTE — Telephone Encounter (Signed)
The patient called the triage line and stated his Xanax rx was not at the pharmacy.  He is hoping to find out how this can be called in again.  His callback - [phone]   Thanks!

## 2012-08-11 ENCOUNTER — Observation Stay (HOSPITAL_COMMUNITY)
Admission: EM | Admit: 2012-08-11 | Discharge: 2012-08-12 | Disposition: A | Discharge status: Home / Self Care | Payer: Medicare Other | Attending: Internal Medicine | Admitting: Internal Medicine

## 2012-08-11 ENCOUNTER — Other Ambulatory Visit (INDEPENDENT_AMBULATORY_CARE_PROVIDER_SITE_OTHER): Payer: Medicare Other

## 2012-08-11 ENCOUNTER — Ambulatory Visit (INDEPENDENT_AMBULATORY_CARE_PROVIDER_SITE_OTHER): Payer: Medicare Other | Admitting: Internal Medicine

## 2012-08-11 ENCOUNTER — Encounter (HOSPITAL_COMMUNITY): Payer: Self-pay | Admitting: *Deleted

## 2012-08-11 ENCOUNTER — Encounter: Payer: Self-pay | Admitting: Internal Medicine

## 2012-08-11 ENCOUNTER — Telehealth: Payer: Self-pay | Admitting: Internal Medicine

## 2012-08-11 VITALS — BP 112/64 | HR 78 | Temp 97.5°F | Resp 16 | Wt 179.0 lb

## 2012-08-11 DIAGNOSIS — I798 Other disorders of arteries, arterioles and capillaries in diseases classified elsewhere: Secondary | ICD-10-CM

## 2012-08-11 DIAGNOSIS — E1151 Type 2 diabetes mellitus with diabetic peripheral angiopathy without gangrene: Secondary | ICD-10-CM | POA: Diagnosis present

## 2012-08-11 DIAGNOSIS — N179 Acute kidney failure, unspecified: Principal | ICD-10-CM | POA: Diagnosis present

## 2012-08-11 DIAGNOSIS — I2510 Atherosclerotic heart disease of native coronary artery without angina pectoris: Secondary | ICD-10-CM | POA: Insufficient documentation

## 2012-08-11 DIAGNOSIS — E875 Hyperkalemia: Secondary | ICD-10-CM

## 2012-08-11 DIAGNOSIS — E785 Hyperlipidemia, unspecified: Secondary | ICD-10-CM

## 2012-08-11 DIAGNOSIS — D649 Anemia, unspecified: Secondary | ICD-10-CM | POA: Insufficient documentation

## 2012-08-11 DIAGNOSIS — I129 Hypertensive chronic kidney disease with stage 1 through stage 4 chronic kidney disease, or unspecified chronic kidney disease: Secondary | ICD-10-CM

## 2012-08-11 DIAGNOSIS — E1159 Type 2 diabetes mellitus with other circulatory complications: Secondary | ICD-10-CM

## 2012-08-11 DIAGNOSIS — A09 Infectious gastroenteritis and colitis, unspecified: Secondary | ICD-10-CM

## 2012-08-11 DIAGNOSIS — Z79899 Other long term (current) drug therapy: Secondary | ICD-10-CM

## 2012-08-11 DIAGNOSIS — J4489 Other specified chronic obstructive pulmonary disease: Secondary | ICD-10-CM | POA: Insufficient documentation

## 2012-08-11 DIAGNOSIS — E119 Type 2 diabetes mellitus without complications: Secondary | ICD-10-CM | POA: Insufficient documentation

## 2012-08-11 DIAGNOSIS — R197 Diarrhea, unspecified: Secondary | ICD-10-CM | POA: Diagnosis present

## 2012-08-11 DIAGNOSIS — I10 Essential (primary) hypertension: Secondary | ICD-10-CM

## 2012-08-11 DIAGNOSIS — I252 Old myocardial infarction: Secondary | ICD-10-CM | POA: Insufficient documentation

## 2012-08-11 DIAGNOSIS — J449 Chronic obstructive pulmonary disease, unspecified: Secondary | ICD-10-CM | POA: Insufficient documentation

## 2012-08-11 DIAGNOSIS — D72829 Elevated white blood cell count, unspecified: Secondary | ICD-10-CM | POA: Insufficient documentation

## 2012-08-11 DIAGNOSIS — N189 Chronic kidney disease, unspecified: Secondary | ICD-10-CM | POA: Diagnosis present

## 2012-08-11 LAB — CBC WITH DIFFERENTIAL/PLATELET
Basophils Relative: 0.1 % (ref 0.0–3.0)
Eosinophils Absolute: 0.3 10*3/uL (ref 0.0–0.7)
Eosinophils Relative: 2.1 % (ref 0.0–5.0)
HCT: 35.2 % — ABNORMAL LOW (ref 39.0–52.0)
Hemoglobin: 11.4 g/dL — ABNORMAL LOW (ref 13.0–17.0)
Lymphs Abs: 1.9 10*3/uL (ref 0.7–4.0)
MCHC: 32.3 g/dL (ref 30.0–36.0)
MCV: 92.2 fl (ref 78.0–100.0)
Monocytes Absolute: 1.1 10*3/uL — ABNORMAL HIGH (ref 0.1–1.0)
Neutro Abs: 12.6 10*3/uL — ABNORMAL HIGH (ref 1.4–7.7)
Neutrophils Relative %: 79.2 % — ABNORMAL HIGH (ref 43.0–77.0)
RBC: 3.82 Mil/uL — ABNORMAL LOW (ref 4.22–5.81)
WBC: 15.9 10*3/uL — ABNORMAL HIGH (ref 4.5–10.5)

## 2012-08-11 LAB — URINALYSIS, ROUTINE W REFLEX MICROSCOPIC
Bilirubin Urine: NEGATIVE
Ketones, ur: NEGATIVE mg/dL
Nitrite: NEGATIVE
Urobilinogen, UA: 0.2 mg/dL (ref 0.0–1.0)

## 2012-08-11 LAB — COMPREHENSIVE METABOLIC PANEL
AST: 25 U/L (ref 0–37)
Albumin: 3.8 g/dL (ref 3.5–5.2)
Alkaline Phosphatase: 79 U/L (ref 39–117)
BUN: 60 mg/dL — ABNORMAL HIGH (ref 6–23)
Glucose, Bld: 109 mg/dL — ABNORMAL HIGH (ref 70–99)
Potassium: 4.1 mEq/L (ref 3.5–5.1)
Total Bilirubin: 0.4 mg/dL (ref 0.3–1.2)

## 2012-08-11 LAB — LIPID PANEL
HDL: 31.4 mg/dL — ABNORMAL LOW (ref >39.00)
Triglycerides: 326 mg/dL — ABNORMAL HIGH (ref 0.0–149.0)
VLDL: 65.2 mg/dL — ABNORMAL HIGH (ref 0.0–40.0)

## 2012-08-11 LAB — LDL CHOLESTEROL, DIRECT: Direct LDL: 80.2 mg/dL

## 2012-08-11 MED ORDER — BUSPIRONE HCL 15 MG PO TABS
7.5000 mg | ORAL_TABLET | Freq: Two times a day (BID) | ORAL | Status: DC
  Administered 2012-08-11 – 2012-08-12 (×2): 7.5 mg via ORAL
  Filled 2012-08-11 (×3): qty 1

## 2012-08-11 MED ORDER — SODIUM CHLORIDE 0.9 % IV SOLN: INTRAVENOUS | Status: DC

## 2012-08-11 MED ORDER — GABAPENTIN 300 MG PO CAPS
300.0000 mg | ORAL_CAPSULE | Freq: Three times a day (TID) | ORAL | Status: DC
  Administered 2012-08-11 – 2012-08-12 (×3): 300 mg via ORAL
  Filled 2012-08-11 (×4): qty 1

## 2012-08-11 MED ORDER — ONDANSETRON HCL 4 MG/2ML IJ SOLN: 4.0000 mg | Freq: Three times a day (TID) | INTRAMUSCULAR | Status: AC | PRN

## 2012-08-11 MED ORDER — LABETALOL HCL 300 MG PO TABS
300.0000 mg | ORAL_TABLET | Freq: Two times a day (BID) | ORAL | Status: DC
  Administered 2012-08-11 – 2012-08-12 (×2): 300 mg via ORAL
  Filled 2012-08-11 (×3): qty 1

## 2012-08-11 MED ORDER — TEMAZEPAM 15 MG PO CAPS
15.0000 mg | ORAL_CAPSULE | Freq: Every evening | ORAL | Status: DC | PRN
  Administered 2012-08-11: 30 mg via ORAL
  Filled 2012-08-11: qty 2

## 2012-08-11 MED ORDER — CLONIDINE HCL 0.1 MG PO TABS
0.1000 mg | ORAL_TABLET | Freq: Two times a day (BID) | ORAL | Status: DC
  Administered 2012-08-11 – 2012-08-12 (×2): 0.1 mg via ORAL
  Filled 2012-08-11 (×3): qty 1

## 2012-08-11 MED ORDER — ONDANSETRON HCL 4 MG PO TABS: 4.0000 mg | ORAL_TABLET | Freq: Four times a day (QID) | ORAL | Status: DC | PRN

## 2012-08-11 MED ORDER — ASPIRIN 81 MG PO TBEC
81.0000 mg | DELAYED_RELEASE_TABLET | Freq: Every day | ORAL | Status: DC
  Administered 2012-08-12: 81 mg via ORAL
  Filled 2012-08-11: qty 1

## 2012-08-11 MED ORDER — METRONIDAZOLE 250 MG PO TABS: 250.0000 mg | ORAL_TABLET | Freq: Three times a day (TID) | ORAL | Status: DC

## 2012-08-11 MED ORDER — SODIUM BICARBONATE 650 MG PO TABS
1300.0000 mg | ORAL_TABLET | Freq: Every day | ORAL | Status: DC
  Administered 2012-08-12: 1300 mg via ORAL
  Filled 2012-08-11: qty 2

## 2012-08-11 MED ORDER — HYDRALAZINE HCL 20 MG/ML IJ SOLN
10.0000 mg | INTRAMUSCULAR | Status: DC | PRN
  Administered 2012-08-12: 10 mg via INTRAVENOUS
  Filled 2012-08-11: qty 1

## 2012-08-11 MED ORDER — NORTRIPTYLINE HCL 10 MG PO CAPS
10.0000 mg | ORAL_CAPSULE | Freq: Every day | ORAL | Status: DC
  Administered 2012-08-11: 10 mg via ORAL
  Filled 2012-08-11 (×2): qty 2

## 2012-08-11 MED ORDER — ACETAMINOPHEN 325 MG PO TABS: 650.0000 mg | ORAL_TABLET | Freq: Four times a day (QID) | ORAL | Status: DC | PRN

## 2012-08-11 MED ORDER — ONDANSETRON HCL 4 MG/2ML IJ SOLN: 4.0000 mg | Freq: Four times a day (QID) | INTRAMUSCULAR | Status: DC | PRN

## 2012-08-11 MED ORDER — ACETAMINOPHEN 650 MG RE SUPP: 650.0000 mg | Freq: Four times a day (QID) | RECTAL | Status: DC | PRN

## 2012-08-11 MED ORDER — SODIUM CHLORIDE 0.9 % IV BOLUS (SEPSIS)
500.0000 mL | Freq: Once | INTRAVENOUS | Status: AC
  Administered 2012-08-11: 500 mL via INTRAVENOUS

## 2012-08-11 MED ORDER — ATORVASTATIN CALCIUM 40 MG PO TABS
40.0000 mg | ORAL_TABLET | Freq: Every day | ORAL | Status: DC
  Filled 2012-08-11: qty 1

## 2012-08-11 MED ORDER — SODIUM CHLORIDE 0.9 % IV SOLN
INTRAVENOUS | Status: DC
  Administered 2012-08-12 (×2): via INTRAVENOUS

## 2012-08-11 MED ORDER — PAROXETINE HCL 20 MG PO TABS
20.0000 mg | ORAL_TABLET | Freq: Every day | ORAL | Status: DC
  Administered 2012-08-12: 20 mg via ORAL
  Filled 2012-08-11: qty 1

## 2012-08-11 MED ORDER — ALPRAZOLAM 0.5 MG PO TABS: 0.5000 mg | ORAL_TABLET | Freq: Two times a day (BID) | ORAL | Status: DC | PRN

## 2012-08-11 MED ORDER — INSULIN ASPART 100 UNIT/ML ~~LOC~~ SOLN: 0.0000 [IU] | Freq: Three times a day (TID) | SUBCUTANEOUS | Status: DC

## 2012-08-11 NOTE — Assessment & Plan Note (Signed)
I will check his a1c today 

## 2012-08-11 NOTE — Telephone Encounter (Signed)
Caller: Zoltan/Patient; Patient Name: [name]; PCP: Sanda Linger (Adults only); Best Callback Phone Number: [phone]. States that he was seen in office today by Dr. Yetta Barre for Diarrhea. States that Dr. Yetta Barre called his house earlier and told him that his kidney function test were off and he was dehydrated and for him to go to [facility] ED for fluids. States that he has been there for an hour and wanted to know if Dr. Yetta Barre was coming there. Advised Dr. Yetta Barre did not write any notes in his EHR/EPIC about this and to try and be patient and stay there as Dr. Yetta Barre advised him earlier. Patient states "there are 100 people in here and when you go to [facility], you go right back, I will try to be patient."

## 2012-08-11 NOTE — ED Notes (Signed)
Pt from home with reports of being sent by PCP for further evaluation due to abnormal labs. Pt reports that Dr. Yetta Barre (PCP) called him at home this afternoon and informed pt that he was dehydrated and that his kidney function was abnormal. Pt reports diarrhea and nausea with decreased appetite and PO intake for the last 2 weeks.

## 2012-08-11 NOTE — Assessment & Plan Note (Signed)
I will check his K+ level today 

## 2012-08-11 NOTE — Assessment & Plan Note (Signed)
I will recheck his renal function today 

## 2012-08-11 NOTE — Assessment & Plan Note (Signed)
I am concerned that he has c diff infection so I have prescribed flagyl (adjusted dose for renal impairment), I have ordered stool tests to look for infection and labs to look for dehydration, elevated WBC count, and anemia

## 2012-08-11 NOTE — ED Provider Notes (Signed)
History     CSN: [id number]  Arrival date & time 08/11/12  1657   First MD Initiated Contact with Patient 08/11/12 1939      Chief Complaint  Patient presents with  . Diarrhea  . Nausea  . Abnormal Lab     HPI Pt from home with reports of being sent by PCP for further evaluation due to abnormal labs. Pt reports that Dr. Yetta Barre (PCP) called him at home this afternoon and informed pt that he was dehydrated and that his kidney function was abnormal. Pt reports diarrhea and nausea with decreased appetite and PO intake for the last 2 weeks  Past Medical History  Diagnosis Date  . Renal insufficiency 2010  . Diabetes mellitus   . Hypertension   . Anxiety   . COPD (chronic obstructive pulmonary disease)   . Hyperlipemia   . GERD (gastroesophageal reflux disease)   . Barrett esophagus   . PVD (peripheral vascular disease)   . CAD (coronary artery disease)   . Tobacco user   . Depression   . Hiatal hernia   . Myocardial infarction 12/1996  . AAA (abdominal aortic aneurysm)     Past Surgical History  Procedure Date  . Tonsillectomy   . Nissen fundoplication   . Abdominal aortic aneurysm repair 2010    and right common iliac artery aneurysm, DR HAYES  . Upper gastrointestinal endoscopy 07/07/2011    barretts esophagus, hiatal hernia, gastritis  . Colonoscopy 05/07/2005    diverticulosis, internal and external hemorrhoids  . Abdominal aortic aneurysm repair     Family History  Problem Relation Age of Onset  . Coronary artery disease Other   . Diabetes Other   . Asthma Other   . Mental illness Mother     dementia  . Cancer Mother   . Heart disease Father   . Diabetes Father   . Stomach cancer Father   . Cancer Father   . Colon cancer Neg Hx   . Esophageal cancer Neg Hx     History  Substance Use Topics  . Smoking status: Current Every Day Smoker -- 1.0 packs/day for 54 years    Types: Cigarettes  . Smokeless tobacco: Never Used  . Alcohol Use: No      Review  of Systems  All other systems reviewed and are negative.    Allergies  Tramadol; Amlodipine besylate; Benazepril hcl; Penicillins; Risperidone; and Valsartan  Home Medications   Current Outpatient Rx  Name Route Sig Dispense Refill  . ALPRAZOLAM 0.5 MG PO TABS  TAKE 1 TABLET TWICE A DAY AS NEEDED 60 tablet 5  . ALPRAZOLAM 0.5 MG PO TABS Oral Take 0.5 mg by mouth 2 (two) times daily as needed. Anxiety    . ASPIRIN 81 MG PO TBEC Oral Take 81 mg by mouth daily.      . ATORVASTATIN CALCIUM 40 MG PO TABS Oral Take 40 mg by mouth daily.    . BUSPIRONE HCL 15 MG PO TABS Oral Take 7.5 mg by mouth 2 (two) times daily.    . CHOLECALCIFEROL 1000 UNITS PO TABS Oral Take 1,000 Units by mouth daily.      Marland Kitchen CLONIDINE HCL 0.1 MG PO TABS Oral Take 0.1 mg by mouth 2 (two) times daily.    Marland Kitchen GABAPENTIN 300 MG PO CAPS Oral Take 300 mg by mouth 3 (three) times daily.    Marland Kitchen GLIMEPIRIDE 1 MG PO TABS Oral Take 1 mg by mouth daily before breakfast.    .  LABETALOL HCL 300 MG PO TABS Oral Take 300 mg by mouth 2 (two) times daily.    Marland Kitchen METRONIDAZOLE 250 MG PO TABS Oral Take 250 mg by mouth 3 (three) times daily.    Marland Kitchen METRONIDAZOLE 250 MG PO TABS Oral Take 250 mg by mouth 3 (three) times daily.    Marland Kitchen NORTRIPTYLINE HCL 10 MG PO CAPS Oral Take 10-20 mg by mouth at bedtime.    . OMEPRAZOLE 40 MG PO CPDR Oral Take 40 mg by mouth 2 (two) times daily.    Marland Kitchen PAROXETINE HCL 20 MG PO TABS Oral Take 20 mg by mouth every morning.    Marland Kitchen SODIUM BICARBONATE 650 MG PO TABS Oral Take 1,300 mg by mouth daily.      Marland Kitchen TEMAZEPAM 15 MG PO CAPS Oral Take 15-30 mg by mouth at bedtime as needed. Insomnia    . TRIAMTERENE-HCTZ 37.5-25 MG PO TABS Oral Take 1 tablet by mouth daily.      BP 135/75  Pulse 78  Temp 97.8 F (36.6 C) (Oral)  Resp 24  SpO2 98%  Physical Exam  Nursing note and vitals reviewed. Constitutional: He is oriented to person, place, and time. He appears well-developed. No distress.  HENT:  Head: Normocephalic and  atraumatic.  Eyes: Pupils are equal, round, and reactive to light.  Neck: Normal range of motion.  Cardiovascular: Normal rate and intact distal pulses.   Pulmonary/Chest: No respiratory distress.  Abdominal: Normal appearance. He exhibits no distension. There is no tenderness. There is no rebound and no guarding.  Musculoskeletal: Normal range of motion.  Neurological: He is alert and oriented to person, place, and time. No cranial nerve deficit.  Skin: Skin is warm and dry. No rash noted.  Psychiatric: He has a normal mood and affect. His behavior is normal.    ED Course  Procedures (including critical care time)    Medications  nortriptyline (PAMELOR) 10 MG capsule (not administered)  cloNIDine (CATAPRES) 0.1 MG tablet (not administered)  metroNIDAZOLE (FLAGYL) 250 MG tablet (not administered)  gabapentin (NEURONTIN) 300 MG capsule (not administered)  labetalol (NORMODYNE) 300 MG tablet (not administered)  glimepiride (AMARYL) 1 MG tablet (not administered)  metroNIDAZOLE (FLAGYL) 250 MG tablet (not administered)  omeprazole (PRILOSEC) 40 MG capsule (not administered)  PARoxetine (PAXIL) 20 MG tablet (not administered)  temazepam (RESTORIL) 15 MG capsule (not administered)  triamterene-hydrochlorothiazide (MAXZIDE-25) 37.5-25 MG per tablet (not administered)  nortriptyline (PAMELOR) 10 MG capsule (not administered)  atorvastatin (LIPITOR) 40 MG tablet (not administered)  ALPRAZolam (XANAX) 0.5 MG tablet (not administered)  busPIRone (BUSPAR) 15 MG tablet (not administered)  0.9 %  sodium chloride infusion (not administered)  sodium chloride 0.9 % bolus 500 mL (500 mL Intravenous Given 08/11/12 2011)     Labs Reviewed  URINALYSIS, ROUTINE W REFLEX MICROSCOPIC   No results found.   1. Diarrhea, infectious, adult   2. Acute renal failure       MDM          Nelia Shi, MD 08/11/12 2016

## 2012-08-11 NOTE — Assessment & Plan Note (Signed)
FLP today 

## 2012-08-11 NOTE — ED Notes (Signed)
Pt's room [name] is being clean.  Floor will notify ED when room is ready.  Report given.

## 2012-08-11 NOTE — Telephone Encounter (Signed)
Ok to Rf? 

## 2012-08-11 NOTE — Progress Notes (Signed)
Subjective:    Patient ID: [name], male    DOB: June 06, 1941, 71 y.o.   MRN: [id number]  Diarrhea  This is a recurrent problem. Episode onset: for 2 weeks. The problem occurs 5 to 10 times per day. The problem has been unchanged. The stool consistency is described as watery. Associated symptoms include chills. Pertinent negatives include no abdominal pain, arthralgias, bloating, coughing, fever, headaches, increased  flatus, myalgias, sweats, URI, vomiting or weight loss. Nothing aggravates the symptoms. Risk factors include no known risk factors. He has tried anti-motility drug for the symptoms. The treatment provided no relief.      Review of Systems  Constitutional: Positive for chills and fatigue. Negative for fever, weight loss, diaphoresis, activity change, appetite change and unexpected weight change.  HENT: Negative.   Eyes: Negative.   Respiratory: Negative for cough, chest tightness, shortness of breath, wheezing and stridor.   Cardiovascular: Negative for chest pain, palpitations and leg swelling.  Gastrointestinal: Positive for nausea, diarrhea and rectal pain. Negative for vomiting, abdominal pain, constipation, blood in stool, abdominal distention, anal bleeding, bloating and flatus.  Genitourinary: Negative.   Musculoskeletal: Negative for myalgias and arthralgias.  Skin: Negative for color change, pallor, rash and wound.  Neurological: Positive for weakness and light-headedness. Negative for dizziness, tremors, seizures, syncope, facial asymmetry, speech difficulty, numbness and headaches.  Hematological: Negative for adenopathy. Does not bruise/bleed easily.  Psychiatric/Behavioral: Negative.        Objective:   Physical Exam  Vitals reviewed. Constitutional: He is oriented to person, place, and time. He appears well-developed and well-nourished.  Non-toxic appearance. He has a sickly appearance. He does not appear ill. No distress.  HENT:  Head: Normocephalic  and atraumatic.  Mouth/Throat: Oropharynx is clear and moist. Mucous membranes are not pale, dry and not cyanotic. No oropharyngeal exudate, posterior oropharyngeal edema, posterior oropharyngeal erythema or tonsillar abscesses.  Eyes: Conjunctivae normal are normal. Right eye exhibits no discharge. Left eye exhibits no discharge. No scleral icterus.  Neck: Normal range of motion. Neck supple. No JVD present. No tracheal deviation present. No thyromegaly present.  Cardiovascular: Normal rate, regular rhythm, normal heart sounds and intact distal pulses.  Exam reveals no gallop and no friction rub.   No murmur heard. Pulmonary/Chest: Effort normal and breath sounds normal. No stridor. No respiratory distress. He has no wheezes. He has no rales. He exhibits no tenderness.  Abdominal: Soft. Normal appearance. He exhibits no shifting dullness, no distension, no pulsatile liver, no fluid wave, no abdominal bruit, no ascites, no pulsatile midline mass and no mass. Bowel sounds are increased. There is no hepatosplenomegaly, splenomegaly or hepatomegaly. There is no tenderness. There is no rigidity, no rebound, no guarding, no CVA tenderness, no tenderness at McBurney's point and negative Murphy's sign. No hernia. Hernia confirmed negative in the ventral area, confirmed negative in the right inguinal area and confirmed negative in the left inguinal area.  Musculoskeletal: Normal range of motion. He exhibits no edema and no tenderness.  Lymphadenopathy:    He has no cervical adenopathy.  Neurological: He is oriented to person, place, and time.  Skin: Skin is warm and dry. No rash noted. He is not diaphoretic. No erythema. No pallor.  Psychiatric: He has a normal mood and affect. His behavior is normal. Judgment and thought content normal.      Lab Results  Component Value Date   WBC 10.2 12/05/2010   HGB 10.5* 12/05/2010   HCT 32.3* 12/05/2010   PLT 186 12/05/2010  GLUCOSE 149* 03/29/2012   CHOL 175  03/29/2012   TRIG 297.0* 03/29/2012   HDL 36.90* 03/29/2012   LDLDIRECT 95.6 03/29/2012   LDLCALC 90 02/10/2011   ALT 22 12/15/2011   AST 19 12/15/2011   NA 141 03/29/2012   K 4.4 03/29/2012   CL 108 03/29/2012   CREATININE 2.4* 03/29/2012   BUN 33* 03/29/2012   CO2 24 03/29/2012   TSH 1.23 04/17/2010   PSA 0.99 04/17/2010   INR 1.0 01/21/2009   HGBA1C 6.5 03/29/2012      Assessment & Plan:

## 2012-08-11 NOTE — H&P (Signed)
[name] is an 71 y.o. male.   Patient was seen and examined on Aug 11 2012. PCP - Dr. [name]. Chief Complaint: Abnormal labs. HPI: 71 year-old male with history of diabetes mellitus type 2, chronic kidney disease, hypertension, Barrett's esophagus status post ablation last one done in last month, CAD, AAA status post repair was referred to the ER because of worsening creatinine as checked by his primary care physician today at his office. Patient has been experiencing diarrhea for last 2 weeks. Denies any nausea vomiting or abdominal pain. The diarrhea was watery. Denies having used any antibiotics recently. Denies having been hospitalized except for the ablation procedure for his Barrett's esophagus which was done as outpatient. Patient has not received travel anywhere. Due to his persistent diarrhea his PCP had done basic labs recheck showed doubling of his creatinine levels and was referred to the ER. Patient otherwise is not in acute distress denies any chest pain shortness of breath dizziness.   Past Medical History  Diagnosis Date  . Renal insufficiency 2010  . Diabetes mellitus   . Hypertension   . Anxiety   . COPD (chronic obstructive pulmonary disease)   . Hyperlipemia   . GERD (gastroesophageal reflux disease)   . Barrett esophagus   . PVD (peripheral vascular disease)   . CAD (coronary artery disease)   . Tobacco user   . Depression   . Hiatal hernia   . Myocardial infarction 12/1996  . AAA (abdominal aortic aneurysm)     Past Surgical History  Procedure Date  . Tonsillectomy   . Nissen fundoplication   . Abdominal aortic aneurysm repair 2010    and right common iliac artery aneurysm, DR HAYES  . Upper gastrointestinal endoscopy 07/07/2011    barretts esophagus, hiatal hernia, gastritis  . Colonoscopy 05/07/2005    diverticulosis, internal and external hemorrhoids  . Abdominal aortic aneurysm repair     Family History  Problem Relation Age of Onset  . Coronary  artery disease Other   . Diabetes Other   . Asthma Other   . Mental illness Mother     dementia  . Cancer Mother   . Heart disease Father   . Diabetes Father   . Stomach cancer Father   . Cancer Father   . Colon cancer Neg Hx   . Esophageal cancer Neg Hx    Social History:  reports that he has been smoking Cigarettes.  He has a 54 pack-year smoking history. He has never used smokeless tobacco. He reports that he does not drink alcohol or use illicit drugs.  Allergies:  Allergies  Allergen Reactions  . Tramadol Nausea And Vomiting  . Amlodipine Besylate     REACTION: swelling  . Benazepril Hcl     REACTION: elev K  . Penicillins     REACTION: rash, itching  . Risperidone     REACTION: loss of motor skills  . Valsartan     REACTION: elev K     (Not in a hospital admission)  Results for orders placed in visit on 08/11/12 (from the past 48 hour(s))  LIPID PANEL     Status: Abnormal   Collection Time   08/11/12  2:13 PM      Component Value Range Comment   Cholesterol 154  0 - 200 mg/dL ATP III Classification       Desirable:  < 200 mg/dL               Borderline High:  200 - 239 mg/dL          High:  > = 161 mg/dL   Triglycerides 096.0 (*) 0.0 - 149.0 mg/dL Normal:  <454 mg/dLBorderline High:  150 - 199 mg/dL   HDL 09.81 (*) >19.14 mg/dL    VLDL 78.2 (*) 0.0 - 40.0 mg/dL    Total CHOL/HDL Ratio 5                  Men          Women1/2 Average Risk     3.4          3.3Average Risk          5.0          4.42X Average Risk          9.6          7.13X Average Risk          15.0          11.0                      COMPREHENSIVE METABOLIC PANEL     Status: Abnormal   Collection Time   08/11/12  2:13 PM      Component Value Range Comment   Sodium 135  135 - 145 mEq/L    Potassium 4.1  3.5 - 5.1 mEq/L    Chloride 105  96 - 112 mEq/L    CO2 20  19 - 32 mEq/L    Glucose, Bld 109 (*) 70 - 99 mg/dL    BUN 60 (*) 6 - 23 mg/dL    Creatinine, Ser 4.2 (*) 0.4 - 1.5 mg/dL    Total  Bilirubin 0.4  0.3 - 1.2 mg/dL    Alkaline Phosphatase 79  39 - 117 U/L    AST 25  0 - 37 U/L    ALT 17  0 - 53 U/L    Total Protein 7.5  6.0 - 8.3 g/dL    Albumin 3.8  3.5 - 5.2 g/dL    Calcium 8.8  8.4 - 95.6 mg/dL    GFR 21.30 (*) >86.57 mL/min   CBC WITH DIFFERENTIAL     Status: Abnormal   Collection Time   08/11/12  2:13 PM      Component Value Range Comment   WBC 15.9 (*) 4.5 - 10.5 K/uL    RBC 3.82 (*) 4.22 - 5.81 Mil/uL    Hemoglobin 11.4 (*) 13.0 - 17.0 g/dL    HCT 84.6 (*) 96.2 - 52.0 %    MCV 92.2  78.0 - 100.0 fl    MCHC 32.3  30.0 - 36.0 g/dL    RDW 95.2 (*) 84.1 - 14.6 %    Platelets 241.0  150.0 - 400.0 K/uL    Neutrophils Relative 79.2 (*) 43.0 - 77.0 %    Lymphocytes Relative 11.8 (*) 12.0 - 46.0 %    Monocytes Relative 6.8  3.0 - 12.0 %    Eosinophils Relative 2.1  0.0 - 5.0 %    Basophils Relative 0.1  0.0 - 3.0 %    Neutro Abs 12.6 (*) 1.4 - 7.7 K/uL    Lymphs Abs 1.9  0.7 - 4.0 K/uL    Monocytes Absolute 1.1 (*) 0.1 - 1.0 K/uL    Eosinophils Absolute 0.3  0.0 - 0.7 K/uL    Basophils Absolute 0.0  0.0 - 0.1 K/uL   HEMOGLOBIN A1C  Status: Abnormal   Collection Time   08/11/12  2:13 PM      Component Value Range Comment   Hemoglobin A1C 6.7 (*) 4.6 - 6.5 % Glycemic Control Guidelines for People with Diabetes:Non Diabetic:  <6%Goal of Therapy: <7%Additional Action Suggested:  >8%   LDL CHOLESTEROL, DIRECT     Status: Normal   Collection Time   08/11/12  2:13 PM      Component Value Range Comment   Direct LDL 80.2   Optimal:  <100 mg/dLNear or Above Optimal:  100-129 mg/dLBorderline High:  130-159 mg/dLHigh:  160-189 mg/dLVery High:  >190 mg/dL   No results found.  Review of Systems  Constitutional: Negative.   HENT: Negative.   Eyes: Negative.   Respiratory: Negative.   Cardiovascular: Negative.   Gastrointestinal: Positive for diarrhea.  Genitourinary: Negative.   Musculoskeletal: Negative.   Skin: Negative.   Neurological: Negative.     Endo/Heme/Allergies: Negative.   Psychiatric/Behavioral: Negative.     Blood pressure 135/75, pulse 78, temperature 97.8 F (36.6 C), temperature source Oral, resp. rate 24, SpO2 98.00%. Physical Exam  Constitutional: He is oriented to person, place, and time. He appears well-developed and well-nourished. No distress.  HENT:  Head: Normocephalic and atraumatic.  Right Ear: External ear normal.  Left Ear: External ear normal.  Nose: Nose normal.  Mouth/Throat: Oropharynx is clear and moist. No oropharyngeal exudate.  Eyes: Conjunctivae normal are normal. Pupils are equal, round, and reactive to light. Right eye exhibits no discharge. Left eye exhibits no discharge. No scleral icterus.  Neck: Normal range of motion. Neck supple.  Cardiovascular: Normal rate and regular rhythm.   Respiratory: Effort normal and breath sounds normal. No respiratory distress. He has no wheezes. He has no rales.  GI: Soft. Bowel sounds are normal. He exhibits no distension. There is no tenderness. There is no rebound and no guarding.  Musculoskeletal: Normal range of motion. He exhibits no edema and no tenderness.  Neurological: He is alert and oriented to person, place, and time.       Moves all extremities.  Skin: Skin is warm and dry. He is not diaphoretic.     Assessment/Plan #1. Acute renal failure on chronic kidney disease - at this time most likely cause for his worsening of his creatinine is diarrhea causing dehydration. Gently hydrate. Closely follow intake output and metabolic panel. UA is pending. Hold diuretics. #2. Diarrhea - check for C. difficile and stool cultures. Continue Flagyl for now given the leukocytosis. #3. Leukocytosis - probably related to colitis. Check stool cultures and C. difficile and patient on empiric antibiotics. Check lactic acid levels. Patient otherwise is asymptomatic. #4. Diabetes mellitus type 2 - since patient's creatinine has worsened I will hold off Amaryl and keep  patient on sliding-scale coverage. #5. Hypertension - hold diuretics but continue other medications. When necessary IV hydralazine for systolic blood pressure more than 180. #6. History of CAD and AAA status post repair - denies any chest pain or abdominal pain. #7. COPD - not wheezing. #8. Hyperlipidemia - continue statins. #9. Anemia probably from chronic kidney disease - follow CBC.  CODE STATUS -  Full code.    Eduard Clos 08/11/2012, 8:35 PM

## 2012-08-11 NOTE — Patient Instructions (Signed)
Diarrhea Infections caused by germs (bacterial) or a virus commonly cause diarrhea. Your caregiver has determined that with time, rest and fluids, the diarrhea should improve. In general, eat normally while drinking more water than usual. Although water may prevent dehydration, it does not contain salt and minerals (electrolytes). Broths, weak tea without caffeine and oral rehydration solutions (ORS) replace fluids and electrolytes. Small amounts of fluids should be taken frequently. Large amounts at one time may not be tolerated. Plain water may be harmful in infants and the elderly. Oral rehydrating solutions (ORS) are available at pharmacies and grocery stores. ORS replace water and important electrolytes in proper proportions. Sports drinks are not as effective as ORS and may be harmful due to sugars worsening diarrhea.  ORS is especially recommended for use in children with diarrhea. As a general guideline for children, replace any new fluid losses from diarrhea and/or vomiting with ORS as follows:  If your child weighs 22 pounds or under (10 kg or less), give 60-120 mL ( -  cup or 2 - 4 ounces) of ORS for each episode of diarrheal stool or vomiting episode.  If your child weighs more than 22 pounds (more than 10 kgs), give 120-240 mL ( - 1 cup or 4 - 8 ounces) of ORS for each diarrheal stool or episode of vomiting.  While correcting for dehydration, children should eat normally. However, foods high in sugar should be avoided because this may worsen diarrhea. Large amounts of carbonated soft drinks, juice, gelatin desserts and other highly sugared drinks should be avoided.  After correction of dehydration, other liquids that are appealing to the child may be added. Children should drink small amounts of fluids frequently and fluids should be increased as tolerated. Children should drink enough fluids to keep urine clear or pale yellow.  Adults should eat normally while drinking more fluids than  usual. Drink small amounts of fluids frequently and increase as tolerated. Drink enough fluids to keep urine clear or pale yellow. Broths, weak decaffeinated tea, lemon lime soft drinks (allowed to go flat) and ORS replace fluids and electrolytes.  Avoid:  Carbonated drinks.  Juice.  Extremely hot or cold fluids.  Caffeine drinks.  Fatty, greasy foods.  Alcohol.  Tobacco.  Too much intake of anything at one time.  Gelatin desserts.  Probiotics are active cultures of beneficial bacteria. They may lessen the amount and number of diarrheal stools in adults. Probiotics can be found in yogurt with active cultures and in supplements.  Wash hands well to avoid spreading bacteria and virus.  Anti-diarrheal medications are not recommended for infants and children.  Only take over-the-counter or prescription medicines for pain, discomfort or fever as directed by your caregiver. Do not give aspirin to children because it may cause Reye's Syndrome.  For adults, ask your caregiver if you should continue all prescribed and over-the-counter medicines.  If your caregiver has given you a follow-up appointment, it is very important to keep that appointment. Not keeping the appointment could result in a chronic or permanent injury, and disability. If there is any problem keeping the appointment, you must call back to this facility for assistance. SEEK IMMEDIATE MEDICAL CARE IF:   You or your child is unable to keep fluids down or other symptoms or problems become worse in spite of treatment.  Vomiting or diarrhea develops and becomes persistent.  There is vomiting of blood or bile (green material).  There is blood in the stool or the stools are black and   tarry.  There is no urine output in 6-8 hours or there is only a small amount of very dark urine.  Abdominal pain develops, increases or localizes.  You have a fever.  Your baby is older than 3 months with a rectal temperature of 102 F  (38.9 C) or higher.  Your baby is 3 months old or younger with a rectal temperature of 100.4 F (38 C) or higher.  You or your child develops excessive weakness, dizziness, fainting or extreme thirst.  You or your child develops a rash, stiff neck, severe headache or become irritable or sleepy and difficult to awaken. MAKE SURE YOU:   Understand these instructions.  Will watch your condition.  Will get help right away if you are not doing well or get worse. Document Released: 10/09/2002 Document Revised: 01/11/2012 Document Reviewed: 08/26/2009 Exit[employer] Patient Information 2013 [employer].  

## 2012-08-12 ENCOUNTER — Ambulatory Visit: Payer: Medicare Other | Admitting: Internal Medicine

## 2012-08-12 DIAGNOSIS — A09 Infectious gastroenteritis and colitis, unspecified: Secondary | ICD-10-CM

## 2012-08-12 LAB — CBC
Hemoglobin: 10 g/dL — ABNORMAL LOW (ref 13.0–17.0)
MCH: 30 pg (ref 26.0–34.0)
MCHC: 33.4 g/dL (ref 30.0–36.0)
Platelets: 232 10*3/uL (ref 150–400)

## 2012-08-12 LAB — CBC WITH DIFFERENTIAL/PLATELET
Eosinophils Relative: 5 % (ref 0–5)
HCT: 32.6 % — ABNORMAL LOW (ref 39.0–52.0)
Lymphocytes Relative: 21 % (ref 12–46)
Lymphs Abs: 2.6 10*3/uL (ref 0.7–4.0)
MCV: 89.6 fL (ref 78.0–100.0)
Monocytes Absolute: 1.3 10*3/uL — ABNORMAL HIGH (ref 0.1–1.0)
Platelets: 216 10*3/uL (ref 150–400)
RBC: 3.64 MIL/uL — ABNORMAL LOW (ref 4.22–5.81)
WBC: 12.2 10*3/uL — ABNORMAL HIGH (ref 4.0–10.5)

## 2012-08-12 LAB — GLUCOSE, CAPILLARY

## 2012-08-12 LAB — CLOSTRIDIUM DIFFICILE BY PCR: Toxigenic C. Difficile by PCR: NEGATIVE

## 2012-08-12 LAB — BASIC METABOLIC PANEL
BUN: 46 mg/dL — ABNORMAL HIGH (ref 6–23)
Calcium: 8.2 mg/dL — ABNORMAL LOW (ref 8.4–10.5)
Creatinine, Ser: 2.78 mg/dL — ABNORMAL HIGH (ref 0.50–1.35)
GFR calc non Af Amer: 21 mL/min — ABNORMAL LOW (ref >90)
Glucose, Bld: 83 mg/dL (ref 70–99)

## 2012-08-12 LAB — COMPREHENSIVE METABOLIC PANEL
Albumin: 3.5 g/dL (ref 3.5–5.2)
BUN: 54 mg/dL — ABNORMAL HIGH (ref 6–23)
Calcium: 8.4 mg/dL (ref 8.4–10.5)
Creatinine, Ser: 3.25 mg/dL — ABNORMAL HIGH (ref 0.50–1.35)
Total Protein: 6.8 g/dL (ref 6.0–8.3)

## 2012-08-12 MED ORDER — LOPERAMIDE HCL 2 MG PO CAPS
2.0000 mg | ORAL_CAPSULE | Freq: Once | ORAL | Status: AC
  Administered 2012-08-12: 2 mg via ORAL
  Filled 2012-08-12: qty 1

## 2012-08-12 MED ORDER — CIPROFLOXACIN HCL 250 MG PO TABS: 250.0000 mg | ORAL_TABLET | Freq: Two times a day (BID) | ORAL | Status: AC

## 2012-08-12 NOTE — Discharge Summary (Signed)
Physician Discharge Summary  [name] GNF:[id number] DOB: 1941/08/02 DOA: 08/11/2012  PCP: Sonda Primes, MD  Admit date: 08/11/2012 Discharge date: 08/12/2012  Recommendations for Outpatient Follow-up:  1. Patient will followup with primary care physician in the next few weeks  Discharge Diagnoses:  Principal Problem:  *Renal failure (ARF), acute on chronic Active Problems:  DM (diabetes mellitus), type 2 with peripheral vascular complications  Diarrhea  HTN (hypertension)   Discharge Condition: Improved, being discharged home  Diet recommendation: Carb modified heart healthy  Filed Weights   08/11/12 2308 08/12/12 0500  Weight: 84.6 kg (186 lb 8.2 oz) 85.1 kg (187 lb 9.8 oz)    History of present illness:  71 year-old male with history of diabetes mellitus type 2, chronic kidney disease, hypertension, Barrett's esophagus status post ablation last one done in last month, CAD, AAA status post repair was referred to the ER because of worsening creatinine as checked by his primary care physician today at his office. Patient has been experiencing diarrhea for last 2 weeks. Denies any nausea vomiting or abdominal pain. The diarrhea was watery. Denies having used any antibiotics recently. Denies having been hospitalized except for the ablation procedure for his Barrett's esophagus which was done as outpatient. Patient has not received travel anywhere. Due to his persistent diarrhea his PCP had done basic labs recheck showed doubling of his creatinine levels and was referred to the ER.  Hospital Course:  #1. Acute renal failure on chronic kidney disease - at this time most likely cause for his worsening of his creatinine is diarrhea causing dehydration. Gently hydrate. Closely follow intake output and metabolic panel. Diuretics were put on hold. Urinalysis was unremarkable. By hospital day 2, the patient's creatinine had come down to 3.25 from 4.2. He was continued on IV fluids. He  was ambulating well in the hallway and an afternoon labs were checked. His creatinine was down to 2.78 with a normal white count. At this point he was felt to be medically stable for discharge. He was having no more diarrhea.  #2. Diarrhea - check for C. difficile and stool cultures. Continue Flagyl for now given the leukocytosis. White blood cell count by hospital day 2 was down to 12.2. C. difficile cultures were negative. This is likely felt to be an enteritis but not related to C. difficile  #3. Leukocytosis - probably related to colitis. Check stool cultures and C. difficile and patient on empiric antibiotics.  Patient otherwise is asymptomatic.   #4. Diabetes mellitus type 2 - since patient's creatinine has worsened I will hold off Amaryl and keep patient on sliding-scale coverage.   #5. Hypertension - hold diuretics but continue other medications. When necessary IV hydralazine for systolic blood pressure more than 180.   #6. History of CAD and AAA status post repair - denies any chest pain or abdominal pain.   #7. COPD - not wheezing. Stable medical issue to his hospitalization  #8. Hyperlipidemia - continue statins.   #9. Anemia probably from chronic kidney disease - follow CBC.   Procedures:  None   Consultations:  None  Discharge Exam: Filed Vitals:   08/12/12 0500 08/12/12 0511 08/12/12 1010 08/12/12 1011  BP:  178/74 167/70   Pulse:  74  80  Temp:  98.1 F (36.7 C)    TempSrc:  Oral    Resp:  18    Height:      Weight: 85.1 kg (187 lb 9.8 oz)     SpO2:  98%  General: Alert and oriented x3, no acute distress, fatigued, looks about stated age HEENT: Normocephalic, atraumatic mucous membranes are slightly dry Cardiovascular: Regular rate and rhythm, S1-S2 Respiratory: Clear to auscultation bilaterally Abdomen: Soft, nontender, nondistended, positive bowel sounds Extremities: No clubbing or cyanosis, trace pitting edema  Discharge Instructions  Discharge  Orders    Future Appointments: Provider: Department: Dept Phone: Center:   08/18/2012 1:15 PM Etta Grandchild, MD [facility] [phone] [facility]   09/05/2012 2:15 PM Kathleene Hazel, MD [facility] [phone] [facility]   10/07/2012 10:45 AM Tresa Garter, MD [facility] [phone] [facility]   10/19/2012 9:00 AM [facility] [phone] [facility]   10/19/2012 9:30 AM Chuck Hint, MD [facility] [phone] [facility]       Medication List     As of 08/12/2012  1:52 PM    TAKE these medications         ALPRAZolam 0.5 MG tablet   Commonly known as: XANAX   TAKE 1 TABLET TWICE A DAY AS NEEDED      ALPRAZolam 0.5 MG tablet   Commonly known as: XANAX   Take 0.5 mg by mouth 2 (two) times daily as needed. Anxiety      aspirin 81 MG EC tablet   Take 81 mg by mouth daily.      atorvastatin 40 MG tablet   Commonly known as: LIPITOR   Take 40 mg by mouth daily.      busPIRone 15 MG tablet   Commonly known as: BUSPAR   Take 7.5 mg by mouth 2 (two) times daily.      Cholecalciferol 1000 UNITS tablet   Take 1,000 Units by mouth daily.      ciprofloxacin 250 MG tablet   Commonly known as: CIPRO   Take 1 tablet (250 mg total) by mouth 2 (two) times daily.      cloNIDine 0.1 MG tablet   Commonly known as: CATAPRES   Take 0.1 mg by mouth 2 (two) times daily.      gabapentin 300 MG capsule   Commonly known as: NEURONTIN   Take 300 mg by mouth 3 (three) times daily.      glimepiride 1 MG tablet   Commonly known as: AMARYL   Take 1 mg by mouth daily before breakfast.      labetalol 300 MG tablet   Commonly known as: NORMODYNE   Take 300 mg by mouth 2 (two) times daily.      metroNIDAZOLE 250 MG tablet   Commonly known as: FLAGYL   Take 250 mg by mouth 3 (three) times daily.      nortriptyline 10 MG capsule   Commonly known as: PAMELOR   Take 10-20 mg by mouth at bedtime.      omeprazole 40 MG capsule   Commonly known as:  PRILOSEC   Take 40 mg by mouth 2 (two) times daily.      PARoxetine 20 MG tablet   Commonly known as: PAXIL   Take 20 mg by mouth every morning.      sodium bicarbonate 650 MG tablet   Take 1,300 mg by mouth daily.      temazepam 15 MG capsule   Commonly known as: RESTORIL   Take 15-30 mg by mouth at bedtime as needed. Insomnia      triamterene-hydrochlorothiazide 37.5-25 MG per tablet   Commonly known as: MAXZIDE-25   Take 1 tablet by mouth daily.  Follow-up Information    Follow up with Sonda Primes, MD. Schedule an appointment as soon as possible for a visit in 2 weeks.   Contact information:   [address] [place] [address] [phone]           The results of significant diagnostics from this hospitalization (including imaging, microbiology, ancillary and laboratory) are listed below for reference.    Significant Diagnostic Studies: No results found.  Microbiology: Recent Results (from the past 240 hour(s))  CLOSTRIDIUM DIFFICILE BY PCR     Status: Normal   Collection Time   08/12/12  6:08 AM      Component Value Range Status Comment   C difficile by pcr NEGATIVE  NEGATIVE Final      Labs: Basic Metabolic Panel:  Lab 08/12/12 2130 08/11/12 1413  NA 138 135  K 3.5 4.1  CL 105 105  CO2 18* 20  GLUCOSE 60* 109*  BUN 54* 60*  CREATININE 3.25* 4.2*  CALCIUM 8.4 8.8  MG -- --  PHOS -- --   Liver Function Tests:  Lab 08/12/12 0405 08/11/12 1413  AST 19 25  ALT 12 17  ALKPHOS 84 79  BILITOT 0.2* 0.4  PROT 6.8 7.5  ALBUMIN 3.5 3.8   CBC:  Lab 08/12/12 0405 08/11/12 1413  WBC 12.2* 15.9*  NEUTROABS 7.7 12.6*  HGB 10.9* 11.4*  HCT 32.6* 35.2*  MCV 89.6 92.2  PLT 216 241.0   CBG:  Lab 08/12/12 1151 08/12/12 0734 08/11/12 2357 08/11/12 2319  GLUCAP 111* 94 122* 69*    Time coordinating discharge: 40 minutes  Signed:  Hollice Espy  [facility] 08/12/2012, 1:52 PM

## 2012-08-12 NOTE — Progress Notes (Signed)
Nutrition Brief Note  Patient identified on the Malnutrition Screening Tool (MST) report for weight loss, generating a score of 2.   Body mass index is 27.71 kg/(m^2). Pt meets criteria for overwieght based on current BMI.   Current diet order is Heart Healthy, patient is consuming approximately 75% of meals at this time. Labs and medications reviewed.   Pt with 2 week history of diarrhea.  Improving at this time.  BUN/Creat improving.  Boost prior to admit bid for the last several months.  No nutrition interventions warranted at this time. If nutrition issues arise, please consult RD.   Oran Rein, RD, LDN Clinical Inpatient Dietitian Pager:  [phone] Weekend and after hours pager:  [phone]

## 2012-08-15 LAB — STOOL CULTURE

## 2012-08-18 ENCOUNTER — Encounter: Payer: Self-pay | Admitting: Internal Medicine

## 2012-08-18 ENCOUNTER — Ambulatory Visit (INDEPENDENT_AMBULATORY_CARE_PROVIDER_SITE_OTHER): Payer: Medicare Other | Admitting: Internal Medicine

## 2012-08-18 VITALS — BP 110/78 | HR 80 | Temp 97.4°F | Resp 16 | Wt 180.0 lb

## 2012-08-18 DIAGNOSIS — A09 Infectious gastroenteritis and colitis, unspecified: Secondary | ICD-10-CM

## 2012-08-18 DIAGNOSIS — I129 Hypertensive chronic kidney disease with stage 1 through stage 4 chronic kidney disease, or unspecified chronic kidney disease: Secondary | ICD-10-CM

## 2012-08-18 NOTE — Patient Instructions (Signed)

## 2012-08-18 NOTE — Progress Notes (Signed)
  Subjective:    Patient ID: [name], male    DOB: Mar 21, 1941, 71 y.o.   MRN: [id number]  HPI  He returns for f/up after a brief stay in the hospital last week for a diarrheal illness with dehydration, his stool studies were neg. He has been on cipro since leaving the hospital and he feels well and he tells me that the diarrhea has resolved. His renal function improved in the hospital with IV fluids. He has has good po intake over the last few days.  Review of Systems  Constitutional: Negative for fever, chills, diaphoresis, activity change, appetite change, fatigue and unexpected weight change.  HENT: Negative.   Eyes: Negative.   Respiratory: Negative for cough, choking, shortness of breath, wheezing and stridor.   Cardiovascular: Negative for chest pain, palpitations and leg swelling.  Gastrointestinal: Negative for nausea, vomiting, abdominal pain, diarrhea, constipation and blood in stool.  Genitourinary: Negative.   Musculoskeletal: Negative.   Skin: Negative.   Neurological: Negative for dizziness, tremors, weakness and light-headedness.  Hematological: Negative for adenopathy. Does not bruise/bleed easily.  Psychiatric/Behavioral: Negative.        Objective:   Physical Exam  Vitals reviewed. Constitutional: He is oriented to person, place, and time. He appears well-developed and well-nourished.  Non-toxic appearance. He does not have a sickly appearance. He does not appear ill. No distress.  HENT:  Head: Normocephalic and atraumatic.  Mouth/Throat: Oropharynx is clear and moist. No oropharyngeal exudate.  Eyes: Conjunctivae normal are normal. Right eye exhibits no discharge. Left eye exhibits no discharge. No scleral icterus.  Neck: Normal range of motion. Neck supple. No JVD present. No tracheal deviation present. No thyromegaly present.  Cardiovascular: Normal rate, regular rhythm, normal heart sounds and intact distal pulses.  Exam reveals no gallop and no friction  rub.   No murmur heard. Pulmonary/Chest: Effort normal and breath sounds normal. No stridor. No respiratory distress. He has no wheezes. He has no rales. He exhibits no tenderness.  Abdominal: Soft. Bowel sounds are normal. He exhibits no distension and no mass. There is no tenderness. There is no rebound and no guarding.  Musculoskeletal: Normal range of motion. He exhibits no edema and no tenderness.  Lymphadenopathy:    He has no cervical adenopathy.  Neurological: He is oriented to person, place, and time.  Skin: Skin is warm and dry. No rash noted. He is not diaphoretic. No erythema. No pallor.  Psychiatric: He has a normal mood and affect. His behavior is normal. Judgment and thought content normal.     Lab Results  Component Value Date   WBC 10.4 08/12/2012   HGB 10.0* 08/12/2012   HCT 29.9* 08/12/2012   PLT 232 08/12/2012   GLUCOSE 83 08/12/2012   CHOL 154 08/11/2012   TRIG 326.0* 08/11/2012   HDL 31.40* 08/11/2012   LDLDIRECT 80.2 08/11/2012   LDLCALC 90 02/10/2011   ALT 12 08/12/2012   AST 19 08/12/2012   NA 138 08/12/2012   K 3.6 08/12/2012   CL 107 08/12/2012   CREATININE 2.78* 08/12/2012   BUN 46* 08/12/2012   CO2 19 08/12/2012   TSH 1.23 04/17/2010   PSA 0.99 04/17/2010   INR 1.0 01/21/2009   HGBA1C 6.7* 08/11/2012       Assessment & Plan:

## 2012-08-19 ENCOUNTER — Encounter: Payer: Self-pay | Admitting: Internal Medicine

## 2012-08-19 NOTE — Assessment & Plan Note (Signed)
Resolved on cipro 

## 2012-08-19 NOTE — Assessment & Plan Note (Signed)
His BP is well controlled and his renal function has stabilized

## 2012-08-20 ENCOUNTER — Other Ambulatory Visit: Payer: Self-pay | Admitting: Internal Medicine

## 2012-08-24 ENCOUNTER — Other Ambulatory Visit: Payer: Self-pay | Admitting: Internal Medicine

## 2012-08-25 ENCOUNTER — Other Ambulatory Visit: Payer: Self-pay | Admitting: General Practice

## 2012-08-25 MED ORDER — BUSPIRONE HCL 15 MG PO TABS: 7.5000 mg | ORAL_TABLET | Freq: Two times a day (BID) | ORAL | Status: DC

## 2012-08-31 ENCOUNTER — Ambulatory Visit: Payer: Medicare Other | Admitting: Vascular Surgery

## 2012-08-31 ENCOUNTER — Other Ambulatory Visit: Payer: Medicare Other

## 2012-09-05 ENCOUNTER — Encounter: Payer: Self-pay | Admitting: Cardiovascular Disease

## 2012-09-05 ENCOUNTER — Ambulatory Visit (INDEPENDENT_AMBULATORY_CARE_PROVIDER_SITE_OTHER): Payer: Medicare Other | Admitting: Cardiovascular Disease

## 2012-09-05 VITALS — BP 118/77 | HR 80 | Ht 69.0 in | Wt 178.0 lb

## 2012-09-05 DIAGNOSIS — I10 Essential (primary) hypertension: Secondary | ICD-10-CM

## 2012-09-05 DIAGNOSIS — I2510 Atherosclerotic heart disease of native coronary artery without angina pectoris: Secondary | ICD-10-CM

## 2012-09-05 NOTE — Patient Instructions (Signed)
Your physician wants you to follow-up in:  12 months. You will receive a reminder letter in the mail two months in advance. If you don't receive a letter, please call our office to schedule the follow-up appointment.  Your physician has requested that you have a lexiscan myoview. For further information please visit [email or web address]. Please follow instruction sheet, as given.    

## 2012-09-05 NOTE — Progress Notes (Signed)
History of Present Illness: 71 yo male with history of CAD, HTN, HLD, DM, CRI, AAA s/p repair, tobacco abuse, GERD and Barretts esophagus here today for cardiac follow up. He has been followed in the past by Dr. Juanda Chance. In 1998 he had a bare metal stent placed in the right coronary artery. His last cardiac catheterization was in 2001 at which time he had nonobstructive coronary disease. He was seen by Dr. 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 Carotid artery dopplers were negative. He has been intolerant to several medications for his blood pressure in the past. He also has chronic renal insufficiency followed by Dr. Hyman Hopes with a creatinine that is about 2.5. 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 Madilyn Fireman. 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   He tells me that he is feeling well. No chest pain or SOB. No palpitations. Tolerating all meds. He has been going to [facility] for ablations of his Barretts esophagitis. He does continue to smoke about one half pack of cigarettes per day.  Primary Care Physician: Dr. [name].  Last Lipid Profile:Lipid Panel     Component Value Date/Time   CHOL 154 08/11/2012 1413   TRIG 326.0* 08/11/2012 1413   HDL 31.40* 08/11/2012 1413   CHOLHDL 5 08/11/2012 1413   VLDL 65.2* 08/11/2012 1413   LDLCALC 90 02/10/2011 0831     Past Medical History  Diagnosis Date  . Renal insufficiency 2010  . Diabetes mellitus   . Hypertension   . Anxiety   . COPD (chronic obstructive pulmonary disease)   . Hyperlipemia   . GERD (gastroesophageal reflux disease)   . Barrett esophagus   . PVD (peripheral vascular disease)   . CAD (coronary artery disease)   . Tobacco user   . Depression   . Hiatal hernia   . Myocardial infarction 12/1996  . AAA (abdominal aortic aneurysm)     Past Surgical History  Procedure Date  . Tonsillectomy   . Nissen fundoplication   . Abdominal aortic  aneurysm repair 2010    and right common iliac artery aneurysm, DR HAYES  . Upper gastrointestinal endoscopy 07/07/2011    barretts esophagus, hiatal hernia, gastritis, ablations in esophagus  . Colonoscopy 05/07/2005    diverticulosis, internal and external hemorrhoids  . Abdominal aortic aneurysm repair     Current Outpatient Prescriptions  Medication Sig Dispense Refill  . ALPRAZolam (XANAX) 0.5 MG tablet TAKE 1 TABLET TWICE A DAY AS NEEDED  60 tablet  5  . aspirin 81 MG EC tablet Take 81 mg by mouth daily.        Marland Kitchen atorvastatin (LIPITOR) 40 MG tablet Take 40 mg by mouth daily.      . busPIRone (BUSPAR) 15 MG tablet TAKE 0.5 TABLETS (7.5 MG TOTAL) BY MOUTH 2 (TWO) TIMES DAILY.  90 tablet  1  . busPIRone (BUSPAR) 15 MG tablet Take 0.5 tablets (7.5 mg total) by mouth 2 (two) times daily.  60 tablet  2  . Cholecalciferol 1000 UNITS tablet Take 1,000 Units by mouth daily.        . cloNIDine (CATAPRES) 0.1 MG tablet Take 0.1 mg by mouth 2 (two) times daily.      Marland Kitchen gabapentin (NEURONTIN) 300 MG capsule Take 300 mg by mouth 3 (three) times daily.      Marland Kitchen gabapentin (NEURONTIN) 300 MG capsule TAKE 1 CAPSULE (300 MG TOTAL) BY  MOUTH 3 (THREE) TIMES DAILY.  270 capsule  1  . glimepiride (AMARYL) 1 MG tablet Take 1 mg by mouth daily before breakfast.      . labetalol (NORMODYNE) 300 MG tablet Take 300 mg by mouth 2 (two) times daily.      . nortriptyline (PAMELOR) 10 MG capsule Take 10-20 mg by mouth at bedtime.      Marland Kitchen omeprazole (PRILOSEC) 40 MG capsule Take 40 mg by mouth 2 (two) times daily.      Marland Kitchen PARoxetine (PAXIL) 20 MG tablet Take 20 mg by mouth every morning.      . sodium bicarbonate 650 MG tablet Take 1,300 mg by mouth daily.        . temazepam (RESTORIL) 15 MG capsule Take 15-30 mg by mouth at bedtime as needed. Insomnia      . triamterene-hydrochlorothiazide (MAXZIDE-25) 37.5-25 MG per tablet Take 1 tablet by mouth daily.        Allergies  Allergen Reactions  . Tramadol Nausea And  Vomiting  . Amlodipine Besylate     REACTION: swelling  . Benazepril Hcl     REACTION: elev K  . Penicillins     REACTION: rash, itching  . Risperidone     REACTION: loss of motor skills  . Valsartan     REACTION: elev K    History   Social History  . Marital Status: Married    Spouse Name: N/A    Number of Children: 3  . Years of Education: N/A   Occupational History  . Retired Retail banker    Social History Main Topics  . Smoking status: Current Every Day Smoker -- 1.0 packs/day for 54 years    Types: Cigarettes  . Smokeless tobacco: Never Used  . Alcohol Use: No  . Drug Use: No  . Sexually Active: Not on file   Other Topics Concern  . Not on file   Social History Narrative   Regular Exercise -  NO    Family History  Problem Relation Age of Onset  . Coronary artery disease Other   . Diabetes Other   . Asthma Other   . Mental illness Mother     dementia  . Cancer Mother   . Heart disease Father   . Diabetes Father   . Stomach cancer Father   . Cancer Father   . Colon cancer Neg Hx   . Esophageal cancer Neg Hx     Review of Systems:  As stated in the HPI and otherwise negative.   BP 118/77  Pulse 80  Ht 5\' 9"  (1.753 m)  Wt 178 lb (80.74 kg)  BMI 26.29 kg/m2  Physical Examination: General: Well developed, well nourished, NAD HEENT: OP clear, mucus membranes moist SKIN: warm, dry. No rashes. Neuro: No focal deficits Musculoskeletal: Muscle strength 5/5 all ext Psychiatric: Mood and affect normal Neck: No JVD, no carotid bruits, no thyromegaly, no lymphadenopathy. Lungs:Clear bilaterally, no wheezes, rhonci, crackles Cardiovascular: Regular rate and rhythm. No murmurs, gallops or rubs. Abdomen:Soft. Bowel sounds present. Non-tender.  Extremities: No lower extremity edema. Pulses are 2 + in the bilateral DP/PT.  EKG: NSR, rate 80 bpm. QTc .   Assessment and Plan:   1. CAD: He is on good medical therapy. Feeling well overall. No recent  stress testing. Given his ongoing tobacco abuse and known CAD with prior PCI, will arrange Lexiscan myoview to exclude ischemia. NO changes in medical therapy.   2. HTN: BP controlled.

## 2012-09-12 ENCOUNTER — Telehealth: Payer: Self-pay | Admitting: *Deleted

## 2012-09-12 MED ORDER — LABETALOL HCL 300 MG PO TABS: 300.0000 mg | ORAL_TABLET | Freq: Three times a day (TID) | ORAL | Status: DC

## 2012-09-12 NOTE — Telephone Encounter (Signed)
Rec fax from pharmacy stating pt was advised to take his Labetalol 300 mg 1 po tid. Is this correct? Our med list states bid. Please advise

## 2012-09-12 NOTE — Telephone Encounter (Signed)
TID is correct Thx

## 2012-09-19 ENCOUNTER — Encounter (HOSPITAL_COMMUNITY): Payer: Medicare Other

## 2012-09-19 ENCOUNTER — Ambulatory Visit (HOSPITAL_COMMUNITY): Payer: Medicare Other | Attending: Cardiology | Admitting: Radiology

## 2012-09-19 VITALS — BP 148/80 | Ht 68.0 in | Wt 180.0 lb

## 2012-09-19 DIAGNOSIS — J4489 Other specified chronic obstructive pulmonary disease: Secondary | ICD-10-CM | POA: Insufficient documentation

## 2012-09-19 DIAGNOSIS — J449 Chronic obstructive pulmonary disease, unspecified: Secondary | ICD-10-CM | POA: Insufficient documentation

## 2012-09-19 DIAGNOSIS — R0609 Other forms of dyspnea: Secondary | ICD-10-CM | POA: Insufficient documentation

## 2012-09-19 DIAGNOSIS — I2510 Atherosclerotic heart disease of native coronary artery without angina pectoris: Secondary | ICD-10-CM

## 2012-09-19 DIAGNOSIS — I10 Essential (primary) hypertension: Secondary | ICD-10-CM | POA: Insufficient documentation

## 2012-09-19 DIAGNOSIS — R079 Chest pain, unspecified: Secondary | ICD-10-CM | POA: Insufficient documentation

## 2012-09-19 DIAGNOSIS — E119 Type 2 diabetes mellitus without complications: Secondary | ICD-10-CM | POA: Insufficient documentation

## 2012-09-19 DIAGNOSIS — R0989 Other specified symptoms and signs involving the circulatory and respiratory systems: Secondary | ICD-10-CM | POA: Insufficient documentation

## 2012-09-19 DIAGNOSIS — R0602 Shortness of breath: Secondary | ICD-10-CM

## 2012-09-19 DIAGNOSIS — F17200 Nicotine dependence, unspecified, uncomplicated: Secondary | ICD-10-CM | POA: Insufficient documentation

## 2012-09-19 MED ORDER — TECHNETIUM TC 99M SESTAMIBI GENERIC - CARDIOLITE
30.0000 | Freq: Once | INTRAVENOUS | Status: AC | PRN
  Administered 2012-09-19: 30 via INTRAVENOUS

## 2012-09-19 MED ORDER — TECHNETIUM TC 99M SESTAMIBI GENERIC - CARDIOLITE
10.0000 | Freq: Once | INTRAVENOUS | Status: AC | PRN
  Administered 2012-09-19: 10 via INTRAVENOUS

## 2012-09-19 MED ORDER — REGADENOSON 0.4 MG/5ML IV SOLN
0.4000 mg | Freq: Once | INTRAVENOUS | Status: AC
  Administered 2012-09-19: 0.4 mg via INTRAVENOUS

## 2012-09-19 NOTE — Progress Notes (Signed)
[facility] SITE 3 NUCLEAR MED [id number] [place] [address] [phone]  Cardiology Nuclear Med Study  [name] is a 71 y.o. male     MRN : [id number]     DOB: [birth year]  Procedure Date: 09/19/2012  Nuclear Med Background Indication for Stress Test:  Evaluation for Ischemia and Stent Patency History:  1998 Stent RCA, COPD, 2001 Heart Cath: patent stent N/O DZ Cardiac Risk Factors: Family Hx, hypertension, Lipids, NIDDM,Smoker PVD: AAA repair 2010 (R) iliac aneurysm  Symptoms:  Chest Pain, DOE, Fatigue and Fatigue with Exertion   Nuclear Pre-Procedure Caffeine/Decaff Intake:  None NPO After: 6:00 pm   Lungs:  clear O2 Sat: 98% on  Room air IV 0.9% NS with Angio Cath:  20g  IV Site: R Antecubital  IV Started by:  Milana Na, EMT-P  Chest Size (in):  44 Cup Size: n/a  Height: 5\' 8"  (1.727 m)  Weight:  180 lb (81.647 kg)  BMI:  Body mass index is 27.37 kg/(m^2). Tech Comments: This patient came in hypertensive and took his Rx this am. DOD Dr. Golden Circle was consulted and said he could rest  30 minutes and then his BP came down and he was sent home.    Nuclear Med Study 1 or 2 day study: 1 day  Stress Test Type:  Eugenie Birks  Reading MD: Willa Rough, MD  Order Authorizing Provider:  C.McAlhany  MD  MDResting Radionuclide: Technetium 87m Sestamibi  Resting Radionuclide Dose: 11.0 mCi   Stress Radionuclide:  Technetium 23m Sestamibi  Stress Radionuclide Dose: 33.0 mCi           Stress Protocol Rest HR: 73 Stress HR: 94  Rest BP: 169/114 Stress BP: 203/110  Exercise Time (min): n/a METS: n/a   Predicted Max HR: 155 bpm % Max HR: 94.19 bpm Rate Pressure Product: 62952   Dose of Adenosine (mg):  n/a Dose of Lexiscan: n/a mg  Dose of Atropine (mg): n/a Dose of Dobutamine: n/a mcg/kg/min (at max HR)  Stress Test Technologist: Milana Na, EMT-P  Nuclear Technologist:  Domenic Polite, CNMT     Rest Procedure:  Myocardial  perfusion imaging was performed at rest 45 minutes following the intravenous administration of Technetium 65m Sestamibi. Rest ECG: NSR - Normal EKG  Stress Procedure:  The patient performed treadmill exercise using a Bruce  Protocol for 10:11 minutes. The patient stopped due to sob, leg fatigue and denied any chest pain.  There were non specific ST-T wave changes and sob.  Technetium 66m Sestamibi was injected at peak exercise and myocardial perfusion imaging was performed after a brief delay. Stress ECG: No significant change from baseline ECG  QPS Raw Data Images:  Patient motion noted; appropriate software correction applied. Stress Images:  Normal homogeneous uptake in all areas of the myocardium. Rest Images:  Normal homogeneous uptake in all areas of the myocardium. Subtraction (SDS):  No evidence of ischemia. Transient Ischemic Dilatation (Normal <1.22):  1.00 Lung/Heart Ratio (Normal <0.45):  0.30  Quantitative Gated Spect Images QGS EDV:  109 ml QGS ESV:  48 ml  Impression Exercise Capacity:  Lexiscan with no exercise. BP Response:  Normal blood pressure response. Clinical Symptoms:  shortness of breath ECG Impression:  No significant ST segment change suggestive of ischemia. Comparison with Prior Nuclear Study: No images to compare  Overall Impression:  Normal stress nuclear study.  LV Ejection Fraction: 56%.  LV Wall Motion:  Normal Wall Motion.  Willa Rough, MD   .

## 2012-09-20 ENCOUNTER — Other Ambulatory Visit: Payer: Self-pay | Admitting: Internal Medicine

## 2012-09-22 ENCOUNTER — Other Ambulatory Visit: Payer: Self-pay | Admitting: Internal Medicine

## 2012-09-22 NOTE — Telephone Encounter (Signed)
Ok to Rf? 

## 2012-09-26 ENCOUNTER — Other Ambulatory Visit (INDEPENDENT_AMBULATORY_CARE_PROVIDER_SITE_OTHER): Payer: Medicare Other

## 2012-09-26 DIAGNOSIS — F17200 Nicotine dependence, unspecified, uncomplicated: Secondary | ICD-10-CM

## 2012-09-26 DIAGNOSIS — G4700 Insomnia, unspecified: Secondary | ICD-10-CM

## 2012-09-26 DIAGNOSIS — E1151 Type 2 diabetes mellitus with diabetic peripheral angiopathy without gangrene: Secondary | ICD-10-CM

## 2012-09-26 DIAGNOSIS — E119 Type 2 diabetes mellitus without complications: Secondary | ICD-10-CM

## 2012-09-26 DIAGNOSIS — I798 Other disorders of arteries, arterioles and capillaries in diseases classified elsewhere: Secondary | ICD-10-CM

## 2012-09-26 DIAGNOSIS — K2270 Barrett's esophagus without dysplasia: Secondary | ICD-10-CM

## 2012-09-26 DIAGNOSIS — I129 Hypertensive chronic kidney disease with stage 1 through stage 4 chronic kidney disease, or unspecified chronic kidney disease: Secondary | ICD-10-CM

## 2012-09-26 DIAGNOSIS — J4489 Other specified chronic obstructive pulmonary disease: Secondary | ICD-10-CM

## 2012-09-26 DIAGNOSIS — J449 Chronic obstructive pulmonary disease, unspecified: Secondary | ICD-10-CM

## 2012-09-26 DIAGNOSIS — E1159 Type 2 diabetes mellitus with other circulatory complications: Secondary | ICD-10-CM

## 2012-09-26 LAB — BASIC METABOLIC PANEL
BUN: 34 mg/dL — ABNORMAL HIGH (ref 6–23)
Chloride: 108 mEq/L (ref 96–112)
Creatinine, Ser: 2.5 mg/dL — ABNORMAL HIGH (ref 0.4–1.5)
GFR: 27.42 mL/min — ABNORMAL LOW (ref >60.00)
Potassium: 4.5 mEq/L (ref 3.5–5.1)

## 2012-09-26 LAB — HEMOGLOBIN A1C: Hgb A1c MFr Bld: 6.3 % (ref 4.6–6.5)

## 2012-09-28 ENCOUNTER — Other Ambulatory Visit: Payer: Medicare Other

## 2012-09-28 ENCOUNTER — Ambulatory Visit: Payer: Medicare Other | Admitting: Vascular Surgery

## 2012-10-06 ENCOUNTER — Telehealth: Payer: Self-pay | Admitting: Internal Medicine

## 2012-10-06 NOTE — Telephone Encounter (Signed)
Received 3 pages from [facility] 10/06/12/sd

## 2012-10-07 ENCOUNTER — Ambulatory Visit (INDEPENDENT_AMBULATORY_CARE_PROVIDER_SITE_OTHER): Payer: Medicare Other | Admitting: Internal Medicine

## 2012-10-07 ENCOUNTER — Encounter: Payer: Self-pay | Admitting: Internal Medicine

## 2012-10-07 VITALS — BP 110/64 | HR 70 | Temp 97.4°F | Resp 10 | Wt 178.0 lb

## 2012-10-07 DIAGNOSIS — E1159 Type 2 diabetes mellitus with other circulatory complications: Secondary | ICD-10-CM

## 2012-10-07 DIAGNOSIS — L570 Actinic keratosis: Secondary | ICD-10-CM

## 2012-10-07 DIAGNOSIS — F17200 Nicotine dependence, unspecified, uncomplicated: Secondary | ICD-10-CM

## 2012-10-07 DIAGNOSIS — E1151 Type 2 diabetes mellitus with diabetic peripheral angiopathy without gangrene: Secondary | ICD-10-CM

## 2012-10-07 DIAGNOSIS — I129 Hypertensive chronic kidney disease with stage 1 through stage 4 chronic kidney disease, or unspecified chronic kidney disease: Secondary | ICD-10-CM

## 2012-10-07 DIAGNOSIS — I739 Peripheral vascular disease, unspecified: Secondary | ICD-10-CM

## 2012-10-07 DIAGNOSIS — K219 Gastro-esophageal reflux disease without esophagitis: Secondary | ICD-10-CM

## 2012-10-07 DIAGNOSIS — I798 Other disorders of arteries, arterioles and capillaries in diseases classified elsewhere: Secondary | ICD-10-CM

## 2012-10-07 DIAGNOSIS — I2510 Atherosclerotic heart disease of native coronary artery without angina pectoris: Secondary | ICD-10-CM

## 2012-10-07 DIAGNOSIS — Z23 Encounter for immunization: Secondary | ICD-10-CM

## 2012-10-07 DIAGNOSIS — E785 Hyperlipidemia, unspecified: Secondary | ICD-10-CM

## 2012-10-07 DIAGNOSIS — E875 Hyperkalemia: Secondary | ICD-10-CM

## 2012-10-07 DIAGNOSIS — K2270 Barrett's esophagus without dysplasia: Secondary | ICD-10-CM

## 2012-10-07 NOTE — Assessment & Plan Note (Signed)
Continue with current prescription therapy as reflected on the Med list.  

## 2012-10-07 NOTE — Addendum Note (Signed)
Addended by: Merrilyn Puma on: 10/07/2012 01:46 PM   Modules accepted: Orders

## 2012-10-07 NOTE — Assessment & Plan Note (Signed)
Continue with current prescription therapy as reflected on the Med list/ ablations

## 2012-10-07 NOTE — Assessment & Plan Note (Signed)
1/2 ppd now - discussed

## 2012-10-07 NOTE — Patient Instructions (Addendum)
Goal sugars are 70-130 If you have low sugars - take Glimepiride 1/2 tab a day    Postprocedure instructions :     Keep the wounds clean. You can wash them with liquid soap and water. Pat dry with gauze or a Kleenex tissue  Before applying antibiotic ointment and a Band-Aid.   You need to report immediately  if  any signs of infection develop.

## 2012-10-07 NOTE — Assessment & Plan Note (Addendum)
Continue with current prescription therapy as reflected on the Med list.  Goal sugars are 70-130 If you have low sugars - take Glimepiride 1/2 tab a day

## 2012-10-07 NOTE — Assessment & Plan Note (Signed)
See procedure 

## 2012-10-07 NOTE — Assessment & Plan Note (Signed)
BP has improved Continue with current prescription therapy as reflected on the Med list.

## 2012-10-07 NOTE — Assessment & Plan Note (Signed)
BMET 

## 2012-10-07 NOTE — Progress Notes (Signed)
Subjective:    Patient ID: [name], male    DOB: 1941/04/07, 71 y.o.   MRN: [id number]  HPI  F/u on elev BP  The patient presents for a follow-up of  chronic hypertension, chronic dyslipidemia, type 2 diabetes, COPD, Barrett's, CAD controlled with medicines. He had Barrett's ablation x4, one more is due    Review of Systems  Constitutional: Negative for appetite change, fatigue and unexpected weight change.  HENT: Negative for nosebleeds, congestion, sore throat, sneezing, trouble swallowing and neck pain.   Eyes: Negative for itching and visual disturbance.  Respiratory: Negative for cough.   Cardiovascular: Negative for chest pain, palpitations and leg swelling.  Gastrointestinal: Negative for nausea, diarrhea, blood in stool and abdominal distention.  Genitourinary: Negative for frequency and hematuria.  Musculoskeletal: Negative for back pain, joint swelling and gait problem.  Skin: Negative for rash.  Neurological: Negative for dizziness, tremors, speech difficulty and weakness.  Psychiatric/Behavioral: Negative for sleep disturbance, dysphoric mood and agitation. The patient is not nervous/anxious.    Wt Readings from Last 3 Encounters:  10/07/12 178 lb 0.6 oz (80.758 kg)  09/19/12 180 lb (81.647 kg)  09/05/12 178 lb (80.74 kg)   BP Readings from Last 3 Encounters:  10/07/12 110/64  09/19/12 148/80  09/05/12 118/77        Objective:   Physical Exam  Constitutional: He is oriented to person, place, and time. He appears well-developed.  HENT:  Mouth/Throat: Oropharynx is clear and moist.  Eyes: Conjunctivae normal are normal. Pupils are equal, round, and reactive to light.  Neck: Normal range of motion. No JVD present. No thyromegaly present.  Cardiovascular: Normal rate, regular rhythm, normal heart sounds and intact distal pulses.  Exam reveals no gallop and no friction rub.   No murmur heard. Pulmonary/Chest: Effort normal and breath sounds normal. No  respiratory distress. He has no wheezes. He has no rales. He exhibits no tenderness.  Abdominal: Soft. Bowel sounds are normal. He exhibits no distension and no mass. There is no tenderness. There is no rebound and no guarding.  Musculoskeletal: Normal range of motion. He exhibits no edema and no tenderness.  Lymphadenopathy:    He has no cervical adenopathy.  Neurological: He is alert and oriented to person, place, and time. He has normal reflexes. No cranial nerve deficit. He exhibits normal muscle tone. Coordination normal.  Skin: Skin is warm and dry. No rash noted.  Psychiatric: He has a normal mood and affect. His behavior is normal. Judgment and thought content normal.  AKs, SKs  Lab Results  Component Value Date   WBC 10.4 08/12/2012   HGB 10.0* 08/12/2012   HCT 29.9* 08/12/2012   PLT 232 08/12/2012   GLUCOSE 161* 09/26/2012   CHOL 154 08/11/2012   TRIG 326.0* 08/11/2012   HDL 31.40* 08/11/2012   LDLDIRECT 80.2 08/11/2012   LDLCALC 90 02/10/2011   ALT 12 08/12/2012   AST 19 08/12/2012   NA 138 09/26/2012   K 4.5 09/26/2012   CL 108 09/26/2012   CREATININE 2.5* 09/26/2012   BUN 34* 09/26/2012   CO2 20 09/26/2012   TSH 1.23 04/17/2010   PSA 0.99 04/17/2010   INR 1.0 01/21/2009   HGBA1C 6.3 09/26/2012     Procedure Note :     Procedure : Cryosurgery   Indication:    Actinic keratosis(es)   Risks including unsuccessful procedure , bleeding, infection, bruising, scar, a need for a repeat  procedure and others were explained to  the patient in detail as well as the benefits. Informed consent was obtained verbally.   8  lesion(s)  on  Scalp/face  was/were treated with liquid nitrogen on a Q-tip in a usual fasion . Band-Aid was applied and antibiotic ointment was given for a later use.   Tolerated well. Complications none.   Postprocedure instructions :     Keep the wounds clean. You can wash them with liquid soap and water. Pat dry with gauze or a Kleenex tissue  Before  applying antibiotic ointment and a Band-Aid.   You need to report immediately  if  any signs of infection develop.         Assessment & Plan:

## 2012-10-18 ENCOUNTER — Encounter: Payer: Self-pay | Admitting: Vascular Surgery

## 2012-10-19 ENCOUNTER — Encounter: Payer: Self-pay | Admitting: Vascular Surgery

## 2012-10-19 ENCOUNTER — Ambulatory Visit (INDEPENDENT_AMBULATORY_CARE_PROVIDER_SITE_OTHER): Payer: Medicare Other | Admitting: Vascular Surgery

## 2012-10-19 ENCOUNTER — Ambulatory Visit (INDEPENDENT_AMBULATORY_CARE_PROVIDER_SITE_OTHER): Payer: Medicare Other | Admitting: *Deleted

## 2012-10-19 VITALS — BP 157/88 | HR 77 | Resp 16 | Ht 69.0 in | Wt 176.2 lb

## 2012-10-19 DIAGNOSIS — I714 Abdominal aortic aneurysm, without rupture: Secondary | ICD-10-CM

## 2012-10-19 DIAGNOSIS — Z48812 Encounter for surgical aftercare following surgery on the circulatory system: Secondary | ICD-10-CM

## 2012-10-19 NOTE — Assessment & Plan Note (Signed)
This patient underwent endovascular aneurysm repair by Dr. Liliane Bade in March of 2010. His aneurysm is very small and is stable in size at 3.7 cm compared to 3.6 cm in 2011. I think we can stretch his follow up out to 18 months. I've ordered a duplex of his aneurysm in 18 months and we will see him back at that time. He does continue to smoke one pack per day of cigarettes we have discussed the importance of tobacco cessation. I have given him the number for cones freed tobacco cessation program. He knows to call sooner if he has problems.

## 2012-10-19 NOTE — Progress Notes (Signed)
[facility] [place]  Patient name: [name] MRN: [id number] DOB: [birth year] Sex: male  REASON FOR VISIT: follow up of abdominal aortic aneurysm  HPI: [name] is a 71 y.o. male who underwent endovascular aneurysm repair by Dr. Liliane Bade in March of 2010. The infrarenal aorta measures 4.8 cm in maximum diameter and the right common iliac artery measured 3 cm. He comes in for a routine follow up duplex. He denies any history of abdominal pain or back pain.   REVIEW OF SYSTEMS: Arly.Keller ] denotes positive finding; [  ] denotes negative finding  CARDIOVASCULAR:  [ ]  chest pain   [ ]  dyspnea on exertion    CONSTITUTIONAL:  [ ]  fever   [ ]  chills  PHYSICAL EXAM: Filed Vitals:   10/19/12 0933  BP: 157/88  Pulse: 77  Resp: 16  Height: 5\' 9"  (1.753 m)  Weight: 176 lb 3.2 oz (79.924 kg)  SpO2: 99%   Body mass index is 26.02 kg/(m^2). GENERAL: The patient is a well-nourished male, in no acute distress. The vital signs are documented above. CARDIOVASCULAR: There is a regular rate and rhythm  PULMONARY: There is good air exchange bilaterally without wheezing or rales. Abdomen is soft and nontender. I cannot palpate his aneurysm. Both feet are warm and well-perfused. He has no significant lower extremity swelling.  I have independently interpreted his duplex of his aneurysm which shows that the maximum diameter is 3.7 cm. This been no change in size compared to 3.6 cm in November of 2011. Right common iliac artery measures 1.4 cm in maximum diameter and left common iliac artery measures 1.4 cm in maximum diameter.  MEDICAL ISSUES:  AAA This patient underwent endovascular aneurysm repair by Dr. Liliane Bade in March of 2010. His aneurysm is very small and is stable in size at 3.7 cm compared to 3.6 cm in 2011. I think we can stretch his follow up out to 18 months. I've ordered a duplex of his aneurysm in 18 months and we will see him back at that time. He does continue  to smoke one pack per day of cigarettes we have discussed the importance of tobacco cessation. I have given him the number for cones freed tobacco cessation program. He knows to call sooner if he has problems.   Matthew Pais S [facility] [place] Beeper: [phone]

## 2012-10-20 NOTE — Addendum Note (Signed)
Addended by: Sharee Pimple on: 10/20/2012 09:32 AM   Modules accepted: Orders

## 2012-10-25 ENCOUNTER — Ambulatory Visit (INDEPENDENT_AMBULATORY_CARE_PROVIDER_SITE_OTHER): Payer: Medicare Other | Admitting: Internal Medicine

## 2012-10-25 ENCOUNTER — Encounter: Payer: Self-pay | Admitting: Internal Medicine

## 2012-10-25 VITALS — BP 136/88 | HR 79 | Temp 97.5°F | Resp 16 | Wt 174.2 lb

## 2012-10-25 DIAGNOSIS — N259 Disorder resulting from impaired renal tubular function, unspecified: Secondary | ICD-10-CM

## 2012-10-25 DIAGNOSIS — K52832 Lymphocytic colitis: Secondary | ICD-10-CM | POA: Insufficient documentation

## 2012-10-25 DIAGNOSIS — R110 Nausea: Secondary | ICD-10-CM | POA: Insufficient documentation

## 2012-10-25 DIAGNOSIS — R197 Diarrhea, unspecified: Secondary | ICD-10-CM

## 2012-10-25 MED ORDER — DIPHENOXYLATE-ATROPINE 2.5-0.025 MG PO TABS: 1.0000 | ORAL_TABLET | Freq: Four times a day (QID) | ORAL | Status: DC | PRN

## 2012-10-25 MED ORDER — PROMETHAZINE HCL 12.5 MG PO TABS: 12.5000 mg | ORAL_TABLET | Freq: Three times a day (TID) | ORAL | Status: DC | PRN

## 2012-10-25 NOTE — Assessment & Plan Note (Addendum)
Lomotil prn 12/13 acute - poss gastroenteritis

## 2012-10-25 NOTE — Assessment & Plan Note (Signed)
Hold Triamt/HCTZ x 2-3 days

## 2012-10-25 NOTE — Assessment & Plan Note (Signed)
Hold Triamt/HCTZ x 2-3 days Promethazine prn

## 2012-10-25 NOTE — Progress Notes (Signed)
   Subjective:     HPI  C/o diarrhea x 3 d (4-5 per day) - watery stools, mild cramps. C/o nausea. No vomiting...  The patient presents for a follow-up of  chronic hypertension, chronic dyslipidemia, type 2 diabetes, COPD, Barrett's, CAD controlled with medicines. He had Barrett's ablation x4, one more is due    Review of Systems  Constitutional: Negative for appetite change, fatigue and unexpected weight change.  HENT: Negative for nosebleeds, congestion, sore throat, sneezing, trouble swallowing and neck pain.   Eyes: Negative for itching and visual disturbance.  Respiratory: Negative for cough.   Cardiovascular: Negative for chest pain, palpitations and leg swelling.  Gastrointestinal: Negative for nausea, diarrhea, blood in stool and abdominal distention.  Genitourinary: Negative for frequency and hematuria.  Musculoskeletal: Negative for back pain, joint swelling and gait problem.  Skin: Negative for rash.  Neurological: Negative for dizziness, tremors, speech difficulty and weakness.  Psychiatric/Behavioral: Negative for sleep disturbance, dysphoric mood and agitation. The patient is not nervous/anxious.    Wt Readings from Last 3 Encounters:  10/25/12 174 lb 4 oz (79.039 kg)  10/19/12 176 lb 3.2 oz (79.924 kg)  10/07/12 178 lb 0.6 oz (80.758 kg)   BP Readings from Last 3 Encounters:  10/25/12 136/88  10/19/12 157/88  10/07/12 110/64        Objective:   Physical Exam  Constitutional: He is oriented to person, place, and time. He appears well-developed.  HENT:  Head: Normocephalic and atraumatic.  Mouth/Throat: No oropharyngeal exudate.  Eyes: Conjunctivae normal are normal. Pupils are equal, round, and reactive to light.  Neck: Normal range of motion. No JVD present. No thyromegaly present.  Cardiovascular: Normal rate, regular rhythm, normal heart sounds and intact distal pulses.  Exam reveals no gallop and no friction rub.   No murmur heard. Pulmonary/Chest:  Effort normal and breath sounds normal. No respiratory distress. He has no wheezes. He has no rales. He exhibits no tenderness.  Abdominal: Soft. Bowel sounds are normal. He exhibits no distension and no mass. There is no tenderness. There is no rebound and no guarding.  Musculoskeletal: Normal range of motion. He exhibits no edema and no tenderness.  Lymphadenopathy:    He has no cervical adenopathy.  Neurological: He is alert and oriented to person, place, and time. He has normal reflexes. No cranial nerve deficit. He exhibits normal muscle tone. Coordination normal.  Skin: Skin is warm and dry. No rash noted.  Psychiatric: He has a normal mood and affect. His behavior is normal. Judgment and thought content normal.   Dry tongue  Lab Results  Component Value Date   WBC 10.4 08/12/2012   HGB 10.0* 08/12/2012   HCT 29.9* 08/12/2012   PLT 232 08/12/2012   GLUCOSE 161* 09/26/2012   CHOL 154 08/11/2012   TRIG 326.0* 08/11/2012   HDL 31.40* 08/11/2012   LDLDIRECT 80.2 08/11/2012   LDLCALC 90 02/10/2011   ALT 12 08/12/2012   AST 19 08/12/2012   NA 138 09/26/2012   K 4.5 09/26/2012   CL 108 09/26/2012   CREATININE 2.5* 09/26/2012   BUN 34* 09/26/2012   CO2 20 09/26/2012   TSH 1.23 04/17/2010   PSA 0.99 04/17/2010   INR 1.0 01/21/2009   HGBA1C 6.3 09/26/2012             Assessment & Plan:

## 2012-10-25 NOTE — Patient Instructions (Addendum)
Hold Triamt/HCTZ x 2-3 days Drink more fluids

## 2012-11-03 ENCOUNTER — Telehealth: Payer: Self-pay | Admitting: *Deleted

## 2012-11-03 NOTE — Telephone Encounter (Signed)
PA for generic Lomotil is approved from 11/03/2012-11/03/2013. Pharmacy informed.

## 2012-11-08 ENCOUNTER — Other Ambulatory Visit: Payer: Self-pay | Admitting: Internal Medicine

## 2012-11-16 ENCOUNTER — Telehealth: Payer: Self-pay | Admitting: *Deleted

## 2012-11-16 NOTE — Telephone Encounter (Signed)
I called pt re: PA for gen Lomotil. He states he was able to get the med last month and his symptoms are resolved. He states he doesn't need any more at this time. Informed pharmacy of this. Disregarding PA request for now.

## 2012-11-20 ENCOUNTER — Other Ambulatory Visit: Payer: Self-pay | Admitting: Internal Medicine

## 2012-11-21 NOTE — Telephone Encounter (Signed)
Ok to RF? 

## 2012-12-01 ENCOUNTER — Other Ambulatory Visit: Payer: Self-pay | Admitting: Internal Medicine

## 2012-12-02 ENCOUNTER — Other Ambulatory Visit: Payer: Self-pay | Admitting: Internal Medicine

## 2012-12-06 ENCOUNTER — Other Ambulatory Visit: Payer: Self-pay | Admitting: Internal Medicine

## 2012-12-07 ENCOUNTER — Other Ambulatory Visit: Payer: Self-pay | Admitting: Internal Medicine

## 2012-12-17 ENCOUNTER — Other Ambulatory Visit: Payer: Self-pay | Admitting: Internal Medicine

## 2012-12-21 ENCOUNTER — Telehealth: Payer: Self-pay | Admitting: Internal Medicine

## 2012-12-21 NOTE — Telephone Encounter (Signed)
Patient Information:  Caller Name: Romey  Phone: [phone]  Patient: [name], [name]  Gender: Male  DOB: [birth year]  Age: 72 Years  PCP: Plotnikov, Alex (Adults only)  Office Follow Up:  Does the office need to follow up with this patient?: Yes  Instructions For The Office: OFFICE, THE DISPO WAS CALLBACK BY PCP TODAY.  RN WENT AHEAD AND SCHEDULED APPT FOR PT FOR 12/22/12 AT 1115. PLEASE SEE IF PROVIDER WANTS TO SPEAK WITH PT TODAY OR WILL SEE PT TOMORROW  RN Note:  RN asked about blood sugars but he states he never checks it; pt reports he has 4-5 episodes of diarrhea in the past 24 hours; Pt was at [facility] and checked his BP 130/78 and pulse was 82  Symptoms  Reason For Call & Symptoms: diarrhea x 1 week  Reviewed Health History In EMR: Yes  Reviewed Medications In EMR: Yes  Reviewed Allergies In EMR: Yes  Reviewed Surgeries / Procedures: Yes  Date of Onset of Symptoms: 12/14/2012  Guideline(s) Used:  Diarrhea  Disposition Per Guideline:   Callback by PCP Today  Reason For Disposition Reached:   Age > 70 years  Advice Given:  Fluids:  Drink more fluids, at least 8-10 glasses (8 oz or 240 ml) daily.  Supplement this with saltine crackers or soups to make certain that you are getting sufficient fluid and salt to meet your body's needs.  Diarrhea Medication  - Imodium AD:   Helps reduce diarrhea.  Adult dosage: 4 mg (2 capsules or 4 teaspoons or 20 ml) is the recommended first dose. You may take an additional 2 mg (1 capsule or 2 teaspoons or 10 ml) after each loose BM.  Call Back If:  Signs of dehydration occur (e.g., no urine for more than 12 hours, very dry mouth, lightheaded, etc.)  Diarrhea lasts over 7 days  You become worse.

## 2012-12-22 ENCOUNTER — Ambulatory Visit: Payer: Self-pay | Admitting: Internal Medicine

## 2012-12-22 NOTE — Telephone Encounter (Signed)
Agree w/OV Thx 

## 2013-01-03 ENCOUNTER — Other Ambulatory Visit: Payer: Self-pay | Admitting: Internal Medicine

## 2013-01-23 ENCOUNTER — Ambulatory Visit (INDEPENDENT_AMBULATORY_CARE_PROVIDER_SITE_OTHER): Payer: Medicare Other

## 2013-01-23 DIAGNOSIS — K219 Gastro-esophageal reflux disease without esophagitis: Secondary | ICD-10-CM

## 2013-01-23 DIAGNOSIS — E1151 Type 2 diabetes mellitus with diabetic peripheral angiopathy without gangrene: Secondary | ICD-10-CM

## 2013-01-23 DIAGNOSIS — I739 Peripheral vascular disease, unspecified: Secondary | ICD-10-CM

## 2013-01-23 DIAGNOSIS — E785 Hyperlipidemia, unspecified: Secondary | ICD-10-CM

## 2013-01-23 DIAGNOSIS — E1159 Type 2 diabetes mellitus with other circulatory complications: Secondary | ICD-10-CM

## 2013-01-23 DIAGNOSIS — I2510 Atherosclerotic heart disease of native coronary artery without angina pectoris: Secondary | ICD-10-CM

## 2013-01-23 DIAGNOSIS — A09 Infectious gastroenteritis and colitis, unspecified: Secondary | ICD-10-CM

## 2013-01-23 DIAGNOSIS — I129 Hypertensive chronic kidney disease with stage 1 through stage 4 chronic kidney disease, or unspecified chronic kidney disease: Secondary | ICD-10-CM

## 2013-01-23 DIAGNOSIS — F17200 Nicotine dependence, unspecified, uncomplicated: Secondary | ICD-10-CM

## 2013-01-23 DIAGNOSIS — I798 Other disorders of arteries, arterioles and capillaries in diseases classified elsewhere: Secondary | ICD-10-CM

## 2013-01-23 DIAGNOSIS — K2270 Barrett's esophagus without dysplasia: Secondary | ICD-10-CM

## 2013-01-23 DIAGNOSIS — E875 Hyperkalemia: Secondary | ICD-10-CM

## 2013-01-23 LAB — BASIC METABOLIC PANEL
BUN: 29 mg/dL — ABNORMAL HIGH (ref 6–23)
Calcium: 8.6 mg/dL (ref 8.4–10.5)
Creatinine, Ser: 2.7 mg/dL — ABNORMAL HIGH (ref 0.4–1.5)
GFR: 24.42 mL/min — ABNORMAL LOW (ref >60.00)
Glucose, Bld: 131 mg/dL — ABNORMAL HIGH (ref 70–99)
Sodium: 140 mEq/L (ref 135–145)

## 2013-01-24 LAB — CLOSTRIDIUM DIFFICILE EIA: CDIFTX: NEGATIVE

## 2013-01-25 LAB — OVA AND PARASITE EXAMINATION: OP: NONE SEEN

## 2013-01-29 ENCOUNTER — Other Ambulatory Visit: Payer: Self-pay | Admitting: Internal Medicine

## 2013-01-30 ENCOUNTER — Other Ambulatory Visit: Payer: Self-pay | Admitting: Internal Medicine

## 2013-01-31 ENCOUNTER — Other Ambulatory Visit: Payer: Self-pay | Admitting: Internal Medicine

## 2013-02-02 ENCOUNTER — Ambulatory Visit (INDEPENDENT_AMBULATORY_CARE_PROVIDER_SITE_OTHER): Payer: Medicare Other | Admitting: Internal Medicine

## 2013-02-02 ENCOUNTER — Encounter: Payer: Self-pay | Admitting: Internal Medicine

## 2013-02-02 VITALS — BP 128/80 | HR 80 | Temp 97.3°F | Resp 16 | Wt 167.0 lb

## 2013-02-02 DIAGNOSIS — K2270 Barrett's esophagus without dysplasia: Secondary | ICD-10-CM

## 2013-02-02 DIAGNOSIS — E785 Hyperlipidemia, unspecified: Secondary | ICD-10-CM

## 2013-02-02 DIAGNOSIS — I798 Other disorders of arteries, arterioles and capillaries in diseases classified elsewhere: Secondary | ICD-10-CM

## 2013-02-02 DIAGNOSIS — E1159 Type 2 diabetes mellitus with other circulatory complications: Secondary | ICD-10-CM

## 2013-02-02 DIAGNOSIS — L570 Actinic keratosis: Secondary | ICD-10-CM

## 2013-02-02 DIAGNOSIS — E1151 Type 2 diabetes mellitus with diabetic peripheral angiopathy without gangrene: Secondary | ICD-10-CM

## 2013-02-02 DIAGNOSIS — R197 Diarrhea, unspecified: Secondary | ICD-10-CM

## 2013-02-02 DIAGNOSIS — F329 Major depressive disorder, single episode, unspecified: Secondary | ICD-10-CM

## 2013-02-02 MED ORDER — DIPHENOXYLATE-ATROPINE 2.5-0.025 MG PO TABS: 1.0000 | ORAL_TABLET | Freq: Four times a day (QID) | ORAL | Status: DC | PRN

## 2013-02-02 MED ORDER — ALPRAZOLAM 0.5 MG PO TABS: 0.5000 mg | ORAL_TABLET | Freq: Two times a day (BID) | ORAL | Status: DC | PRN

## 2013-02-02 MED ORDER — TEMAZEPAM 15 MG PO CAPS: 15.0000 mg | ORAL_CAPSULE | Freq: Every evening | ORAL | Status: DC | PRN

## 2013-02-02 NOTE — Assessment & Plan Note (Signed)
Continue with current prescription therapy as reflected on the Med list.  

## 2013-02-02 NOTE — Progress Notes (Signed)
Patient ID: [name], male   DOB: 03-14-1941, 72 y.o.   MRN: [id number]   Subjective:     HPI  C/o diarrhea x 3 d sporadic off and on - watery stools, mild cramps. No nausea. No vomiting...  The patient presents for a follow-up of  chronic hypertension, chronic dyslipidemia, type 2 diabetes, COPD, Barrett's, CAD controlled with medicines. He had Barrett's ablation x4, one more is due    Review of Systems  Constitutional: Positive for unexpected weight change. Negative for appetite change and fatigue.  HENT: Negative for nosebleeds, congestion, sore throat, sneezing, trouble swallowing and neck pain.   Eyes: Negative for itching and visual disturbance.  Respiratory: Negative for cough.   Cardiovascular: Negative for chest pain, palpitations and leg swelling.  Gastrointestinal: Positive for diarrhea. Negative for nausea, vomiting, constipation, blood in stool, abdominal distention and rectal pain.  Genitourinary: Negative for frequency and hematuria.  Musculoskeletal: Negative for back pain, joint swelling and gait problem.  Skin: Negative for rash.  Neurological: Negative for dizziness, tremors, speech difficulty and weakness.  Psychiatric/Behavioral: Negative for sleep disturbance, dysphoric mood and agitation. The patient is not nervous/anxious.    Wt Readings from Last 3 Encounters:  02/02/13 167 lb (75.751 kg)  10/25/12 174 lb 4 oz (79.039 kg)  10/19/12 176 lb 3.2 oz (79.924 kg)   BP Readings from Last 3 Encounters:  02/02/13 128/80  10/25/12 136/88  10/19/12 157/88        Objective:   Physical Exam  Constitutional: He is oriented to person, place, and time. He appears well-developed.  HENT:  Head: Normocephalic and atraumatic.  Mouth/Throat: No oropharyngeal exudate.  Eyes: Conjunctivae are normal. Pupils are equal, round, and reactive to light.  Neck: Normal range of motion. No JVD present. No thyromegaly present.  Cardiovascular: Normal rate, regular rhythm,  normal heart sounds and intact distal pulses.  Exam reveals no gallop and no friction rub.   No murmur heard. Pulmonary/Chest: Effort normal and breath sounds normal. No respiratory distress. He has no wheezes. He has no rales. He exhibits no tenderness.  Abdominal: Soft. Bowel sounds are normal. He exhibits no distension and no mass. There is no tenderness. There is no rebound and no guarding.  Musculoskeletal: Normal range of motion. He exhibits no edema and no tenderness.  Lymphadenopathy:    He has no cervical adenopathy.  Neurological: He is alert and oriented to person, place, and time. He has normal reflexes. No cranial nerve deficit. He exhibits normal muscle tone. Coordination normal.  Skin: Skin is warm and dry. No rash noted.  Psychiatric: He has a normal mood and affect. His behavior is normal. Judgment and thought content normal.  AKs   Lab Results  Component Value Date   WBC 10.4 08/12/2012   HGB 10.0* 08/12/2012   HCT 29.9* 08/12/2012   PLT 232 08/12/2012   GLUCOSE 131* 01/23/2013   CHOL 154 08/11/2012   TRIG 326.0* 08/11/2012   HDL 31.40* 08/11/2012   LDLDIRECT 80.2 08/11/2012   LDLCALC 90 02/10/2011   ALT 12 08/12/2012   AST 19 08/12/2012   NA 140 01/23/2013   K 4.3 01/23/2013   CL 111 01/23/2013   CREATININE 2.7* 01/23/2013   BUN 29* 01/23/2013   CO2 18* 01/23/2013   TSH 1.23 04/17/2010   PSA 0.99 04/17/2010   INR 1.0 01/21/2009   HGBA1C 5.7 01/23/2013        Procedure Note :     Procedure : Cryosurgery  Indication: Actinic keratosis(es)   Risks including unsuccessful procedure , bleeding, infection, bruising, scar, a need for a repeat  procedure and others were explained to the patient in detail as well as the benefits. Informed consent was obtained verbally.   6  lesion(s)  on  face  was/were treated with liquid nitrogen on a Q-tip in a usual fasion . Band-Aid was applied and antibiotic ointment was given for a later use.   Tolerated well. Complications  none.   Postprocedure instructions :     Keep the wounds clean. You can wash them with liquid soap and water. Pat dry with gauze or a Kleenex tissue  Before applying antibiotic ointment and a Band-Aid.   You need to report immediately  if  any signs of infection develop.         Assessment & Plan:

## 2013-02-02 NOTE — Assessment & Plan Note (Signed)
Ablation at [facility] with current prescription therapy as reflected on the Med list.

## 2013-02-02 NOTE — Patient Instructions (Addendum)
   Postprocedure instructions :     Keep the wounds clean. You can wash them with liquid soap and water. Pat dry with gauze or a Kleenex tissue  Before applying antibiotic ointment and a Band-Aid.   You need to report immediately  if  any signs of infection develop.    

## 2013-02-02 NOTE — Assessment & Plan Note (Signed)
recurrent ?etiology Lomotil prn GI consult if not better

## 2013-02-02 NOTE — Assessment & Plan Note (Signed)
cryo 

## 2013-02-10 ENCOUNTER — Other Ambulatory Visit: Payer: Self-pay | Admitting: *Deleted

## 2013-02-10 MED ORDER — PROMETHAZINE HCL 12.5 MG PO TABS: 12.5000 mg | ORAL_TABLET | Freq: Three times a day (TID) | ORAL | Status: DC | PRN

## 2013-02-10 NOTE — Telephone Encounter (Signed)
Done

## 2013-02-11 ENCOUNTER — Other Ambulatory Visit: Payer: Self-pay | Admitting: Internal Medicine

## 2013-02-26 ENCOUNTER — Telehealth: Payer: Self-pay | Admitting: Internal Medicine

## 2013-02-26 NOTE — Telephone Encounter (Signed)
Patient called a short while ago wondering if he could get an office appointment this week to evaluated 3 weeks of diarrhea.Saw Dr [name] and was prescribed lomotil - which is not helping. No there issues such as fever,pain,or bleeding. Basically, just wanted an appointment. I told him to call in am during regular business hours and that I'll forward this message to Dr Marvell Fuller nurse.

## 2013-02-27 ENCOUNTER — Other Ambulatory Visit: Payer: Self-pay

## 2013-02-27 ENCOUNTER — Encounter (HOSPITAL_COMMUNITY): Payer: Self-pay | Admitting: Emergency Medicine

## 2013-02-27 ENCOUNTER — Ambulatory Visit (INDEPENDENT_AMBULATORY_CARE_PROVIDER_SITE_OTHER): Payer: Medicare Other | Admitting: Gastroenterology

## 2013-02-27 ENCOUNTER — Telehealth: Payer: Self-pay | Admitting: Internal Medicine

## 2013-02-27 ENCOUNTER — Telehealth: Payer: Self-pay | Admitting: *Deleted

## 2013-02-27 ENCOUNTER — Other Ambulatory Visit (INDEPENDENT_AMBULATORY_CARE_PROVIDER_SITE_OTHER): Payer: Medicare Other

## 2013-02-27 ENCOUNTER — Emergency Department (HOSPITAL_COMMUNITY)
Admission: EM | Admit: 2013-02-27 | Discharge: 2013-02-27 | Disposition: A | Discharge status: Home / Self Care | Payer: Medicare Other | Attending: Emergency Medicine | Admitting: Emergency Medicine

## 2013-02-27 ENCOUNTER — Encounter: Payer: Self-pay | Admitting: Gastroenterology

## 2013-02-27 VITALS — BP 112/64 | HR 58 | Ht 69.0 in | Wt 161.0 lb

## 2013-02-27 DIAGNOSIS — R197 Diarrhea, unspecified: Secondary | ICD-10-CM | POA: Insufficient documentation

## 2013-02-27 DIAGNOSIS — K529 Noninfective gastroenteritis and colitis, unspecified: Secondary | ICD-10-CM

## 2013-02-27 DIAGNOSIS — N189 Chronic kidney disease, unspecified: Secondary | ICD-10-CM | POA: Insufficient documentation

## 2013-02-27 DIAGNOSIS — F17200 Nicotine dependence, unspecified, uncomplicated: Secondary | ICD-10-CM | POA: Insufficient documentation

## 2013-02-27 DIAGNOSIS — F3289 Other specified depressive episodes: Secondary | ICD-10-CM | POA: Insufficient documentation

## 2013-02-27 DIAGNOSIS — J4489 Other specified chronic obstructive pulmonary disease: Secondary | ICD-10-CM | POA: Insufficient documentation

## 2013-02-27 DIAGNOSIS — Z7982 Long term (current) use of aspirin: Secondary | ICD-10-CM | POA: Insufficient documentation

## 2013-02-27 DIAGNOSIS — J449 Chronic obstructive pulmonary disease, unspecified: Secondary | ICD-10-CM | POA: Insufficient documentation

## 2013-02-27 DIAGNOSIS — F329 Major depressive disorder, single episode, unspecified: Secondary | ICD-10-CM | POA: Insufficient documentation

## 2013-02-27 DIAGNOSIS — N179 Acute kidney failure, unspecified: Secondary | ICD-10-CM

## 2013-02-27 DIAGNOSIS — D72829 Elevated white blood cell count, unspecified: Secondary | ICD-10-CM | POA: Insufficient documentation

## 2013-02-27 DIAGNOSIS — I2510 Atherosclerotic heart disease of native coronary artery without angina pectoris: Secondary | ICD-10-CM | POA: Insufficient documentation

## 2013-02-27 DIAGNOSIS — E119 Type 2 diabetes mellitus without complications: Secondary | ICD-10-CM | POA: Insufficient documentation

## 2013-02-27 DIAGNOSIS — Z880 Allergy status to penicillin: Secondary | ICD-10-CM | POA: Insufficient documentation

## 2013-02-27 DIAGNOSIS — F411 Generalized anxiety disorder: Secondary | ICD-10-CM | POA: Insufficient documentation

## 2013-02-27 DIAGNOSIS — Z8719 Personal history of other diseases of the digestive system: Secondary | ICD-10-CM | POA: Insufficient documentation

## 2013-02-27 DIAGNOSIS — I129 Hypertensive chronic kidney disease with stage 1 through stage 4 chronic kidney disease, or unspecified chronic kidney disease: Secondary | ICD-10-CM | POA: Insufficient documentation

## 2013-02-27 DIAGNOSIS — Z79899 Other long term (current) drug therapy: Secondary | ICD-10-CM | POA: Insufficient documentation

## 2013-02-27 DIAGNOSIS — Z8669 Personal history of other diseases of the nervous system and sense organs: Secondary | ICD-10-CM | POA: Insufficient documentation

## 2013-02-27 DIAGNOSIS — I252 Old myocardial infarction: Secondary | ICD-10-CM | POA: Insufficient documentation

## 2013-02-27 DIAGNOSIS — K219 Gastro-esophageal reflux disease without esophagitis: Secondary | ICD-10-CM | POA: Insufficient documentation

## 2013-02-27 DIAGNOSIS — E785 Hyperlipidemia, unspecified: Secondary | ICD-10-CM | POA: Insufficient documentation

## 2013-02-27 DIAGNOSIS — Z8679 Personal history of other diseases of the circulatory system: Secondary | ICD-10-CM | POA: Insufficient documentation

## 2013-02-27 LAB — COMPREHENSIVE METABOLIC PANEL
Albumin: 3.8 g/dL (ref 3.5–5.2)
Alkaline Phosphatase: 54 U/L (ref 39–117)
BUN: 44 mg/dL — ABNORMAL HIGH (ref 6–23)
CO2: 18 mEq/L — ABNORMAL LOW (ref 19–32)
Calcium: 9.3 mg/dL (ref 8.4–10.5)
GFR: 17.21 mL/min — ABNORMAL LOW (ref >60.00)
Glucose, Bld: 125 mg/dL — ABNORMAL HIGH (ref 70–99)
Potassium: 4 mEq/L (ref 3.5–5.1)
Sodium: 135 mEq/L (ref 135–145)
Total Protein: 8 g/dL (ref 6.0–8.3)

## 2013-02-27 LAB — URINALYSIS, ROUTINE W REFLEX MICROSCOPIC
Glucose, UA: NEGATIVE mg/dL
Hgb urine dipstick: NEGATIVE
Ketones, ur: NEGATIVE mg/dL
Leukocytes, UA: NEGATIVE
Nitrite: NEGATIVE
Protein, ur: NEGATIVE mg/dL
Specific Gravity, Urine: 1.026 (ref 1.005–1.030)
Urobilinogen, UA: 0.2 mg/dL (ref 0.0–1.0)
pH: 5 (ref 5.0–8.0)

## 2013-02-27 LAB — CBC
HCT: 31.1 % — ABNORMAL LOW (ref 39.0–52.0)
Hemoglobin: 10.1 g/dL — ABNORMAL LOW (ref 13.0–17.0)
MCH: 29.7 pg (ref 26.0–34.0)
MCHC: 32.5 g/dL (ref 30.0–36.0)
MCV: 91.5 fL (ref 78.0–100.0)
Platelets: 282 10*3/uL (ref 150–400)
RBC: 3.4 MIL/uL — ABNORMAL LOW (ref 4.22–5.81)
RDW: 13.5 % (ref 11.5–15.5)
WBC: 16.5 10*3/uL — ABNORMAL HIGH (ref 4.0–10.5)

## 2013-02-27 LAB — CBC WITH DIFFERENTIAL/PLATELET
Basophils Relative: 0.5 % (ref 0.0–3.0)
Eosinophils Relative: 0.6 % (ref 0.0–5.0)
Lymphocytes Relative: 7 % — ABNORMAL LOW (ref 12.0–46.0)
MCV: 92.1 fl (ref 78.0–100.0)
Monocytes Relative: 4.7 % (ref 3.0–12.0)
Neutrophils Relative %: 87.2 % — ABNORMAL HIGH (ref 43.0–77.0)
Platelets: 294 10*3/uL (ref 150.0–400.0)
RBC: 3.5 Mil/uL — ABNORMAL LOW (ref 4.22–5.81)
WBC: 18.2 10*3/uL (ref 4.5–10.5)

## 2013-02-27 LAB — BASIC METABOLIC PANEL
BUN: 46 mg/dL — ABNORMAL HIGH (ref 6–23)
CO2: 17 mEq/L — ABNORMAL LOW (ref 19–32)
Calcium: 9.5 mg/dL (ref 8.4–10.5)
Chloride: 98 mEq/L (ref 96–112)
Creatinine, Ser: 3.66 mg/dL — ABNORMAL HIGH (ref 0.50–1.35)
GFR calc Af Amer: 18 mL/min — ABNORMAL LOW (ref >90)
GFR calc non Af Amer: 15 mL/min — ABNORMAL LOW (ref >90)
Glucose, Bld: 133 mg/dL — ABNORMAL HIGH (ref 70–99)
Potassium: 3.9 mEq/L (ref 3.5–5.1)
Sodium: 133 mEq/L — ABNORMAL LOW (ref 135–145)

## 2013-02-27 LAB — IGA: IgA: 277 mg/dL (ref 68–378)

## 2013-02-27 LAB — TSH: TSH: 1.55 u[IU]/mL (ref 0.35–5.50)

## 2013-02-27 MED ORDER — CHOLESTYRAMINE 4 G PO PACK: 1.0000 | PACK | Freq: Every day | ORAL | Status: DC

## 2013-02-27 MED ORDER — SODIUM CHLORIDE 0.9 % IV BOLUS (SEPSIS)
1000.0000 mL | Freq: Once | INTRAVENOUS | Status: AC
  Administered 2013-02-27: 1000 mL via INTRAVENOUS

## 2013-02-27 MED ORDER — NA SULFATE-K SULFATE-MG SULF 17.5-3.13-1.6 GM/177ML PO SOLN: ORAL | Status: DC

## 2013-02-27 NOTE — Telephone Encounter (Signed)
Patient was in lab and c/o weakness. Patient was in lab chair drinking ginger ale. BP 138/80 and pulse 78. Blood sugar 178. Patient was pale, alert. He states he took his medication for diabetes and high blood pressure. He did not eat breakfast or lunch. His wife was present and verified this. Patient was asked the year and he stated 2089. Patient's wife states he sometimes gets confused when he is dehydrated. Patient and wife understand to force fluids. Patient got up into wheelchair with minimal assistance. He states his toe hurts. States this started this AM when he got up and that it is swollen. Patient taken to car. Spoke with Doug Sou, PA and she had received CBC result with elevated WBC. She wants patient to go to [facility] ED for evaluation. Called patient on cell and spoke to his wife. She is taking the patient to [facility] ED for evaluation.

## 2013-02-27 NOTE — Telephone Encounter (Signed)
Patient is scheduled to see Doug Sou, PA today at 2:15.  See phone note from Dr. Marina Goodell from last night

## 2013-02-27 NOTE — Patient Instructions (Addendum)
You have been scheduled for a colonoscopy at [facility]. Please follow written instructions given to you at your visit today.  Please use the prep kit we gave you today.If you use inhalers (even only as needed), please bring them with you on the day of your procedure. Your physician has requested that you go to www.startemmi.com and enter the access code given to you at your visit today. This web site gives a general overview about your procedure. However, you should still follow specific instructions given to you by our office regarding your preparation for the procedure.  Your physician has requested that you go to the basement for lab work before leaving today.  We have sent the following medications to your pharmacy for you to pick up at your convenience: Questran   Thank you for choosing me and [facility].  Doug Sou, PA-C

## 2013-02-27 NOTE — ED Notes (Signed)
Pt complains of weakness x 3 months with decrease in intake over the last week. Pt states "I just feel bad" Pt was sent from PCP office today with a high WBC

## 2013-02-27 NOTE — Progress Notes (Signed)
Agree with Ms. Rise Mu assessment and plans

## 2013-02-27 NOTE — Progress Notes (Signed)
02/27/2013 [name] [id number] 11/15/1940   History of Present Illness:  Patient is a 72 year old male who is a patient of Dr. Marvell Fuller.  He presents to our office today with complaints of diarrhea for the past 3-4 months.  Says that he has anywhere from 2-4 BM's a day and they are always loose or watery.  Sometimes he has incontinence.  Seems to be worsening.  He saw his PCP who ordered stool for Cdiff, enteric pathogens, WBC's, and O & P, which were all negative.  He denies abdominal pain and rectal bleeding.  Some nausea at times but no vomiting.  Does not have an appetite and is not eating much, but trying to drink quite a bit.  His PCP also gave him lomotil, but it is not helping.  Last colonoscopy was in 06/2005 with recommended repeat in 7-10 years.  Current Medications, Allergies, Past Medical History, Past Surgical History, Family History and Social History were reviewed in [employer] record.   Physical Exam: BP 112/64  Pulse 58  Ht 5\' 9"  (1.753 m)  Wt 161 lb (73.029 kg)  BMI 23.76 kg/m2 General: Elderly white male in no acute distress Head: Normocephalic and atraumatic Eyes:  sclerae anicteric, conjunctiva pink  Ears: Normal auditory acuity Lungs: Clear throughout to auscultation Heart: Regular rate and rhythm Abdomen: Soft, non-tender and non-distended. No masses, no hepatomegaly. Normal bowel sounds. Musculoskeletal: Symmetrical with no gross deformities.  Mobility issues noted; difficulty getting on and off table. Neurological: Alert oriented x 4, grossly nonfocal Psychological:  Alert and cooperative. Normal mood and affect.  Assessment and Recommendations: -Chronic diarrhea:  3-4 months with negative stool studies.  Will check CBC, CMP, TSH, and celiac labs.  Will schedule colonoscopy with possible random biopsies.  Will give questran powder to take once daily.

## 2013-02-28 ENCOUNTER — Encounter: Payer: Self-pay | Admitting: Internal Medicine

## 2013-02-28 LAB — TISSUE TRANSGLUTAMINASE, IGA: Tissue Transglutaminase Ab, IgA: 7.5 U/mL (ref <20)

## 2013-03-01 ENCOUNTER — Encounter (HOSPITAL_COMMUNITY): Payer: Self-pay | Admitting: *Deleted

## 2013-03-01 ENCOUNTER — Encounter (HOSPITAL_COMMUNITY)
Admission: RE | Disposition: A | Discharge status: Home / Self Care | Payer: Self-pay | Source: Ambulatory Visit | Attending: Internal Medicine

## 2013-03-01 ENCOUNTER — Ambulatory Visit (HOSPITAL_COMMUNITY)
Admission: RE | Admit: 2013-03-01 | Discharge: 2013-03-01 | Disposition: A | Discharge status: Home / Self Care | Payer: Medicare Other | Source: Ambulatory Visit | Attending: Internal Medicine | Admitting: Internal Medicine

## 2013-03-01 DIAGNOSIS — R197 Diarrhea, unspecified: Secondary | ICD-10-CM

## 2013-03-01 DIAGNOSIS — K5289 Other specified noninfective gastroenteritis and colitis: Secondary | ICD-10-CM | POA: Insufficient documentation

## 2013-03-01 DIAGNOSIS — K529 Noninfective gastroenteritis and colitis, unspecified: Secondary | ICD-10-CM

## 2013-03-01 DIAGNOSIS — R159 Full incontinence of feces: Secondary | ICD-10-CM | POA: Insufficient documentation

## 2013-03-01 HISTORY — DX: Barrett's esophagus with high grade dysplasia: K22.711

## 2013-03-01 HISTORY — PX: COLONOSCOPY: SHX5424

## 2013-03-01 LAB — GLUCOSE, CAPILLARY: Glucose-Capillary: 109 mg/dL — ABNORMAL HIGH (ref 70–99)

## 2013-03-01 SURGERY — COLONOSCOPY
Anesthesia: Moderate Sedation

## 2013-03-01 MED ORDER — FENTANYL CITRATE 0.05 MG/ML IJ SOLN
INTRAMUSCULAR | Status: AC
  Filled 2013-03-01: qty 2

## 2013-03-01 MED ORDER — MIDAZOLAM HCL 5 MG/5ML IJ SOLN
INTRAMUSCULAR | Status: DC | PRN
  Administered 2013-03-01 (×2): 2 mg via INTRAVENOUS

## 2013-03-01 MED ORDER — MIDAZOLAM HCL 10 MG/2ML IJ SOLN
INTRAMUSCULAR | Status: AC
  Filled 2013-03-01: qty 2

## 2013-03-01 MED ORDER — SODIUM CHLORIDE 0.9 % IV SOLN
INTRAVENOUS | Status: DC
  Administered 2013-03-01: 500 mL via INTRAVENOUS

## 2013-03-01 MED ORDER — FENTANYL CITRATE 0.05 MG/ML IJ SOLN
INTRAMUSCULAR | Status: DC | PRN
  Administered 2013-03-01 (×2): 20 ug via INTRAVENOUS

## 2013-03-01 NOTE — H&P (View-Only) (Signed)
02/27/2013 [name] [id number] 12/16/1940   History of Present Illness:  Patient is a 72 year old male who is a patient of Dr. Gessner's.  He presents to our office today with complaints of diarrhea for the past 3-4 months.  Says that he has anywhere from 2-4 BM's a day and they are always loose or watery.  Sometimes he has incontinence.  Seems to be worsening.  He saw his PCP who ordered stool for Cdiff, enteric pathogens, WBC's, and O & P, which were all negative.  He denies abdominal pain and rectal bleeding.  Some nausea at times but no vomiting.  Does not have an appetite and is not eating much, but trying to drink quite a bit.  His PCP also gave him lomotil, but it is not helping.  Last colonoscopy was in 06/2005 with recommended repeat in 7-10 years.  Current Medications, Allergies, Past Medical History, Past Surgical History, Family History and Social History were reviewed in [facility] electronic medical record.   Physical Exam: BP 112/64  Pulse 58  Ht 5' 9" (1.753 m)  Wt 161 lb (73.029 kg)  BMI 23.76 kg/m2 General: Elderly white male in no acute distress Head: Normocephalic and atraumatic Eyes:  sclerae anicteric, conjunctiva pink  Ears: Normal auditory acuity Lungs: Clear throughout to auscultation Heart: Regular rate and rhythm Abdomen: Soft, non-tender and non-distended. No masses, no hepatomegaly. Normal bowel sounds. Musculoskeletal: Symmetrical with no gross deformities.  Mobility issues noted; difficulty getting on and off table. Neurological: Alert oriented x 4, grossly nonfocal Psychological:  Alert and cooperative. Normal mood and affect.  Assessment and Recommendations: -Chronic diarrhea:  3-4 months with negative stool studies.  Will check CBC, CMP, TSH, and celiac labs.  Will schedule colonoscopy with possible random biopsies.  Will give questran powder to take once daily. 

## 2013-03-01 NOTE — Interval H&P Note (Signed)
History and Physical Interval Note:  03/01/2013 10:11 AM  [name]  has presented today for surgery, with the diagnosis of Chronic diarrhea [787.91]  The various methods of treatment have been discussed with the patient and family. After consideration of risks, benefits and other options for treatment, the patient has consented to  Procedure(s): COLONOSCOPY (N/A) as a surgical intervention .  The patient's history has been reviewed, patient examined, no change in status, stable for surgery.  I have reviewed the patient's chart and labs.  Questions were answered to the patient's satisfaction.     Iva Boop, MD, [facility]

## 2013-03-01 NOTE — Op Note (Signed)
[facility] [address] [place], [address]   COLONOSCOPY PROCEDURE REPORT  PATIENT: [name], [name].  MR#: [id number] BIRTHDATE: 12-06-1940 , 71  yrs. old GENDER: Male ENDOSCOPIST: Iva Boop, MD, [facility] PROCEDURE DATE:  03/01/2013 PROCEDURE:   Colonoscopy with biopsy ASA CLASS:   Class III INDICATIONS:chronic diarrhea. MEDICATIONS: Fentanyl-Detailed 40 mcg IV and Versed 4 mg IV  DESCRIPTION OF PROCEDURE:   After the risks benefits and alternatives of the procedure were thoroughly explained, informed consent was obtained.  A digital rectal exam revealed no abnormalities of the rectum, A digital rectal exam revealed no prostatic nodules, and A digital rectal exam revealed the prostate was not enlarged.   The Pentax Adult Colonscope [id number]  endoscope was introduced through the anus and advanced to the cecum, which was identified by both the appendix and ileocecal valve. No adverse events experienced.   The quality of the prep was Suprep good  The instrument was then slowly withdrawn as the colon was fully examined.      COLON FINDINGS: The colonic mucosa appeared normal throughout the entire examined colon.  Unable to enter terminal ileum. Multiple random biopsies of the area were performed.  Retroflexed views revealed no abnormalities. The time to cecum=3 minutes 0 seconds. Withdrawal time=8 minutes 0 seconds.  The scope was withdrawn and the procedure completed. COMPLICATIONS: There were no complications.  ENDOSCOPIC IMPRESSION: The colonic mucosa appeared normal throughout the entire examined colon; multiple random biopsies of the area were performed  RECOMMENDATIONS: Office will call with the results.   eSigned:  Iva Boop, MD, [facility] 03/01/2013 10:54 AM   cc: The Patient

## 2013-03-02 ENCOUNTER — Encounter: Payer: Self-pay | Admitting: Internal Medicine

## 2013-03-02 ENCOUNTER — Encounter (HOSPITAL_COMMUNITY): Payer: Self-pay | Admitting: Internal Medicine

## 2013-03-02 ENCOUNTER — Telehealth: Payer: Self-pay | Admitting: Internal Medicine

## 2013-03-02 NOTE — Progress Notes (Signed)
Quick Note:  Biopsies show lymphocytic colitis - should respond to steroids  Would like for him to take budesonide 9 mg daily and see me in 6 weeks Rx # 37month supply and 1 refill If too expensive let us know and will use low-dose prednisone - cheaper but greater chance of raising blood sugar and other side effects  Colon recall 10 years ______

## 2013-03-02 NOTE — Telephone Encounter (Signed)
Patient asking about results. He is advised he will be notified by letter or call when they come back

## 2013-03-03 ENCOUNTER — Telehealth: Payer: Self-pay | Admitting: Internal Medicine

## 2013-03-03 ENCOUNTER — Other Ambulatory Visit: Payer: Self-pay | Admitting: Internal Medicine

## 2013-03-03 MED ORDER — BUDESONIDE 9 MG PO TB24: 9.0000 mg | ORAL_TABLET | Freq: Every day | ORAL | Status: DC

## 2013-03-03 MED ORDER — PREDNISONE 10 MG PO TABS: ORAL_TABLET | ORAL | Status: DC

## 2013-03-03 NOTE — Telephone Encounter (Signed)
Pt aware. Script sent to pharmacy. 

## 2013-03-03 NOTE — Telephone Encounter (Signed)
Prednisone 10 mg tabs -   Take 2 daily x 7 days 1 daily x 14 days 1/2 tab daily x 2 weeks then stop (unless still having diarrhea)  Be advised blood sugars may go up and he may need help from PCP about this  Should have f/u me set - let me know if not Was thinking about 1 month from now

## 2013-03-03 NOTE — Telephone Encounter (Signed)
The patient states that the medicine that was called him for him is 95.00 and to expensive.  He does not know the name of the medicine.  Is there something else he can try?

## 2013-03-03 NOTE — Telephone Encounter (Signed)
Pt states budesonide is to expensive. Per result not pt may try prednisone. Please advise regarding the prednisone rx.

## 2013-03-04 ENCOUNTER — Other Ambulatory Visit: Payer: Self-pay | Admitting: Internal Medicine

## 2013-03-07 NOTE — ED Provider Notes (Signed)
History    71yM sent for further evaluation of leukocytosis. Sent as part of outpt w/u. Pt has been having diarrhea for months. Multiple watery stools per day. Feels generally weak and "plain bad." Poor appetite. No blood in stool. No fever. Occasionally crampy abdominal pain. Doesn't localize. Seen by GI today.  CSN: [id number]  Arrival date & time 02/27/13  1609   First MD Initiated Contact with Patient 02/27/13 2106      Chief Complaint  Patient presents with  . Fatigue    (Consider location/radiation/quality/duration/timing/severity/associated sxs/prior treatment) HPI  Past Medical History  Diagnosis Date  . Diabetes mellitus   . Hypertension   . Anxiety   . COPD (chronic obstructive pulmonary disease)   . Hyperlipemia   . GERD (gastroesophageal reflux disease)   . Barrett's esophagus with high grade dysplasia     s/p RFA  . PVD (peripheral vascular disease)   . CAD (coronary artery disease)   . Tobacco user   . Depression   . Hiatal hernia   . Myocardial infarction 12/1996  . AAA (abdominal aortic aneurysm)   . Renal insufficiency 2010    Past Surgical History  Procedure Laterality Date  . Tonsillectomy    . Nissen fundoplication    . Abdominal aortic aneurysm repair  2010    and right common iliac artery aneurysm, DR HAYES  . Upper gastrointestinal endoscopy  07/07/2011    barretts esophagus, hiatal hernia, gastritis, ablations in esophagus  . Colonoscopy  05/07/2005    diverticulosis, internal and external hemorrhoids  . Abdominal aortic aneurysm repair    . Radiofrequency ablation  multiple    Barrett's, high-grade dysplasia  . Colonoscopy N/A 03/01/2013    Procedure: COLONOSCOPY;  Surgeon: Iva Boop, MD;  Location: [facility];  Service: Endoscopy;  Laterality: N/A;    Family History  Problem Relation Age of Onset  . Coronary artery disease Other   . Diabetes Other   . Asthma Other   . Mental illness Mother     dementia  . Cancer Mother   .  Diabetes Mother   . Heart disease Father   . Diabetes Father   . Stomach cancer Father   . Cancer Father   . Colon cancer Neg Hx   . Esophageal cancer Neg Hx     History  Substance Use Topics  . Smoking status: Current Every Day Smoker -- 1.00 packs/day for 54 years    Types: Cigarettes  . Smokeless tobacco: Never Used  . Alcohol Use: No      Review of Systems  All systems reviewed and negative, other than as noted in HPI.   Allergies  Tramadol; Amlodipine besylate; Benazepril hcl; Penicillins; Risperidone; and Valsartan  Home Medications   Current Outpatient Rx  Name  Route  Sig  Dispense  Refill  . ALPRAZolam (XANAX) 0.5 MG tablet   Oral   Take 1 tablet (0.5 mg total) by mouth 2 (two) times daily as needed for sleep.   60 tablet   5   . aspirin EC 81 MG tablet   Oral   Take 81 mg by mouth every evening.         Marland Kitchen atorvastatin (LIPITOR) 40 MG tablet   Oral   Take 40 mg by mouth every morning.         . busPIRone (BUSPAR) 15 MG tablet   Oral   Take 15 mg by mouth 2 (two) times daily.         Marland Kitchen  Cholecalciferol 1000 UNITS tablet   Oral   Take 1,000 Units by mouth daily.           . cloNIDine (CATAPRES) 0.1 MG tablet   Oral   Take 0.1 mg by mouth 2 (two) times daily.         . diphenoxylate-atropine (LOMOTIL) 2.5-0.025 MG per tablet   Oral   Take 1 tablet by mouth 4 (four) times daily as needed for diarrhea or loose stools.   60 tablet   1   . gabapentin (NEURONTIN) 300 MG capsule   Oral   Take 300 mg by mouth 3 (three) times daily.         Marland Kitchen glimepiride (AMARYL) 1 MG tablet   Oral   Take 1 mg by mouth daily before breakfast.         . labetalol (NORMODYNE) 300 MG tablet   Oral   Take 1 tablet (300 mg total) by mouth 3 (three) times daily.   90 tablet   11   . nortriptyline (PAMELOR) 10 MG capsule   Oral   Take 20 mg by mouth at bedtime.         Marland Kitchen omeprazole (PRILOSEC) 40 MG capsule   Oral   Take 80 mg by mouth daily.          Marland Kitchen PARoxetine (PAXIL) 20 MG tablet   Oral   Take 20 mg by mouth 2 (two) times daily.          . promethazine (PHENERGAN) 12.5 MG tablet   Oral   Take 1-2 tablets (12.5-25 mg total) by mouth every 8 (eight) hours as needed for nausea.   60 tablet   0   . sodium bicarbonate 650 MG tablet   Oral   Take 1,300 mg by mouth daily.           . temazepam (RESTORIL) 15 MG capsule   Oral   Take 1-2 capsules (15-30 mg total) by mouth at bedtime as needed for sleep.   60 capsule   5     X   . triamterene-hydrochlorothiazide (MAXZIDE-25) 37.5-25 MG per tablet   Oral   Take 1 tablet by mouth daily.         . Budesonide 9 MG TB24   Oral   Take 9 mg by mouth daily.   30 tablet   1   . busPIRone (BUSPAR) 15 MG tablet      TAKE 1/2 TABLET BY MOUTH TWICE A DAY   60 tablet   2   . predniSONE (DELTASONE) 10 MG tablet      Take 2 tablets daily for 7 days Take 1 tablet daily for 14 days Take 1/2 tablet daily for 2 weeks and then stop   60 tablet   0     BP 177/75  Pulse 86  Temp(Src) 98.7 F (37.1 C) (Oral)  Resp 18  SpO2 93%  Physical Exam  Nursing note and vitals reviewed. Constitutional: He appears well-developed and well-nourished. No distress.  HENT:  Head: Normocephalic and atraumatic.  Eyes: Conjunctivae are normal. Right eye exhibits no discharge. Left eye exhibits no discharge.  Neck: Neck supple.  Cardiovascular: Normal rate, regular rhythm and normal heart sounds.  Exam reveals no gallop and no friction rub.   No murmur heard. Pulmonary/Chest: Effort normal and breath sounds normal. No respiratory distress.  Abdominal: Soft. He exhibits no distension. There is no tenderness.  Musculoskeletal: He exhibits no edema and no tenderness.  Neurological: He is alert.  Skin: Skin is warm and dry.  Psychiatric: He has a normal mood and affect. His behavior is normal. Thought content normal.    ED Course  Procedures (including critical care time)  Labs  Reviewed  CBC - Abnormal; Notable for the following:    WBC 16.5 (*)    RBC 3.40 (*)    Hemoglobin 10.1 (*)    HCT 31.1 (*)    All other components within normal limits  BASIC METABOLIC PANEL - Abnormal; Notable for the following:    Sodium 133 (*)    CO2 17 (*)    Glucose, Bld 133 (*)    BUN 46 (*)    Creatinine, Ser 3.66 (*)    GFR calc non Af Amer 15 (*)    GFR calc Af Amer 18 (*)    All other components within normal limits  GLUCOSE, CAPILLARY - Abnormal; Notable for the following:    Glucose-Capillary 119 (*)    All other components within normal limits  URINALYSIS, ROUTINE W REFLEX MICROSCOPIC - Abnormal; Notable for the following:    Bilirubin Urine SMALL (*)    All other components within normal limits   No results found.   1. Leukocytosis   2. Acute on chronic renal failure   3. Diarrhea       MDM  71yM with diarrhea. Ongoing for months. Acute on chronic renal failure. IVF given. Pt HD stable and fairly well appearing. Seen in GI office today. Additional meds started. I feel pt is safe for DC at this time. He does have leukocytosis but this is nonspecific. I have a low suspicion for SBI. Follow-up with GI per recommendations. Emergent return precautions discussed.         Raeford Razor, MD 03/07/13 [phone]

## 2013-03-10 ENCOUNTER — Telehealth: Payer: Self-pay | Admitting: Internal Medicine

## 2013-03-10 NOTE — Telephone Encounter (Signed)
Patient has not been taking his prednisone properly.  He states he has fallen several times in the last week and he feels it is related to the prednisone.  On 03/03/13 he was prescribed 20 mg for 7 days, 10 mg for 14 days,  then 5 mg for 2 weeks.  He reports he is only taking 5 mg or 1/2 tablet.  He feels this is making him dizzy.  Dr. Leone Payor please advise if ok to stop prednisone.  He no longer has diarrhea.

## 2013-03-10 NOTE — Telephone Encounter (Signed)
Patient advised.

## 2013-03-10 NOTE — Telephone Encounter (Signed)
Stop the prednisone.

## 2013-03-13 ENCOUNTER — Ambulatory Visit (INDEPENDENT_AMBULATORY_CARE_PROVIDER_SITE_OTHER): Payer: Medicare Other | Admitting: Internal Medicine

## 2013-03-13 ENCOUNTER — Encounter: Payer: Self-pay | Admitting: Internal Medicine

## 2013-03-13 ENCOUNTER — Ambulatory Visit (INDEPENDENT_AMBULATORY_CARE_PROVIDER_SITE_OTHER)
Admission: RE | Admit: 2013-03-13 | Discharge: 2013-03-13 | Disposition: A | Discharge status: Home / Self Care | Payer: Medicare Other | Source: Ambulatory Visit | Attending: Internal Medicine | Admitting: Internal Medicine

## 2013-03-13 ENCOUNTER — Telehealth: Payer: Self-pay | Admitting: *Deleted

## 2013-03-13 ENCOUNTER — Other Ambulatory Visit (INDEPENDENT_AMBULATORY_CARE_PROVIDER_SITE_OTHER): Payer: Medicare Other

## 2013-03-13 ENCOUNTER — Other Ambulatory Visit: Payer: Medicare Other

## 2013-03-13 ENCOUNTER — Telehealth: Payer: Self-pay | Admitting: Internal Medicine

## 2013-03-13 VITALS — BP 100/70 | HR 76 | Temp 99.1°F | Resp 16 | Wt 149.0 lb

## 2013-03-13 DIAGNOSIS — R222 Localized swelling, mass and lump, trunk: Secondary | ICD-10-CM

## 2013-03-13 DIAGNOSIS — Z9181 History of falling: Secondary | ICD-10-CM

## 2013-03-13 DIAGNOSIS — I798 Other disorders of arteries, arterioles and capillaries in diseases classified elsewhere: Secondary | ICD-10-CM

## 2013-03-13 DIAGNOSIS — R634 Abnormal weight loss: Secondary | ICD-10-CM | POA: Insufficient documentation

## 2013-03-13 DIAGNOSIS — K22711 Barrett's esophagus with high grade dysplasia: Secondary | ICD-10-CM

## 2013-03-13 DIAGNOSIS — R296 Repeated falls: Secondary | ICD-10-CM

## 2013-03-13 DIAGNOSIS — E1151 Type 2 diabetes mellitus with diabetic peripheral angiopathy without gangrene: Secondary | ICD-10-CM

## 2013-03-13 DIAGNOSIS — I2510 Atherosclerotic heart disease of native coronary artery without angina pectoris: Secondary | ICD-10-CM

## 2013-03-13 DIAGNOSIS — K5289 Other specified noninfective gastroenteritis and colitis: Secondary | ICD-10-CM

## 2013-03-13 DIAGNOSIS — K2270 Barrett's esophagus without dysplasia: Secondary | ICD-10-CM

## 2013-03-13 DIAGNOSIS — J189 Pneumonia, unspecified organism: Secondary | ICD-10-CM

## 2013-03-13 DIAGNOSIS — R509 Fever, unspecified: Secondary | ICD-10-CM

## 2013-03-13 DIAGNOSIS — N259 Disorder resulting from impaired renal tubular function, unspecified: Secondary | ICD-10-CM

## 2013-03-13 DIAGNOSIS — E1159 Type 2 diabetes mellitus with other circulatory complications: Secondary | ICD-10-CM

## 2013-03-13 DIAGNOSIS — D72829 Elevated white blood cell count, unspecified: Secondary | ICD-10-CM

## 2013-03-13 DIAGNOSIS — R918 Other nonspecific abnormal finding of lung field: Secondary | ICD-10-CM

## 2013-03-13 DIAGNOSIS — K52832 Lymphocytic colitis: Secondary | ICD-10-CM

## 2013-03-13 LAB — HEPATIC FUNCTION PANEL
Alkaline Phosphatase: 209 U/L — ABNORMAL HIGH (ref 39–117)
Bilirubin, Direct: 0.1 mg/dL (ref 0.0–0.3)
Total Bilirubin: 0.7 mg/dL (ref 0.3–1.2)

## 2013-03-13 LAB — CBC WITH DIFFERENTIAL/PLATELET
Basophils Relative: 0.5 % (ref 0.0–3.0)
Eosinophils Absolute: 0.3 10*3/uL (ref 0.0–0.7)
Lymphs Abs: 1.4 10*3/uL (ref 0.7–4.0)
MCHC: 33.7 g/dL (ref 30.0–36.0)
MCV: 88.7 fl (ref 78.0–100.0)
Monocytes Absolute: 1.1 10*3/uL — ABNORMAL HIGH (ref 0.1–1.0)
Neutrophils Relative %: 87.8 % — ABNORMAL HIGH (ref 43.0–77.0)
Platelets: 371 10*3/uL (ref 150.0–400.0)

## 2013-03-13 LAB — TSH: TSH: 2.24 u[IU]/mL (ref 0.35–5.50)

## 2013-03-13 LAB — LIPASE: Lipase: 21 U/L (ref 11.0–59.0)

## 2013-03-13 LAB — SEDIMENTATION RATE: Sed Rate: 115 mm/hr — ABNORMAL HIGH (ref 0–22)

## 2013-03-13 LAB — BASIC METABOLIC PANEL
BUN: 64 mg/dL — ABNORMAL HIGH (ref 6–23)
CO2: 17 mEq/L — ABNORMAL LOW (ref 19–32)
Chloride: 99 mEq/L (ref 96–112)
Creatinine, Ser: 3.6 mg/dL — ABNORMAL HIGH (ref 0.4–1.5)

## 2013-03-13 LAB — HEMOGLOBIN A1C: Hgb A1c MFr Bld: 6.3 % (ref 4.6–6.5)

## 2013-03-13 NOTE — Assessment & Plan Note (Signed)
  Head CT Labs 

## 2013-03-13 NOTE — Assessment & Plan Note (Signed)
Labs

## 2013-03-13 NOTE — Telephone Encounter (Signed)
Pt's CXR abnormal with a peripheral mass in Right upper lobe. Suggest CT thorax. Per Dr. [name] I ordered CT chest and abdomen w/o IV contrast (oral only).

## 2013-03-13 NOTE — Telephone Encounter (Signed)
Per the pathology report you do want to see him.  He was notified and will keep his 04/17/13 follow-up appointment.

## 2013-03-13 NOTE — Assessment & Plan Note (Signed)
Wt Readings from Last 3 Encounters:  03/13/13 149 lb (67.586 kg)  02/27/13 161 lb (73.029 kg)  02/02/13 167 lb (75.751 kg)

## 2013-03-13 NOTE — Assessment & Plan Note (Signed)
Continue with current prescription therapy as reflected on the Med list.  

## 2013-03-13 NOTE — Progress Notes (Signed)
   Subjective:     HPI  C/o wt loss, fatigue, chills x 1 month. No diarrhea now. No nausea. No vomiting...  The patient presents for a follow-up of  chronic hypertension, chronic dyslipidemia, type 2 diabetes, COPD, Barrett's, CAD controlled with medicines. He had Barrett's ablation x4, one more is due    Review of Systems  Constitutional: Positive for unexpected weight change. Negative for appetite change and fatigue.  HENT: Negative for nosebleeds, congestion, sore throat, sneezing, trouble swallowing and neck pain.   Eyes: Negative for itching and visual disturbance.  Respiratory: Negative for cough.   Cardiovascular: Negative for chest pain, palpitations and leg swelling.  Gastrointestinal: Positive for diarrhea. Negative for nausea, vomiting, constipation, blood in stool, abdominal distention and rectal pain.  Genitourinary: Negative for frequency and hematuria.  Musculoskeletal: Negative for back pain, joint swelling and gait problem.  Skin: Negative for rash.  Neurological: Negative for dizziness, tremors, speech difficulty and weakness.  Psychiatric/Behavioral: Negative for sleep disturbance, dysphoric mood and agitation. The patient is not nervous/anxious.    Wt Readings from Last 3 Encounters:  03/13/13 149 lb (67.586 kg)  02/27/13 161 lb (73.029 kg)  02/02/13 167 lb (75.751 kg)   BP Readings from Last 3 Encounters:  03/13/13 100/70  03/01/13 165/93  03/01/13 165/93        Objective:   Physical Exam  Constitutional: He is oriented to person, place, and time. He appears well-developed.  HENT:  Head: Normocephalic and atraumatic.  Mouth/Throat: No oropharyngeal exudate.  Eyes: Conjunctivae are normal. Pupils are equal, round, and reactive to light.  Neck: Normal range of motion. No JVD present. No thyromegaly present.  Cardiovascular: Normal rate, regular rhythm, normal heart sounds and intact distal pulses.  Exam reveals no gallop and no friction rub.   No  murmur heard. Pulmonary/Chest: Effort normal and breath sounds normal. No respiratory distress. He has no wheezes. He has no rales. He exhibits no tenderness.  Abdominal: Soft. Bowel sounds are normal. He exhibits no distension and no mass. There is no tenderness. There is no rebound and no guarding.  Musculoskeletal: Normal range of motion. He exhibits no edema and no tenderness.  Lymphadenopathy:    He has no cervical adenopathy.  Neurological: He is alert and oriented to person, place, and time. He has normal reflexes. No cranial nerve deficit. He exhibits normal muscle tone. Coordination normal.  Skin: Skin is warm and dry. No rash noted.  Psychiatric: He has a normal mood and affect. His behavior is normal. Judgment and thought content normal.  AKs   Lab Results  Component Value Date   WBC 16.5* 02/27/2013   HGB 10.1* 02/27/2013   HCT 31.1* 02/27/2013   PLT 282 02/27/2013   GLUCOSE 133* 02/27/2013   CHOL 154 08/11/2012   TRIG 326.0* 08/11/2012   HDL 31.40* 08/11/2012   LDLDIRECT 80.2 08/11/2012   LDLCALC 90 02/10/2011   ALT 25 02/27/2013   AST 24 02/27/2013   NA 133* 02/27/2013   K 3.9 02/27/2013   CL 98 02/27/2013   CREATININE 3.66* 02/27/2013   BUN 46* 02/27/2013   CO2 17* 02/27/2013   TSH 1.55 02/27/2013   PSA 0.99 04/17/2010   INR 1.0 01/21/2009   HGBA1C 5.7 01/23/2013   CXR 5/12 IMPRESSION:  Peripheral mass in the right upper lobe is concerning for neoplasm versus pneumonia. Recommend CT thorax with contrast for further evaluation.   A complex case     Assessment & Plan:

## 2013-03-13 NOTE — Assessment & Plan Note (Signed)
He did fine w/his last procedure

## 2013-03-13 NOTE — Assessment & Plan Note (Signed)
labs

## 2013-03-14 ENCOUNTER — Encounter (HOSPITAL_COMMUNITY): Payer: Self-pay

## 2013-03-14 ENCOUNTER — Inpatient Hospital Stay (HOSPITAL_COMMUNITY)
Admission: EM | Admit: 2013-03-14 | Discharge: 2013-03-18 | DRG: 204 | Disposition: A | Discharge status: Home Health | Payer: Medicare Other | Attending: Internal Medicine | Admitting: Internal Medicine

## 2013-03-14 ENCOUNTER — Inpatient Hospital Stay (HOSPITAL_COMMUNITY): Payer: Medicare Other

## 2013-03-14 ENCOUNTER — Emergency Department (HOSPITAL_COMMUNITY): Payer: Medicare Other

## 2013-03-14 DIAGNOSIS — K5289 Other specified noninfective gastroenteritis and colitis: Secondary | ICD-10-CM | POA: Diagnosis present

## 2013-03-14 DIAGNOSIS — R296 Repeated falls: Secondary | ICD-10-CM

## 2013-03-14 DIAGNOSIS — R509 Fever, unspecified: Secondary | ICD-10-CM

## 2013-03-14 DIAGNOSIS — K22711 Barrett's esophagus with high grade dysplasia: Secondary | ICD-10-CM

## 2013-03-14 DIAGNOSIS — J449 Chronic obstructive pulmonary disease, unspecified: Secondary | ICD-10-CM | POA: Diagnosis present

## 2013-03-14 DIAGNOSIS — R918 Other nonspecific abnormal finding of lung field: Secondary | ICD-10-CM | POA: Diagnosis present

## 2013-03-14 DIAGNOSIS — K529 Noninfective gastroenteritis and colitis, unspecified: Secondary | ICD-10-CM

## 2013-03-14 DIAGNOSIS — K52832 Lymphocytic colitis: Secondary | ICD-10-CM

## 2013-03-14 DIAGNOSIS — L570 Actinic keratosis: Secondary | ICD-10-CM

## 2013-03-14 DIAGNOSIS — N179 Acute kidney failure, unspecified: Secondary | ICD-10-CM | POA: Diagnosis present

## 2013-03-14 DIAGNOSIS — J4489 Other specified chronic obstructive pulmonary disease: Secondary | ICD-10-CM | POA: Diagnosis present

## 2013-03-14 DIAGNOSIS — K219 Gastro-esophageal reflux disease without esophagitis: Secondary | ICD-10-CM

## 2013-03-14 DIAGNOSIS — I714 Abdominal aortic aneurysm, without rupture, unspecified: Secondary | ICD-10-CM | POA: Diagnosis present

## 2013-03-14 DIAGNOSIS — R110 Nausea: Secondary | ICD-10-CM

## 2013-03-14 DIAGNOSIS — I2510 Atherosclerotic heart disease of native coronary artery without angina pectoris: Secondary | ICD-10-CM

## 2013-03-14 DIAGNOSIS — E876 Hypokalemia: Secondary | ICD-10-CM | POA: Diagnosis not present

## 2013-03-14 DIAGNOSIS — R7401 Elevation of levels of liver transaminase levels: Secondary | ICD-10-CM | POA: Diagnosis present

## 2013-03-14 DIAGNOSIS — J189 Pneumonia, unspecified organism: Secondary | ICD-10-CM

## 2013-03-14 DIAGNOSIS — N189 Chronic kidney disease, unspecified: Secondary | ICD-10-CM | POA: Diagnosis present

## 2013-03-14 DIAGNOSIS — N259 Disorder resulting from impaired renal tubular function, unspecified: Secondary | ICD-10-CM

## 2013-03-14 DIAGNOSIS — E872 Acidosis, unspecified: Secondary | ICD-10-CM | POA: Diagnosis present

## 2013-03-14 DIAGNOSIS — R7402 Elevation of levels of lactic acid dehydrogenase (LDH): Secondary | ICD-10-CM | POA: Diagnosis present

## 2013-03-14 DIAGNOSIS — R5381 Other malaise: Secondary | ICD-10-CM | POA: Diagnosis present

## 2013-03-14 DIAGNOSIS — N183 Chronic kidney disease, stage 3 unspecified: Secondary | ICD-10-CM | POA: Diagnosis present

## 2013-03-14 DIAGNOSIS — E785 Hyperlipidemia, unspecified: Secondary | ICD-10-CM

## 2013-03-14 DIAGNOSIS — J441 Chronic obstructive pulmonary disease with (acute) exacerbation: Secondary | ICD-10-CM

## 2013-03-14 DIAGNOSIS — I798 Other disorders of arteries, arterioles and capillaries in diseases classified elsewhere: Secondary | ICD-10-CM | POA: Diagnosis present

## 2013-03-14 DIAGNOSIS — E1159 Type 2 diabetes mellitus with other circulatory complications: Secondary | ICD-10-CM | POA: Diagnosis present

## 2013-03-14 DIAGNOSIS — D72829 Elevated white blood cell count, unspecified: Secondary | ICD-10-CM | POA: Diagnosis present

## 2013-03-14 DIAGNOSIS — J439 Emphysema, unspecified: Secondary | ICD-10-CM | POA: Diagnosis present

## 2013-03-14 DIAGNOSIS — I129 Hypertensive chronic kidney disease with stage 1 through stage 4 chronic kidney disease, or unspecified chronic kidney disease: Secondary | ICD-10-CM | POA: Diagnosis present

## 2013-03-14 DIAGNOSIS — F411 Generalized anxiety disorder: Secondary | ICD-10-CM

## 2013-03-14 DIAGNOSIS — R222 Localized swelling, mass and lump, trunk: Principal | ICD-10-CM | POA: Diagnosis present

## 2013-03-14 DIAGNOSIS — D649 Anemia, unspecified: Secondary | ICD-10-CM | POA: Diagnosis present

## 2013-03-14 DIAGNOSIS — Z9181 History of falling: Secondary | ICD-10-CM

## 2013-03-14 DIAGNOSIS — N039 Chronic nephritic syndrome with unspecified morphologic changes: Secondary | ICD-10-CM | POA: Diagnosis present

## 2013-03-14 DIAGNOSIS — E875 Hyperkalemia: Secondary | ICD-10-CM

## 2013-03-14 DIAGNOSIS — E860 Dehydration: Secondary | ICD-10-CM | POA: Diagnosis present

## 2013-03-14 DIAGNOSIS — R55 Syncope and collapse: Secondary | ICD-10-CM | POA: Diagnosis present

## 2013-03-14 DIAGNOSIS — R634 Abnormal weight loss: Secondary | ICD-10-CM | POA: Diagnosis present

## 2013-03-14 DIAGNOSIS — G4700 Insomnia, unspecified: Secondary | ICD-10-CM

## 2013-03-14 DIAGNOSIS — E1151 Type 2 diabetes mellitus with diabetic peripheral angiopathy without gangrene: Secondary | ICD-10-CM | POA: Diagnosis present

## 2013-03-14 DIAGNOSIS — D485 Neoplasm of uncertain behavior of skin: Secondary | ICD-10-CM

## 2013-03-14 DIAGNOSIS — D631 Anemia in chronic kidney disease: Secondary | ICD-10-CM | POA: Diagnosis present

## 2013-03-14 DIAGNOSIS — K2270 Barrett's esophagus without dysplasia: Secondary | ICD-10-CM | POA: Diagnosis present

## 2013-03-14 DIAGNOSIS — R7989 Other specified abnormal findings of blood chemistry: Secondary | ICD-10-CM | POA: Diagnosis present

## 2013-03-14 DIAGNOSIS — I951 Orthostatic hypotension: Secondary | ICD-10-CM | POA: Diagnosis not present

## 2013-03-14 DIAGNOSIS — I739 Peripheral vascular disease, unspecified: Secondary | ICD-10-CM

## 2013-03-14 DIAGNOSIS — F17200 Nicotine dependence, unspecified, uncomplicated: Secondary | ICD-10-CM | POA: Diagnosis present

## 2013-03-14 DIAGNOSIS — F329 Major depressive disorder, single episode, unspecified: Secondary | ICD-10-CM

## 2013-03-14 LAB — URINALYSIS, ROUTINE W REFLEX MICROSCOPIC
Glucose, UA: NEGATIVE mg/dL
Hgb urine dipstick: NEGATIVE
Ketones, ur: NEGATIVE mg/dL
Leukocytes, UA: NEGATIVE
Protein, ur: NEGATIVE mg/dL
pH: 5 (ref 5.0–8.0)

## 2013-03-14 LAB — CBC WITH DIFFERENTIAL/PLATELET
Basophils Absolute: 0 10*3/uL (ref 0.0–0.1)
Eosinophils Relative: 2 % (ref 0–5)
HCT: 28.2 % — ABNORMAL LOW (ref 39.0–52.0)
Lymphocytes Relative: 10 % — ABNORMAL LOW (ref 12–46)
Lymphs Abs: 2 10*3/uL (ref 0.7–4.0)
MCH: 30 pg (ref 26.0–34.0)
MCV: 90.1 fL (ref 78.0–100.0)
Monocytes Absolute: 1.6 10*3/uL — ABNORMAL HIGH (ref 0.1–1.0)
RDW: 13.1 % (ref 11.5–15.5)
WBC: 21.6 10*3/uL — ABNORMAL HIGH (ref 4.0–10.5)

## 2013-03-14 LAB — POCT I-STAT, CHEM 8
Chloride: 107 mEq/L (ref 96–112)
Glucose, Bld: 117 mg/dL — ABNORMAL HIGH (ref 70–99)
HCT: 31 % — ABNORMAL LOW (ref 39.0–52.0)
Potassium: 3.9 mEq/L (ref 3.5–5.1)

## 2013-03-14 LAB — GLUCOSE, CAPILLARY: Glucose-Capillary: 97 mg/dL (ref 70–99)

## 2013-03-14 LAB — APTT: aPTT: 43 seconds — ABNORMAL HIGH (ref 24–37)

## 2013-03-14 LAB — PROTIME-INR
INR: 1.21 (ref 0.00–1.49)
Prothrombin Time: 15.1 seconds (ref 11.6–15.2)

## 2013-03-14 LAB — TROPONIN I: Troponin I: <0.3 ng/mL (ref <0.30)

## 2013-03-14 MED ORDER — INSULIN ASPART 100 UNIT/ML ~~LOC~~ SOLN
0.0000 [IU] | Freq: Three times a day (TID) | SUBCUTANEOUS | Status: DC
  Administered 2013-03-15 – 2013-03-16 (×2): 1 [IU] via SUBCUTANEOUS
  Administered 2013-03-16: 5 [IU] via SUBCUTANEOUS
  Administered 2013-03-16 – 2013-03-17 (×2): 2 [IU] via SUBCUTANEOUS
  Administered 2013-03-17: 3 [IU] via SUBCUTANEOUS
  Administered 2013-03-17: 2 [IU] via SUBCUTANEOUS

## 2013-03-14 MED ORDER — PAROXETINE HCL 20 MG PO TABS
20.0000 mg | ORAL_TABLET | Freq: Two times a day (BID) | ORAL | Status: DC
  Administered 2013-03-14 – 2013-03-18 (×8): 20 mg via ORAL
  Filled 2013-03-14 (×9): qty 1

## 2013-03-14 MED ORDER — TEMAZEPAM 15 MG PO CAPS
30.0000 mg | ORAL_CAPSULE | Freq: Once | ORAL | Status: AC
  Administered 2013-03-14: 30 mg via ORAL
  Filled 2013-03-14: qty 2

## 2013-03-14 MED ORDER — SODIUM CHLORIDE 0.9 % IV BOLUS (SEPSIS): 1000.0000 mL | Freq: Once | INTRAVENOUS | Status: DC

## 2013-03-14 MED ORDER — ONDANSETRON HCL 4 MG PO TABS: 4.0000 mg | ORAL_TABLET | Freq: Four times a day (QID) | ORAL | Status: DC | PRN

## 2013-03-14 MED ORDER — PANTOPRAZOLE SODIUM 40 MG PO TBEC
40.0000 mg | DELAYED_RELEASE_TABLET | Freq: Every day | ORAL | Status: DC
  Administered 2013-03-15 – 2013-03-18 (×4): 40 mg via ORAL
  Filled 2013-03-14 (×4): qty 1

## 2013-03-14 MED ORDER — NICOTINE 14 MG/24HR TD PT24
14.0000 mg | MEDICATED_PATCH | Freq: Every day | TRANSDERMAL | Status: DC
  Administered 2013-03-14 – 2013-03-18 (×5): 14 mg via TRANSDERMAL
  Filled 2013-03-14 (×5): qty 1

## 2013-03-14 MED ORDER — IOHEXOL 300 MG/ML  SOLN
50.0000 mL | Freq: Once | INTRAMUSCULAR | Status: AC | PRN
  Administered 2013-03-14: 50 mL via ORAL

## 2013-03-14 MED ORDER — SODIUM CHLORIDE 0.9 % IJ SOLN
3.0000 mL | Freq: Two times a day (BID) | INTRAMUSCULAR | Status: DC
  Administered 2013-03-15 – 2013-03-17 (×4): 3 mL via INTRAVENOUS

## 2013-03-14 MED ORDER — LABETALOL HCL 300 MG PO TABS
300.0000 mg | ORAL_TABLET | Freq: Two times a day (BID) | ORAL | Status: DC
  Administered 2013-03-14 – 2013-03-18 (×8): 300 mg via ORAL
  Filled 2013-03-14 (×9): qty 1

## 2013-03-14 MED ORDER — CLONIDINE HCL 0.1 MG PO TABS
0.1000 mg | ORAL_TABLET | Freq: Two times a day (BID) | ORAL | Status: DC
  Administered 2013-03-14 – 2013-03-18 (×9): 0.1 mg via ORAL
  Filled 2013-03-14 (×10): qty 1

## 2013-03-14 MED ORDER — DIPHENOXYLATE-ATROPINE 2.5-0.025 MG PO TABS: 1.0000 | ORAL_TABLET | Freq: Four times a day (QID) | ORAL | Status: DC | PRN

## 2013-03-14 MED ORDER — ONDANSETRON HCL 4 MG/2ML IJ SOLN: 4.0000 mg | Freq: Four times a day (QID) | INTRAMUSCULAR | Status: DC | PRN

## 2013-03-14 MED ORDER — BUSPIRONE HCL 15 MG PO TABS
7.5000 mg | ORAL_TABLET | Freq: Two times a day (BID) | ORAL | Status: DC
  Administered 2013-03-14 – 2013-03-18 (×8): 7.5 mg via ORAL
  Filled 2013-03-14 (×9): qty 1

## 2013-03-14 MED ORDER — ATORVASTATIN CALCIUM 40 MG PO TABS
40.0000 mg | ORAL_TABLET | Freq: Every day | ORAL | Status: DC
  Administered 2013-03-15 – 2013-03-17 (×3): 40 mg via ORAL
  Filled 2013-03-14 (×4): qty 1

## 2013-03-14 MED ORDER — SODIUM BICARBONATE 650 MG PO TABS
1300.0000 mg | ORAL_TABLET | Freq: Every day | ORAL | Status: DC
  Administered 2013-03-14 – 2013-03-15 (×2): 1300 mg via ORAL
  Filled 2013-03-14 (×2): qty 2

## 2013-03-14 MED ORDER — SODIUM CHLORIDE 0.9 % IV SOLN
INTRAVENOUS | Status: AC
  Administered 2013-03-14: 16:00:00 via INTRAVENOUS

## 2013-03-14 NOTE — Progress Notes (Signed)
Patient ID: [name], male   DOB: 24-Aug-1941, 72 y.o.   MRN: [id number] Request received for CT guided RUL lung mass biopsy on pt with hx smoking, COPD, wt loss and recent CT revealing 5 cm RUL mass. Imaging studies were reviewed by Dr. Miles Costain. Additional PMH as below. Exam: pt lethargic but arousable, dehydrated; family in room; chest- dim BS RUL, left clear; heart- RRR; abd- soft,+BS,NT; EXT- no edema.   Filed Vitals:   03/14/13 0918 03/14/13 1417  BP: 155/84 171/77  Pulse:  103  Temp: 99.8 F (37.7 C) 98.3 F (36.8 C)  TempSrc: Rectal Oral  Resp: 21 18  Height:  5\' 9"  (1.753 m)  Weight:  145 lb (65.772 kg)  SpO2: 96% 96%   Past Medical History  Diagnosis Date  . Diabetes mellitus   . Hypertension   . Anxiety   . COPD (chronic obstructive pulmonary disease)   . Hyperlipemia   . GERD (gastroesophageal reflux disease)   . Barrett's esophagus with high grade dysplasia     s/p RFA  . PVD (peripheral vascular disease)   . CAD (coronary artery disease)   . Tobacco user   . Depression   . Hiatal hernia   . Myocardial infarction 12/1996  . AAA (abdominal aortic aneurysm)   . Renal insufficiency 2010   Past Surgical History  Procedure Laterality Date  . Tonsillectomy    . Nissen fundoplication    . Abdominal aortic aneurysm repair  2010    and right common iliac artery aneurysm, DR HAYES  . Upper gastrointestinal endoscopy  07/07/2011    barretts esophagus, hiatal hernia, gastritis, ablations in esophagus  . Colonoscopy  05/07/2005    diverticulosis, internal and external hemorrhoids  . Abdominal aortic aneurysm repair    . Radiofrequency ablation  multiple    Barrett's, high-grade dysplasia  . Colonoscopy N/A 03/01/2013    Procedure: COLONOSCOPY;  Surgeon: Iva Boop, MD;  Location: [facility];  Service: Endoscopy;  Laterality: N/A;   Dg Chest 2 View  03/13/2013  *RADIOLOGY REPORT*  Clinical Data: Cough and fever  CHEST - 2 VIEW  Comparison: Chest radiograph 12/04/2010   Findings: Normal cardiac silhouette.  There is a rounded mass within the right upper lobe abutting the pleural surface measuring up to 7.5 cm.  Lungs are hyperinflated.  prominent nipple shadow noted on the left.  Aortic stent graft noted on the lateral projection in the infrarenal abdominal aorta.  IMPRESSION:  Peripheral mass in the right upper lobe is concerning for neoplasm versus pneumonia.  Recommend CT thorax with contrast for further evaluation.  These results will be called to the ordering clinician or representative by the Radiologist Assistant, and communication documented in the PACS Dashboard.   Original Report Authenticated By: Genevive Bi, M.D.    Ct Chest Wo Contrast  03/14/2013  *RADIOLOGY REPORT*  Clinical Data: Fever and weakness for 1 week.  Diagnosed with pneumonia 1 day ago.  CT CHEST WITHOUT CONTRAST  Technique:  Multidetector CT imaging of the chest was performed following the standard protocol without IV contrast.  Comparison: PA and lateral chest 12/04/2010 and 03/13/2013.  Findings: No pleural or pericardial effusion is identified.  There is no axillary, hilar or mediastinal lymphadenopathy.  Calcific coronary and aortic atherosclerosis is noted.  Heart size is upper normal.  The patient has a very small hiatal hernia.  There is a mass in the periphery of the posterior segment of the right upper lobe.  The lesion  measures 5.4 cm cranial-caudal by 4.7 cm AP by 4.1 cm transverse.  There is an area of central necrosis within the lesion.  The mass is contiguous with the pleural of the right chest wall.  The lungs are otherwise unremarkable.  Incidentally imaged upper abdomen demonstrates surgical clips in the central aspect of the upper abdomen as seen on prior study. Visualized intra-abdominal contents are otherwise unremarkable.  No focal bony abnormality is identified.  IMPRESSION:  1.  Right upper lobe mass contiguous with the pleura of the periphery of the posterior right upper  lobe is highly suspicious for carcinoma.  No lymphadenopathy or other evidence of metastatic disease identified. 2.  Calcific coronary aortic atherosclerosis. 3.  Very small hiatal hernia.   Original Report Authenticated By: Holley Dexter, M.D.   Results for orders placed during the hospital encounter of 03/14/13  CG4 I-STAT (LACTIC ACID)      Result Value Range   Lactic Acid, Venous 0.99  0.5 - 2.2 mmol/L  POCT I-STAT, CHEM 8      Result Value Range   Sodium 135  135 - 145 mEq/L   Potassium 3.9  3.5 - 5.1 mEq/L   Chloride 107  96 - 112 mEq/L   BUN 71 (*) 6 - 23 mg/dL   Creatinine, Ser 1.61 (*) 0.50 - 1.35 mg/dL   Glucose, Bld 096 (*) 70 - 99 mg/dL   Calcium, Ion 0.45 (*) 1.13 - 1.30 mmol/L   TCO2 20  0 - 100 mmol/L   Hemoglobin 10.5 (*) 13.0 - 17.0 g/dL   HCT 40.9 (*) 81.1 - 91.4 %   A/P: Pt with hx smoking, COPD, wt loss, weakness, poor appetite and 5 cm RUL lung mass noted on recent CT chest. Plan is for CT guided biopsy of the rt lung mass on 5/14. Details/risks of procedure d/w pt's family with their understanding and consent.

## 2013-03-14 NOTE — Assessment & Plan Note (Signed)
I called wife this am and asked her to take [name] to [facility] ER for admission. She agreed... Labs, CXR discussed

## 2013-03-14 NOTE — Assessment & Plan Note (Signed)
Will need IV abx. He is s/p laser procedures on his esophagus

## 2013-03-14 NOTE — ED Notes (Signed)
Pt c/o of fever and weakness x 1 week.  Pt sts his PCP called this AM and told him to come to the ED.  Sts he was diagnosed with Pneumonia after having a chest x-ray yesterday.  Pt sts he has also been falling frequently and had an MRI yesterday.  He has not received the MRI results.  Vitals are stable.

## 2013-03-14 NOTE — Progress Notes (Signed)
Utilization  Review completed.    Jamarius Saha,RN,BSN   Care Management.    

## 2013-03-14 NOTE — Care Management Note (Addendum)
    Page 1 of 1   03/17/2013     2:57:59 PM   CARE MANAGEMENT NOTE 03/17/2013  Patient:  [name], [name]   Account Number:  [id number]  Date Initiated:  03/14/2013  Documentation initiated by:  Lanier Clam  Subjective/Objective Assessment:   ADMITTED W/WEAKNESS,FALLS,WT LOSS.R UPPER LOBE MASS.     Action/Plan:   FROM HOME W/SPOUSE.HAS PCP,PHARMACY.   Anticipated DC Date:  03/20/2013   Anticipated DC Plan:  HOME W HOME HEALTH SERVICES      DC Planning Services  CM consult      Choice offered to / List presented to:             Status of service:  In process, will continue to follow Medicare Important Message given?   (If response is "NO", the following Medicare IM given date fields will be blank) Date Medicare IM given:   Date Additional Medicare IM given:    Discharge Disposition:    Per UR Regulation:  Reviewed for med. necessity/level of care/duration of stay  If discussed at Long Length of Stay Meetings, dates discussed:    Comments:  03/17/13 Harlyn Italiano RN,BSN NCM [phone] PULMONARY/ONC FOLLOWING.LUNG MASS,WBC ELEVATED,?ABX/OPEN BX LUNG.[facility] ALREADY FOLLOWING-HHPT/OT,RW.  03/16/13 Aliyah Abeyta RN,BSN NCM [phone] [facility] CHOSEN FOR HH,KRISTEN REP NOTIFIED,& FOLLOWING.RECOMMENDED FOR HHPT/OT,RW.  03/15/13 Brittie Whisnant RN,BSN NCM [phone] BX TODAY-R LUNG MASS.  03/14/13 Kamaron Deskins RN,BSN NCM [phone] IR CONS.

## 2013-03-14 NOTE — H&P (Signed)
[facility] History and Physical  [name] WUJ:[id number] DOB: 1940/12/20 DOA: 03/14/2013   PCP: Sonda Primes, MD  Specialists: He is followed by Dr. Leone Payor gastroenterology. He's also followed by a gastroenterologist in [place], Dr. Margaretha Glassing. Dr. Elvis Coil is his nephrologist. He has been followed by [facility] cardiology. And is followed by vascular surgery for AAA  Chief Complaint: Weakness, falls, weight loss  HPI: [name] is a 72 y.o. male with a past medical history of coronary artery disease, Barrett's esophagus, type 2 diabetes, hypertension, AAA, who is a smoker, who was in his usual state of health about 2 months ago, when he had started falling a lot. Patient is accompanied by his wife, who provides most of the history. Patient is very taciturn. He's lost about 30 pounds in the last one month. 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 The steroids were taken off on Friday. Patient has been very weak. He has been falling quite a bit. Wife states that patient at times feels that he passes out. He actually went to [facility] yesterday and underwent MRI, which did not show any acute or concerning findings in the brain (report is in paper chart). Patient denies any head injuries. He hasn't had any falls since yesterday. Has been very weak. Denies any nausea, vomiting. Has been having diarrhea on and off due to his colitis. Has had a cough, but denies any blood in the sputum. No fever. No chills. Denies any shortness of breath, but he smokes one pack of cigarettes on a daily basis. He's had very poor oral intake in the last couple of months. Yesterday, he went to his PCPs office and had blood work, and chest x-ray. Chest x-ray raised the possibility of a pneumonia on the right lung. So he was referred for admission. CT was done  in the ED, which actually revealed a right upper lobe mass.  Home Medications: Prior to Admission medications   Medication Sig Start Date End Date Taking? Authorizing Provider  ALPRAZolam Prudy Feeler) 0.5 MG tablet Take 1 tablet (0.5 mg total) by mouth 2 (two) times daily as needed for sleep. 02/02/13  Yes Georgina Quint Plotnikov, MD  aspirin EC 81 MG tablet Take 81 mg by mouth every evening.   Yes Historical Provider, MD  atorvastatin (LIPITOR) 40 MG tablet Take 40 mg by mouth every morning.   Yes Historical Provider, MD  Budesonide 9 MG TB24 Take 9 mg by mouth daily. 03/03/13  Yes Iva Boop, MD  busPIRone (BUSPAR) 15 MG tablet TAKE 1/2 TABLET BY MOUTH TWICE A DAY 03/04/13  Yes Tresa Garter, MD  Cholecalciferol 1000 UNITS tablet Take 1,000 Units by mouth daily.     Yes Historical Provider, MD  cloNIDine (CATAPRES) 0.1 MG tablet Take 0.1 mg by mouth 2 (two) times daily.   Yes Historical Provider, MD  diphenoxylate-atropine (LOMOTIL) 2.5-0.025 MG per tablet Take 1 tablet by mouth 4 (four) times daily as needed for diarrhea or loose stools. 02/02/13  Yes Georgina Quint Plotnikov, MD  gabapentin (NEURONTIN) 300 MG capsule Take 300 mg by mouth 3 (three) times daily.   Yes Historical Provider, MD  glimepiride (AMARYL) 1 MG tablet Take 1 mg by mouth daily before breakfast.   Yes Historical Provider, MD  labetalol (NORMODYNE) 300 MG tablet Take 300 mg by mouth 2 (  two) times daily. 09/12/12  Yes Georgina Quint Plotnikov, MD  nortriptyline (PAMELOR) 10 MG capsule Take 20 mg by mouth at bedtime.   Yes Historical Provider, MD  omeprazole (PRILOSEC) 40 MG capsule Take 80 mg by mouth daily.   Yes Historical Provider, MD  PARoxetine (PAXIL) 20 MG tablet Take 20 mg by mouth 2 (two) times daily.    Yes Historical Provider, MD  promethazine (PHENERGAN) 12.5 MG tablet Take 1-2 tablets (12.5-25 mg total) by mouth every 8 (eight) hours as needed for nausea. 02/10/13  Yes Georgina Quint Plotnikov, MD  sodium bicarbonate 650 MG tablet  Take 1,300 mg by mouth daily.     Yes Historical Provider, MD  temazepam (RESTORIL) 15 MG capsule Take 1-2 capsules (15-30 mg total) by mouth at bedtime as needed for sleep. 02/02/13  Yes Tresa Garter, MD    Allergies:  Allergies  Allergen Reactions  . Tramadol Nausea And Vomiting  . Amlodipine Besylate     REACTION: swelling  . Benazepril Hcl     REACTION: elev K  . Penicillins     REACTION: rash, itching  . Risperidone     REACTION: loss of motor skills  . Valsartan     REACTION: elev K    Past Medical History: Past Medical History  Diagnosis Date  . Diabetes mellitus   . Hypertension   . Anxiety   . COPD (chronic obstructive pulmonary disease)   . Hyperlipemia   . GERD (gastroesophageal reflux disease)   . Barrett's esophagus with high grade dysplasia     s/p RFA  . PVD (peripheral vascular disease)   . CAD (coronary artery disease)   . Tobacco user   . Depression   . Hiatal hernia   . Myocardial infarction 12/1996  . AAA (abdominal aortic aneurysm)   . Renal insufficiency 2010    Past Surgical History  Procedure Laterality Date  . Tonsillectomy    . Nissen fundoplication    . Abdominal aortic aneurysm repair  2010    and right common iliac artery aneurysm, DR HAYES  . Upper gastrointestinal endoscopy  07/07/2011    barretts esophagus, hiatal hernia, gastritis, ablations in esophagus  . Colonoscopy  05/07/2005    diverticulosis, internal and external hemorrhoids  . Abdominal aortic aneurysm repair    . Radiofrequency ablation  multiple    Barrett's, high-grade dysplasia  . Colonoscopy N/A 03/01/2013    Procedure: COLONOSCOPY;  Surgeon: Iva Boop, MD;  Location: [facility];  Service: Endoscopy;  Laterality: N/A;    Social History:  reports that he has been smoking Cigarettes.  He has a 54 pack-year smoking history. He has never used smokeless tobacco. He reports that he does not drink alcohol or use illicit drugs.  Living Situation: He lives with  his wife in [place] Activity Level: Usually independent, but in the last couple months has been very weak   Family History:  Family History  Problem Relation Age of Onset  . Coronary artery disease Other   . Diabetes Other   . Asthma Other   . Mental illness Mother     dementia  . Cancer Mother   . Diabetes Mother   . Heart disease Father   . Diabetes Father   . Stomach cancer Father   . Cancer Father   . Colon cancer Neg Hx   . Esophageal cancer Neg Hx      Review of Systems - Unable to do as patient was not cooperative  Physical Examination  Filed Vitals:   03/14/13 0918  BP: 155/84  Temp: 99.8 F (37.7 C)  TempSrc: Rectal  Resp: 21  SpO2: 96%    General appearance: alert, appears stated age, fatigued and no distress Head: Normocephalic, without obvious abnormality, atraumatic Eyes: conjunctivae/corneas clear. PERRL, EOM's intact.  Throat: Dry mucus membranes Neck: no adenopathy, no carotid bruit, no JVD, supple, symmetrical, trachea midline and thyroid not enlarged, symmetric, no tenderness/mass/nodules Back: symmetric, no curvature. ROM normal. No CVA tenderness. Resp: Decreased air entry right lung at apex but no crackles or wheezing. Cardio: regular rate and rhythm, S1, S2 normal, no murmur, click, rub or gallop GI: soft, non-tender; bowel sounds normal; no masses,  no organomegaly Extremities: extremities normal, atraumatic, no cyanosis or edema Pulses: 2+ and symmetric Skin: Skin color, texture, turgor normal. No rashes or lesions Lymph nodes: Cervical, supraclavicular, and axillary nodes normal. Neurologic: Alert and oriented x 3. Cranial nerves intact. No obvious motor deficits. Able to lift legs off bed. Gait not assessed.  Laboratory Data: Today's blood work is in paper chart  Results for orders placed in visit on 03/13/13 (from the past 48 hour(s))  TSH     Status: None   Collection Time    03/13/13  1:56 PM      Result Value Range   TSH 2.24   0.35 - 5.50 uIU/mL  SEDIMENTATION RATE     Status: Abnormal   Collection Time    03/13/13  1:56 PM      Result Value Range   Sed Rate 115 (*) 0 - 22 mm/hr  HEPATIC FUNCTION PANEL     Status: Abnormal   Collection Time    03/13/13  1:56 PM      Result Value Range   Total Bilirubin 0.7  0.3 - 1.2 mg/dL   Bilirubin, Direct 0.1  0.0 - 0.3 mg/dL   Alkaline Phosphatase 209 (*) 39 - 117 U/L   AST 82 (*) 0 - 37 U/L   ALT 138 (*) 0 - 53 U/L   Total Protein 8.8 (*) 6.0 - 8.3 g/dL   Albumin 3.1 (*) 3.5 - 5.2 g/dL  BASIC METABOLIC PANEL     Status: Abnormal   Collection Time    03/13/13  1:56 PM      Result Value Range   Sodium 134 (*) 135 - 145 mEq/L   Potassium 4.6  3.5 - 5.1 mEq/L   Chloride 99  96 - 112 mEq/L   CO2 17 (*) 19 - 32 mEq/L   Glucose, Bld 130 (*) 70 - 99 mg/dL   BUN 64 (*) 6 - 23 mg/dL   Creatinine, Ser 3.6 (*) 0.4 - 1.5 mg/dL   Calcium 8.3 (*) 8.4 - 10.5 mg/dL   GFR 82.95 (*) >62.13 mL/min  CBC WITH DIFFERENTIAL     Status: Abnormal   Collection Time    03/13/13  1:56 PM      Result Value Range   WBC 24.1 Repeated and verified X2. (*) 4.5 - 10.5 K/uL   RBC 3.54 (*) 4.22 - 5.81 Mil/uL   Hemoglobin 10.6 (*) 13.0 - 17.0 g/dL   HCT 08.6 (*) 57.8 - 46.9 %   MCV 88.7  78.0 - 100.0 fl   MCHC 33.7  30.0 - 36.0 g/dL   RDW 62.9  52.8 - 41.3 %   Platelets 371.0  150.0 - 400.0 K/uL   Neutrophils Relative 87.8 Repeated and verified X2. (*) 43.0 - 77.0 %  Lymphocytes Relative 6.0 Repeated and verified X2. (*) 12.0 - 46.0 %   Monocytes Relative 4.4  3.0 - 12.0 %   Eosinophils Relative 1.3  0.0 - 5.0 %   Basophils Relative 0.5  0.0 - 3.0 %   Neutro Abs 21.2 (*) 1.4 - 7.7 K/uL   Lymphs Abs 1.4  0.7 - 4.0 K/uL   Monocytes Absolute 1.1 (*) 0.1 - 1.0 K/uL   Eosinophils Absolute 0.3  0.0 - 0.7 K/uL   Basophils Absolute 0.1  0.0 - 0.1 K/uL  HEMOGLOBIN A1C     Status: None   Collection Time    03/13/13  1:56 PM      Result Value Range   Hemoglobin A1C 6.3  4.6 - 6.5 %    Comment: Glycemic Control Guidelines for People with Diabetes:Non Diabetic:  <6%Goal of Therapy: <7%Additional Action Suggested:  >8%   LIPASE     Status: None   Collection Time    03/13/13  1:56 PM      Result Value Range   Lipase 21.0  11.0 - 59.0 U/L    Radiology Reports: Dg Chest 2 View  03/13/2013  *RADIOLOGY REPORT*  Clinical Data: Cough and fever  CHEST - 2 VIEW  Comparison: Chest radiograph 12/04/2010  Findings: Normal cardiac silhouette.  There is a rounded mass within the right upper lobe abutting the pleural surface measuring up to 7.5 cm.  Lungs are hyperinflated.  prominent nipple shadow noted on the left.  Aortic stent graft noted on the lateral projection in the infrarenal abdominal aorta.  IMPRESSION:  Peripheral mass in the right upper lobe is concerning for neoplasm versus pneumonia.  Recommend CT thorax with contrast for further evaluation.  These results will be called to the ordering clinician or representative by the Radiologist Assistant, and communication documented in the PACS Dashboard.   Original Report Authenticated By: Genevive Bi, M.D.    Ct Chest Wo Contrast  03/14/2013  *RADIOLOGY REPORT*  Clinical Data: Fever and weakness for 1 week.  Diagnosed with pneumonia 1 day ago.  CT CHEST WITHOUT CONTRAST  Technique:  Multidetector CT imaging of the chest was performed following the standard protocol without IV contrast.  Comparison: PA and lateral chest 12/04/2010 and 03/13/2013.  Findings: No pleural or pericardial effusion is identified.  There is no axillary, hilar or mediastinal lymphadenopathy.  Calcific coronary and aortic atherosclerosis is noted.  Heart size is upper normal.  The patient has a very small hiatal hernia.  There is a mass in the periphery of the posterior segment of the right upper lobe.  The lesion measures 5.4 cm cranial-caudal by 4.7 cm AP by 4.1 cm transverse.  There is an area of central necrosis within the lesion.  The mass is contiguous with the  pleural of the right chest wall.  The lungs are otherwise unremarkable.  Incidentally imaged upper abdomen demonstrates surgical clips in the central aspect of the upper abdomen as seen on prior study. Visualized intra-abdominal contents are otherwise unremarkable.  No focal bony abnormality is identified.  IMPRESSION:  1.  Right upper lobe mass contiguous with the pleura of the periphery of the posterior right upper lobe is highly suspicious for carcinoma.  No lymphadenopathy or other evidence of metastatic disease identified. 2.  Calcific coronary aortic atherosclerosis. 3.  Very small hiatal hernia.   Original Report Authenticated By: Holley Dexter, M.D.     Electrocardiogram: Pending  Problem List  Principal Problem:   Lung mass Active Problems:  TOBACCO USE DISORDER/SMOKER-SMOKING CESSATION DISCUSSED   AAA   COPD   Barrett's esophagus with high grade dysplasia   Frequent falls   CKD (chronic kidney disease) stage 3, GFR 30-59 ml/min   Dehydration   Leukocytosis   Syncope   Transaminitis   Assessment: This is a 72 year old, Caucasian male, who presents with weakness, weight loss, poor appetite, frequent falls and is found to have a right lung mass. He likely has the lung cancer. This would explain his weight loss and weakness. Unclear why he has frequent falls. He doesn't have any focal neurological deficits. Could again be due to poor intake and malnutrition. There is a questionable syncopal episode. Patient denies any chest pain at this time. He also has leukocytosis however, there is no clear source of infection. Urine analysis is pending.  Plan: #1 right lung mass: [facility] consult interventional radiology. Hopefully, he can have the biopsy tomorrow. Once results are back he will need to be seen by oncology.  #2 abnormal LFTs: Very concerning for liver involvement. We'll obtain a CT of his abdomen Pelvis without contrast. LFTs will be trended.  #3 leukocytosis: Most likely due  to the budesonide that he has been on for his colitis. We will check a UA. CT does not show any infiltrates. Finding of pneumonia seen on CXR was actually the mass. Hold off on antibiotics for now. Will get blood cultures.  #4 possible syncope: Monitor him on telemetry. Get EKG, and troponin.  #5 frequent falls: MRI of the brain, done at [facility] did not show any concerning intracranial findings. Will have PT and OT evaluate. Will check B12 levels. He does not have any back pain nor any neuro deficits. Do not suspect any spinal process at this time.  #6 history of,diabetes, type II: Sliding scale insulin will be ordered. HbA1c was 6.3 on 5/12.   #7 history of coronary artery disease, status post stent in the past: Appears to be stable. Follow up on EKG. Continue aspirin post procedure.  #8 history of AAA status post stent in past: Stable. CT abdomen will give more information.  #9 chronic kidney disease: Renal function appears to be close to baseline although BUN is more elevated. He had blood work in the ED, but it has not crossed over yet. In his blood work, his BUN was found to be 71. Will give him gentle IV hydration.  #10 Tobacco abuse: Counseling will need to be provided. Nicotine patch will need to be utilized.  #11 History of Barrett's esophagus, and recent colitis and colonsoscopy: Continue to monitor for now. He hasn't taken budesonide since Friday per his doctor's advice  DVT Prophylaxis: SCDs for now Code Status: Full code for now Family Communication: Discussed with the patient and his wife  Disposition Plan: Admit to telemetry for now   Further management decisions will depend on results of further testing and patient's response to treatment.  [facility]  [facility] Pager [phone]  If 7PM-7AM, please contact night-coverage [email or web address] Password [email or web address]  03/14/2013, 1:06 PM

## 2013-03-14 NOTE — Assessment & Plan Note (Addendum)
5/14 - worse - likely due to PNA Will need IV abx. He is s/p laser procedures on his esophagus

## 2013-03-14 NOTE — ED Provider Notes (Signed)
History     CSN: [id number]  Arrival date & time 03/14/13  8295   First MD Initiated Contact with Patient 03/14/13 [phone]      Chief Complaint  Patient presents with  . Fever  . Weakness  . Pneumonia    (Consider location/radiation/quality/duration/timing/severity/associated sxs/prior treatment) HPI Complains of cough fever and generalized weakness for approximately one week. Patient also has been falling for several weeks. Seen by Dr.Plotnikiv in the office yesterday had chest x-ray performed and was told to come to the emergency department this morning for admission. Patient also had outpatient MRI of scan of his brain performed yesterday as [facility], and is currently awaiting result. No treatment prior to coming here. No vomiting admits to increased generalized weakness . No treatment prior to coming here nothing makes symptoms better or worse Past Medical History  Diagnosis Date  . Diabetes mellitus   . Hypertension   . Anxiety   . COPD (chronic obstructive pulmonary disease)   . Hyperlipemia   . GERD (gastroesophageal reflux disease)   . Barrett's esophagus with high grade dysplasia     s/p RFA  . PVD (peripheral vascular disease)   . CAD (coronary artery disease)   . Tobacco user   . Depression   . Hiatal hernia   . Myocardial infarction 12/1996  . AAA (abdominal aortic aneurysm)   . Renal insufficiency 2010    Past Surgical History  Procedure Laterality Date  . Tonsillectomy    . Nissen fundoplication    . Abdominal aortic aneurysm repair  2010    and right common iliac artery aneurysm, DR HAYES  . Upper gastrointestinal endoscopy  07/07/2011    barretts esophagus, hiatal hernia, gastritis, ablations in esophagus  . Colonoscopy  05/07/2005    diverticulosis, internal and external hemorrhoids  . Abdominal aortic aneurysm repair    . Radiofrequency ablation  multiple    Barrett's, high-grade dysplasia  . Colonoscopy N/A 03/01/2013    Procedure: COLONOSCOPY;  Surgeon:  Iva Boop, MD;  Location: [facility];  Service: Endoscopy;  Laterality: N/A;    Family History  Problem Relation Age of Onset  . Coronary artery disease Other   . Diabetes Other   . Asthma Other   . Mental illness Mother     dementia  . Cancer Mother   . Diabetes Mother   . Heart disease Father   . Diabetes Father   . Stomach cancer Father   . Cancer Father   . Colon cancer Neg Hx   . Esophageal cancer Neg Hx     History  Substance Use Topics  . Smoking status: Current Every Day Smoker -- 1.00 packs/day for 54 years    Types: Cigarettes  . Smokeless tobacco: Never Used  . Alcohol Use: No      Review of Systems  Constitutional: Positive for fever and fatigue.  HENT: Negative.   Respiratory: Positive for cough.   Cardiovascular: Negative.   Gastrointestinal: Negative.   Musculoskeletal: Negative.   Skin: Negative.   Neurological: Negative.        Difficulty with balance  Psychiatric/Behavioral: Negative.   All other systems reviewed and are negative.    Allergies  Tramadol; Amlodipine besylate; Benazepril hcl; Penicillins; Risperidone; and Valsartan  Home Medications   Current Outpatient Rx  Name  Route  Sig  Dispense  Refill  . ALPRAZolam (XANAX) 0.5 MG tablet   Oral   Take 1 tablet (0.5 mg total) by mouth 2 (two) times daily  as needed for sleep.   60 tablet   5   . aspirin EC 81 MG tablet   Oral   Take 81 mg by mouth every evening.         Marland Kitchen atorvastatin (LIPITOR) 40 MG tablet   Oral   Take 40 mg by mouth every morning.         . Budesonide 9 MG TB24   Oral   Take 9 mg by mouth daily.   30 tablet   1   . busPIRone (BUSPAR) 15 MG tablet      TAKE 1/2 TABLET BY MOUTH TWICE A DAY   60 tablet   2   . Cholecalciferol 1000 UNITS tablet   Oral   Take 1,000 Units by mouth daily.           . cloNIDine (CATAPRES) 0.1 MG tablet   Oral   Take 0.1 mg by mouth 2 (two) times daily.         . diphenoxylate-atropine (LOMOTIL)  2.5-0.025 MG per tablet   Oral   Take 1 tablet by mouth 4 (four) times daily as needed for diarrhea or loose stools.   60 tablet   1   . gabapentin (NEURONTIN) 300 MG capsule   Oral   Take 300 mg by mouth 3 (three) times daily.         Marland Kitchen glimepiride (AMARYL) 1 MG tablet   Oral   Take 1 mg by mouth daily before breakfast.         . labetalol (NORMODYNE) 300 MG tablet   Oral   Take 1 tablet (300 mg total) by mouth 3 (three) times daily.   90 tablet   11   . nortriptyline (PAMELOR) 10 MG capsule   Oral   Take 20 mg by mouth at bedtime.         Marland Kitchen omeprazole (PRILOSEC) 40 MG capsule   Oral   Take 80 mg by mouth daily.         Marland Kitchen PARoxetine (PAXIL) 20 MG tablet   Oral   Take 20 mg by mouth 2 (two) times daily.          . promethazine (PHENERGAN) 12.5 MG tablet   Oral   Take 1-2 tablets (12.5-25 mg total) by mouth every 8 (eight) hours as needed for nausea.   60 tablet   0   . sodium bicarbonate 650 MG tablet   Oral   Take 1,300 mg by mouth daily.           . temazepam (RESTORIL) 15 MG capsule   Oral   Take 1-2 capsules (15-30 mg total) by mouth at bedtime as needed for sleep.   60 capsule   5     X   . triamterene-hydrochlorothiazide (MAXZIDE-25) 37.5-25 MG per tablet   Oral   Take 1 tablet by mouth daily.           BP 155/84  Temp(Src) 99.8 F (37.7 C) (Rectal)  Resp 21  SpO2 96%  Physical Exam  Nursing note and vitals reviewed. Constitutional: He is oriented to person, place, and time.  Chronically ill-appearing  HENT:  Head: Normocephalic and atraumatic.  Eyes: Conjunctivae are normal. Pupils are equal, round, and reactive to light.  Neck: Neck supple. No tracheal deviation present. No thyromegaly present.  Cardiovascular: Normal rate and regular rhythm.   No murmur heard. Pulmonary/Chest: Effort normal and breath sounds normal.  Abdominal: Soft. Bowel sounds are normal.  He exhibits no distension. There is no tenderness.   Musculoskeletal: Normal range of motion. He exhibits no edema and no tenderness.  Neurological: He is alert and oriented to person, place, and time. Coordination normal.  Skin: Skin is warm and dry. No rash noted.  Psychiatric: He has a normal mood and affect.    ED Course  Procedures (including critical care time)  Labs Reviewed - No data to display Dg Chest 2 View  03/13/2013  *RADIOLOGY REPORT*  Clinical Data: Cough and fever  CHEST - 2 VIEW  Comparison: Chest radiograph 12/04/2010  Findings: Normal cardiac silhouette.  There is a rounded mass within the right upper lobe abutting the pleural surface measuring up to 7.5 cm.  Lungs are hyperinflated.  prominent nipple shadow noted on the left.  Aortic stent graft noted on the lateral projection in the infrarenal abdominal aorta.  IMPRESSION:  Peripheral mass in the right upper lobe is concerning for neoplasm versus pneumonia.  Recommend CT thorax with contrast for further evaluation.  These results will be called to the ordering clinician or representative by the Radiologist Assistant, and communication documented in the PACS Dashboard.   Original Report Authenticated By: Genevive Bi, M.D.      No diagnosis found.  MRI result from yesterday obtained via fax report showing moderate nonspecific hemispheric and mild brainstem changes, most commonly secondary to chronic microvascular disease in a patient of this age. Mild global volume loss. Otherwise negative study without acute findings Lactate level 0.99; i-STAT 8 remarkable for BUN 71 creatinine 3.3 consistent with renal insufficiency CBC remarkable for hemoglobin 9.4 white cell count 21.6 otherwise normal consistent with anemia and leukocytosis Results for orders placed in visit on 03/13/13  TSH      Result Value Range   TSH 2.24  0.35 - 5.50 uIU/mL  SEDIMENTATION RATE      Result Value Range   Sed Rate 115 (*) 0 - 22 mm/hr  HEPATIC FUNCTION PANEL      Result Value Range   Total  Bilirubin 0.7  0.3 - 1.2 mg/dL   Bilirubin, Direct 0.1  0.0 - 0.3 mg/dL   Alkaline Phosphatase 209 (*) 39 - 117 U/L   AST 82 (*) 0 - 37 U/L   ALT 138 (*) 0 - 53 U/L   Total Protein 8.8 (*) 6.0 - 8.3 g/dL   Albumin 3.1 (*) 3.5 - 5.2 g/dL  BASIC METABOLIC PANEL      Result Value Range   Sodium 134 (*) 135 - 145 mEq/L   Potassium 4.6  3.5 - 5.1 mEq/L   Chloride 99  96 - 112 mEq/L   CO2 17 (*) 19 - 32 mEq/L   Glucose, Bld 130 (*) 70 - 99 mg/dL   BUN 64 (*) 6 - 23 mg/dL   Creatinine, Ser 3.6 (*) 0.4 - 1.5 mg/dL   Calcium 8.3 (*) 8.4 - 10.5 mg/dL   GFR 16.10 (*) >96.04 mL/min  CBC WITH DIFFERENTIAL      Result Value Range   WBC 24.1 Repeated and verified X2. (*) 4.5 - 10.5 K/uL   RBC 3.54 (*) 4.22 - 5.81 Mil/uL   Hemoglobin 10.6 (*) 13.0 - 17.0 g/dL   HCT 54.0 (*) 98.1 - 19.1 %   MCV 88.7  78.0 - 100.0 fl   MCHC 33.7  30.0 - 36.0 g/dL   RDW 47.8  29.5 - 62.1 %   Platelets 371.0  150.0 - 400.0 K/uL   Neutrophils Relative 87.8  Repeated and verified X2. (*) 43.0 - 77.0 %   Lymphocytes Relative 6.0 Repeated and verified X2. (*) 12.0 - 46.0 %   Monocytes Relative 4.4  3.0 - 12.0 %   Eosinophils Relative 1.3  0.0 - 5.0 %   Basophils Relative 0.5  0.0 - 3.0 %   Neutro Abs 21.2 (*) 1.4 - 7.7 K/uL   Lymphs Abs 1.4  0.7 - 4.0 K/uL   Monocytes Absolute 1.1 (*) 0.1 - 1.0 K/uL   Eosinophils Absolute 0.3  0.0 - 0.7 K/uL   Basophils Absolute 0.1  0.0 - 0.1 K/uL  HEMOGLOBIN A1C      Result Value Range   Hemoglobin A1C 6.3  4.6 - 6.5 %  LIPASE      Result Value Range   Lipase 21.0  11.0 - 59.0 U/L   Dg Chest 2 View  03/13/2013  *RADIOLOGY REPORT*  Clinical Data: Cough and fever  CHEST - 2 VIEW  Comparison: Chest radiograph 12/04/2010  Findings: Normal cardiac silhouette.  There is a rounded mass within the right upper lobe abutting the pleural surface measuring up to 7.5 cm.  Lungs are hyperinflated.  prominent nipple shadow noted on the left.  Aortic stent graft noted on the lateral  projection in the infrarenal abdominal aorta.  IMPRESSION:  Peripheral mass in the right upper lobe is concerning for neoplasm versus pneumonia.  Recommend CT thorax with contrast for further evaluation.  These results will be called to the ordering clinician or representative by the Radiologist Assistant, and communication documented in the PACS Dashboard.   Original Report Authenticated By: Genevive Bi, M.D.    Ct Chest Wo Contrast  03/14/2013  *RADIOLOGY REPORT*  Clinical Data: Fever and weakness for 1 week.  Diagnosed with pneumonia 1 day ago.  CT CHEST WITHOUT CONTRAST  Technique:  Multidetector CT imaging of the chest was performed following the standard protocol without IV contrast.  Comparison: PA and lateral chest 12/04/2010 and 03/13/2013.  Findings: No pleural or pericardial effusion is identified.  There is no axillary, hilar or mediastinal lymphadenopathy.  Calcific coronary and aortic atherosclerosis is noted.  Heart size is upper normal.  The patient has a very small hiatal hernia.  There is a mass in the periphery of the posterior segment of the right upper lobe.  The lesion measures 5.4 cm cranial-caudal by 4.7 cm AP by 4.1 cm transverse.  There is an area of central necrosis within the lesion.  The mass is contiguous with the pleural of the right chest wall.  The lungs are otherwise unremarkable.  Incidentally imaged upper abdomen demonstrates surgical clips in the central aspect of the upper abdomen as seen on prior study. Visualized intra-abdominal contents are otherwise unremarkable.  No focal bony abnormality is identified.  IMPRESSION:  1.  Right upper lobe mass contiguous with the pleura of the periphery of the posterior right upper lobe is highly suspicious for carcinoma.  No lymphadenopathy or other evidence of metastatic disease identified. 2.  Calcific coronary aortic atherosclerosis. 3.  Very small hiatal hernia.   Original Report Authenticated By: Holley Dexter, M.D.     Results for orders placed in visit on 03/13/13  TSH      Result Value Range   TSH 2.24  0.35 - 5.50 uIU/mL  SEDIMENTATION RATE      Result Value Range   Sed Rate 115 (*) 0 - 22 mm/hr  HEPATIC FUNCTION PANEL      Result Value Range  Total Bilirubin 0.7  0.3 - 1.2 mg/dL   Bilirubin, Direct 0.1  0.0 - 0.3 mg/dL   Alkaline Phosphatase 209 (*) 39 - 117 U/L   AST 82 (*) 0 - 37 U/L   ALT 138 (*) 0 - 53 U/L   Total Protein 8.8 (*) 6.0 - 8.3 g/dL   Albumin 3.1 (*) 3.5 - 5.2 g/dL  BASIC METABOLIC PANEL      Result Value Range   Sodium 134 (*) 135 - 145 mEq/L   Potassium 4.6  3.5 - 5.1 mEq/L   Chloride 99  96 - 112 mEq/L   CO2 17 (*) 19 - 32 mEq/L   Glucose, Bld 130 (*) 70 - 99 mg/dL   BUN 64 (*) 6 - 23 mg/dL   Creatinine, Ser 3.6 (*) 0.4 - 1.5 mg/dL   Calcium 8.3 (*) 8.4 - 10.5 mg/dL   GFR 66.44 (*) >03.47 mL/min  CBC WITH DIFFERENTIAL      Result Value Range   WBC 24.1 Repeated and verified X2. (*) 4.5 - 10.5 K/uL   RBC 3.54 (*) 4.22 - 5.81 Mil/uL   Hemoglobin 10.6 (*) 13.0 - 17.0 g/dL   HCT 42.5 (*) 95.6 - 38.7 %   MCV 88.7  78.0 - 100.0 fl   MCHC 33.7  30.0 - 36.0 g/dL   RDW 56.4  33.2 - 95.1 %   Platelets 371.0  150.0 - 400.0 K/uL   Neutrophils Relative 87.8 Repeated and verified X2. (*) 43.0 - 77.0 %   Lymphocytes Relative 6.0 Repeated and verified X2. (*) 12.0 - 46.0 %   Monocytes Relative 4.4  3.0 - 12.0 %   Eosinophils Relative 1.3  0.0 - 5.0 %   Basophils Relative 0.5  0.0 - 3.0 %   Neutro Abs 21.2 (*) 1.4 - 7.7 K/uL   Lymphs Abs 1.4  0.7 - 4.0 K/uL   Monocytes Absolute 1.1 (*) 0.1 - 1.0 K/uL   Eosinophils Absolute 0.3  0.0 - 0.7 K/uL   Basophils Absolute 0.1  0.0 - 0.1 K/uL  HEMOGLOBIN A1C      Result Value Range   Hemoglobin A1C 6.3  4.6 - 6.5 %  LIPASE      Result Value Range   Lipase 21.0  11.0 - 59.0 U/L   Dg Chest 2 View  03/13/2013  *RADIOLOGY REPORT*  Clinical Data: Cough and fever  CHEST - 2 VIEW  Comparison: Chest radiograph 12/04/2010  Findings:  Normal cardiac silhouette.  There is a rounded mass within the right upper lobe abutting the pleural surface measuring up to 7.5 cm.  Lungs are hyperinflated.  prominent nipple shadow noted on the left.  Aortic stent graft noted on the lateral projection in the infrarenal abdominal aorta.  IMPRESSION:  Peripheral mass in the right upper lobe is concerning for neoplasm versus pneumonia.  Recommend CT thorax with contrast for further evaluation.  These results will be called to the ordering clinician or representative by the Radiologist Assistant, and communication documented in the PACS Dashboard.   Original Report Authenticated By: Genevive Bi, M.D.    Ct Chest Wo Contrast  03/14/2013  *RADIOLOGY REPORT*  Clinical Data: Fever and weakness for 1 week.  Diagnosed with pneumonia 1 day ago.  CT CHEST WITHOUT CONTRAST  Technique:  Multidetector CT imaging of the chest was performed following the standard protocol without IV contrast.  Comparison: PA and lateral chest 12/04/2010 and 03/13/2013.  Findings: No pleural or pericardial effusion is identified.  There is no axillary, hilar or mediastinal lymphadenopathy.  Calcific coronary and aortic atherosclerosis is noted.  Heart size is upper normal.  The patient has a very small hiatal hernia.  There is a mass in the periphery of the posterior segment of the right upper lobe.  The lesion measures 5.4 cm cranial-caudal by 4.7 cm AP by 4.1 cm transverse.  There is an area of central necrosis within the lesion.  The mass is contiguous with the pleural of the right chest wall.  The lungs are otherwise unremarkable.  Incidentally imaged upper abdomen demonstrates surgical clips in the central aspect of the upper abdomen as seen on prior study. Visualized intra-abdominal contents are otherwise unremarkable.  No focal bony abnormality is identified.  IMPRESSION:  1.  Right upper lobe mass contiguous with the pleura of the periphery of the posterior right upper lobe is  highly suspicious for carcinoma.  No lymphadenopathy or other evidence of metastatic disease identified. 2.  Calcific coronary aortic atherosclerosis. 3.  Very small hiatal hernia.   Original Report Authenticated By: Holley Dexter, M.D.     MDM  CT scan of chest or without contrast. No intravenous contrast ordered due to renal insufficiency. Spoke with Dr.G. Rito Ehrlich plan admit to medical surgical floor Dr.Krishnan will plan for biopsy of lung mass with interventional radiology Diagnosis #1 lung mass #2 renal insufficiency #3 leukocytosis         Doug Sou, MD 03/14/13 1210

## 2013-03-15 ENCOUNTER — Inpatient Hospital Stay (HOSPITAL_COMMUNITY): Payer: Medicare Other

## 2013-03-15 ENCOUNTER — Encounter (HOSPITAL_COMMUNITY): Payer: Self-pay | Admitting: Radiology

## 2013-03-15 DIAGNOSIS — N179 Acute kidney failure, unspecified: Secondary | ICD-10-CM | POA: Diagnosis present

## 2013-03-15 DIAGNOSIS — E872 Acidosis, unspecified: Secondary | ICD-10-CM | POA: Diagnosis present

## 2013-03-15 DIAGNOSIS — N189 Chronic kidney disease, unspecified: Secondary | ICD-10-CM

## 2013-03-15 LAB — BLOOD GAS, VENOUS
Acid-base deficit: 7.8 mmol/L — ABNORMAL HIGH (ref 0.0–2.0)
O2 Saturation: 57.6 %
Patient temperature: 99.8
TCO2: 16.3 mmol/L (ref 0–100)
pCO2, Ven: 36 mmHg — ABNORMAL LOW (ref 45.0–50.0)

## 2013-03-15 LAB — APTT: aPTT: 47 seconds — ABNORMAL HIGH (ref 24–37)

## 2013-03-15 LAB — PROTIME-INR: Prothrombin Time: 15.6 seconds — ABNORMAL HIGH (ref 11.6–15.2)

## 2013-03-15 LAB — FERRITIN: Ferritin: 343 ng/mL — ABNORMAL HIGH (ref 22–322)

## 2013-03-15 LAB — IRON AND TIBC
Iron: 18 ug/dL — ABNORMAL LOW (ref 42–135)
TIBC: 224 ug/dL (ref 215–435)

## 2013-03-15 LAB — CBC
HCT: 29.3 % — ABNORMAL LOW (ref 39.0–52.0)
MCV: 90.4 fL (ref 78.0–100.0)
Platelets: 357 10*3/uL (ref 150–400)
RBC: 3.24 MIL/uL — ABNORMAL LOW (ref 4.22–5.81)
WBC: 20.6 10*3/uL — ABNORMAL HIGH (ref 4.0–10.5)

## 2013-03-15 LAB — COMPREHENSIVE METABOLIC PANEL
Albumin: 3 g/dL — ABNORMAL LOW (ref 3.5–5.2)
BUN: 66 mg/dL — ABNORMAL HIGH (ref 6–23)
Creatinine, Ser: 3.07 mg/dL — ABNORMAL HIGH (ref 0.50–1.35)
GFR calc Af Amer: 22 mL/min — ABNORMAL LOW (ref >90)
Glucose, Bld: 115 mg/dL — ABNORMAL HIGH (ref 70–99)
Total Bilirubin: 0.3 mg/dL (ref 0.3–1.2)
Total Protein: 7.9 g/dL (ref 6.0–8.3)

## 2013-03-15 LAB — TROPONIN I: Troponin I: <0.3 ng/mL (ref <0.30)

## 2013-03-15 LAB — RETICULOCYTES: Retic Count, Absolute: 32.4 10*3/uL (ref 19.0–186.0)

## 2013-03-15 LAB — FOLATE: Folate: >20 ng/mL

## 2013-03-15 LAB — GLUCOSE, CAPILLARY: Glucose-Capillary: 101 mg/dL — ABNORMAL HIGH (ref 70–99)

## 2013-03-15 MED ORDER — FENTANYL CITRATE 0.05 MG/ML IJ SOLN
INTRAMUSCULAR | Status: AC | PRN
  Administered 2013-03-15: 50 ug via INTRAVENOUS

## 2013-03-15 MED ORDER — TEMAZEPAM 15 MG PO CAPS
15.0000 mg | ORAL_CAPSULE | Freq: Every evening | ORAL | Status: DC | PRN
  Administered 2013-03-15: 15 mg via ORAL
  Filled 2013-03-15: qty 1

## 2013-03-15 MED ORDER — ASPIRIN EC 81 MG PO TBEC
81.0000 mg | DELAYED_RELEASE_TABLET | Freq: Every evening | ORAL | Status: DC
  Administered 2013-03-15 – 2013-03-17 (×3): 81 mg via ORAL
  Filled 2013-03-15 (×4): qty 1

## 2013-03-15 MED ORDER — MIDAZOLAM HCL 2 MG/2ML IJ SOLN
INTRAMUSCULAR | Status: AC | PRN
  Administered 2013-03-15: 1 mg via INTRAVENOUS

## 2013-03-15 MED ORDER — BOOST / RESOURCE BREEZE PO LIQD
1.0000 | Freq: Two times a day (BID) | ORAL | Status: DC
  Administered 2013-03-15 – 2013-03-18 (×4): 1 via ORAL

## 2013-03-15 MED ORDER — NORTRIPTYLINE HCL 10 MG PO CAPS
20.0000 mg | ORAL_CAPSULE | Freq: Every day | ORAL | Status: DC
  Administered 2013-03-15 – 2013-03-17 (×3): 20 mg via ORAL
  Filled 2013-03-15 (×4): qty 2

## 2013-03-15 MED ORDER — BOOST PLUS PO LIQD
237.0000 mL | ORAL | Status: DC
  Administered 2013-03-16 – 2013-03-17 (×2): 237 mL via ORAL
  Filled 2013-03-15 (×3): qty 237

## 2013-03-15 MED ORDER — ADULT MULTIVITAMIN W/MINERALS CH
1.0000 | ORAL_TABLET | Freq: Every day | ORAL | Status: DC
  Administered 2013-03-15 – 2013-03-18 (×4): 1 via ORAL
  Filled 2013-03-15 (×4): qty 1

## 2013-03-15 MED ORDER — GABAPENTIN 300 MG PO CAPS
300.0000 mg | ORAL_CAPSULE | Freq: Three times a day (TID) | ORAL | Status: DC
  Administered 2013-03-15 – 2013-03-18 (×9): 300 mg via ORAL
  Filled 2013-03-15 (×11): qty 1

## 2013-03-15 MED ORDER — FENTANYL CITRATE 0.05 MG/ML IJ SOLN
INTRAMUSCULAR | Status: AC
  Filled 2013-03-15: qty 6

## 2013-03-15 MED ORDER — STERILE WATER FOR INJECTION IV SOLN
INTRAVENOUS | Status: DC
  Administered 2013-03-15 – 2013-03-16 (×2): via INTRAVENOUS
  Filled 2013-03-15 (×3): qty 850

## 2013-03-15 MED ORDER — MIDAZOLAM HCL 2 MG/2ML IJ SOLN
INTRAMUSCULAR | Status: AC
  Filled 2013-03-15: qty 6

## 2013-03-15 NOTE — Progress Notes (Signed)
03/15/13 1400  PT Visit Information  Last PT Received On 03/15/13  Reason Eval/Treat Not Completed Patient at procedure or test/unavailable

## 2013-03-15 NOTE — Progress Notes (Signed)
OT Cancellation Note  Patient Details Name: [name] MRN: [id number] DOB: [birth year]   Cancelled Treatment:     Pt is sleeping and biopsy is pending.  Will check back tomorrow.    Bernece Gall 03/15/2013, 1:24 PM Marica Otter, OTR/L [phone] 03/15/2013

## 2013-03-15 NOTE — Progress Notes (Addendum)
[facility] PROGRESS NOTE  LOIS OSTROM VOZ:[id number] DOB: 12-10-1940 DOA: 03/14/2013 PCP: Sonda Primes, MD Gastroenterology: Dr. Leone Payor & Dr. Margaretha Glassing @ [facility].  Nephrologist: Dr. Elvis Coil Cardiology: [facility] cardiology.  Vascular surgery:   Brief narrative 72 year old male with history of CAD, Barrett's esophagus, type II DM, HTN, AAA, smoker admitted on 03/14/13 with 2 months history of frequent falls, 30 pound weight loss, dry cough, poor appetite, on and off diarrhea diagnosed as lymphocytic colitis by colonoscopy and biopsy, generalized weakness and episodes of passing out. Recent MRI brain at [facility] on 5/12-no acute or concerning findings. Patient went to see his PCP aware chest x-ray suggested pneumonia in right lung and was sent to ED where CT chest revealed right upper lobe mass. Hospitalist admission requested.   Assessment/Plan: 1. Right lung mass/possibly lung cancer: Status post biopsy by IR on 5/14-await pathology results and then consider oncology consultation. Noncontrasted CT abdomen did not show metastatic disease. 2. Metabolic acidosis: Possibly anion and non-anion gap-related to renal failure, intermittent diarrhea. Start on bicarbonate drip and follow daily BMP. 3. Acute on stage III chronic kidney disease: Baseline creatinine probably 2.7. IV fluids and follow BMP. 4. Abnormal LFTs: Unclear etiology. Noncontrasted CT abdomen without metastatic disease. Consider RUQ ultrasound. Check hepatitis panel. 5. Anemia: Chronic disease. Stable. 6. Leukocytosis:? Secondary to steroids for his colitis. Check C. difficile PCR. Hold antibiotics. 7. Possible syncope: Check orthostatic blood pressures. Telemetry: Sinus rhythm. Troponins negative. 8. Frequent falls: MRI brain at [place] to help without concerning intracranial findings. PT and OT evaluation. 9. Type II DM: SSI. A1c 6.3 on 5/12. 10. History of CAD status post PCI: Asymptomatic. Continue aspirin. EKG  5/14 without acute changes. 11. History of AAA status post stent: OP follow up. Increased in size on CT compared to 2012. 12. Tobacco abuse: Cessation counseled. Nicotine patch. 13. History of Barrett's esophagus/recently diagnosed lymphocytic colitis : Recent colonoscopy. Off budesonide since Friday per OP M.D. recommendation.  Code Status:  Full Family Communication:  discussed with spouse at bedside Disposition Plan:  remains inpatient.   Consultants:  Interventional radiology  Procedures:   CT guided biopsy of RUL pulmonary mass the 03/15/13  Antibiotics:   None   HPI/Subjective:  generalized weakness. Denies any other complaints. Per patient and spouse diarrhea seems to have abated approximately a week to 10 days ago.  Objective: Filed Vitals:   03/15/13 1440 03/15/13 1445 03/15/13 1451 03/15/13 1515  BP: 158/90 138/89 159/86 149/78  Pulse: 76 84 82 85  Temp:    97.9 F (36.6 C)  TempSrc:    Oral  Resp: 18 16 25 22   Height:      Weight:      SpO2: 98% 98% 98% 98%    Intake/Output Summary (Last 24 hours) at 03/15/13 1525 Last data filed at 03/15/13 1300  Gross per 24 hour  Intake   1590 ml  Output    325 ml  Net   1265 ml   Filed Weights   03/14/13 1417  Weight: 65.772 kg (145 lb)    Exam:   General exam:  elderly frail male patient, ambulating with assistance. No obvious distress.   Respiratory system: Clear. No increased work of breathing.  Cardiovascular system: S1 & S2 heard, RRR. No JVD, murmurs, gallops, clicks or pedal edema. Telemetry: Sinus rhythm   Gastrointestinal system: Abdomen is nondistended, soft and nontender. Normal bowel sounds heard.  Central nervous system: Alert and oriented. No focal neurological deficits.  Extremities:  Symmetric 5 x 5 power.   Data Reviewed: Basic Metabolic Panel:  Recent Labs Lab 03/13/13 1356 03/14/13 1131 03/15/13 0220  NA 134* 135 133*  K 4.6 3.9 3.9  CL 99 107 97  CO2 17*  --  15*  GLUCOSE  130* 117* 115*  BUN 64* 71* 66*  CREATININE 3.6* 3.30* 3.07*  CALCIUM 8.3*  --  8.3*   Liver Function Tests:  Recent Labs Lab 03/13/13 1356 03/15/13 0220  AST 82* 68*  ALT 138* 115*  ALKPHOS 209* 259*  BILITOT 0.7 0.3  PROT 8.8* 7.9  ALBUMIN 3.1* 3.0*    Recent Labs Lab 03/13/13 1356  LIPASE 21.0   No results found for this basename: AMMONIA,  in the last 168 hours CBC:  Recent Labs Lab 03/13/13 1356 03/14/13 1052 03/14/13 1131 03/15/13 0220  WBC 24.1 Repeated and verified X2.* 21.6*  --  20.6*  NEUTROABS 21.2* 17.5*  --   --   HGB 10.6* 9.4* 10.5* 9.6*  HCT 31.4* 28.2* 31.0* 29.3*  MCV 88.7 90.1  --  90.4  PLT 371.0 403*  --  357   Cardiac Enzymes:  Recent Labs Lab 03/14/13 1455 03/14/13 2027 03/15/13 0220  TROPONINI <0.30 <0.30 <0.30   BNP (last 3 results) No results found for this basename: PROBNP,  in the last 8760 hours CBG:  Recent Labs Lab 03/14/13 1637 03/14/13 2139 03/15/13 0810 03/15/13 1157  GLUCAP 111* 97 101* 121*    Recent Results (from the past 240 hour(s))  CULTURE, BLOOD (ROUTINE X 2)     Status: None   Collection Time    03/14/13  2:55 PM      Result Value Range Status   Specimen Description BLOOD LEFT ARM   Final   Special Requests BOTTLES DRAWN AEROBIC AND ANAEROBIC LEFT HAND   Final   Culture  Setup Time 03/14/2013 22:48   Final   Culture     Final   Value:        BLOOD CULTURE RECEIVED NO GROWTH TO DATE CULTURE WILL BE HELD FOR 5 DAYS BEFORE ISSUING A FINAL NEGATIVE REPORT   Report Status PENDING   Incomplete  CULTURE, BLOOD (ROUTINE X 2)     Status: None   Collection Time    03/14/13  3:08 PM      Result Value Range Status   Specimen Description BLOOD LEFT ARM   Final   Special Requests BOTTLES DRAWN AEROBIC AND ANAEROBIC 10CC   Final   Culture  Setup Time 03/14/2013 22:48   Final   Culture     Final   Value:        BLOOD CULTURE RECEIVED NO GROWTH TO DATE CULTURE WILL BE HELD FOR 5 DAYS BEFORE ISSUING A FINAL  NEGATIVE REPORT   Report Status PENDING   Incomplete     Studies: Ct Abdomen Pelvis Wo Contrast  03/14/2013   *RADIOLOGY REPORT*  Clinical Data: Lung mass, abnormal LFTs  CT ABDOMEN AND PELVIS WITHOUT CONTRAST  Technique:  Multidetector CT imaging of the abdomen and pelvis was performed following the standard protocol without intravenous contrast.  Comparison: 12/03/2010  Findings: Lung bases are clear.  Small hiatal hernia.  Surgical clips near the GE junction.  Unenhanced liver, spleen, pancreas, and right adrenal gland are within normal limits.  Mild nodular thickening of the left adrenal gland.  Possible tiny layering gallstone (series 2/image 22).  No associated inflammatory changes.  Left renal atrophy. Dated bilateral renal cysts measuring  up to 1.8 cm in the left lower pole.  No renal calculi or hydronephrosis.  No evidence of bowel obstruction.  Normal appendix.  4.5 x 4.9 cm infrarenal abdominal aortic aneurysm, previously measuring 3.8 x 3.8 cm, with indwelling aortobi-iliac stent. Specifically, there is increased soft tissue along the left lateral aspect of the aneurysm sac (series 2/image 30).  Please note that an endoleak cannot be excluded on unenhanced CT.  No abdominopelvic ascites.  No suspicious abdominopelvic lymphadenopathy.  Prostate is unremarkable.  Bladder is within normal limits.  Mild degenerative changes of the visualized thoracolumbar spine.  IMPRESSION: No evidence of metastatic disease in the abdomen/pelvis on unenhanced CT. Surgically, the unenhanced liver is grossly unremarkable.  Suspected tiny layering gallstone.  No associated inflammatory changes.  Left renal atrophy.  Suspected bilateral renal cysts.  4.5 x 4.9 cm infrarenal abdominal aortic aneurysm, as described above, increased since 2012 with indwelling aortobi-iliac stent.   Original Report Authenticated By: Charline Bills, M.D.   Ct Chest Wo Contrast  03/14/2013   *RADIOLOGY REPORT*  Clinical Data: Fever and  weakness for 1 week.  Diagnosed with pneumonia 1 day ago.  CT CHEST WITHOUT CONTRAST  Technique:  Multidetector CT imaging of the chest was performed following the standard protocol without IV contrast.  Comparison: PA and lateral chest 12/04/2010 and 03/13/2013.  Findings: No pleural or pericardial effusion is identified.  There is no axillary, hilar or mediastinal lymphadenopathy.  Calcific coronary and aortic atherosclerosis is noted.  Heart size is upper normal.  The patient has a very small hiatal hernia.  There is a mass in the periphery of the posterior segment of the right upper lobe.  The lesion measures 5.4 cm cranial-caudal by 4.7 cm AP by 4.1 cm transverse.  There is an area of central necrosis within the lesion.  The mass is contiguous with the pleural of the right chest wall.  The lungs are otherwise unremarkable.  Incidentally imaged upper abdomen demonstrates surgical clips in the central aspect of the upper abdomen as seen on prior study. Visualized intra-abdominal contents are otherwise unremarkable.  No focal bony abnormality is identified.  IMPRESSION:  1.  Right upper lobe mass contiguous with the pleura of the periphery of the posterior right upper lobe is highly suspicious for carcinoma.  No lymphadenopathy or other evidence of metastatic disease identified. 2.  Calcific coronary aortic atherosclerosis. 3.  Very small hiatal hernia.   Original Report Authenticated By: Holley Dexter, M.D.     Additional labs:   Scheduled Meds: . atorvastatin  40 mg Oral q1800  . busPIRone  7.5 mg Oral BID  . cloNIDine  0.1 mg Oral BID  . fentaNYL      . insulin aspart  0-9 Units Subcutaneous TID WC  . labetalol  300 mg Oral BID  . midazolam      . nicotine  14 mg Transdermal Daily  . pantoprazole  40 mg Oral Daily  . PARoxetine  20 mg Oral BID  . sodium chloride  3 mL Intravenous Q12H   Continuous Infusions: .  sodium bicarbonate 150 mEq in sterile water 1000 mL infusion 75 mL/hr at  03/15/13 1227    Principal Problem:   Lung mass Active Problems:   TOBACCO USE DISORDER/SMOKER-SMOKING CESSATION DISCUSSED   AAA   COPD   Barrett's esophagus with high grade dysplasia   Frequent falls   CKD (chronic kidney disease) stage 3, GFR 30-59 ml/min   Dehydration   Leukocytosis   Syncope  Transaminitis   Metabolic acidosis   Renal failure, acute on chronic    Time spent:  50 minutes    [facility]  [facility] Pager [phone].   If 8PM-8AM, please contact night-coverage at [email or web address], password [facility] 03/15/2013, 3:25 PM  LOS: 1 day

## 2013-03-15 NOTE — Progress Notes (Signed)
INITIAL NUTRITION ASSESSMENT  DOCUMENTATION CODES Per approved criteria  -Severe malnutrition in the context of acute illness or injury  Pt meets criteria for Severe MALNUTRITION in the context of acute illness as evidenced by estimated intake <50% of energy needs for 2 weeks and 10% wt loss in 2 weeks.  INTERVENTION: Provide Boost Plus once daily Provide Resource Breeze BID Provide Multivitamin with minerals daily  NUTRITION DIAGNOSIS: Inadequate oral intake related to poor appetite as evidenced by 10% wt loss in 2 weeks and 18% wt loss in 5 months.   Goal: Pt to meet >/= 90% of their estimated nutrition needs  Monitor:  Po intake Weight Labs  Reason for Assessment: MST  72 y.o. male  Admitting Dx: Lung mass  ASSESSMENT: 72 y.o. male with a past medical history of coronary artery disease, Barrett's esophagus, type 2 diabetes, hypertension, AAA, who is a smoker, who was in his usual state of health about 2 months ago, when he had started falling a lot. CT was done in the ED, which actually revealed a right upper lobe mass. Pt reports that his appetite has been poor for the past 2 weeks and he has only been drinking 1 Boost supplement, water, juice, and Ginger ale but, no solid foods for 2 weeks. Pt reports that he usually weighs between 170 and 175 lbs; pt denies diarrhea at this time but, states he has lost weight in the past due to diarrhea. Pt has been NPO today.  Height: Ht Readings from Last 1 Encounters:  03/14/13 5\' 9"  (1.753 m)    Weight: Wt Readings from Last 1 Encounters:  03/14/13 145 lb (65.772 kg)    Ideal Body Weight: 160 lbs  % Ideal Body Weight: 91%  Wt Readings from Last 10 Encounters:  03/14/13 145 lb (65.772 kg)  03/13/13 149 lb (67.586 kg)  02/27/13 161 lb (73.029 kg)  02/02/13 167 lb (75.751 kg)  10/25/12 174 lb 4 oz (79.039 kg)  10/19/12 176 lb 3.2 oz (79.924 kg)  10/07/12 178 lb 0.6 oz (80.758 kg)  09/19/12 180 lb (81.647 kg)  09/05/12  178 lb (80.74 kg)  08/18/12 180 lb (81.647 kg)    Usual Body Weight: 170-175 lbs  % Usual Body Weight: 83-85%  BMI:  Body mass index is 21.4 kg/(m^2).  Estimated Nutritional Needs: Kcal: 1840-2170 Protein: 98-118 grams Fluid: 2.1 L  Skin: WDL  Diet Order: NPO  EDUCATION NEEDS: -No education needs identified at this time   Intake/Output Summary (Last 24 hours) at 03/15/13 1415 Last data filed at 03/15/13 0859  Gross per 24 hour  Intake   1590 ml  Output    325 ml  Net   1265 ml    Last BM: 5/13  Labs:   Recent Labs Lab 03/13/13 1356 03/14/13 1131 03/15/13 0220  NA 134* 135 133*  K 4.6 3.9 3.9  CL 99 107 97  CO2 17*  --  15*  BUN 64* 71* 66*  CREATININE 3.6* 3.30* 3.07*  CALCIUM 8.3*  --  8.3*  GLUCOSE 130* 117* 115*    CBG (last 3)   Recent Labs  03/14/13 2139 03/15/13 0810 03/15/13 1157  GLUCAP 97 101* 121*    Scheduled Meds: . atorvastatin  40 mg Oral q1800  . busPIRone  7.5 mg Oral BID  . cloNIDine  0.1 mg Oral BID  . fentaNYL      . insulin aspart  0-9 Units Subcutaneous TID WC  . labetalol  300 mg  Oral BID  . midazolam      . nicotine  14 mg Transdermal Daily  . pantoprazole  40 mg Oral Daily  . PARoxetine  20 mg Oral BID  . sodium chloride  3 mL Intravenous Q12H    Continuous Infusions: .  sodium bicarbonate 150 mEq in sterile water 1000 mL infusion 75 mL/hr at 03/15/13 1227    Past Medical History  Diagnosis Date  . Diabetes mellitus   . Hypertension   . Anxiety   . COPD (chronic obstructive pulmonary disease)   . Hyperlipemia   . GERD (gastroesophageal reflux disease)   . Barrett's esophagus with high grade dysplasia     s/p RFA  . PVD (peripheral vascular disease)   . CAD (coronary artery disease)   . Tobacco user   . Depression   . Hiatal hernia   . Myocardial infarction 12/1996  . AAA (abdominal aortic aneurysm)   . Renal insufficiency 2010    Past Surgical History  Procedure Laterality Date  . Tonsillectomy     . Nissen fundoplication    . Abdominal aortic aneurysm repair  2010    and right common iliac artery aneurysm, DR HAYES  . Upper gastrointestinal endoscopy  07/07/2011    barretts esophagus, hiatal hernia, gastritis, ablations in esophagus  . Colonoscopy  05/07/2005    diverticulosis, internal and external hemorrhoids  . Abdominal aortic aneurysm repair    . Radiofrequency ablation  multiple    Barrett's, high-grade dysplasia  . Colonoscopy N/A 03/01/2013    Procedure: COLONOSCOPY;  Surgeon: Iva Boop, MD;  Location: [facility] ENDOSCOPY;  Service: Endoscopy;  Laterality: N/A;    Ian Malkin RD, LDN Inpatient Clinical Dietitian Pager: [phone] After Hours Pager: [phone]

## 2013-03-15 NOTE — Procedures (Signed)
Technically successful CT guided biopsy of right upper lobe pulmonary mass  No immediate post procedural complications.

## 2013-03-16 ENCOUNTER — Inpatient Hospital Stay (HOSPITAL_COMMUNITY): Payer: Medicare Other

## 2013-03-16 DIAGNOSIS — E876 Hypokalemia: Secondary | ICD-10-CM

## 2013-03-16 LAB — COMPREHENSIVE METABOLIC PANEL
ALT: 97 U/L — ABNORMAL HIGH (ref 0–53)
AST: 64 U/L — ABNORMAL HIGH (ref 0–37)
Albumin: 2.5 g/dL — ABNORMAL LOW (ref 3.5–5.2)
Alkaline Phosphatase: 231 U/L — ABNORMAL HIGH (ref 39–117)
Potassium: 3 mEq/L — ABNORMAL LOW (ref 3.5–5.1)
Sodium: 138 mEq/L (ref 135–145)
Total Protein: 7.2 g/dL (ref 6.0–8.3)

## 2013-03-16 LAB — GLUCOSE, CAPILLARY
Glucose-Capillary: 137 mg/dL — ABNORMAL HIGH (ref 70–99)
Glucose-Capillary: 269 mg/dL — ABNORMAL HIGH (ref 70–99)
Glucose-Capillary: 190 mg/dL — ABNORMAL HIGH (ref 70–99)

## 2013-03-16 LAB — CBC
HCT: 26.5 % — ABNORMAL LOW (ref 39.0–52.0)
MCHC: 31.7 g/dL (ref 30.0–36.0)
MCV: 89.2 fL (ref 78.0–100.0)
RDW: 13.1 % (ref 11.5–15.5)

## 2013-03-16 LAB — HEPATITIS PANEL, ACUTE
Hep A IgM: NEGATIVE
Hep B C IgM: NEGATIVE

## 2013-03-16 LAB — CLOSTRIDIUM DIFFICILE BY PCR: Toxigenic C. Difficile by PCR: NEGATIVE

## 2013-03-16 MED ORDER — PREDNISONE 20 MG PO TABS
40.0000 mg | ORAL_TABLET | Freq: Every day | ORAL | Status: DC
  Administered 2013-03-16 – 2013-03-17 (×2): 40 mg via ORAL
  Filled 2013-03-16 (×3): qty 2

## 2013-03-16 MED ORDER — POTASSIUM CHLORIDE CRYS ER 20 MEQ PO TBCR
40.0000 meq | EXTENDED_RELEASE_TABLET | Freq: Once | ORAL | Status: AC
  Administered 2013-03-16: 40 meq via ORAL
  Filled 2013-03-16: qty 2

## 2013-03-16 NOTE — Evaluation (Signed)
Occupational Therapy Evaluation Patient Details Name: [name] MRN: [id number] DOB: Dec 20, 1940 Today's Date: 03/16/2013 Time: 7425-9563 OT Time Calculation (min): 22 min  OT Assessment / Plan / Recommendation Clinical Impression  72 y.o. male admitted with lung mass. Pt has h/o falls at home and was independent with walking short distances prior to admission. Pt reported new 10/10 R foot pain at MTP joint, no h/o trauma nor gout. MD and RN notified. Pt ambulated 3' with RW, distance limited by R foot pain. HHOT and RW recommended. Pt would benefit from acute OT to maximize safety and independence with mobility.     OT Assessment  Patient needs continued OT Services    Follow Up Recommendations  Home health OT       Equipment Recommendations  None recommended by OT       Frequency  Min 3X/week    Precautions / Restrictions Precautions Precautions: Fall Restrictions Weight Bearing Restrictions: No Other Position/Activity Restrictions: WBAT per Dr Waymon Amato       ADL  Lower Body Dressing: Simulated;Moderate assistance Where Assessed - Lower Body Dressing: Supported sit to Pharmacist, hospital: Simulated;Moderate assistance Toilet Transfer Method: Sit to stand Toileting - Clothing Manipulation and Hygiene: Maximal assistance Where Assessed - Toileting Clothing Manipulation and Hygiene: Standing    OT Diagnosis: Generalized weakness;Acute pain  OT Problem List: Decreased strength;Pain;Decreased safety awareness;Impaired balance (sitting and/or standing);Decreased activity tolerance OT Treatment Interventions: Self-care/ADL training;Patient/family education   OT Goals Acute Rehab OT Goals OT Goal Formulation: With patient Time For Goal Achievement: 03/16/13 Potential to Achieve Goals: Good ADL Goals Pt Will Perform Grooming: with supervision;Standing at sink ADL Goal: Grooming - Progress: Goal set today Pt Will Perform Lower Body Dressing: with supervision;Sit to  stand from chair ADL Goal: Lower Body Dressing - Progress: Goal set today Pt Will Transfer to Toilet: with supervision;Comfort height toilet ADL Goal: Toilet Transfer - Progress: Goal set today Pt Will Perform Toileting - Clothing Manipulation: with supervision ADL Goal: Toileting - Clothing Manipulation - Progress: Goal set today  Visit Information  Last OT Received On: 03/16/13 Assistance Needed: +1    Subjective Data  Subjective: Mt left foot hurts!   Prior Functioning     Home Living Lives With: Spouse Available Help at Discharge: Family;Available PRN/intermittently Type of Home: Other (Comment) (townhome) Home Access: Stairs to enter [employer] of Steps: 1 Home Layout: One level Bathroom Shower/Tub: Engineer, manufacturing systems: Standard Home Adaptive Equipment: Built-in shower seat Additional Comments: wife works part time Prior Function Level of Independence: Independent Vocation: Retired Comments: 2-3 falls PTA Communication Communication: No difficulties Dominant Hand: Right         Vision/Perception Vision - History Patient Visual Report: No change from baseline   Cognition  Cognition Arousal/Alertness: Awake/alert Behavior During Therapy: WFL for tasks assessed/performed Overall Cognitive Status: Within Functional Limits for tasks assessed    Extremity/Trunk Assessment Right Upper Extremity Assessment RUE ROM/Strength/Tone: WFL for tasks assessed Left Upper Extremity Assessment LUE ROM/Strength/Tone: WFL for tasks assessed Right Lower Extremity Assessment RLE ROM/Strength/Tone: Due to pain;Deficits RLE ROM/Strength/Tone Deficits: R ankle NT 2* 10/10 pain at MTP, knee ext 4/5, knee/hip AROM WFL RLE Sensation: WFL - Light Touch RLE Coordination: WFL - gross/fine motor Left Lower Extremity Assessment LLE ROM/Strength/Tone: Within functional levels LLE Sensation: WFL - Light Touch LLE Coordination: WFL - gross/fine motor Trunk  Assessment Trunk Assessment: Kyphotic     Mobility Bed Mobility Bed Mobility: Sit to Supine Sit to Supine: 4: Min assist  Details for Bed Mobility Assistance: min A for RLE into bed Transfers Sit to Stand: 4: Min assist;With upper extremity assist;From chair/3-in-1;With armrests Stand to Sit: 4: Min assist;To bed;With upper extremity assist Details for Transfer Assistance: verbal cues for hand placement, assist to rise/steady 2* R foot pain     Exercise     Balance Balance Balance Assessed: Yes Static Sitting Balance Static Sitting - Balance Support: Bilateral upper extremity supported;Feet supported Static Sitting - Level of Assistance: 6: Modified independent (Device/Increase time) Static Sitting - Comment/# of Minutes: 2   End of Session OT - End of Session Equipment Utilized During Treatment: Gait belt Activity Tolerance: Patient limited by fatigue;Patient limited by pain Patient left: in bed;with family/visitor present;with call bell/phone within reach  GO     [name], [employer] [facility] 03/16/2013, 11:55 AM

## 2013-03-16 NOTE — Progress Notes (Signed)
*  PRELIMINARY RESULTS* Vascular Ultrasound Carotid Duplex (Doppler) has been completed.  Preliminary findings:  Right = <40% ICA stenosis. Mild ECA stenosis. Left = 40-59% ICA stenosis. Moderate ECA stenosis.  Farrel Demark, RDMS, RVT  03/16/2013, 10:59 AM

## 2013-03-16 NOTE — Evaluation (Signed)
Physical Therapy Evaluation Patient Details Name: [name] MRN: [id number] DOB: [birth year] Today's Date: 03/16/2013 Time: 4098-1191 PT Time Calculation (min): 33 min  PT Assessment / Plan / Recommendation Clinical Impression  72 y.o. male admitted with lung mass. Pt has h/o falls at home and was independent with walking short distances prior to admission. Pt reported new 10/10 R foot pain at MTP joint, no h/o trauma nor gout. MD and RN notified. Pt ambulated 3' with RW, distance limited by R foot pain. HHPT and RW recommended. Pt would benefit from acute PT to maximize safety and independence with mobility.     PT Assessment       Follow Up Recommendations  Home health PT    Does the patient have the potential to tolerate intense rehabilitation      Barriers to Discharge        Equipment Recommendations  Rolling walker with 5" wheels    Recommendations for Other Services OT consult   Frequency      Precautions / Restrictions Precautions Precautions: Fall Restrictions Weight Bearing Restrictions: No Other Position/Activity Restrictions: WBAT per Dr Waymon Amato   Pertinent Vitals/Pain *10/10 R foot pain at MTP joint at rest and with activity, RN/MD notified**      Mobility  Bed Mobility Bed Mobility: Sit to Supine Sit to Supine: 4: Min assist Details for Bed Mobility Assistance: min A for RLE into bed Transfers Transfers: Sit to Stand;Stand to Sit Sit to Stand: 4: Min assist;With upper extremity assist;From chair/3-in-1;With armrests Stand to Sit: 4: Min assist;To bed;With upper extremity assist Details for Transfer Assistance: verbal cues for hand placement, assist to rise/steady 2* R foot pain Ambulation/Gait Ambulation/Gait Assistance: 4: Min assist Ambulation Distance (Feet): 3 Feet Assistive device: Rolling walker Ambulation/Gait Assistance Details: cues for sequencing, assist to steady, distance limited by R foot pain Gait Pattern: Decreased stance time -  right;Decreased step length - right;Decreased step length - left;Antalgic Gait velocity: decreased    Exercises     PT Diagnosis:    PT Problem List:   PT Treatment Interventions:     PT Goals Acute Rehab PT Goals PT Goal Formulation: With patient/family Time For Goal Achievement: 03/30/13 Potential to Achieve Goals: Fair Pt will go Supine/Side to Sit: with modified independence;with rail PT Goal: Supine/Side to Sit - Progress: Goal set today Pt will go Sit to Supine/Side: with modified independence;with rail PT Goal: Sit to Supine/Side - Progress: Goal set today Pt will go Sit to Stand: with supervision PT Goal: Sit to Stand - Progress: Goal set today Pt will Ambulate: 16 - 50 feet;with supervision;with rolling walker PT Goal: Ambulate - Progress: Goal set today Pt will Go Up / Down Stairs: 1-2 stairs;with min assist;with rolling walker PT Goal: Up/Down Stairs - Progress: Goal set today  Visit Information  Last PT Received On: 03/16/13 Assistance Needed: +1    Subjective Data  Subjective: My R foot hurts.  Patient Stated Goal: return home   Prior Functioning  Home Living Lives With: Spouse Available Help at Discharge: Family;Available PRN/intermittently Type of Home: Other (Comment) (townhome) Home Access: Stairs to enter [employer] of Steps: 1 Home Layout: One level Bathroom Shower/Tub: Engineer, manufacturing systems: Standard Home Adaptive Equipment: Built-in shower seat Additional Comments: wife works part time Prior Function Level of Independence: Independent Vocation: Retired Comments: 2-3 falls PTA Communication Communication: No difficulties Dominant Hand: Right    Cognition  Cognition Arousal/Alertness: Awake/alert Behavior During Therapy: WFL for tasks assessed/performed Overall Cognitive  Status: Within Functional Limits for tasks assessed    Extremity/Trunk Assessment Right Upper Extremity Assessment RUE ROM/Strength/Tone: [facility] for  tasks assessed Left Upper Extremity Assessment LUE ROM/Strength/Tone: WFL for tasks assessed Right Lower Extremity Assessment RLE ROM/Strength/Tone: Due to pain;Deficits RLE ROM/Strength/Tone Deficits: R ankle NT 2* 10/10 pain at MTP, knee ext 4/5, knee/hip AROM WFL RLE Sensation: WFL - Light Touch RLE Coordination: WFL - gross/fine motor Left Lower Extremity Assessment LLE ROM/Strength/Tone: Within functional levels LLE Sensation: WFL - Light Touch LLE Coordination: WFL - gross/fine motor Trunk Assessment Trunk Assessment: Kyphotic   Balance Balance Balance Assessed: Yes Static Sitting Balance Static Sitting - Balance Support: Bilateral upper extremity supported;Feet supported Static Sitting - Level of Assistance: 6: Modified independent (Device/Increase time) Static Sitting - Comment/# of Minutes: 2  End of Session PT - End of Session Equipment Utilized During Treatment: Gait belt Activity Tolerance: Patient limited by pain Patient left: in bed;with call bell/phone within reach;with family/visitor present Nurse Communication: Mobility status  GP     Ralene Bathe Kistler 03/16/2013, 10:19 AM [phone]

## 2013-03-16 NOTE — Progress Notes (Signed)
Pt was lethargic all day. Pt stated that all he wanted to do was sleep. At 1600, Pt's wife ordered him dinner and when it arrived the patient sat straight up in bed and ate everything on his plate. He was conversating and laughing. He said his pain in his foot was no longer their and he was ready to go home. This was an extreme change from earlier but a great one. Since then pt is sitting up watching the news and eating a popsicle. Pt's wife happy with progression.

## 2013-03-16 NOTE — Progress Notes (Signed)
[facility] PROGRESS NOTE  [name] XBM:[id number] DOB: 30-Apr-1941 DOA: 03/14/2013 PCP: Sonda Primes, MD Gastroenterology: Dr. Leone Payor & Dr. Margaretha Glassing @ [facility].  Nephrologist: Dr. Elvis Coil Cardiology: [facility] cardiology.  Vascular surgery:   Brief narrative 72 year old male with history of CAD, Barrett's esophagus, type II DM, HTN, AAA, smoker admitted on 03/14/13 with 2 months history of frequent falls, 30 pound weight loss, dry cough, poor appetite, on and off diarrhea diagnosed as lymphocytic colitis by colonoscopy and biopsy, generalized weakness and episodes of passing out. Recent MRI brain at [facility] on 5/12-no acute or concerning findings. Patient went to see his PCP aware chest x-ray suggested pneumonia in right lung and was sent to ED where CT chest revealed right upper lobe mass. Hospitalist admission requested.   Assessment/Plan: 1. Right lung mass/possibly lung cancer: Status post biopsy by IR on 5/14-await pathology results and then consider oncology consultation. Noncontrasted CT abdomen did not show metastatic disease. 2. Metabolic acidosis: Possibly anion and non-anion gap-related to renal failure, intermittent diarrhea. Resolved after bicarbonate drip. DC bicarbonate drip. 3. Acute on stage III chronic kidney disease: Baseline creatinine probably 2.7. Improved. Follow BMP in a.m. DC IV fluids. 4. Hypokalemia: Likely secondary to bicarbonate drip. Replete by mouth and follow BMP in a.m. 5. Abnormal LFTs: Unclear etiology. Noncontrasted CT abdomen without metastatic disease. Improving. No etiology seen even on abdominal ultrasound. Hepatitis B C IgM negative. 6. Anemia: Chronic disease versus iron deficiency. Anemia panel appreciated. Hemoglobin dropped from 9.6 > 8.4. Follow CBC in a.m. transfuse PRBC if hemoglobin less than 7 g/dL. 7. Leukocytosis:? Secondary to steroids for his colitis. C. difficile PCR negative. Hold antibiotics. Improving. 8. Possible  syncope: Possible orthostatic BP changes this morning. Telemetry: Sinus rhythm. Troponins negative. Recent cardiac stress test negative. Check carotid Dopplers. 9. Frequent falls: [facility] brain at [facility] without concerning intracranial findings. PT and OT evaluation. 10. Type II DM: SSI. A1c 6.3 on 5/12. 11. History of CAD status post PCI: Asymptomatic. Continue aspirin. EKG 5/14 without acute changes. 12. History of AAA status post stent: OP follow up. Increased in size on CT compared to 2012. 13. Tobacco abuse: Cessation counseled. Nicotine patch. 14. History of Barrett's esophagus/recently diagnosed lymphocytic colitis : Recent colonoscopy. Off budesonide since Friday per OP M.D. Recommendation. 15. Right first toe pain: Osteoarthritis versus gout. No prior history of gout.X-ray did not show any acute fracture. Chronic right second metatarsal fracture. Check uric acid. Unable to use NSAIDs or colchicine secondary to renal insufficiency and colitis. Short course of oral steroids. Monitor.  16. Lymphocytic colitis: No diarrhea.  Code Status:  Full Family Communication:  discussed with spouse at bedside Disposition Plan:  remains inpatient.   Consultants:  Interventional radiology  Procedures:   CT guided biopsy of RUL pulmonary mass the 03/15/13  Antibiotics:   None   HPI/Subjective: Right first toe pain which began sometime this morning or last night. Denies trauma. Denies history of gout.   Objective: Filed Vitals:   03/15/13 2224 03/16/13 0632 03/16/13 0722 03/16/13 0723  BP:  153/70 152/73 127/66  Pulse: 92 85 88 92  Temp:  99.5 F (37.5 C)    TempSrc:  Oral    Resp:  18    Height:      Weight:  68.085 kg (150 lb 1.6 oz)    SpO2:  93%      Intake/Output Summary (Last 24 hours) at 03/16/13 1250 Last data filed at 03/16/13 0858  Gross per 24  hour  Intake 1636.25 ml  Output    700 ml  Net 936.25 ml   Filed Weights   03/14/13 1417 03/16/13 1610  Weight: 65.772  kg (145 lb) 68.085 kg (150 lb 1.6 oz)    Exam:   General exam:  elderly frail male patient, No obvious distress.   Respiratory system: Clear. No increased work of breathing.  Cardiovascular system: S1 & S2 heard, RRR. No JVD, murmurs, gallops, clicks or pedal edema. Telemetry: Sinus rhythm   Gastrointestinal system: Abdomen is nondistended, soft and nontender. Normal bowel sounds heard.  Central nervous system: Alert and oriented. No focal neurological deficits.  Extremities: Symmetric 5 x 5 power. Right first metatarsophalangeal joint mildly swollen and tender but no redness or increased warmth.  Data Reviewed: Basic Metabolic Panel:  Recent Labs Lab 03/13/13 1356 03/14/13 1131 03/15/13 0220 03/16/13 0435  NA 134* 135 133* 138  K 4.6 3.9 3.9 3.0*  CL 99 107 97 96  CO2 17*  --  15* 23  GLUCOSE 130* 117* 115* 113*  BUN 64* 71* 66* 62*  CREATININE 3.6* 3.30* 3.07* 2.69*  CALCIUM 8.3*  --  8.3* 7.5*   Liver Function Tests:  Recent Labs Lab 03/13/13 1356 03/15/13 0220 03/16/13 0435  AST 82* 68* 64*  ALT 138* 115* 97*  ALKPHOS 209* 259* 231*  BILITOT 0.7 0.3 0.2*  PROT 8.8* 7.9 7.2  ALBUMIN 3.1* 3.0* 2.5*    Recent Labs Lab 03/13/13 1356  LIPASE 21.0   No results found for this basename: AMMONIA,  in the last 168 hours CBC:  Recent Labs Lab 03/13/13 1356 03/14/13 1052 03/14/13 1131 03/15/13 0220 03/16/13 0435  WBC 24.1 Repeated and verified X2.* 21.6*  --  20.6* 17.6*  NEUTROABS 21.2* 17.5*  --   --   --   HGB 10.6* 9.4* 10.5* 9.6* 8.4*  HCT 31.4* 28.2* 31.0* 29.3* 26.5*  MCV 88.7 90.1  --  90.4 89.2  PLT 371.0 403*  --  357 319   Cardiac Enzymes:  Recent Labs Lab 03/14/13 1455 03/14/13 2027 03/15/13 0220  TROPONINI <0.30 <0.30 <0.30   BNP (last 3 results) No results found for this basename: PROBNP,  in the last 8760 hours CBG:  Recent Labs Lab 03/15/13 1157 03/15/13 1744 03/15/13 2125 03/16/13 0740 03/16/13 1149  GLUCAP 121*  105* 89 137* 152*    Recent Results (from the past 240 hour(s))  CULTURE, BLOOD (ROUTINE X 2)     Status: None   Collection Time    03/14/13  2:55 PM      Result Value Range Status   Specimen Description BLOOD LEFT ARM   Final   Special Requests BOTTLES DRAWN AEROBIC AND ANAEROBIC LEFT HAND   Final   Culture  Setup Time 03/14/2013 22:48   Final   Culture     Final   Value:        BLOOD CULTURE RECEIVED NO GROWTH TO DATE CULTURE WILL BE HELD FOR 5 DAYS BEFORE ISSUING A FINAL NEGATIVE REPORT   Report Status PENDING   Incomplete  CULTURE, BLOOD (ROUTINE X 2)     Status: None   Collection Time    03/14/13  3:08 PM      Result Value Range Status   Specimen Description BLOOD LEFT ARM   Final   Special Requests BOTTLES DRAWN AEROBIC AND ANAEROBIC 10CC   Final   Culture  Setup Time 03/14/2013 22:48   Final   Culture  Final   Value:        BLOOD CULTURE RECEIVED NO GROWTH TO DATE CULTURE WILL BE HELD FOR 5 DAYS BEFORE ISSUING A FINAL NEGATIVE REPORT   Report Status PENDING   Incomplete  CLOSTRIDIUM DIFFICILE BY PCR     Status: None   Collection Time    03/15/13  4:49 PM      Result Value Range Status   C difficile by pcr NEGATIVE  NEGATIVE Final     Studies: Ct Abdomen Pelvis Wo Contrast  03/14/2013   *RADIOLOGY REPORT*  Clinical Data: Lung mass, abnormal LFTs  CT ABDOMEN AND PELVIS WITHOUT CONTRAST  Technique:  Multidetector CT imaging of the abdomen and pelvis was performed following the standard protocol without intravenous contrast.  Comparison: 12/03/2010  Findings: Lung bases are clear.  Small hiatal hernia.  Surgical clips near the GE junction.  Unenhanced liver, spleen, pancreas, and right adrenal gland are within normal limits.  Mild nodular thickening of the left adrenal gland.  Possible tiny layering gallstone (series 2/image 22).  No associated inflammatory changes.  Left renal atrophy. Dated bilateral renal cysts measuring up to 1.8 cm in the left lower pole.  No renal calculi  or hydronephrosis.  No evidence of bowel obstruction.  Normal appendix.  4.5 x 4.9 cm infrarenal abdominal aortic aneurysm, previously measuring 3.8 x 3.8 cm, with indwelling aortobi-iliac stent. Specifically, there is increased soft tissue along the left lateral aspect of the aneurysm sac (series 2/image 30).  Please note that an endoleak cannot be excluded on unenhanced CT.  No abdominopelvic ascites.  No suspicious abdominopelvic lymphadenopathy.  Prostate is unremarkable.  Bladder is within normal limits.  Mild degenerative changes of the visualized thoracolumbar spine.  IMPRESSION: No evidence of metastatic disease in the abdomen/pelvis on unenhanced CT. Surgically, the unenhanced liver is grossly unremarkable.  Suspected tiny layering gallstone.  No associated inflammatory changes.  Left renal atrophy.  Suspected bilateral renal cysts.  4.5 x 4.9 cm infrarenal abdominal aortic aneurysm, as described above, increased since 2012 with indwelling aortobi-iliac stent.   Original Report Authenticated By: Charline Bills, M.D.   Dg Chest 1 View  03/15/2013   *RADIOLOGY REPORT*  Clinical Data: Right upper lobe mass, post biopsy.  CHEST - 1 VIEW  Comparison: 03/13/2013  Findings: Right upper lobe mass is again noted.  No pneumothorax following biopsy.  Mild hyperinflation of the lungs and interstitial prominence.  Heart is normal size.  No effusions or acute bony abnormality.  IMPRESSION: Right upper lobe mass, COPD.  No pneumothorax following biopsy.   Original Report Authenticated By: Charlett Nose, M.D.   US Abdomen Complete  03/16/2013   *RADIOLOGY REPORT*  Clinical Data:  Abnormal LFTs, diabetes, hypertension, COPD, smoker, coronary artery disease post MI  ULTRASOUND ABDOMEN:  Technique:  Sonography of upper abdominal structures was performed.  Comparison:  CT abdomen and pelvis 03/14/2013  Gallbladder:  Questionable tiny 3 mm calcification versus air of the fact at gallbladder neck.  No gallbladder wall  thickening, pericholecystic fluid or sonographic Murphy's sign.  Common bile duct:  5 mm diameter, normal.  Distal CBD obscured by bowel gas  Liver:  Normal appearance  IVC:  Inadequately visualized due to poor sound transmission and body habitus  Pancreas:  Obscured by bowel gas  Spleen:  Normal appearance, 4.7 cm length  Right kidney:  11.1 cm length.  Normal morphology without mass or hydronephrosis.  Left kidney:  8.4 cm length.  Cortical thinning.  Hypoechoic focus at  inferior pole 2.0 x 1.8 1.8 cm likely small peripelvic cyst, mildly complicated.  No definite shadowing calculi or hydronephrosis.  Aorta:  Normal caliber  Other:  No free fluid  IMPRESSION: Questionable gallstone versus artifact at gallbladder neck without evidence of acute cholecystitis or biliary obstruction. Mildly complicated peripelvic cyst inferior pole left kidney 2.0 cm greatest size.   Original Report Authenticated By: Ulyses Southward, M.D.   Ct Biopsy  03/15/2013   *RADIOLOGY REPORT*  Indication: Indeterminate right upper lobe partially cavitary pulmonary mass worrisome for bronchogenic carcinoma.  CT GUIDED RIGHT UPPER LOBE NODULE CORE NEEDLE BIOPSY  Comparisons: Chest CT - 03/14/2013; CT abdomen pelvis - 03/14/2013  Intravenous medications: Fentanyl 50 mcg IV; Versed 1 mg IV  Contrast: None  Sedation time: 18 minutes  Complications: None immediate  TECHNIQUE/FINDINGS:  Informed consent was obtained from the patient following an explanation of the procedure, risks, benefits and alternatives. The patient understands, agrees and consents for the procedure. All questions were addressed.  A time out was performed prior to the initiation of the procedure.  The patient was positioned in varying degrees of supine slightly LPO on the CT table and a limited chest CT was performed for procedural planning demonstrating the grossly unchanged approximate 4.1 x 4.9 cm partially cavitary mass within the right upper lobe with associated adjacent  ground-glass atelectasis (image 10, series 2).  The operative site was prepped and draped in the usual sterile fashion.  Under sterile conditions and local anesthesia, a 17 gauge coaxial needle was advanced into the peripheral aspect of the mass. Positioning was confirmed with intermittent CT fluoroscopy followed by the acquisition of 3 with an 18 gauge core needle biopsy device.  Limited post procedural chest CT was negative for pneumothorax or additional complication.  The co-axial needle was removed and hemostasis was achieved with manual compression.  A dressing was placed.  The patient tolerated the procedure well without immediate postprocedural complication.  The patient was escorted to have an upright chest radiograph.  IMPRESSION:  Technically successful CT guided core needle biopsy of indeterminate right upper lobe pulmonary mass.   Original Report Authenticated By: Tacey Ruiz, MD   Dg Foot Complete Right  03/16/2013   *RADIOLOGY REPORT*  Clinical Data: Right great toe pain.  Rule out fracture  RIGHT FOOT COMPLETE - 3+ VIEW  Comparison: None  Findings: Chronic healed fracture of the second metatarsal in the mid shaft.  No acute fracture.  Negative for arthropathy.  IMPRESSION: Chronic healed fracture second metatarsal.  No acute fracture.   Original Report Authenticated By: Janeece Riggers, M.D.     Additional labs:   Scheduled Meds: . aspirin EC  81 mg Oral QPM  . atorvastatin  40 mg Oral q1800  . busPIRone  7.5 mg Oral BID  . cloNIDine  0.1 mg Oral BID  . feeding supplement  1 Container Oral BID  . gabapentin  300 mg Oral TID  . insulin aspart  0-9 Units Subcutaneous TID WC  . labetalol  300 mg Oral BID  . lactose free nutrition  237 mL Oral Q24H  . multivitamin with minerals  1 tablet Oral Daily  . nicotine  14 mg Transdermal Daily  . nortriptyline  20 mg Oral QHS  . pantoprazole  40 mg Oral Daily  . PARoxetine  20 mg Oral BID  . predniSONE  40 mg Oral Q breakfast  . sodium  chloride  3 mL Intravenous Q12H   Continuous Infusions:  Principal Problem:   Lung mass Active Problems:   TOBACCO USE DISORDER/SMOKER-SMOKING CESSATION DISCUSSED   AAA   COPD   Barrett's esophagus with high grade dysplasia   Frequent falls   CKD (chronic kidney disease) stage 3, GFR 30-59 ml/min   Dehydration   Leukocytosis   Syncope   Transaminitis   Metabolic acidosis   Renal failure, acute on chronic    Time spent:  35 minutes    [facility]  [facility] Pager [phone].   If 8PM-8AM, please contact night-coverage at [email or web address], password [facility] 03/16/2013, 12:50 PM  LOS: 2 days

## 2013-03-17 ENCOUNTER — Inpatient Hospital Stay (HOSPITAL_COMMUNITY): Payer: Medicare Other

## 2013-03-17 ENCOUNTER — Telehealth: Payer: Self-pay | Admitting: Internal Medicine

## 2013-03-17 DIAGNOSIS — J189 Pneumonia, unspecified organism: Secondary | ICD-10-CM

## 2013-03-17 DIAGNOSIS — J449 Chronic obstructive pulmonary disease, unspecified: Secondary | ICD-10-CM

## 2013-03-17 LAB — COMPREHENSIVE METABOLIC PANEL
AST: 58 U/L — ABNORMAL HIGH (ref 0–37)
Alkaline Phosphatase: 223 U/L — ABNORMAL HIGH (ref 39–117)
BUN: 56 mg/dL — ABNORMAL HIGH (ref 6–23)
CO2: 26 mEq/L (ref 19–32)
Chloride: 96 mEq/L (ref 96–112)
Creatinine, Ser: 2.41 mg/dL — ABNORMAL HIGH (ref 0.50–1.35)
GFR calc non Af Amer: 25 mL/min — ABNORMAL LOW (ref >90)
Potassium: 3.4 mEq/L — ABNORMAL LOW (ref 3.5–5.1)
Total Bilirubin: 0.2 mg/dL — ABNORMAL LOW (ref 0.3–1.2)

## 2013-03-17 LAB — CBC
HCT: 26.5 % — ABNORMAL LOW (ref 39.0–52.0)
Hemoglobin: 8.4 g/dL — ABNORMAL LOW (ref 13.0–17.0)
MCV: 89.5 fL (ref 78.0–100.0)
RBC: 2.96 MIL/uL — ABNORMAL LOW (ref 4.22–5.81)
WBC: 17.5 10*3/uL — ABNORMAL HIGH (ref 4.0–10.5)

## 2013-03-17 LAB — GLUCOSE, CAPILLARY

## 2013-03-17 MED ORDER — CALCIUM CARBONATE ANTACID 500 MG PO CHEW
1.0000 | CHEWABLE_TABLET | Freq: Four times a day (QID) | ORAL | Status: DC | PRN
  Administered 2013-03-17: 200 mg via ORAL
  Filled 2013-03-17 (×2): qty 1

## 2013-03-17 MED ORDER — LEVOFLOXACIN 750 MG PO TABS
750.0000 mg | ORAL_TABLET | Freq: Once | ORAL | Status: AC
  Administered 2013-03-17: 750 mg via ORAL
  Filled 2013-03-17: qty 1

## 2013-03-17 MED ORDER — LEVOFLOXACIN IN D5W 500 MG/100ML IV SOLN: 500.0000 mg | INTRAVENOUS | Status: DC

## 2013-03-17 MED ORDER — LEVOFLOXACIN IN D5W 750 MG/150ML IV SOLN
750.0000 mg | Freq: Once | INTRAVENOUS | Status: DC
  Filled 2013-03-17: qty 150

## 2013-03-17 MED ORDER — PREDNISONE 20 MG PO TABS
20.0000 mg | ORAL_TABLET | Freq: Every day | ORAL | Status: DC
  Administered 2013-03-18: 20 mg via ORAL
  Filled 2013-03-17 (×3): qty 1

## 2013-03-17 MED ORDER — POTASSIUM CHLORIDE CRYS ER 20 MEQ PO TBCR
40.0000 meq | EXTENDED_RELEASE_TABLET | Freq: Once | ORAL | Status: AC
  Administered 2013-03-17: 40 meq via ORAL
  Filled 2013-03-17: qty 2

## 2013-03-17 MED ORDER — LEVOFLOXACIN 500 MG PO TABS: 500.0000 mg | ORAL_TABLET | ORAL | Status: DC

## 2013-03-17 NOTE — Progress Notes (Addendum)
[facility] PROGRESS NOTE  [name] RUE:[id number] DOB: [birth year] DOA: 03/14/2013 PCP: Sonda Primes, MD Gastroenterology: Dr. Leone Payor & Dr. Margaretha Glassing @ [facility].  Nephrologist: Dr. Elvis Coil Cardiology: [facility].  Vascular surgery:   Brief narrative 72 year old male with history of CAD, Barrett's esophagus, type II DM, HTN, AAA, smoker admitted on 03/14/13 with 2 months history of frequent falls, 30 pound weight loss, dry cough, poor appetite, on and off diarrhea diagnosed as lymphocytic colitis by colonoscopy and biopsy, generalized weakness and episodes of passing out. Recent MRI brain at [facility] on 5/12-no acute or concerning findings. Patient went to see his PCP aware chest x-ray suggested pneumonia in right lung and was sent to ED where CT chest revealed right upper lobe mass. Hospitalist admission requested.   Assessment/Plan: 1. Right lung mass/possibly lung cancer: Status post biopsy by IR on 5/14-pathology negative for malignancy. No cultures were unfortunately sent during biopsy. Noncontrasted CT abdomen did not show metastatic disease. Pulmonary consulted and their input appreciated. Discussed with patient and spouse regarding pulmonology recommendations of aggressive workup-open lung biopsy versus empiric antibiotic therapy and repeat CT in 6-8 weeks. Patient and family opted for empiric antibiotic therapy and follow her. Will start Levaquin. Checking quantiferon gold test. Outpatient followup with pulmonology.  2. Metabolic acidosis: Possibly anion and non-anion gap-related to renal failure, intermittent diarrhea. Resolved after bicarbonate drip.  3. Acute on stage III chronic kidney disease: Baseline creatinine probably 2.7. Improved. Follow BMP in a.m. DC IV fluids. 4. Hypokalemia: Likely secondary to bicarbonate drip. Replete by mouth and follow BMP in a.m. better. 5. Abnormal LFTs: Unclear etiology. Noncontrasted CT abdomen without metastatic disease.  Improving. No etiology seen even on abdominal ultrasound. Hepatitis B C IgM negative. improving.  6. Anemia: Chronic disease versus iron deficiency. Anemia panel appreciated. Hemoglobin dropped from 9.6 > 8.4. Follow CBC in a.m. transfuse PRBC if hemoglobin less than 7 g/dL.stable.  7. Leukocytosis:? Secondary to steroids for his colitis. C. difficile PCR negative. Hold antibiotics. Improving. 8. Possible syncope: Possible orthostatic BP changes this morning. Telemetry: Sinus rhythm. Troponins negative. Recent cardiac stress test negative. Check carotid Dopplers-right <40% ICA stenosis and left 40-59% ICA stenosis . 9. Frequent falls: MRI brain at [facility] without concerning intracranial findings. PT and OT evaluation. 10. Type II DM: SSI. A1c 6.3 on 5/12.now uncontrolled-secondary to steroids. We'll rapidly taper steroids.  11. History of CAD status post PCI: Asymptomatic. Continue aspirin. EKG 5/14 without acute changes. 12. History of AAA status post stent: OP follow up. Increased in size on CT compared to 2012. 13. Tobacco abuse: Cessation counseled. Nicotine patch. 14. History of Barrett's esophagus/recently diagnosed lymphocytic colitis : Recent colonoscopy. Off budesonide since Friday per OP M.D. Recommendation. 15. Right first toe pain: Osteoarthritis versus gout. No prior history of gout.X-ray did not show any acute fracture. Chronic right second metatarsal fracture. Check uric acid-13.7 . Unable to use NSAIDs or colchicine secondary to renal insufficiency and colitis. Short course of oral steroids. Monitor.  16. Lymphocytic colitis: No diarrhea.  Code Status:  Full Family Communication:  discussed with spouse at bedside Disposition Plan:  remains inpatient.   Consultants:  Interventional radiology  Procedures:   CT guided biopsy of RUL pulmonary mass the 03/15/13  Antibiotics:   None   HPI/Subjective: Right first toe pain resolved. Overall much better with improved  appetite and strength.   Objective: Filed Vitals:   03/17/13 0519 03/17/13 0520 03/17/13 0522 03/17/13 1456  BP: 144/73 150/73 80/63 165/74  Pulse: 78 72 90 73  Temp: 97.8 F (36.6 C) 97.8 F (36.6 C) 98 F (36.7 C) 97.6 F (36.4 C)  TempSrc: Oral Oral Oral Oral  Resp: 16 24 24 22   Height:      Weight:      SpO2: 96% 97% 96% 96%    Intake/Output Summary (Last 24 hours) at 03/17/13 1803 Last data filed at 03/17/13 1300  Gross per 24 hour  Intake    720 ml  Output    600 ml  Net    120 ml   Filed Weights   03/14/13 1417 03/16/13 8119  Weight: 65.772 kg (145 lb) 68.085 kg (150 lb 1.6 oz)    Exam:   General exam:  elderly frail male patient, No obvious distress.  sitting on chair.   Respiratory system: Clear. No increased work of breathing.  Cardiovascular system: S1 & S2 heard, RRR. No JVD, murmurs, gallops, clicks or pedal edema.   Gastrointestinal system: Abdomen is nondistended, soft and nontender. Normal bowel sounds heard.  Central nervous system: Alert and oriented. No focal neurological deficits.  Extremities: Symmetric 5 x 5 power. Right first metatarsophalangeal joint mildly swollen and tender but no redness or increased warmth-much improved/findings almost resolved .  Data Reviewed: Basic Metabolic Panel:  Recent Labs Lab 03/13/13 1356 03/14/13 1131 03/15/13 0220 03/16/13 0435 03/17/13 0430  NA 134* 135 133* 138 139  K 4.6 3.9 3.9 3.0* 3.4*  CL 99 107 97 96 96  CO2 17*  --  15* 23 26  GLUCOSE 130* 117* 115* 113* 156*  BUN 64* 71* 66* 62* 56*  CREATININE 3.6* 3.30* 3.07* 2.69* 2.41*  CALCIUM 8.3*  --  8.3* 7.5* 7.6*   Liver Function Tests:  Recent Labs Lab 03/13/13 1356 03/15/13 0220 03/16/13 0435 03/17/13 0430  AST 82* 68* 64* 58*  ALT 138* 115* 97* 89*  ALKPHOS 209* 259* 231* 223*  BILITOT 0.7 0.3 0.2* 0.2*  PROT 8.8* 7.9 7.2 7.2  ALBUMIN 3.1* 3.0* 2.5* 2.5*    Recent Labs Lab 03/13/13 1356  LIPASE 21.0   No results found  for this basename: AMMONIA,  in the last 168 hours CBC:  Recent Labs Lab 03/13/13 1356 03/14/13 1052 03/14/13 1131 03/15/13 0220 03/16/13 0435 03/17/13 0430  WBC 24.1 Repeated and verified X2.* 21.6*  --  20.6* 17.6* 17.5*  NEUTROABS 21.2* 17.5*  --   --   --   --   HGB 10.6* 9.4* 10.5* 9.6* 8.4* 8.4*  HCT 31.4* 28.2* 31.0* 29.3* 26.5* 26.5*  MCV 88.7 90.1  --  90.4 89.2 89.5  PLT 371.0 403*  --  357 319 344   Cardiac Enzymes:  Recent Labs Lab 03/14/13 1455 03/14/13 2027 03/15/13 0220  TROPONINI <0.30 <0.30 <0.30   BNP (last 3 results) No results found for this basename: PROBNP,  in the last 8760 hours CBG:  Recent Labs Lab 03/16/13 1751 03/16/13 1958 03/17/13 0730 03/17/13 1138 03/17/13 1640  GLUCAP 269* 190* 200* 189* 244*    Recent Results (from the past 240 hour(s))  CULTURE, BLOOD (ROUTINE X 2)     Status: None   Collection Time    03/14/13  2:55 PM      Result Value Range Status   Specimen Description BLOOD LEFT ARM   Final   Special Requests BOTTLES DRAWN AEROBIC AND ANAEROBIC LEFT HAND   Final   Culture  Setup Time 03/14/2013 22:48   Final   Culture  Final   Value:        BLOOD CULTURE RECEIVED NO GROWTH TO DATE CULTURE WILL BE HELD FOR 5 DAYS BEFORE ISSUING A FINAL NEGATIVE REPORT   Report Status PENDING   Incomplete  CULTURE, BLOOD (ROUTINE X 2)     Status: None   Collection Time    03/14/13  3:08 PM      Result Value Range Status   Specimen Description BLOOD LEFT ARM   Final   Special Requests BOTTLES DRAWN AEROBIC AND ANAEROBIC 10CC   Final   Culture  Setup Time 03/14/2013 22:48   Final   Culture     Final   Value:        BLOOD CULTURE RECEIVED NO GROWTH TO DATE CULTURE WILL BE HELD FOR 5 DAYS BEFORE ISSUING A FINAL NEGATIVE REPORT   Report Status PENDING   Incomplete  CLOSTRIDIUM DIFFICILE BY PCR     Status: None   Collection Time    03/15/13  4:49 PM      Result Value Range Status   C difficile by pcr NEGATIVE  NEGATIVE Final      Studies: Ct Head Wo Contrast  03/17/2013   *RADIOLOGY REPORT*  Clinical Data: Altered mental status, lethargic, lung mass.  CT HEAD WITHOUT CONTRAST  Technique:  Contiguous axial images were obtained from the base of the skull through the vertex without contrast.  Comparison: None.  Findings: There is no evidence for acute infarction, intracranial hemorrhage, mass lesion, hydrocephalus, or extra-axial fluid.  Mild atrophy.  Moderate chronic microvascular ischemic change. Calvarium intact.  Clear sinuses and mastoids.  Vascular calcification.  IMPRESSION: Atrophy and small vessel disease.  No visible intracranial mass lesion or stroke.  No osseous destructive changes are observed.   Original Report Authenticated By: Davonna Belling, M.D.   US Abdomen Complete  03/16/2013   *RADIOLOGY REPORT*  Clinical Data:  Abnormal LFTs, diabetes, hypertension, COPD, smoker, coronary artery disease post MI  ULTRASOUND ABDOMEN:  Technique:  Sonography of upper abdominal structures was performed.  Comparison:  CT abdomen and pelvis 03/14/2013  Gallbladder:  Questionable tiny 3 mm calcification versus air of the fact at gallbladder neck.  No gallbladder wall thickening, pericholecystic fluid or sonographic Murphy's sign.  Common bile duct:  5 mm diameter, normal.  Distal CBD obscured by bowel gas  Liver:  Normal appearance  IVC:  Inadequately visualized due to poor sound transmission and body habitus  Pancreas:  Obscured by bowel gas  Spleen:  Normal appearance, 4.7 cm length  Right kidney:  11.1 cm length.  Normal morphology without mass or hydronephrosis.  Left kidney:  8.4 cm length.  Cortical thinning.  Hypoechoic focus at inferior pole 2.0 x 1.8 1.8 cm likely small peripelvic cyst, mildly complicated.  No definite shadowing calculi or hydronephrosis.  Aorta:  Normal caliber  Other:  No free fluid  IMPRESSION: Questionable gallstone versus artifact at gallbladder neck without evidence of acute cholecystitis or biliary  obstruction. Mildly complicated peripelvic cyst inferior pole left kidney 2.0 cm greatest size.   Original Report Authenticated By: Ulyses Southward, M.D.   Dg Foot Complete Right  03/16/2013   *RADIOLOGY REPORT*  Clinical Data: Right great toe pain.  Rule out fracture  RIGHT FOOT COMPLETE - 3+ VIEW  Comparison: None  Findings: Chronic healed fracture of the second metatarsal in the mid shaft.  No acute fracture.  Negative for arthropathy.  IMPRESSION: Chronic healed fracture second metatarsal.  No acute fracture.   Original  Report Authenticated By: Janeece Riggers, M.D.     Additional labs:   Scheduled Meds: . aspirin EC  81 mg Oral QPM  . atorvastatin  40 mg Oral q1800  . busPIRone  7.5 mg Oral BID  . cloNIDine  0.1 mg Oral BID  . feeding supplement  1 Container Oral BID  . gabapentin  300 mg Oral TID  . insulin aspart  0-9 Units Subcutaneous TID WC  . labetalol  300 mg Oral BID  . lactose free nutrition  237 mL Oral Q24H  . multivitamin with minerals  1 tablet Oral Daily  . nicotine  14 mg Transdermal Daily  . nortriptyline  20 mg Oral QHS  . pantoprazole  40 mg Oral Daily  . PARoxetine  20 mg Oral BID  . predniSONE  40 mg Oral Q breakfast  . sodium chloride  3 mL Intravenous Q12H   Continuous Infusions:    Principal Problem:   Lung mass Active Problems:   TOBACCO USE DISORDER/SMOKER-SMOKING CESSATION DISCUSSED   AAA   COPD   Barrett's esophagus with high grade dysplasia   Frequent falls   CKD (chronic kidney disease) stage 3, GFR 30-59 ml/min   Dehydration   Leukocytosis   Syncope   Transaminitis   Metabolic acidosis   Renal failure, acute on chronic   Hypokalemia    Time spent:  35 minutes    [facility]  [facility] Pager [phone].   If 8PM-8AM, please contact night-coverage at [email or web address], password [facility] 03/17/2013, 6:03 PM  LOS: 3 days

## 2013-03-17 NOTE — Telephone Encounter (Signed)
Gavin Pound, He had to go to ER and had a CT there. Pls disregard the order Thx

## 2013-03-17 NOTE — Progress Notes (Signed)
Occupational Therapy Treatment Patient Details Name: [name] MRN: [id number] DOB: July 28, 1941 Today's Date: 03/17/2013 Time: 4098-1191 OT Time Calculation (min): 10 min  OT Assessment / Plan / Recommendation Comments on Treatment Session much improved this OT visit!                   Plan Discharge plan remains appropriate           ADL  Grooming: Performed;Wash/dry hands;Min guard Lower Body Dressing: Performed;Min guard Where Assessed - Lower Body Dressing: Unsupported sit to stand Toilet Transfer: Performed;Min guard Statistician Method: Sit to Barista: Comfort height toilet Toileting - Clothing Manipulation and Hygiene: Min guard;Performed Where Assessed - Engineer, mining and Hygiene: Standing      OT Goals ADL Goals ADL Goal: Grooming - Progress: Progressing toward goals ADL Goal: Lower Body Dressing - Progress: Progressing toward goals ADL Goal: Toilet Transfer - Progress: Progressing toward goals ADL Goal: Toileting - Clothing Manipulation - Progress: Progressing toward goals  Visit Information  Last OT Received On: 03/17/13    Subjective Data  Subjective: My foot is all better   Prior Functioning       Cognition  Cognition Arousal/Alertness: Awake/alert Behavior During Therapy: WFL for tasks assessed/performed Overall Cognitive Status: Within Functional Limits for tasks assessed    Mobility  Bed Mobility Bed Mobility: Supine to Sit Sit to Supine: 5: Supervision;With rail;HOB flat Transfers Transfers: Sit to Stand;Stand to Sit Sit to Stand: 5: Supervision;With upper extremity assist;From bed;From chair/3-in-1;From toilet Stand to Sit: 5: Supervision;With upper extremity assist;To toilet;To chair/3-in-1          End of Session OT - End of Session Activity Tolerance: Patient tolerated treatment well Patient left: in chair;with call bell/phone within reach;with family/visitor present  GO      Karilyn Wind, [place] 03/17/2013, 10:38 AM

## 2013-03-17 NOTE — Consult Note (Signed)
PULMONARY  / CRITICAL CARE MEDICINE  Name: [name] MRN: [id number] DOB: 08-14-1941    ADMISSION DATE:  03/14/2013 CONSULTATION DATE:  03/17/13  REFERRING MD :  [facility] PRIMARY SERVICE:  TRH  CHIEF COMPLAINT:  Lung mass  BRIEF PATIENT DESCRIPTION: 72 year old male with 57 pack year history of smoking who comes to the hospital for frequent falls.  CXR was done and patient was noted to have a RUL opacification/mass.  CT of the chest was done that revealed a complex mass subpleural on the right.  CT guided biopsy was done that was negative for malignancy.  Patient has no symptoms of PNA but WBC is elevated.  Patient is on no abx.  No cultures were sent from CT guided biopsy.  -TB exposure, -TB testing, -hemoptysis, +wt loss 30 lbs in 3 months.  SIGNIFICANT EVENTS / STUDIES:  5/14 chest CT 5/15 CT guided biopsy with negative results.  LINES / TUBES: PIV  CULTURES: Sputum 5/16>>>  ANTIBIOTICS: Levaquin 5/16>>>  PAST MEDICAL HISTORY :  Past Medical History  Diagnosis Date  . Diabetes mellitus   . Hypertension   . Anxiety   . COPD (chronic obstructive pulmonary disease)   . Hyperlipemia   . GERD (gastroesophageal reflux disease)   . Barrett's esophagus with high grade dysplasia     s/p RFA  . PVD (peripheral vascular disease)   . CAD (coronary artery disease)   . Tobacco user   . Depression   . Hiatal hernia   . Myocardial infarction 12/1996  . AAA (abdominal aortic aneurysm)   . Renal insufficiency 2010   Past Surgical History  Procedure Laterality Date  . Tonsillectomy    . Nissen fundoplication    . Abdominal aortic aneurysm repair  2010    and right common iliac artery aneurysm, DR HAYES  . Upper gastrointestinal endoscopy  07/07/2011    barretts esophagus, hiatal hernia, gastritis, ablations in esophagus  . Colonoscopy  05/07/2005    diverticulosis, internal and external hemorrhoids  . Abdominal aortic aneurysm repair    . Radiofrequency ablation  multiple     Barrett's, high-grade dysplasia  . Colonoscopy N/A 03/01/2013    Procedure: COLONOSCOPY;  Surgeon: Iva Boop, MD;  Location: [facility];  Service: Endoscopy;  Laterality: N/A;   Prior to Admission medications   Medication Sig Start Date End Date Taking? Authorizing Provider  ALPRAZolam Prudy Feeler) 0.5 MG tablet Take 1 tablet (0.5 mg total) by mouth 2 (two) times daily as needed for sleep. 02/02/13  Yes Georgina Quint Plotnikov, MD  aspirin EC 81 MG tablet Take 81 mg by mouth every evening.   Yes Historical Provider, MD  atorvastatin (LIPITOR) 40 MG tablet Take 40 mg by mouth every morning.   Yes Historical Provider, MD  Budesonide 9 MG TB24 Take 9 mg by mouth daily. 03/03/13  Yes Iva Boop, MD  busPIRone (BUSPAR) 15 MG tablet TAKE 1/2 TABLET BY MOUTH TWICE A DAY 03/04/13  Yes Tresa Garter, MD  Cholecalciferol 1000 UNITS tablet Take 1,000 Units by mouth daily.     Yes Historical Provider, MD  cloNIDine (CATAPRES) 0.1 MG tablet Take 0.1 mg by mouth 2 (two) times daily.   Yes Historical Provider, MD  diphenoxylate-atropine (LOMOTIL) 2.5-0.025 MG per tablet Take 1 tablet by mouth 4 (four) times daily as needed for diarrhea or loose stools. 02/02/13  Yes Georgina Quint Plotnikov, MD  gabapentin (NEURONTIN) 300 MG capsule Take 300 mg by mouth 3 (three) times  daily.   Yes Historical Provider, MD  glimepiride (AMARYL) 1 MG tablet Take 1 mg by mouth daily before breakfast.   Yes Historical Provider, MD  labetalol (NORMODYNE) 300 MG tablet Take 300 mg by mouth 2 (two) times daily. 09/12/12  Yes Georgina Quint Plotnikov, MD  nortriptyline (PAMELOR) 10 MG capsule Take 20 mg by mouth at bedtime.   Yes Historical Provider, MD  omeprazole (PRILOSEC) 40 MG capsule Take 80 mg by mouth daily.   Yes Historical Provider, MD  PARoxetine (PAXIL) 20 MG tablet Take 20 mg by mouth 2 (two) times daily.    Yes Historical Provider, MD  promethazine (PHENERGAN) 12.5 MG tablet Take 1-2 tablets (12.5-25 mg total) by mouth every 8  (eight) hours as needed for nausea. 02/10/13  Yes Georgina Quint Plotnikov, MD  sodium bicarbonate 650 MG tablet Take 1,300 mg by mouth daily.     Yes Historical Provider, MD  temazepam (RESTORIL) 15 MG capsule Take 1-2 capsules (15-30 mg total) by mouth at bedtime as needed for sleep. 02/02/13  Yes Tresa Garter, MD   Allergies  Allergen Reactions  . Tramadol Nausea And Vomiting  . Amlodipine Besylate     REACTION: swelling  . Benazepril Hcl     REACTION: elev K  . Penicillins     REACTION: rash, itching  . Risperidone     REACTION: loss of motor skills  . Valsartan     REACTION: elev K    FAMILY HISTORY:  Family History  Problem Relation Age of Onset  . Coronary artery disease Other   . Diabetes Other   . Asthma Other   . Mental illness Mother     dementia  . Cancer Mother   . Diabetes Mother   . Heart disease Father   . Diabetes Father   . Stomach cancer Father   . Cancer Father   . Colon cancer Neg Hx   . Esophageal cancer Neg Hx    SOCIAL HISTORY:  reports that he has been smoking Cigarettes.  He has a 54 pack-year smoking history. He has never used smokeless tobacco. He reports that he does not drink alcohol or use illicit drugs.  REVIEW OF SYSTEMS:   Constitutional: Negative for fever, chills, weight loss, malaise/fatigue and diaphoresis.  HENT: Negative for hearing loss, ear pain, nosebleeds, congestion, sore throat, neck pain, tinnitus and ear discharge.   Eyes: Negative for blurred vision, double vision, photophobia, pain, discharge and redness.  Respiratory: Negative for cough, hemoptysis, sputum production, shortness of breath, wheezing and stridor.   Cardiovascular: Negative for chest pain, palpitations, orthopnea, claudication, leg swelling and PND.  Gastrointestinal: Negative for heartburn, nausea, vomiting, abdominal pain, diarrhea, constipation, blood in stool and melena.  Genitourinary: Negative for dysuria, urgency, frequency, hematuria and flank pain.   Musculoskeletal: Negative for myalgias, back pain, joint pain and falls.  Skin: Negative for itching and rash.  Neurological: Negative for dizziness, tingling, tremors, sensory change, speech change, focal weakness, seizures, loss of consciousness, weakness and headaches.  Endo/Heme/Allergies: Negative for environmental allergies and polydipsia. Does not bruise/bleed easily.  SUBJECTIVE:   VITAL SIGNS: Temp:  [97.8 F (36.6 C)-98.7 F (37.1 C)] 98 F (36.7 C) (05/16 0522) Pulse Rate:  [72-96] 90 (05/16 0522) Resp:  [16-24] 24 (05/16 0522) BP: (80-151)/(52-73) 80/63 mmHg (05/16 0522) SpO2:  [91 %-99 %] 96 % (05/16 0522)  PHYSICAL EXAMINATION: General:  Elderly chronically ill appearing male, NAD. Neuro:  Alert and interactive, moves all ext to command. HEENT:  [place]/AT, PERRL, EOM-I and -LAN. Neck:  Supple, -LAN and -thyromegally. Cardiovascular:  RRR, Nl S1/S2, -M/R/G. Lungs:  CTA bilaterally. Abdomen:  Soft, NT, ND and +BS. Musculoskeletal:  -edema and -tenderness. Skin:  Intact.  Recent Labs Lab 03/15/13 0220 03/16/13 0435 03/17/13 0430  NA 133* 138 139  K 3.9 3.0* 3.4*  CL 97 96 96  CO2 15* 23 26  BUN 66* 62* 56*  CREATININE 3.07* 2.69* 2.41*  GLUCOSE 115* 113* 156*   Recent Labs Lab 03/15/13 0220 03/16/13 0435 03/17/13 0430  HGB 9.6* 8.4* 8.4*  HCT 29.3* 26.5* 26.5*  WBC 20.6* 17.6* 17.5*  PLT 357 319 344   Dg Chest 1 View  03/15/2013   *RADIOLOGY REPORT*  Clinical Data: Right upper lobe mass, post biopsy.  CHEST - 1 VIEW  Comparison: 03/13/2013  Findings: Right upper lobe mass is again noted.  No pneumothorax following biopsy.  Mild hyperinflation of the lungs and interstitial prominence.  Heart is normal size.  No effusions or acute bony abnormality.  IMPRESSION: Right upper lobe mass, COPD.  No pneumothorax following biopsy.   Original Report Authenticated By: Charlett Nose, M.D.   US Abdomen Complete  03/16/2013   *RADIOLOGY REPORT*  Clinical Data:   Abnormal LFTs, diabetes, hypertension, COPD, smoker, coronary artery disease post MI  ULTRASOUND ABDOMEN:  Technique:  Sonography of upper abdominal structures was performed.  Comparison:  CT abdomen and pelvis 03/14/2013  Gallbladder:  Questionable tiny 3 mm calcification versus air of the fact at gallbladder neck.  No gallbladder wall thickening, pericholecystic fluid or sonographic Murphy's sign.  Common bile duct:  5 mm diameter, normal.  Distal CBD obscured by bowel gas  Liver:  Normal appearance  IVC:  Inadequately visualized due to poor sound transmission and body habitus  Pancreas:  Obscured by bowel gas  Spleen:  Normal appearance, 4.7 cm length  Right kidney:  11.1 cm length.  Normal morphology without mass or hydronephrosis.  Left kidney:  8.4 cm length.  Cortical thinning.  Hypoechoic focus at inferior pole 2.0 x 1.8 1.8 cm likely small peripelvic cyst, mildly complicated.  No definite shadowing calculi or hydronephrosis.  Aorta:  Normal caliber  Other:  No free fluid  IMPRESSION: Questionable gallstone versus artifact at gallbladder neck without evidence of acute cholecystitis or biliary obstruction. Mildly complicated peripelvic cyst inferior pole left kidney 2.0 cm greatest size.   Original Report Authenticated By: Ulyses Southward, M.D.   Ct Biopsy  03/15/2013   *RADIOLOGY REPORT*  Indication: Indeterminate right upper lobe partially cavitary pulmonary mass worrisome for bronchogenic carcinoma.  CT GUIDED RIGHT UPPER LOBE NODULE CORE NEEDLE BIOPSY  Comparisons: Chest CT - 03/14/2013; CT abdomen pelvis - 03/14/2013  Intravenous medications: Fentanyl 50 mcg IV; Versed 1 mg IV  Contrast: None  Sedation time: 18 minutes  Complications: None immediate  TECHNIQUE/FINDINGS:  Informed consent was obtained from the patient following an explanation of the procedure, risks, benefits and alternatives. The patient understands, agrees and consents for the procedure. All questions were addressed.  A time out was  performed prior to the initiation of the procedure.  The patient was positioned in varying degrees of supine slightly LPO on the CT table and a limited chest CT was performed for procedural planning demonstrating the grossly unchanged approximate 4.1 x 4.9 cm partially cavitary mass within the right upper lobe with associated adjacent ground-glass atelectasis (image 10, series 2).  The operative site was prepped and draped in the usual sterile fashion.  Under  sterile conditions and local anesthesia, a 17 gauge coaxial needle was advanced into the peripheral aspect of the mass. Positioning was confirmed with intermittent CT fluoroscopy followed by the acquisition of 3 with an 18 gauge core needle biopsy device.  Limited post procedural chest CT was negative for pneumothorax or additional complication.  The co-axial needle was removed and hemostasis was achieved with manual compression.  A dressing was placed.  The patient tolerated the procedure well without immediate postprocedural complication.  The patient was escorted to have an upright chest radiograph.  IMPRESSION:  Technically successful CT guided core needle biopsy of indeterminate right upper lobe pulmonary mass.   Original Report Authenticated By: Tacey Ruiz, MD   Dg Foot Complete Right  03/16/2013   *RADIOLOGY REPORT*  Clinical Data: Right great toe pain.  Rule out fracture  RIGHT FOOT COMPLETE - 3+ VIEW  Comparison: None  Findings: Chronic healed fracture of the second metatarsal in the mid shaft.  No acute fracture.  Negative for arthropathy.  IMPRESSION: Chronic healed fracture second metatarsal.  No acute fracture.   Original Report Authenticated By: Janeece Riggers, M.D.    ASSESSMENT / PLAN:  72 year old male with 56 pack year of smoking with 30 lbs wt loss in 3 months with frequent falls.  No clininical symptoms of PNA but WBC is elevated.  Mass DDx is either lung cancer or cavitary PNA.  Biopsy is negative and patient has not been on abx.   Depending on how aggressive the patient wants to be options are to start abx then repeat chest CT in 6-8 weeks or to repeat biopsy.  I will defer that to the primary's discussion with the patient.  If they choose to be aggressive then recommend calling CVTS for ?of open lung biopsy given the wt loss history but if to be conservative then recommend starting levaquin, inducing sputum for culture, checking quantiferon gold test and repeat CT in 6-8 weeks.  Will defer to primary on the matter, this was directly communicated with primary MD.  In the meantime, no BD are needed, IS and smoking cessation.  Head CT for frequent falls.  PCCM will see again on Monday.  Alyson Reedy, M.D. Pulmonary and Critical Care Medicine [facility] Pager: [phone]  03/17/2013, 10:00 AM

## 2013-03-17 NOTE — Telephone Encounter (Signed)
DR. [name] PATIENT CT CHEST WO CONTRAST WAS APPROVED BY INSURANCE ,BUT CT ABDOMEN WO CONTRAST   WAS NOT  PLEASE ADVISE PT HAS NOT BEEN SCHEDULE YET

## 2013-03-18 ENCOUNTER — Other Ambulatory Visit: Payer: Self-pay | Admitting: Internal Medicine

## 2013-03-18 DIAGNOSIS — I951 Orthostatic hypotension: Secondary | ICD-10-CM

## 2013-03-18 LAB — COMPREHENSIVE METABOLIC PANEL
Albumin: 2.6 g/dL — ABNORMAL LOW (ref 3.5–5.2)
Alkaline Phosphatase: 213 U/L — ABNORMAL HIGH (ref 39–117)
BUN: 57 mg/dL — ABNORMAL HIGH (ref 6–23)
Chloride: 98 mEq/L (ref 96–112)
Creatinine, Ser: 2.27 mg/dL — ABNORMAL HIGH (ref 0.50–1.35)
GFR calc Af Amer: 32 mL/min — ABNORMAL LOW (ref >90)
GFR calc non Af Amer: 27 mL/min — ABNORMAL LOW (ref >90)
Glucose, Bld: 134 mg/dL — ABNORMAL HIGH (ref 70–99)
Total Bilirubin: 0.2 mg/dL — ABNORMAL LOW (ref 0.3–1.2)

## 2013-03-18 LAB — GLUCOSE, CAPILLARY
Glucose-Capillary: 130 mg/dL — ABNORMAL HIGH (ref 70–99)
Glucose-Capillary: 114 mg/dL — ABNORMAL HIGH (ref 70–99)

## 2013-03-18 MED ORDER — BOOST PLUS PO LIQD: 237.0000 mL | ORAL | Status: DC

## 2013-03-18 MED ORDER — ALPRAZOLAM 0.5 MG PO TABS: 0.5000 mg | ORAL_TABLET | Freq: Two times a day (BID) | ORAL | Status: DC | PRN

## 2013-03-18 MED ORDER — LEVOFLOXACIN 500 MG PO TABS: 500.0000 mg | ORAL_TABLET | ORAL | Status: DC

## 2013-03-18 MED ORDER — ADULT MULTIVITAMIN W/MINERALS CH: 1.0000 | ORAL_TABLET | Freq: Every day | ORAL | Status: DC

## 2013-03-18 MED ORDER — NICOTINE 14 MG/24HR TD PT24: 1.0000 | MEDICATED_PATCH | Freq: Every day | TRANSDERMAL | Status: DC

## 2013-03-18 MED ORDER — POTASSIUM CHLORIDE CRYS ER 20 MEQ PO TBCR
40.0000 meq | EXTENDED_RELEASE_TABLET | Freq: Once | ORAL | Status: AC
  Administered 2013-03-18: 40 meq via ORAL
  Filled 2013-03-18: qty 2

## 2013-03-18 MED ORDER — BOOST / RESOURCE BREEZE PO LIQD: 1.0000 | Freq: Two times a day (BID) | ORAL | Status: DC

## 2013-03-18 MED ORDER — PREDNISONE 10 MG PO TABS: ORAL_TABLET | ORAL | Status: DC

## 2013-03-18 NOTE — Progress Notes (Signed)
Patient's BP appears to be trending up.  Performed a set of orthostatic vitals this am and BP 176/90 lying, 117/70 sitting, and 115/53 standing.  Patient's only complaint was some indigestion at bedtime that was relieved by Tums.  Will continue to monitor patient.  Manson Passey, Ninah Moccio Cherie

## 2013-03-18 NOTE — Discharge Summary (Signed)
Physician Discharge Summary  [name] ZOX:[id number] DOB: [birth year] DOA: 03/14/2013  PCP: Sonda Primes, MD  Admit date: 03/14/2013 Discharge date: 03/18/2013  Time spent: Greater than 30 minutes  Recommendations for Outpatient Follow-up:  1. Dr. Sonda Primes, PCP in 4 days. Recommend repeating labs (CBC & CMP) at that visit. 2. [facility]- call on 03/20/2013 for appointment. 3. Home Health PT, OT & Rolling walker with 5" wheels. 4. Thigh high compression stockings. 5. Follow quantiferon gold test results-sent from the hospital on 03/17/13 6. Vascular surgery followup of AAA which has increased in size since 2012.  Discharge Diagnoses:  Principal Problem:   Lung mass Active Problems:   TOBACCO USE DISORDER/SMOKER-SMOKING CESSATION DISCUSSED   AAA   COPD   Barrett's esophagus with high grade dysplasia   DM (diabetes mellitus), type 2 with peripheral vascular complications   Frequent falls   CKD (chronic kidney disease) stage 3, GFR 30-59 ml/min   Dehydration   Leukocytosis   Syncope   Transaminitis   Metabolic acidosis   Renal failure, acute on chronic   Hypokalemia   Orthostatic hypotension   Discharge Condition: Improved & Stable  Diet recommendation: Heart Healthy & Diabetic diet.  Filed Weights   03/14/13 1417 03/16/13 8119  Weight: 65.772 kg (145 lb) 68.085 kg (150 lb 1.6 oz)    History of present illness:  72 year old male with history of CAD, Barrett's esophagus, type II DM, HTN, AAA, smoker admitted on 03/14/13 with 2 months history of frequent falls, 30 pound weight loss, dry cough, poor appetite, on and off diarrhea diagnosed as lymphocytic colitis by colonoscopy and biopsy, generalized weakness and episodes of passing out. Recent MRI brain at [facility] on 5/12-no acute or concerning findings. Patient went to see his PCP where chest x-ray suggested pneumonia in right lung and was sent to ED where CT chest revealed right upper lobe mass.  Hospitalist admission requested.   Hospital Course:  1. Right lung mass/possibly lung cancer: Patient was admitted to the hospital. Interventional radiology consulted and performed CT-guided biopsy of mass on 5/14-pathology negative for malignancy. No cultures were unfortunately sent during biopsy. Noncontrasted CT abdomen did not show metastatic disease. Pulmonary consulted and recommended open lung biopsy by CVTS if patient wished to be aggressive versus a course of antibiotics and repeat CT chest in 6-8 weeks. Discussed with patient and spouse regarding pulmonology recommendations-patient opted for empiric antibiotic therapy and follow up CT. Checking quantiferon gold test - results pending. Patient was discharged on renally adjusted dose of oral Levaquin/one week duration. Outpatient followup with pulmonology.  2. Orthostatic hypotension: Patient's systolic blood pressure dropped from 176 >115 mmHg on standing. Patient however was not symptomatic of dizziness, lightheadedness or sensation of passing out. He was advised regarding measures to cope with same and compressive lower extremity stockings. Unclear etiology. Clinically euvolemic. 3. Metabolic acidosis: Possibly anion and non-anion gap-related to renal failure, intermittent diarrhea. Resolved after bicarbonate drip.  4. Acute on stage III chronic kidney disease: Baseline creatinine probably 2.7. Improved after IV fluids .  5. Hypokalemia: Likely secondary to bicarbonate drip. Repleted 6. Abnormal LFTs: Unclear etiology. Noncontrasted CT abdomen without metastatic disease. Improving. No etiology seen even on abdominal ultrasound. Hepatitis B C IgM negative. Improving.  7. Anemia: Chronic disease versus iron deficiency. Anemia panel appreciated. Hemoglobin dropped from 9.6 > 8.4. Stable.  8. Leukocytosis:? Secondary to steroids for his colitis. C. difficile PCR negative. Hold antibiotics. Improving. 9. Possible syncope: Telemetry: Sinus rhythm.  Troponins negative.  Recent cardiac stress test negative. Check carotid Dopplers-right <40% ICA stenosis and left 40-59% ICA stenosis . Possibly secondary to orthostatic hypotension. Management as above. 10. Frequent falls: MRI brain at [facility] without concerning intracranial findings.  Home health therapy arranged. Repeat CT head: Negative. 11. Type II DM: SSI. A1c 6.3 on 5/12.now uncontrolled-secondary to steroids.  Steroids started for possible gout will be tapered rapidly. 12. History of CAD status post PCI: Asymptomatic. Continue aspirin. EKG 5/14 without acute changes. 13. History of AAA status post stent: OP follow up. Increased in size on CT compared to 2012. 14. Tobacco abuse: Cessation counseled. Nicotine patch. 15. History of Barrett's esophagus/recently diagnosed lymphocytic colitis : Recent colonoscopy. Off budesonide since Friday per OP M.D. Recommendation. 16. Right first toe pain/? Gout/hyperuricemia : Osteoarthritis versus gout. No prior history of gout.X-ray did not show any acute fracture. Chronic right second metatarsal fracture. Check uric acid-13.7 . Unable to use NSAIDs or colchicine secondary to renal insufficiency and colitis. Short course of oral steroids.  Resolved. Consider medications for hyperuricemia once acute phase has resolved. 17. Lymphocytic colitis: No diarrhea.   Procedures:   CT guided biopsy of RUL lung mass by interventional radiology on 03/15/13   Consultations:  Interventional radiology  Pulmonology  Discharge Exam:  Complaints:  Patient denied complaints. No dizziness, lightheadedness or symptoms like he wants to pass out.  Filed Vitals:   03/18/13 0448 03/18/13 0520 03/18/13 0521 03/18/13 0523  BP: 185/84 176/90 117/70 115/53  Pulse: 71 73 80 92  Temp: 97.8 F (36.6 C)     TempSrc: Oral     Resp: 19 20 20 20   Height:      Weight:      SpO2: 96% 98% 99% 98%     General exam: elderly frail male patient, No obvious  distress.  Respiratory system: Clear. No increased work of breathing.   Cardiovascular system: S1 & S2 heard, RRR. No JVD, murmurs, gallops, clicks or pedal edema.   Gastrointestinal system: Abdomen is nondistended, soft and nontender. Normal bowel sounds heard.   Central nervous system: Alert and oriented. No focal neurological deficits.   Extremities: Symmetric 5 x 5 power. Right first metatarsophalangeal joint mildly swollen and tender but no redness or increased warmth-resolved .   Discharge Instructions      Discharge Orders   Future Appointments Provider Department Dept Phone   04/17/2013 10:45 AM Iva Boop, MD [facility] [phone]   05/22/2013 9:30 AM Tresa Garter, MD [facility] [phone]   04/19/2014 8:30 AM [facility] [phone]   Eat a light meal the night before the exam but please avoid gaseous foods.   Nothing to eat or drink for at least 8 hours prior to the exam. No gum chewing or smoking the morning of the exam. Please take your morning medications with small sips of water, especially blood pressure medication. If you have several vascular lab exams and will see physician, please bring a snack with you.   04/19/2014 9:00 AM Evern Bio, NP [facility] [phone]   Future Orders Complete By Expires     Call MD for:  difficulty breathing, headache or visual disturbances  As directed     Call MD for:  extreme fatigue  As directed     Call MD for:  persistant dizziness or light-headedness  As directed     Call MD for:  severe uncontrolled pain  As directed     Call MD for:  temperature >100.4  As directed     Diet - low sodium heart healthy  As directed     Diet Carb Modified  As directed     Increase activity slowly  As directed         Medication List    STOP taking these medications       Budesonide 9 MG Tb24       TAKE these medications       ALPRAZolam 0.5 MG tablet  Commonly known as:  XANAX  Take 1 tablet (0.5 mg total) by mouth 2 (two) times daily as needed for sleep or anxiety.     aspirin EC 81 MG tablet  Take 81 mg by mouth every evening.     atorvastatin 40 MG tablet  Commonly known as:  LIPITOR  Take 40 mg by mouth every morning.     busPIRone 15 MG tablet  Commonly known as:  BUSPAR  TAKE 1/2 TABLET BY MOUTH TWICE A DAY     Cholecalciferol 1000 UNITS tablet  Take 1,000 Units by mouth daily.     cloNIDine 0.1 MG tablet  Commonly known as:  CATAPRES  Take 0.1 mg by mouth 2 (two) times daily.     diphenoxylate-atropine 2.5-0.025 MG per tablet  Commonly known as:  LOMOTIL  Take 1 tablet by mouth 4 (four) times daily as needed for diarrhea or loose stools.     feeding supplement Liqd  Take 1 Container by mouth 2 (two) times daily at 10 AM and 5 PM.     lactose free nutrition Liqd  Take 237 mLs by mouth daily.     gabapentin 300 MG capsule  Commonly known as:  NEURONTIN  Take 300 mg by mouth 3 (three) times daily.     glimepiride 1 MG tablet  Commonly known as:  AMARYL  Take 1 mg by mouth daily before breakfast.     labetalol 300 MG tablet  Commonly known as:  NORMODYNE  Take 300 mg by mouth 2 (two) times daily.     levofloxacin 500 MG tablet  Commonly known as:  LEVAQUIN  Take 1 tablet (500 mg total) by mouth every other day. Start 03/19/2013.  Start taking on:  03/19/2013     multivitamin with minerals Tabs  Take 1 tablet by mouth daily.     nicotine 14 mg/24hr patch  Commonly known as:  NICODERM CQ - dosed in mg/24 hours  Place 1 patch onto the skin daily.     nortriptyline 10 MG capsule  Commonly known as:  PAMELOR  Take 20 mg by mouth at bedtime.     omeprazole 40 MG capsule  Commonly known as:  PRILOSEC  Take 80 mg by mouth daily.     PARoxetine 20 MG tablet  Commonly known as:  PAXIL  Take 20 mg by mouth 2 (two) times daily.     predniSONE 10 MG  tablet  Commonly known as:  DELTASONE  Take 2 tabs daily for 1 day, then 1 tab daily for 2 days, then STOP. Start 03/19/2013.     promethazine 12.5 MG tablet  Commonly known as:  PHENERGAN  Take 1-2 tablets (12.5-25 mg total) by mouth every 8 (eight) hours as needed for nausea.     sodium bicarbonate 650 MG tablet  Take 1,300 mg by mouth daily.     temazepam 15 MG capsule  Commonly known as:  RESTORIL  Take 1-2 capsules (15-30 mg total) by mouth at bedtime as needed for sleep.       Follow-up Information   Follow up with Sonda Primes, MD. Schedule an appointment as soon as possible for a visit in 4 days.   Contact information:   [address] [place] Schwalbe [place] [address] [phone]       Follow up with [facility]. (Call on 03/20/2013 to make follow up appointment.Marland Kitchen)    Contact information:   [address] [place] [address] [phone]       The results of significant diagnostics from this hospitalization (including imaging, microbiology, ancillary and laboratory) are listed below for reference.    Significant Diagnostic Studies: Ct Abdomen Pelvis Wo Contrast  03/14/2013   *RADIOLOGY REPORT*  Clinical Data: Lung mass, abnormal LFTs  CT ABDOMEN AND PELVIS WITHOUT CONTRAST  Technique:  Multidetector CT imaging of the abdomen and pelvis was performed following the standard protocol without intravenous contrast.  Comparison: 12/03/2010  Findings: Lung bases are clear.  Small hiatal hernia.  Surgical clips near the GE junction.  Unenhanced liver, spleen, pancreas, and right adrenal gland are within normal limits.  Mild nodular thickening of the left adrenal gland.  Possible tiny layering gallstone (series 2/image 22).  No associated inflammatory changes.  Left renal atrophy. Dated bilateral renal cysts measuring up to 1.8 cm in the left lower pole.  No renal calculi or hydronephrosis.  No evidence of bowel obstruction.  Normal appendix.  4.5 x 4.9 cm  infrarenal abdominal aortic aneurysm, previously measuring 3.8 x 3.8 cm, with indwelling aortobi-iliac stent. Specifically, there is increased soft tissue along the left lateral aspect of the aneurysm sac (series 2/image 30).  Please note that an endoleak cannot be excluded on unenhanced CT.  No abdominopelvic ascites.  No suspicious abdominopelvic lymphadenopathy.  Prostate is unremarkable.  Bladder is within normal limits.  Mild degenerative changes of the visualized thoracolumbar spine.  IMPRESSION: No evidence of metastatic disease in the abdomen/pelvis on unenhanced CT. Surgically, the unenhanced liver is grossly unremarkable.  Suspected tiny layering gallstone.  No associated inflammatory changes.  Left renal atrophy.  Suspected bilateral renal cysts.  4.5 x 4.9 cm infrarenal abdominal aortic aneurysm, as described above, increased since 2012 with indwelling aortobi-iliac stent.   Original Report Authenticated By: Charline Bills, M.D.   Dg Chest 1 View  03/15/2013   *RADIOLOGY REPORT*  Clinical Data: Right upper lobe mass, post biopsy.  CHEST - 1 VIEW  Comparison: 03/13/2013  Findings: Right upper lobe mass is again noted.  No pneumothorax following biopsy.  Mild hyperinflation of the lungs and interstitial prominence.  Heart is normal size.  No effusions or acute bony abnormality.  IMPRESSION: Right upper lobe mass, COPD.  No pneumothorax following biopsy.   Original Report Authenticated By: Charlett Nose, M.D.   Dg Chest 2 View  03/13/2013   *RADIOLOGY REPORT*  Clinical Data: Cough and fever  CHEST - 2 VIEW  Comparison: Chest radiograph 12/04/2010  Findings: Normal cardiac silhouette.  There is a rounded mass within the right upper lobe abutting the pleural surface measuring up to 7.5 cm.  Lungs are hyperinflated.  prominent nipple shadow noted on the left.  Aortic stent graft noted on the lateral projection in the infrarenal abdominal aorta.  IMPRESSION:  Peripheral mass in the right upper lobe is  concerning for neoplasm versus pneumonia.  Recommend CT thorax with contrast for further evaluation.  These results will  be called to the ordering clinician or representative by the Radiologist Assistant, and communication documented in the PACS Dashboard.   Original Report Authenticated By: Genevive Bi, M.D.   Ct Head Wo Contrast  03/17/2013   *RADIOLOGY REPORT*  Clinical Data: Altered mental status, lethargic, lung mass.  CT HEAD WITHOUT CONTRAST  Technique:  Contiguous axial images were obtained from the base of the skull through the vertex without contrast.  Comparison: None.  Findings: There is no evidence for acute infarction, intracranial hemorrhage, mass lesion, hydrocephalus, or extra-axial fluid.  Mild atrophy.  Moderate chronic microvascular ischemic change. Calvarium intact.  Clear sinuses and mastoids.  Vascular calcification.  IMPRESSION: Atrophy and small vessel disease.  No visible intracranial mass lesion or stroke.  No osseous destructive changes are observed.   Original Report Authenticated By: Davonna Belling, M.D.   Ct Chest Wo Contrast  03/14/2013   *RADIOLOGY REPORT*  Clinical Data: Fever and weakness for 1 week.  Diagnosed with pneumonia 1 day ago.  CT CHEST WITHOUT CONTRAST  Technique:  Multidetector CT imaging of the chest was performed following the standard protocol without IV contrast.  Comparison: PA and lateral chest 12/04/2010 and 03/13/2013.  Findings: No pleural or pericardial effusion is identified.  There is no axillary, hilar or mediastinal lymphadenopathy.  Calcific coronary and aortic atherosclerosis is noted.  Heart size is upper normal.  The patient has a very small hiatal hernia.  There is a mass in the periphery of the posterior segment of the right upper lobe.  The lesion measures 5.4 cm cranial-caudal by 4.7 cm AP by 4.1 cm transverse.  There is an area of central necrosis within the lesion.  The mass is contiguous with the pleural of the right chest wall.  The  lungs are otherwise unremarkable.  Incidentally imaged upper abdomen demonstrates surgical clips in the central aspect of the upper abdomen as seen on prior study. Visualized intra-abdominal contents are otherwise unremarkable.  No focal bony abnormality is identified.  IMPRESSION:  1.  Right upper lobe mass contiguous with the pleura of the periphery of the posterior right upper lobe is highly suspicious for carcinoma.  No lymphadenopathy or other evidence of metastatic disease identified. 2.  Calcific coronary aortic atherosclerosis. 3.  Very small hiatal hernia.   Original Report Authenticated By: Holley Dexter, M.D.   US Abdomen Complete  03/16/2013   *RADIOLOGY REPORT*  Clinical Data:  Abnormal LFTs, diabetes, hypertension, COPD, smoker, coronary artery disease post MI  ULTRASOUND ABDOMEN:  Technique:  Sonography of upper abdominal structures was performed.  Comparison:  CT abdomen and pelvis 03/14/2013  Gallbladder:  Questionable tiny 3 mm calcification versus air of the fact at gallbladder neck.  No gallbladder wall thickening, pericholecystic fluid or sonographic Murphy's sign.  Common bile duct:  5 mm diameter, normal.  Distal CBD obscured by bowel gas  Liver:  Normal appearance  IVC:  Inadequately visualized due to poor sound transmission and body habitus  Pancreas:  Obscured by bowel gas  Spleen:  Normal appearance, 4.7 cm length  Right kidney:  11.1 cm length.  Normal morphology without mass or hydronephrosis.  Left kidney:  8.4 cm length.  Cortical thinning.  Hypoechoic focus at inferior pole 2.0 x 1.8 1.8 cm likely small peripelvic cyst, mildly complicated.  No definite shadowing calculi or hydronephrosis.  Aorta:  Normal caliber  Other:  No free fluid  IMPRESSION: Questionable gallstone versus artifact at gallbladder neck without evidence of acute cholecystitis or biliary obstruction. Mildly complicated  peripelvic cyst inferior pole left kidney 2.0 cm greatest size.   Original Report  Authenticated By: Ulyses Southward, M.D.   Ct Biopsy  03/15/2013   *RADIOLOGY REPORT*  Indication: Indeterminate right upper lobe partially cavitary pulmonary mass worrisome for bronchogenic carcinoma.  CT GUIDED RIGHT UPPER LOBE NODULE CORE NEEDLE BIOPSY  Comparisons: Chest CT - 03/14/2013; CT abdomen pelvis - 03/14/2013  Intravenous medications: Fentanyl 50 mcg IV; Versed 1 mg IV  Contrast: None  Sedation time: 18 minutes  Complications: None immediate  TECHNIQUE/FINDINGS:  Informed consent was obtained from the patient following an explanation of the procedure, risks, benefits and alternatives. The patient understands, agrees and consents for the procedure. All questions were addressed.  A time out was performed prior to the initiation of the procedure.  The patient was positioned in varying degrees of supine slightly LPO on the CT table and a limited chest CT was performed for procedural planning demonstrating the grossly unchanged approximate 4.1 x 4.9 cm partially cavitary mass within the right upper lobe with associated adjacent ground-glass atelectasis (image 10, series 2).  The operative site was prepped and draped in the usual sterile fashion.  Under sterile conditions and local anesthesia, a 17 gauge coaxial needle was advanced into the peripheral aspect of the mass. Positioning was confirmed with intermittent CT fluoroscopy followed by the acquisition of 3 with an 18 gauge core needle biopsy device.  Limited post procedural chest CT was negative for pneumothorax or additional complication.  The co-axial needle was removed and hemostasis was achieved with manual compression.  A dressing was placed.  The patient tolerated the procedure well without immediate postprocedural complication.  The patient was escorted to have an upright chest radiograph.  IMPRESSION:  Technically successful CT guided core needle biopsy of indeterminate right upper lobe pulmonary mass.   Original Report Authenticated By: Tacey Ruiz, MD   Dg Foot Complete Right  03/16/2013   *RADIOLOGY REPORT*  Clinical Data: Right great toe pain.  Rule out fracture  RIGHT FOOT COMPLETE - 3+ VIEW  Comparison: None  Findings: Chronic healed fracture of the second metatarsal in the mid shaft.  No acute fracture.  Negative for arthropathy.  IMPRESSION: Chronic healed fracture second metatarsal.  No acute fracture.   Original Report Authenticated By: Janeece Riggers, M.D.    Microbiology: Recent Results (from the past 240 hour(s))  CULTURE, BLOOD (ROUTINE X 2)     Status: None   Collection Time    03/14/13  2:55 PM      Result Value Range Status   Specimen Description BLOOD LEFT ARM   Final   Special Requests BOTTLES DRAWN AEROBIC AND ANAEROBIC LEFT HAND   Final   Culture  Setup Time 03/14/2013 22:48   Final   Culture     Final   Value:        BLOOD CULTURE RECEIVED NO GROWTH TO DATE CULTURE WILL BE HELD FOR 5 DAYS BEFORE ISSUING A FINAL NEGATIVE REPORT   Report Status PENDING   Incomplete  CULTURE, BLOOD (ROUTINE X 2)     Status: None   Collection Time    03/14/13  3:08 PM      Result Value Range Status   Specimen Description BLOOD LEFT ARM   Final   Special Requests BOTTLES DRAWN AEROBIC AND ANAEROBIC 10CC   Final   Culture  Setup Time 03/14/2013 22:48   Final   Culture     Final   Value:  BLOOD CULTURE RECEIVED NO GROWTH TO DATE CULTURE WILL BE HELD FOR 5 DAYS BEFORE ISSUING A FINAL NEGATIVE REPORT   Report Status PENDING   Incomplete  CLOSTRIDIUM DIFFICILE BY PCR     Status: None   Collection Time    03/15/13  4:49 PM      Result Value Range Status   C difficile by pcr NEGATIVE  NEGATIVE Final     Labs: Basic Metabolic Panel:  Recent Labs Lab 03/13/13 1356 03/14/13 1131 03/15/13 0220 03/16/13 0435 03/17/13 0430 03/18/13 0520  NA 134* 135 133* 138 139 140  K 4.6 3.9 3.9 3.0* 3.4* 3.3*  CL 99 107 97 96 96 98  CO2 17*  --  15* 23 26 25   GLUCOSE 130* 117* 115* 113* 156* 134*  BUN 64* 71* 66* 62* 56* 57*   CREATININE 3.6* 3.30* 3.07* 2.69* 2.41* 2.27*  CALCIUM 8.3*  --  8.3* 7.5* 7.6* 7.9*   Liver Function Tests:  Recent Labs Lab 03/13/13 1356 03/15/13 0220 03/16/13 0435 03/17/13 0430 03/18/13 0520  AST 82* 68* 64* 58* 115*  ALT 138* 115* 97* 89* 148*  ALKPHOS 209* 259* 231* 223* 213*  BILITOT 0.7 0.3 0.2* 0.2* 0.2*  PROT 8.8* 7.9 7.2 7.2 7.3  ALBUMIN 3.1* 3.0* 2.5* 2.5* 2.6*    Recent Labs Lab 03/13/13 1356  LIPASE 21.0   No results found for this basename: AMMONIA,  in the last 168 hours CBC:  Recent Labs Lab 03/13/13 1356 03/14/13 1052 03/14/13 1131 03/15/13 0220 03/16/13 0435 03/17/13 0430  WBC 24.1 Repeated and verified X2.* 21.6*  --  20.6* 17.6* 17.5*  NEUTROABS 21.2* 17.5*  --   --   --   --   HGB 10.6* 9.4* 10.5* 9.6* 8.4* 8.4*  HCT 31.4* 28.2* 31.0* 29.3* 26.5* 26.5*  MCV 88.7 90.1  --  90.4 89.2 89.5  PLT 371.0 403*  --  357 319 344   Cardiac Enzymes:  Recent Labs Lab 03/14/13 1455 03/14/13 2027 03/15/13 0220  TROPONINI <0.30 <0.30 <0.30   BNP: BNP (last 3 results) No results found for this basename: PROBNP,  in the last 8760 hours CBG:  Recent Labs Lab 03/17/13 0730 03/17/13 1138 03/17/13 1640 03/17/13 2142 03/18/13 0743  GLUCAP 200* 189* 244* 130* 114*    Additional labs:  Anemia panel: Iron 18, TIBC 224, ferritin 343, folate >20, B12: 567, reticulocyte count: 32  TSH: 0.932  Hemoglobin A1c: 6.3   Acute hepatitis panel: Negative  UA: Negative   Signed:  Tyrian Peart  [facility] 03/18/2013, 10:48 AM

## 2013-03-20 ENCOUNTER — Telehealth: Payer: Self-pay | Admitting: Internal Medicine

## 2013-03-20 ENCOUNTER — Telehealth: Payer: Self-pay

## 2013-03-20 ENCOUNTER — Ambulatory Visit: Payer: Medicare Other | Admitting: Internal Medicine

## 2013-03-20 LAB — CULTURE, BLOOD (ROUTINE X 2)
Culture: NO GROWTH
Culture: NO GROWTH

## 2013-03-20 NOTE — Telephone Encounter (Signed)
Pt called req refill for Promethazine 12.5mg . Please send to [facility] if this is ok.

## 2013-03-20 NOTE — Telephone Encounter (Signed)
Recommendations for Outpatient Follow-up:  1. Dr. Sonda Primes, PCP in 5 days. 2. [facility]- call on 03/20/2013 for appointment. 3. Home Health PT, OT & Rolling walker with 5" wheels. 4. Thigh high compression stocking  ----- Dr. Molli Knock saw pt in the hospital.  I called pt and NA [facility]

## 2013-03-21 ENCOUNTER — Telehealth: Payer: Self-pay | Admitting: Internal Medicine

## 2013-03-21 ENCOUNTER — Encounter: Payer: Self-pay | Admitting: Internal Medicine

## 2013-03-21 ENCOUNTER — Telehealth: Payer: Self-pay | Admitting: Pulmonary Disease

## 2013-03-21 LAB — QUANTIFERON TB GOLD ASSAY (BLOOD)
Mitogen value: 0.01 IU/mL
Quantiferon Nil Value: 0.01 IU/mL
TB Antigen Minus Nil Value: 0 IU/mL

## 2013-03-21 MED ORDER — LEVOFLOXACIN 500 MG PO TABS: 500.0000 mg | ORAL_TABLET | ORAL | Status: DC

## 2013-03-21 NOTE — Telephone Encounter (Signed)
Called pharmacy to make sure they received Erx that was sent on 03/20/13. Pt did pick up Rx on 03/20/13.

## 2013-03-21 NOTE — Telephone Encounter (Signed)
ATC pt NA wcb 

## 2013-03-21 NOTE — Telephone Encounter (Signed)
Spoke with pt and verified appt with Dr Kendrick Fries on 04-10-13.

## 2013-03-21 NOTE — Telephone Encounter (Signed)
Dr Kendrick Fries can review cxr for visit with Dr [name] and decide if he needs to repeat or not.  Pt informed.

## 2013-03-21 NOTE — Telephone Encounter (Signed)
D/c summary says abx x 1 wk and repeat CT in 6 wks. I'll call in 2 more wks of abx. RTC in 1-2 wks to see me pls Thx

## 2013-03-21 NOTE — Telephone Encounter (Signed)
Pt wants a refill on Levofloxacin 500.  He came home from the hospital with an rx for 3 pills.  He takes one every other day.  He says he is to take these for 6 weeks.  He moved his appt to next week.

## 2013-03-22 ENCOUNTER — Ambulatory Visit: Payer: Medicare Other | Admitting: Internal Medicine

## 2013-03-22 ENCOUNTER — Emergency Department (HOSPITAL_COMMUNITY): Payer: Medicare Other

## 2013-03-22 ENCOUNTER — Inpatient Hospital Stay (HOSPITAL_COMMUNITY)
Admission: EM | Admit: 2013-03-22 | Discharge: 2013-03-29 | DRG: 682 | Disposition: A | Discharge status: Skilled Nursing Facility | Payer: Medicare Other | Attending: Internal Medicine | Admitting: Internal Medicine

## 2013-03-22 ENCOUNTER — Telehealth: Payer: Self-pay | Admitting: Internal Medicine

## 2013-03-22 ENCOUNTER — Telehealth: Payer: Self-pay

## 2013-03-22 DIAGNOSIS — F329 Major depressive disorder, single episode, unspecified: Secondary | ICD-10-CM | POA: Diagnosis present

## 2013-03-22 DIAGNOSIS — F17200 Nicotine dependence, unspecified, uncomplicated: Secondary | ICD-10-CM | POA: Diagnosis present

## 2013-03-22 DIAGNOSIS — J439 Emphysema, unspecified: Secondary | ICD-10-CM | POA: Diagnosis present

## 2013-03-22 DIAGNOSIS — R634 Abnormal weight loss: Secondary | ICD-10-CM

## 2013-03-22 DIAGNOSIS — N179 Acute kidney failure, unspecified: Principal | ICD-10-CM | POA: Diagnosis present

## 2013-03-22 DIAGNOSIS — E162 Hypoglycemia, unspecified: Secondary | ICD-10-CM

## 2013-03-22 DIAGNOSIS — N189 Chronic kidney disease, unspecified: Secondary | ICD-10-CM

## 2013-03-22 DIAGNOSIS — K2270 Barrett's esophagus without dysplasia: Secondary | ICD-10-CM | POA: Diagnosis present

## 2013-03-22 DIAGNOSIS — N259 Disorder resulting from impaired renal tubular function, unspecified: Secondary | ICD-10-CM

## 2013-03-22 DIAGNOSIS — I129 Hypertensive chronic kidney disease with stage 1 through stage 4 chronic kidney disease, or unspecified chronic kidney disease: Secondary | ICD-10-CM | POA: Diagnosis present

## 2013-03-22 DIAGNOSIS — E876 Hypokalemia: Secondary | ICD-10-CM | POA: Diagnosis present

## 2013-03-22 DIAGNOSIS — E43 Unspecified severe protein-calorie malnutrition: Secondary | ICD-10-CM | POA: Diagnosis present

## 2013-03-22 DIAGNOSIS — Z66 Do not resuscitate: Secondary | ICD-10-CM | POA: Diagnosis present

## 2013-03-22 DIAGNOSIS — R531 Weakness: Secondary | ICD-10-CM

## 2013-03-22 DIAGNOSIS — R627 Adult failure to thrive: Secondary | ICD-10-CM | POA: Diagnosis present

## 2013-03-22 DIAGNOSIS — I951 Orthostatic hypotension: Secondary | ICD-10-CM

## 2013-03-22 DIAGNOSIS — D631 Anemia in chronic kidney disease: Secondary | ICD-10-CM | POA: Diagnosis present

## 2013-03-22 DIAGNOSIS — M25529 Pain in unspecified elbow: Secondary | ICD-10-CM

## 2013-03-22 DIAGNOSIS — L570 Actinic keratosis: Secondary | ICD-10-CM

## 2013-03-22 DIAGNOSIS — E785 Hyperlipidemia, unspecified: Secondary | ICD-10-CM | POA: Diagnosis present

## 2013-03-22 DIAGNOSIS — E860 Dehydration: Secondary | ICD-10-CM

## 2013-03-22 DIAGNOSIS — R296 Repeated falls: Secondary | ICD-10-CM

## 2013-03-22 DIAGNOSIS — D649 Anemia, unspecified: Secondary | ICD-10-CM | POA: Diagnosis present

## 2013-03-22 DIAGNOSIS — J449 Chronic obstructive pulmonary disease, unspecified: Secondary | ICD-10-CM | POA: Diagnosis present

## 2013-03-22 DIAGNOSIS — J189 Pneumonia, unspecified organism: Secondary | ICD-10-CM

## 2013-03-22 DIAGNOSIS — M109 Gout, unspecified: Secondary | ICD-10-CM | POA: Diagnosis present

## 2013-03-22 DIAGNOSIS — G4700 Insomnia, unspecified: Secondary | ICD-10-CM

## 2013-03-22 DIAGNOSIS — R222 Localized swelling, mass and lump, trunk: Secondary | ICD-10-CM | POA: Diagnosis present

## 2013-03-22 DIAGNOSIS — N183 Chronic kidney disease, stage 3 unspecified: Secondary | ICD-10-CM | POA: Diagnosis present

## 2013-03-22 DIAGNOSIS — R918 Other nonspecific abnormal finding of lung field: Secondary | ICD-10-CM | POA: Diagnosis present

## 2013-03-22 DIAGNOSIS — R5383 Other fatigue: Secondary | ICD-10-CM | POA: Diagnosis present

## 2013-03-22 DIAGNOSIS — I714 Abdominal aortic aneurysm, without rupture: Secondary | ICD-10-CM

## 2013-03-22 DIAGNOSIS — K219 Gastro-esophageal reflux disease without esophagitis: Secondary | ICD-10-CM | POA: Diagnosis present

## 2013-03-22 DIAGNOSIS — K529 Noninfective gastroenteritis and colitis, unspecified: Secondary | ICD-10-CM

## 2013-03-22 DIAGNOSIS — D72829 Elevated white blood cell count, unspecified: Secondary | ICD-10-CM

## 2013-03-22 DIAGNOSIS — IMO0002 Reserved for concepts with insufficient information to code with codable children: Secondary | ICD-10-CM

## 2013-03-22 DIAGNOSIS — F411 Generalized anxiety disorder: Secondary | ICD-10-CM | POA: Diagnosis present

## 2013-03-22 DIAGNOSIS — K22711 Barrett's esophagus with high grade dysplasia: Secondary | ICD-10-CM | POA: Diagnosis present

## 2013-03-22 DIAGNOSIS — I739 Peripheral vascular disease, unspecified: Secondary | ICD-10-CM | POA: Diagnosis present

## 2013-03-22 DIAGNOSIS — D485 Neoplasm of uncertain behavior of skin: Secondary | ICD-10-CM

## 2013-03-22 DIAGNOSIS — W19XXXA Unspecified fall, initial encounter: Secondary | ICD-10-CM

## 2013-03-22 DIAGNOSIS — J4489 Other specified chronic obstructive pulmonary disease: Secondary | ICD-10-CM | POA: Diagnosis present

## 2013-03-22 DIAGNOSIS — K52832 Lymphocytic colitis: Secondary | ICD-10-CM

## 2013-03-22 DIAGNOSIS — R5381 Other malaise: Secondary | ICD-10-CM | POA: Diagnosis present

## 2013-03-22 DIAGNOSIS — R55 Syncope and collapse: Secondary | ICD-10-CM

## 2013-03-22 DIAGNOSIS — F3289 Other specified depressive episodes: Secondary | ICD-10-CM | POA: Diagnosis present

## 2013-03-22 DIAGNOSIS — E875 Hyperkalemia: Secondary | ICD-10-CM

## 2013-03-22 DIAGNOSIS — R110 Nausea: Secondary | ICD-10-CM

## 2013-03-22 DIAGNOSIS — E1159 Type 2 diabetes mellitus with other circulatory complications: Secondary | ICD-10-CM | POA: Diagnosis present

## 2013-03-22 DIAGNOSIS — R509 Fever, unspecified: Secondary | ICD-10-CM

## 2013-03-22 DIAGNOSIS — E872 Acidosis: Secondary | ICD-10-CM

## 2013-03-22 DIAGNOSIS — G253 Myoclonus: Secondary | ICD-10-CM | POA: Diagnosis present

## 2013-03-22 DIAGNOSIS — I2510 Atherosclerotic heart disease of native coronary artery without angina pectoris: Secondary | ICD-10-CM | POA: Diagnosis present

## 2013-03-22 DIAGNOSIS — E1151 Type 2 diabetes mellitus with diabetic peripheral angiopathy without gangrene: Secondary | ICD-10-CM | POA: Diagnosis present

## 2013-03-22 DIAGNOSIS — J441 Chronic obstructive pulmonary disease with (acute) exacerbation: Secondary | ICD-10-CM

## 2013-03-22 LAB — COMPREHENSIVE METABOLIC PANEL
AST: 34 U/L (ref 0–37)
Albumin: 2.7 g/dL — ABNORMAL LOW (ref 3.5–5.2)
Alkaline Phosphatase: 166 U/L — ABNORMAL HIGH (ref 39–117)
BUN: 37 mg/dL — ABNORMAL HIGH (ref 6–23)
Chloride: 98 mEq/L (ref 96–112)
Potassium: 4.2 mEq/L (ref 3.5–5.1)
Sodium: 139 mEq/L (ref 135–145)
Total Bilirubin: 0.3 mg/dL (ref 0.3–1.2)
Total Protein: 7.3 g/dL (ref 6.0–8.3)

## 2013-03-22 LAB — CBC WITH DIFFERENTIAL/PLATELET
Basophils Absolute: 0 10*3/uL (ref 0.0–0.1)
Basophils Relative: 0 % (ref 0–1)
Eosinophils Absolute: 0.3 10*3/uL (ref 0.0–0.7)
Hemoglobin: 9.5 g/dL — ABNORMAL LOW (ref 13.0–17.0)
MCH: 29.3 pg (ref 26.0–34.0)
MCHC: 31.9 g/dL (ref 30.0–36.0)
Monocytes Relative: 5 % (ref 3–12)
Neutro Abs: 16.6 10*3/uL — ABNORMAL HIGH (ref 1.7–7.7)
Neutrophils Relative %: 85 % — ABNORMAL HIGH (ref 43–77)
Platelets: 324 10*3/uL (ref 150–400)
RDW: 13.3 % (ref 11.5–15.5)

## 2013-03-22 MED ORDER — SODIUM CHLORIDE 0.9 % IV SOLN
1000.0000 mL | Freq: Once | INTRAVENOUS | Status: AC
  Administered 2013-03-22: 1000 mL via INTRAVENOUS

## 2013-03-22 MED ORDER — SODIUM CHLORIDE 0.9 % IV SOLN
1000.0000 mL | Freq: Once | INTRAVENOUS | Status: AC
  Administered 2013-03-23: 1000 mL via INTRAVENOUS

## 2013-03-22 MED ORDER — SODIUM CHLORIDE 0.9 % IV BOLUS (SEPSIS): 1000.0000 mL | Freq: Once | INTRAVENOUS | Status: DC

## 2013-03-22 MED ORDER — SODIUM CHLORIDE 0.9 % IV SOLN: 1000.0000 mL | INTRAVENOUS | Status: DC

## 2013-03-22 NOTE — Telephone Encounter (Signed)
I called Mr. [name] to see if he wanted to move his May 28 appt sooner.  He wants to leave it for next week.  He wants to know if he can stop the antibiotic.  He has not appetite and has tremors.  He thinks the medicine is causing it.

## 2013-03-22 NOTE — ED Provider Notes (Signed)
History     CSN: [id number]  Arrival date & time 03/22/13  2025   First MD Initiated Contact with Patient 03/22/13 2158      Chief Complaint  Patient presents with  . Fall   HPI  History provided by the patient and wife. Patient is a 72 year old male with history of hypertension, diabetes, COPD, CAD, AAA, renal insufficiency and right lung mass who presents with symptoms of weakness and a fall. Patient was recently hospitalized for similar episodes of weight loss, weakness and a recent falls. Patient was found to have a left lung mass which was biopsied. Patient was started on antibiotics for the possibility of infection he states he was continued antibiotics at home but has no other changes in medicines. After leaving the hospital he reports having slight tremors in extremities and increasing weakness. He has had poor appetite with very little eating or drinking. He has continued to have falls over the past few days. Today he was getting out of his bed grabbing a walker when he lost balance and fell. He denies any significant injury from a fall. Denies any LOC. He landed onto his left side. No other aggravating or alleviating factors. No other associated symptoms.    Past Medical History  Diagnosis Date  . Diabetes mellitus   . Hypertension   . Anxiety   . COPD (chronic obstructive pulmonary disease)   . Hyperlipemia   . GERD (gastroesophageal reflux disease)   . Barrett's esophagus with high grade dysplasia     s/p RFA  . PVD (peripheral vascular disease)   . CAD (coronary artery disease)   . Tobacco user   . Depression   . Hiatal hernia   . Myocardial infarction 12/1996  . AAA (abdominal aortic aneurysm)   . Renal insufficiency 2010    Past Surgical History  Procedure Laterality Date  . Tonsillectomy    . Nissen fundoplication    . Abdominal aortic aneurysm repair  2010    and right common iliac artery aneurysm, DR HAYES  . Upper gastrointestinal endoscopy  07/07/2011     barretts esophagus, hiatal hernia, gastritis, ablations in esophagus  . Colonoscopy  05/07/2005    diverticulosis, internal and external hemorrhoids  . Abdominal aortic aneurysm repair    . Radiofrequency ablation  multiple    Barrett's, high-grade dysplasia  . Colonoscopy N/A 03/01/2013    Procedure: COLONOSCOPY;  Surgeon: Iva Boop, MD;  Location: [facility];  Service: Endoscopy;  Laterality: N/A;    Family History  Problem Relation Age of Onset  . Coronary artery disease Other   . Diabetes Other   . Asthma Other   . Mental illness Mother     dementia  . Cancer Mother   . Diabetes Mother   . Heart disease Father   . Diabetes Father   . Stomach cancer Father   . Cancer Father   . Colon cancer Neg Hx   . Esophageal cancer Neg Hx     History  Substance Use Topics  . Smoking status: Current Every Day Smoker -- 1.00 packs/day for 54 years    Types: Cigarettes  . Smokeless tobacco: Never Used  . 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 Negative for fever and chills.  HENT: Negative for neck pain.   Neurological: Positive for weakness and light-headedness. Negative for dizziness and headaches.  All other systems  reviewed and are negative.    Allergies  Tramadol; Amlodipine besylate; Benazepril hcl; Penicillins; Risperidone; and Valsartan  Home Medications   Current Outpatient Rx  Name  Route  Sig  Dispense  Refill  . ALPRAZolam (XANAX) 0.5 MG tablet   Oral   Take 1 tablet (0.5 mg total) by mouth 2 (two) times daily as needed for sleep or anxiety.         Marland Kitchen aspirin EC 81 MG tablet   Oral   Take 81 mg by mouth every evening.         Marland Kitchen atorvastatin (LIPITOR) 40 MG tablet   Oral   Take 40 mg by mouth every morning.         . Cholecalciferol 1000 UNITS tablet   Oral   Take 1,000 Units by mouth daily.           . cloNIDine (CATAPRES) 0.1 MG tablet   Oral   Take 0.1 mg by mouth 2  (two) times daily.         Marland Kitchen gabapentin (NEURONTIN) 300 MG capsule   Oral   Take 300 mg by mouth 3 (three) times daily.         Marland Kitchen glimepiride (AMARYL) 1 MG tablet   Oral   Take 1 mg by mouth daily before breakfast.         . labetalol (NORMODYNE) 300 MG tablet   Oral   Take 300 mg by mouth 2 (two) times daily.         . Multiple Vitamin (MULTIVITAMIN WITH MINERALS) TABS   Oral   Take 1 tablet by mouth daily.         . nortriptyline (PAMELOR) 10 MG capsule   Oral   Take 20 mg by mouth at bedtime.         Marland Kitchen omeprazole (PRILOSEC) 40 MG capsule   Oral   Take 80 mg by mouth daily.         Marland Kitchen PARoxetine (PAXIL) 20 MG tablet   Oral   Take 20 mg by mouth 2 (two) times daily.          . promethazine (PHENERGAN) 12.5 MG tablet   Oral   Take 12.5-25 mg by mouth every 8 (eight) hours as needed for nausea.         . sodium bicarbonate 650 MG tablet   Oral   Take 1,300 mg by mouth daily.           . temazepam (RESTORIL) 15 MG capsule   Oral   Take 15-30 mg by mouth at bedtime as needed for sleep.         . diphenoxylate-atropine (LOMOTIL) 2.5-0.025 MG per tablet   Oral   Take 1 tablet by mouth 4 (four) times daily as needed for diarrhea or loose stools.   60 tablet   1   . levofloxacin (LEVAQUIN) 500 MG tablet   Oral   Take 1 tablet (500 mg total) by mouth every other day. Start 03/19/2013.   14 tablet   0     BP 144/87  Pulse 90  Temp(Src) 98.3 F (36.8 C) (Oral)  Resp 18  SpO2 99%  Physical Exam  Nursing note and vitals reviewed. Constitutional: He is oriented to person, place, and time. He appears well-developed and well-nourished. No distress.  HENT:  Head: Normocephalic and atraumatic.  No significant signs of head trauma. No hematomas. No battle sign or raccoon eyes.  Eyes: Conjunctivae and  EOM are normal. Pupils are equal, round, and reactive to light.  Arcus senilis  Neck: Normal range of motion. Neck supple.  No cervical midline  tenderness  Cardiovascular: Normal rate and regular rhythm.   Pulmonary/Chest: Effort normal and breath sounds normal. No respiratory distress. He has no wheezes. He has no rales.  Abdominal: Soft. There is no tenderness. There is no rebound and no guarding.  Musculoskeletal: Normal range of motion. He exhibits no edema.  Neurological: He is alert and oriented to person, place, and time. No cranial nerve deficit.  Strength equal bilaterally. Reports normal sensations to light touch. Patient does have tremor to extremities worsened movement.  Skin: Skin is warm.  Psychiatric: He has a normal mood and affect. His behavior is normal.    ED Course  Procedures    Results for orders placed during the hospital encounter of 03/22/13  CBC WITH DIFFERENTIAL      Result Value Range   WBC 19.6 (*) 4.0 - 10.5 K/uL   RBC 3.24 (*) 4.22 - 5.81 MIL/uL   Hemoglobin 9.5 (*) 13.0 - 17.0 g/dL   HCT 16.1 (*) 09.6 - 04.5 %   MCV 92.0  78.0 - 100.0 fL   MCH 29.3  26.0 - 34.0 pg   MCHC 31.9  30.0 - 36.0 g/dL   RDW 40.9  81.1 - 91.4 %   Platelets 324  150 - 400 K/uL   Neutrophils Relative % 85 (*) 43 - 77 %   Neutro Abs 16.6 (*) 1.7 - 7.7 K/uL   Lymphocytes Relative 9 (*) 12 - 46 %   Lymphs Abs 1.8  0.7 - 4.0 K/uL   Monocytes Relative 5  3 - 12 %   Monocytes Absolute 0.9  0.1 - 1.0 K/uL   Eosinophils Relative 1  0 - 5 %   Eosinophils Absolute 0.3  0.0 - 0.7 K/uL   Basophils Relative 0  0 - 1 %   Basophils Absolute 0.0  0.0 - 0.1 K/uL  COMPREHENSIVE METABOLIC PANEL      Result Value Range   Sodium 139  135 - 145 mEq/L   Potassium 4.2  3.5 - 5.1 mEq/L   Chloride 98  96 - 112 mEq/L   CO2 23  19 - 32 mEq/L   Glucose, Bld 117 (*) 70 - 99 mg/dL   BUN 37 (*) 6 - 23 mg/dL   Creatinine, Ser 7.82 (*) 0.50 - 1.35 mg/dL   Calcium 6.6 (*) 8.4 - 10.5 mg/dL   Total Protein 7.3  6.0 - 8.3 g/dL   Albumin 2.7 (*) 3.5 - 5.2 g/dL   AST 34  0 - 37 U/L   ALT 64 (*) 0 - 53 U/L   Alkaline Phosphatase 166 (*) 39 -  117 U/L   Total Bilirubin 0.3  0.3 - 1.2 mg/dL   GFR calc non Af Amer 20 (*) >90 mL/min   GFR calc Af Amer 23 (*) >90 mL/min       Dg Chest 2 View  03/22/2013   *RADIOLOGY REPORT*  Clinical Data: Fall.  Weakness.  CHEST - 2 VIEW  Comparison: 03/15/2013.  Findings: The cardiac silhouette, mediastinal and hilar contours are within normal limits and stable.  Persistent but smaller cavitary lesion in the right upper lobe.  Remote healed rib fractures noted but no acute bony findings.  IMPRESSION:  1.  Persistent but smaller cavitary right upper lobe lung lesion. 2.  No new/acute pulmonary findings. 3.  Intact  bony thorax.  Remote rib fractures are noted.   Original Report Authenticated By: Rudie Meyer, M.D.   Ct Head Wo Contrast  03/22/2013   *RADIOLOGY REPORT*  Clinical Data:  Larey Seat.  Hit head.  CT HEAD WITHOUT CONTRAST CT CERVICAL SPINE WITHOUT CONTRAST  Technique:  Multidetector CT imaging of the head and cervical spine was performed following the standard protocol without intravenous contrast.  Multiplanar CT image reconstructions of the cervical spine were also generated.  Comparison:  03/17/2013.  CT HEAD  Findings: Stable atrophy and periventricular white matter disease. No acute intracranial findings or mass lesions.  The bony structures are intact.  No acute skull or facial bone fracture.  The paranasal sinuses are clear.  IMPRESSION:  1.  No acute cranial findings.  Stable atrophy and periventricular white matter disease. 2.  No acute skull fracture.  CT CERVICAL SPINE  Findings: The sagittal reformatted images demonstrate normal alignment of the cervical vertebral bodies.  Disc spaces and vertebral bodies are maintained.  No acute bony findings or abnormal prevertebral soft tissue swelling.  The facets are normally aligned.  No facet or laminar fractures are seen. No large disc protrusions.  The neural foramen are patent.  The skull base C1 and C1-C2 articulations are maintained.  The dens is  normal.  There are scattered cervical lymph nodes.  The lung apices are clear.  IMPRESSION: Normal alignment and no acute fracture.   Original Report Authenticated By: Rudie Meyer, M.D.   Ct Cervical Spine Wo Contrast  03/22/2013   *RADIOLOGY REPORT*  Clinical Data:  Larey Seat.  Hit head.  CT HEAD WITHOUT CONTRAST CT CERVICAL SPINE WITHOUT CONTRAST  Technique:  Multidetector CT imaging of the head and cervical spine was performed following the standard protocol without intravenous contrast.  Multiplanar CT image reconstructions of the cervical spine were also generated.  Comparison:  03/17/2013.  CT HEAD  Findings: Stable atrophy and periventricular white matter disease. No acute intracranial findings or mass lesions.  The bony structures are intact.  No acute skull or facial bone fracture.  The paranasal sinuses are clear.  IMPRESSION:  1.  No acute cranial findings.  Stable atrophy and periventricular white matter disease. 2.  No acute skull fracture.  CT CERVICAL SPINE  Findings: The sagittal reformatted images demonstrate normal alignment of the cervical vertebral bodies.  Disc spaces and vertebral bodies are maintained.  No acute bony findings or abnormal prevertebral soft tissue swelling.  The facets are normally aligned.  No facet or laminar fractures are seen. No large disc protrusions.  The neural foramen are patent.  The skull base C1 and C1-C2 articulations are maintained.  The dens is normal.  There are scattered cervical lymph nodes.  The lung apices are clear.  IMPRESSION: Normal alignment and no acute fracture.   Original Report Authenticated By: Rudie Meyer, M.D.     1. Weakness   2. Fall, initial encounter   3. Lung mass   4. Leukocytosis       MDM  Patient seen and evaluated. Patient does not appear in any acute distress. Normal O2 sats. No significant complaints of pain. Denies LOC or syncopal episode.  Labs without significant change from recent hospitalization.   patient was  seen and discussed with attending physician.    Spoke with Dr. Eben Burow with [facility]. He will see patient and admit. Would like temporary holding orders for observation under med surg bed.    Angus Seller, PA-C 03/23/13 0013

## 2013-03-22 NOTE — Telephone Encounter (Signed)
Pt's phone picks up, then hangs up.

## 2013-03-22 NOTE — Telephone Encounter (Signed)
Ok to stop abx Ok to move up his appt Thx

## 2013-03-22 NOTE — ED Notes (Signed)
ZOX:[id number]<AV> Expected date:03/22/13<BR> Expected time: 8:07 PM<BR> Means of arrival:Ambulance<BR> Comments:<BR> Failure to thrive, fall

## 2013-03-22 NOTE — ED Notes (Signed)
Pt states he was here for falls and weakness last week when they found a mass on his lung. Pt fell an hour ago. Said he fell forward and hit his L face. Has not had energy or eating  Or drinking since Saturday.

## 2013-03-22 NOTE — Telephone Encounter (Signed)
Pt's wife notified.

## 2013-03-22 NOTE — Telephone Encounter (Signed)
Phone call from patients wife Brok Stocking. She states she believes the antibiotic pt is on (Levofloxacin 500 mg) is what is causing pt to have no appetite, weak, and tremors. Thursday will be his last day of taking it. She says he is so weak his appt for today was cancelled. Please advise.

## 2013-03-22 NOTE — Telephone Encounter (Signed)
Hold abx x 2-3 d to see if better Take to ER if not doing well and not getting better pls - he may need IV abx, IV fluids Thx

## 2013-03-23 ENCOUNTER — Encounter (HOSPITAL_COMMUNITY): Payer: Self-pay | Admitting: *Deleted

## 2013-03-23 DIAGNOSIS — R627 Adult failure to thrive: Secondary | ICD-10-CM | POA: Diagnosis present

## 2013-03-23 DIAGNOSIS — E162 Hypoglycemia, unspecified: Secondary | ICD-10-CM | POA: Diagnosis present

## 2013-03-23 DIAGNOSIS — E43 Unspecified severe protein-calorie malnutrition: Secondary | ICD-10-CM | POA: Diagnosis present

## 2013-03-23 LAB — BASIC METABOLIC PANEL
CO2: 21 mEq/L (ref 19–32)
Calcium: 5.8 mg/dL — CL (ref 8.4–10.5)
Chloride: 104 mEq/L (ref 96–112)
Creatinine, Ser: 2.73 mg/dL — ABNORMAL HIGH (ref 0.50–1.35)
Glucose, Bld: 55 mg/dL — ABNORMAL LOW (ref 70–99)
Sodium: 141 mEq/L (ref 135–145)

## 2013-03-23 LAB — COMPREHENSIVE METABOLIC PANEL
CO2: 20 mEq/L (ref 19–32)
Calcium: 5.9 mg/dL — CL (ref 8.4–10.5)
Creatinine, Ser: 2.74 mg/dL — ABNORMAL HIGH (ref 0.50–1.35)
GFR calc Af Amer: 25 mL/min — ABNORMAL LOW (ref >90)
GFR calc non Af Amer: 22 mL/min — ABNORMAL LOW (ref >90)
Glucose, Bld: 52 mg/dL — ABNORMAL LOW (ref 70–99)
Total Protein: 6.4 g/dL (ref 6.0–8.3)

## 2013-03-23 LAB — GLUCOSE, CAPILLARY
Glucose-Capillary: 49 mg/dL — ABNORMAL LOW (ref 70–99)
Glucose-Capillary: 74 mg/dL (ref 70–99)
Glucose-Capillary: 57 mg/dL — ABNORMAL LOW (ref 70–99)
Glucose-Capillary: 87 mg/dL (ref 70–99)

## 2013-03-23 LAB — CK: Total CK: 146 U/L (ref 7–232)

## 2013-03-23 MED ORDER — INSULIN ASPART 100 UNIT/ML ~~LOC~~ SOLN: 3.0000 [IU] | Freq: Three times a day (TID) | SUBCUTANEOUS | Status: DC

## 2013-03-23 MED ORDER — PAROXETINE HCL 20 MG PO TABS
20.0000 mg | ORAL_TABLET | Freq: Two times a day (BID) | ORAL | Status: DC
  Administered 2013-03-23 – 2013-03-24 (×4): 20 mg via ORAL
  Filled 2013-03-23 (×5): qty 1

## 2013-03-23 MED ORDER — NEPRO/CARBSTEADY PO LIQD
237.0000 mL | Freq: Three times a day (TID) | ORAL | Status: DC
  Filled 2013-03-23 (×2): qty 237

## 2013-03-23 MED ORDER — ACETAMINOPHEN 650 MG RE SUPP: 650.0000 mg | Freq: Four times a day (QID) | RECTAL | Status: DC | PRN

## 2013-03-23 MED ORDER — ACETAMINOPHEN 325 MG PO TABS: 650.0000 mg | ORAL_TABLET | Freq: Four times a day (QID) | ORAL | Status: DC | PRN

## 2013-03-23 MED ORDER — TEMAZEPAM 15 MG PO CAPS: 15.0000 mg | ORAL_CAPSULE | Freq: Every evening | ORAL | Status: DC | PRN

## 2013-03-23 MED ORDER — BOOST / RESOURCE BREEZE PO LIQD
1.0000 | Freq: Three times a day (TID) | ORAL | Status: DC
  Administered 2013-03-23 – 2013-03-29 (×22): 1 via ORAL

## 2013-03-23 MED ORDER — PANTOPRAZOLE SODIUM 40 MG PO TBEC
40.0000 mg | DELAYED_RELEASE_TABLET | Freq: Every day | ORAL | Status: DC
  Administered 2013-03-23 – 2013-03-25 (×3): 40 mg via ORAL
  Filled 2013-03-23 (×4): qty 1

## 2013-03-23 MED ORDER — ENOXAPARIN SODIUM 30 MG/0.3ML ~~LOC~~ SOLN
30.0000 mg | SUBCUTANEOUS | Status: AC
  Administered 2013-03-23 – 2013-03-25 (×3): 30 mg via SUBCUTANEOUS
  Filled 2013-03-23 (×4): qty 0.3

## 2013-03-23 MED ORDER — GABAPENTIN 300 MG PO CAPS
300.0000 mg | ORAL_CAPSULE | Freq: Three times a day (TID) | ORAL | Status: DC
  Administered 2013-03-23 – 2013-03-24 (×5): 300 mg via ORAL
  Filled 2013-03-23 (×6): qty 1

## 2013-03-23 MED ORDER — BUSPIRONE HCL 15 MG PO TABS
15.0000 mg | ORAL_TABLET | Freq: Two times a day (BID) | ORAL | Status: DC
  Administered 2013-03-23 – 2013-03-24 (×4): 15 mg via ORAL
  Filled 2013-03-23 (×5): qty 1

## 2013-03-23 MED ORDER — ZOLPIDEM TARTRATE 5 MG PO TABS
5.0000 mg | ORAL_TABLET | Freq: Every evening | ORAL | Status: DC | PRN
  Administered 2013-03-23 – 2013-03-28 (×5): 5 mg via ORAL
  Filled 2013-03-23 (×5): qty 1

## 2013-03-23 MED ORDER — SODIUM CHLORIDE 0.9 % IV SOLN
1.0000 g | Freq: Once | INTRAVENOUS | Status: AC
  Administered 2013-03-23: 1 g via INTRAVENOUS
  Filled 2013-03-23: qty 10

## 2013-03-23 MED ORDER — INSULIN ASPART 100 UNIT/ML ~~LOC~~ SOLN
0.0000 [IU] | Freq: Three times a day (TID) | SUBCUTANEOUS | Status: DC
  Administered 2013-03-24: 2 [IU] via SUBCUTANEOUS
  Administered 2013-03-24: 1 [IU] via SUBCUTANEOUS
  Administered 2013-03-25: 3 [IU] via SUBCUTANEOUS
  Administered 2013-03-25: 1 [IU] via SUBCUTANEOUS
  Administered 2013-03-26: 19:00:00 via SUBCUTANEOUS
  Administered 2013-03-26: 3 [IU] via SUBCUTANEOUS
  Administered 2013-03-27: 13:00:00 via SUBCUTANEOUS
  Administered 2013-03-27: 2 [IU] via SUBCUTANEOUS
  Administered 2013-03-29: 1 [IU] via SUBCUTANEOUS

## 2013-03-23 MED ORDER — CALCIUM CARBONATE-VITAMIN D 500-200 MG-UNIT PO TABS
1.0000 | ORAL_TABLET | Freq: Three times a day (TID) | ORAL | Status: DC
  Administered 2013-03-23 – 2013-03-29 (×19): 1 via ORAL
  Filled 2013-03-23 (×21): qty 1

## 2013-03-23 MED ORDER — ASPIRIN EC 81 MG PO TBEC
81.0000 mg | DELAYED_RELEASE_TABLET | Freq: Every evening | ORAL | Status: DC
  Administered 2013-03-23 – 2013-03-28 (×6): 81 mg via ORAL
  Filled 2013-03-23 (×7): qty 1

## 2013-03-23 MED ORDER — LABETALOL HCL 300 MG PO TABS
300.0000 mg | ORAL_TABLET | Freq: Two times a day (BID) | ORAL | Status: DC
  Administered 2013-03-23 – 2013-03-29 (×14): 300 mg via ORAL
  Filled 2013-03-23 (×15): qty 1

## 2013-03-23 MED ORDER — DIPHENOXYLATE-ATROPINE 2.5-0.025 MG PO TABS: 1.0000 | ORAL_TABLET | Freq: Four times a day (QID) | ORAL | Status: DC | PRN

## 2013-03-23 MED ORDER — CALCITRIOL 0.25 MCG PO CAPS
0.2500 ug | ORAL_CAPSULE | Freq: Every day | ORAL | Status: DC
  Administered 2013-03-23 – 2013-03-29 (×7): 0.25 ug via ORAL
  Filled 2013-03-23 (×7): qty 1

## 2013-03-23 MED ORDER — LEVOFLOXACIN 500 MG PO TABS
500.0000 mg | ORAL_TABLET | ORAL | Status: DC
  Filled 2013-03-23: qty 1

## 2013-03-23 MED ORDER — ONDANSETRON HCL 4 MG/2ML IJ SOLN: 4.0000 mg | Freq: Four times a day (QID) | INTRAMUSCULAR | Status: DC | PRN

## 2013-03-23 MED ORDER — SODIUM BICARBONATE 650 MG PO TABS
1300.0000 mg | ORAL_TABLET | Freq: Every day | ORAL | Status: DC
  Administered 2013-03-23 – 2013-03-29 (×7): 1300 mg via ORAL
  Filled 2013-03-23 (×7): qty 2

## 2013-03-23 MED ORDER — ADULT MULTIVITAMIN W/MINERALS CH
1.0000 | ORAL_TABLET | Freq: Every day | ORAL | Status: DC
  Administered 2013-03-23 – 2013-03-29 (×7): 1 via ORAL
  Filled 2013-03-23 (×7): qty 1

## 2013-03-23 MED ORDER — SODIUM CHLORIDE 0.9 % IV SOLN
INTRAVENOUS | Status: DC
  Administered 2013-03-23: 02:00:00 via INTRAVENOUS

## 2013-03-23 MED ORDER — ENOXAPARIN SODIUM 40 MG/0.4ML ~~LOC~~ SOLN: 40.0000 mg | SUBCUTANEOUS | Status: DC

## 2013-03-23 MED ORDER — ALPRAZOLAM 0.5 MG PO TABS
0.5000 mg | ORAL_TABLET | Freq: Two times a day (BID) | ORAL | Status: DC | PRN
  Administered 2013-03-24 – 2013-03-26 (×4): 0.5 mg via ORAL
  Filled 2013-03-23 (×6): qty 1

## 2013-03-23 MED ORDER — ATORVASTATIN CALCIUM 40 MG PO TABS
40.0000 mg | ORAL_TABLET | Freq: Every morning | ORAL | Status: DC
  Administered 2013-03-23 – 2013-03-28 (×6): 40 mg via ORAL
  Filled 2013-03-23 (×7): qty 1

## 2013-03-23 MED ORDER — CLONIDINE HCL 0.1 MG PO TABS
0.1000 mg | ORAL_TABLET | Freq: Two times a day (BID) | ORAL | Status: DC
  Administered 2013-03-23 – 2013-03-29 (×14): 0.1 mg via ORAL
  Filled 2013-03-23 (×15): qty 1

## 2013-03-23 MED ORDER — DEXTROSE-NACL 5-0.9 % IV SOLN
INTRAVENOUS | Status: DC
  Administered 2013-03-23 – 2013-03-25 (×4): via INTRAVENOUS

## 2013-03-23 MED ORDER — ONDANSETRON HCL 4 MG PO TABS
4.0000 mg | ORAL_TABLET | Freq: Four times a day (QID) | ORAL | Status: DC | PRN
  Administered 2013-03-24: 4 mg via ORAL
  Filled 2013-03-23: qty 1

## 2013-03-23 MED ORDER — SODIUM CHLORIDE 0.9 % IV SOLN: 1000.0000 mL | INTRAVENOUS | Status: DC

## 2013-03-23 MED ORDER — NORTRIPTYLINE HCL 10 MG PO CAPS
20.0000 mg | ORAL_CAPSULE | Freq: Every day | ORAL | Status: DC
  Administered 2013-03-23 – 2013-03-28 (×7): 20 mg via ORAL
  Filled 2013-03-23 (×8): qty 2

## 2013-03-23 NOTE — Evaluation (Signed)
Physical Therapy Evaluation Patient Details Name: [name] MRN: [id number] DOB: 1940/12/17 Today's Date: 03/23/2013 Time: 1308-6578 PT Time Calculation (min): 22 min  PT Assessment / Plan / Recommendation Clinical Impression  72 yo male admitted with hypocalcemia, general weakness, FTT. Recent admission ~1 week ago. On eval, pt required Mod assist for mobility-bed mobility and standing. Mobility limited by weakness, tremors. Pt unable to attempt ambulation today. Pt could not even hold cup of water while sitting EOB due to magnitude of tremors. At this time, recommend SNF for continued rehab.     PT Assessment  Patient needs continued PT services    Follow Up Recommendations  SNF;Supervision/Assistance - 24 hour    Does the patient have the potential to tolerate intense rehabilitation      Barriers to Discharge        Equipment Recommendations  None recommended by PT    Recommendations for Other Services OT consult   Frequency Min 3X/week    Precautions / Restrictions Precautions Precautions: High Fall Risk Precaution Comments: pt has tremors throughout body.  Restrictions Weight Bearing Restrictions: No   Pertinent Vitals/Pain No c/o pain      Mobility  Bed Mobility Bed Mobility: Supine to Sit;Sit to Supine Supine to Sit: 3: Mod assist;HOB elevated;With rails Sit to Supine: 4: Min guard Details for Bed Mobility Assistance: Increased time. Assist for bil LEs off bed and trunk to upright.  Transfers Transfers: Sit to Stand;Stand to Sit Sit to Stand: 3: Mod assist;From bed Stand to Sit: 4: Min assist;To bed Details for Transfer Assistance: Assist to rise and attempt to stabilize while standing EOB. Pt with moderate-severe tremors/"shakes" entire time. Pt only able to stand for ~10 seconds before requesting to sit down. Ambulation/Gait Ambulation/Gait Assistance: Not tested (comment) Ambulation/Gait Assistance Details: Pt declined due to tremors and feeling "too  weak"    Exercises     PT Diagnosis: Difficulty walking;Generalized weakness;Abnormality of gait  PT Problem List: Decreased strength;Decreased activity tolerance;Decreased balance;Decreased mobility PT Treatment Interventions: DME instruction;Gait training;Functional mobility training;Therapeutic activities;Therapeutic exercise;Balance training;Patient/family education   PT Goals Acute Rehab PT Goals PT Goal Formulation: With patient Time For Goal Achievement: 04/06/13 Potential to Achieve Goals: Fair Pt will go Supine/Side to Sit: with supervision PT Goal: Supine/Side to Sit - Progress: Goal set today Pt will go Sit to Supine/Side: with supervision PT Goal: Sit to Supine/Side - Progress: Goal set today Pt will go Sit to Stand: with supervision PT Goal: Sit to Stand - Progress: Goal set today Pt will Ambulate: 16 - 50 feet;with supervision;with rolling walker PT Goal: Ambulate - Progress: Goal set today  Visit Information  Last PT Received On: 03/23/13 Assistance Needed: +2 (safety)    Subjective Data  Subjective: I can't do anything. Im too weak Patient Stated Goal: return home   Prior Functioning  Home Living Lives With: Spouse (wife works a few hours 3 days /week) Type of Home: House Home Access: Stairs to enter [employer] of Steps: 1 Home Layout: One level Bathroom Shower/Tub: Tub/shower unit Home Adaptive Equipment: Walker - rolling Prior Function Level of Independence: Needs assistance Needs Assistance: Bathing;Dressing Bath: Moderate Dressing: Moderate Vocation: Retired Comments: independent until ~1 week ago. has not been able to shower safely at home.  Communication Communication: No difficulties    Cognition  Cognition Arousal/Alertness: Awake/alert Behavior During Therapy: WFL for tasks assessed/performed Overall Cognitive Status: Within Functional Limits for tasks assessed    Extremity/Trunk Assessment Right Lower Extremity  Assessment RLE ROM/Strength/Tone:  Deficits RLE ROM/Strength/Tone Deficits: Strength 4/5 throughout.  RLE Sensation: WFL - Light Touch RLE Coordination: Deficits RLE Coordination Deficits: coordination/motor affected significantly by tremors Left Lower Extremity Assessment LLE ROM/Strength/Tone: Deficits LLE ROM/Strength/Tone Deficits: Strength 4/5 throughout LLE Sensation: WFL - Light Touch LLE Coordination: Deficits LLE Coordination Deficits: coordination/motor affected significantly by tremors   Balance Balance Balance Assessed: Yes Static Sitting Balance Static Sitting - Balance Support: Bilateral upper extremity supported;Feet supported Static Sitting - Level of Assistance: 5: Stand by assistance Static Standing Balance Static Standing - Balance Support: Bilateral upper extremity supported Static Standing - Level of Assistance: 3: Mod assist Static Standing - Comment/# of Minutes: pt and walker shaking quite a bit during attempt at standing. Assist to stabilize needed.   End of Session PT - End of Session Activity Tolerance: Patient limited by fatigue;Other (comment) (Limited by tremors ) Patient left: in bed;with call bell/phone within reach;with bed alarm set  GP Functional Assessment Tool Used: clinical judgement Functional Limitation: Mobility: Walking and moving around Mobility: Walking and Moving Around Current Status ([id number]): At least 40 percent but less than 60 percent impaired, limited or restricted Mobility: Walking and Moving Around Goal Status [phone]): At least 1 percent but less than 20 percent impaired, limited or restricted   Rebeca Alert, MPT Pager: [phone]

## 2013-03-23 NOTE — Progress Notes (Signed)
CRITICAL VALUE ALERT  Critical value received:  magnesium  Date of notification:  03/23/13  Time of notification:  1110  Critical value read back:yes  Nurse who received alert:  Orlene Och RN  MD notified (1st page):  Rama - on unit. Verbal notification  Time of first page:  1115 - verbal notifiction  MD notified (2nd page):  Time of second page:  Responding MD:  Rama  Time MD responded:  [phone]

## 2013-03-23 NOTE — Telephone Encounter (Signed)
I spoke with Mr. Shin wife.  He fell last night.  He is back in the hospital.

## 2013-03-23 NOTE — ED Provider Notes (Signed)
Medical screening examination/treatment/procedure(s) were conducted as a shared visit with non-physician practitioner(s) and myself.  I personally evaluated the patient during the encounter  Patient continues to have decreased appetite and anorexia as well as decreased oral intake.  I suspect the majority of his symptoms are related to severe dehydration.  2 L IV fluid bolus now.  Labs demonstrated worsening renal function as compared to his discharge creatinine.  Chest x-ray will be obtained to further evaluate the right upper lobe pathology.  I too am concerned that the patient may have malignancy with anorexia, weight loss, failure to thrive picture.  Observational admission for this patient for ongoing IV hydration.  His renal function will need to be rechecked.  Lyanne Co, MD 03/23/13 [phone]

## 2013-03-23 NOTE — Progress Notes (Signed)
Clinical Social Work Department BRIEF PSYCHOSOCIAL ASSESSMENT 03/23/2013  Patient:  [name], [name]     Account Number:  [id number]     Admit date:  03/22/2013  Clinical Social Worker:  Jacelyn Grip  Date/Time:  03/23/2013 04:00 PM  Referred by:  Physician  Date Referred:  03/23/2013 Referred for  SNF Placement   Other Referral:   Interview type:  Patient Other interview type:    PSYCHOSOCIAL DATA Living Status:  WIFE Admitted from facility:   Level of care:   Primary support name:  Aurea Graff Mcbane/wife Primary support relationship to patient:  SPOUSE Degree of support available:   adequate    CURRENT CONCERNS Current Concerns  Post-Acute Placement   Other Concerns:    SOCIAL WORK ASSESSMENT / PLAN CSW received notification that pt may need ST SNF placement following dc.    CSW reviewed chart and noted that PT recommended SNF at d/c.    CSW met with pt at bedside. Pt reported that he was not feeling well, but agreeable to CSW assessment. CSW discussed recommendation from PT for ST SNF at d/c. Pt reported that he is hesitant about SNF placement, but agrees that he is unable to care for himself at home and care would be too much for pt wife and is agreeable to [facility] search.    CSW completed FL2 and initiated SNF search in [place]. SNF list left at bedside.    CSW contacted pt wife via telephone with pt permission and left voice message.    CSW to follow up with pt and pt family re: bed offers.    CSW to facilitate pt discharge needs when pt medically ready for discharge.   Assessment/plan status:  Psychosocial Support/Ongoing Assessment of Needs Other assessment/ plan:   discharge planning   Information/referral to community resources:   [facility] list    PATIENT'S/FAMILY'S RESPONSE TO PLAN OF CARE: Pt alert and oriented x 4. Pt is agreeable to SNF options, but appeared hesitant about the idea of SNF. Was unable to reach wife via  telephone, message left.     Jacklynn Lewis, MSW, LCSWA  Clinical Social Work [phone]

## 2013-03-23 NOTE — Progress Notes (Addendum)
Clinical Social Work Department CLINICAL SOCIAL WORK PLACEMENT NOTE 03/23/2013  Patient:  [name], [name]  Account Number:  [id number] Admit date:  03/22/2013  Clinical Social Worker:  Jacelyn Grip  Date/time:  03/23/2013 04:30 PM  Clinical Social Work is seeking post-discharge placement for this patient at the following level of care:   SKILLED NURSING   (*CSW will update this form in Epic as items are completed)   03/23/2013  Patient/family provided with [facility] Department of Clinical Social Work's list of facilities offering this level of care within the geographic area requested by the patient (or if unable, by the patient's family).  03/23/2013  Patient/family informed of their freedom to choose among providers that offer the needed level of care, that participate in Medicare, Medicaid or managed care program needed by the patient, have an available bed and are willing to accept the patient.  03/23/2013  Patient/family informed of [facility]' ownership interest in [facility], as well as of the fact that they are under no obligation to receive care at this facility.  PASARR submitted to EDS on 03/23/2013 PASARR number received from EDS on 03/23/2013  FL2 transmitted to all facilities in geographic area requested by pt/family on  03/23/2013 FL2 transmitted to all facilities within larger geographic area on   Patient informed that his/her managed care company has contracts with or will negotiate with  certain facilities, including the following:     Patient/family informed of bed offers received:  03/23/2013 Patient chooses bed at [facility] Physician recommends and patient chooses bed at    Patient to be transferred to  on  [facility] on 03/29/2013 Patient to be transferred to facility by ambulance Sharin Mons)  The following physician request were entered in Epic:   Additional Comments:   Jacklynn Lewis, MSW,  LCSWA  Clinical Social Work [phone]

## 2013-03-23 NOTE — H&P (Signed)
History and Physical  KRISTOPHER ATTWOOD ZOX:[id number] DOB: [birth year] DOA: 03/22/2013  Referring physician: Dr Patria Mane (ER)  PCP: Sonda Primes, MD   Chief Complaint: Failure to thrive  HPI: Patient is a 72 year old male with history of hypertension, diabetes, COPD, CAD, AAA, renal insufficiency and right lung mass who presents with symptoms of weakness and a fall. Patient was recently hospitalized for similar complaints. Patient was found to have a left lung mass which was biopsied during last admission. Patient was started on antibiotics for the possibility of infection which he has continued at home but has no other changes in medicines. After leaving the hospital he reports having slight tremors in extremities and increasing weakness. He has had poor appetite with very little eating or drinking. He has continued to have falls over the past few days. Today he was getting out of his bed grabbing a walker when he lost balance and fell. He denies any significant injury from a fall. Denies any LOC. He landed onto his left side. No other aggravating or alleviating factors. No other associated symptoms.   Chart Review: concern for right upper lobe lung cancer   Review of Systems:  15 point review of system is netaive excpet what is noted above.   Past Medical History  Diagnosis Date  . Diabetes mellitus   . Hypertension   . Anxiety   . COPD (chronic obstructive pulmonary disease)   . Hyperlipemia   . GERD (gastroesophageal reflux disease)   . Barrett's esophagus with high grade dysplasia     s/p RFA  . PVD (peripheral vascular disease)   . CAD (coronary artery disease)   . Tobacco user   . Depression   . Hiatal hernia   . Myocardial infarction 12/1996  . AAA (abdominal aortic aneurysm)   . Renal insufficiency 2010    Past Surgical History  Procedure Laterality Date  . Tonsillectomy    . Nissen fundoplication    . Abdominal aortic aneurysm repair  2010    and right common iliac  artery aneurysm, DR HAYES  . Upper gastrointestinal endoscopy  07/07/2011    barretts esophagus, hiatal hernia, gastritis, ablations in esophagus  . Colonoscopy  05/07/2005    diverticulosis, internal and external hemorrhoids  . Abdominal aortic aneurysm repair    . Radiofrequency ablation  multiple    Barrett's, high-grade dysplasia  . Colonoscopy N/A 03/01/2013    Procedure: COLONOSCOPY;  Surgeon: Iva Boop, MD;  Location: [facility];  Service: Endoscopy;  Laterality: N/A;    Social History:  reports that he has been smoking Cigarettes.  He has a 54 pack-year smoking history. He has never used smokeless tobacco. He reports that he does not drink alcohol or use illicit drugs.  Allergies  Allergen Reactions  . Tramadol Nausea And Vomiting  . Amlodipine Besylate     REACTION: swelling  . Benazepril Hcl     REACTION: elev K  . Penicillins     REACTION: rash, itching  . Risperidone     REACTION: loss of motor skills  . Valsartan     REACTION: elev K    Family History  Problem Relation Age of Onset  . Coronary artery disease Other   . Diabetes Other   . Asthma Other   . Mental illness Mother     dementia  . Cancer Mother   . Diabetes Mother   . Heart disease Father   . Diabetes Father   . Stomach cancer Father   .  Cancer Father   . Colon cancer Neg Hx   . Esophageal cancer Neg Hx      Prior to Admission medications   Medication Sig Start Date End Date Taking? Authorizing Provider  ALPRAZolam Prudy Feeler) 0.5 MG tablet Take 1 tablet (0.5 mg total) by mouth 2 (two) times daily as needed for sleep or anxiety. 03/18/13  Yes Elease Etienne, MD  aspirin EC 81 MG tablet Take 81 mg by mouth every evening.   Yes Historical Provider, MD  atorvastatin (LIPITOR) 40 MG tablet Take 40 mg by mouth every morning.   Yes Historical Provider, MD  Cholecalciferol 1000 UNITS tablet Take 1,000 Units by mouth daily.     Yes Historical Provider, MD  cloNIDine (CATAPRES) 0.1 MG tablet Take 0.1  mg by mouth 2 (two) times daily.   Yes Historical Provider, MD  gabapentin (NEURONTIN) 300 MG capsule Take 300 mg by mouth 3 (three) times daily.   Yes Historical Provider, MD  glimepiride (AMARYL) 1 MG tablet Take 1 mg by mouth daily before breakfast.   Yes Historical Provider, MD  labetalol (NORMODYNE) 300 MG tablet Take 300 mg by mouth 2 (two) times daily. 09/12/12  Yes Tresa Garter, MD  Multiple Vitamin (MULTIVITAMIN WITH MINERALS) TABS Take 1 tablet by mouth daily. 03/18/13  Yes Elease Etienne, MD  nortriptyline (PAMELOR) 10 MG capsule Take 20 mg by mouth at bedtime.   Yes Historical Provider, MD  omeprazole (PRILOSEC) 40 MG capsule Take 80 mg by mouth daily.   Yes Historical Provider, MD  PARoxetine (PAXIL) 20 MG tablet Take 20 mg by mouth 2 (two) times daily.    Yes Historical Provider, MD  promethazine (PHENERGAN) 12.5 MG tablet Take 12.5-25 mg by mouth every 8 (eight) hours as needed for nausea.   Yes Historical Provider, MD  sodium bicarbonate 650 MG tablet Take 1,300 mg by mouth daily.     Yes Historical Provider, MD  temazepam (RESTORIL) 15 MG capsule Take 15-30 mg by mouth at bedtime as needed for sleep.   Yes Historical Provider, MD  diphenoxylate-atropine (LOMOTIL) 2.5-0.025 MG per tablet Take 1 tablet by mouth 4 (four) times daily as needed for diarrhea or loose stools. 02/02/13   Georgina Quint Plotnikov, MD  levofloxacin (LEVAQUIN) 500 MG tablet Take 1 tablet (500 mg total) by mouth every other day. Start 03/19/2013. 03/21/13   Tresa Garter, MD   Physical Exam: Filed Vitals:   03/22/13 2027 03/22/13 2100 03/23/13 0146  BP: 138/80 144/87 176/71  Pulse: 94 90 86  Temp:  98.3 F (36.8 C) 98.8 F (37.1 C)  TempSrc:  Oral Oral  Resp:  18 20  Height:   5\' 9"  (1.753 m)  Weight:   146 lb 9.7 oz (66.5 kg)  SpO2:  99% 97%   Physical Exam: General: Vital signs reviewed and noted. Well-developed, well-nourished, in no acute distress; alert, appropriate and cooperative  throughout examination.  Head: Normocephalic, atraumatic.  Eyes: PERRL, EOMI, No signs of anemia or jaundince.  Nose: Mucous membranes moist, not inflammed, nonerythematous.  Throat: Oropharynx nonerythematous, no exudate appreciated.   Neck: No deformities, masses, or tenderness noted.Supple, No carotid Bruits, no JVD.  Lungs:  Normal respiratory effort. Clear to auscultation BL without crackles or wheezes.  Heart: RRR. S1 and S2 normal without gallop, murmur, or rubs.  Abdomen:  BS normoactive. Soft, Nondistended, non-tender.  No masses or organomegaly.  Extremities: No pretibial edema.  Neurologic: A&O X3, CN II - XII are grossly  intact. Motor strength is 5/5 in the all 4 extremities, Sensations intact to light touch, Cerebellar signs negative.  Skin: No visible rashes, scars.     Wt Readings from Last 3 Encounters:  03/23/13 146 lb 9.7 oz (66.5 kg)  03/16/13 150 lb 1.6 oz (68.085 kg)  03/13/13 149 lb (67.586 kg)    Labs on Admission:  Basic Metabolic Panel:  Recent Labs Lab 03/17/13 0430 03/18/13 0520 03/22/13 2215 03/23/13 0400  NA 139 140 139 141  K 3.4* 3.3* 4.2 3.6  CL 96 98 98 104  CO2 26 25 23 21   GLUCOSE 156* 134* 117* 55*  BUN 56* 57* 37* 33*  CREATININE 2.41* 2.27* 2.98* 2.73*  CALCIUM 7.6* 7.9* 6.6* 5.8*    Liver Function Tests:  Recent Labs Lab 03/17/13 0430 03/18/13 0520 03/22/13 2215  AST 58* 115* 34  ALT 89* 148* 64*  ALKPHOS 223* 213* 166*  BILITOT 0.2* 0.2* 0.3  PROT 7.2 7.3 7.3  ALBUMIN 2.5* 2.6* 2.7*   CBC:  Recent Labs Lab 03/17/13 0430 03/22/13 2215  WBC 17.5* 19.6*  NEUTROABS  --  16.6*  HGB 8.4* 9.5*  HCT 26.5* 29.8*  MCV 89.5 92.0  PLT 344 324    CBG:  Recent Labs Lab 03/17/13 0730 03/17/13 1138 03/17/13 1640 03/17/13 2142 03/18/13 0743  GLUCAP 200* 189* 244* 130* 114*     Radiological Exams on Admission: Dg Chest 2 View  03/22/2013   *RADIOLOGY REPORT*  Clinical Data: Fall.  Weakness.  CHEST - 2 VIEW   Comparison: 03/15/2013.  Findings: The cardiac silhouette, mediastinal and hilar contours are within normal limits and stable.  Persistent but smaller cavitary lesion in the right upper lobe.  Remote healed rib fractures noted but no acute bony findings.  IMPRESSION:  1.  Persistent but smaller cavitary right upper lobe lung lesion. 2.  No new/acute pulmonary findings. 3.  Intact bony thorax.  Remote rib fractures are noted.   Original Report Authenticated By: Rudie Meyer, M.D.   Ct Head Wo Contrast  03/22/2013   *RADIOLOGY REPORT*  Clinical Data:  Larey Seat.  Hit head.  CT HEAD WITHOUT CONTRAST CT CERVICAL SPINE WITHOUT CONTRAST  Technique:  Multidetector CT imaging of the head and cervical spine was performed following the standard protocol without intravenous contrast.  Multiplanar CT image reconstructions of the cervical spine were also generated.  Comparison:  03/17/2013.  CT HEAD  Findings: Stable atrophy and periventricular white matter disease. No acute intracranial findings or mass lesions.  The bony structures are intact.  No acute skull or facial bone fracture.  The paranasal sinuses are clear.  IMPRESSION:  1.  No acute cranial findings.  Stable atrophy and periventricular white matter disease. 2.  No acute skull fracture.  CT CERVICAL SPINE  Findings: The sagittal reformatted images demonstrate normal alignment of the cervical vertebral bodies.  Disc spaces and vertebral bodies are maintained.  No acute bony findings or abnormal prevertebral soft tissue swelling.  The facets are normally aligned.  No facet or laminar fractures are seen. No large disc protrusions.  The neural foramen are patent.  The skull base C1 and C1-C2 articulations are maintained.  The dens is normal.  There are scattered cervical lymph nodes.  The lung apices are clear.  IMPRESSION: Normal alignment and no acute fracture.   Original Report Authenticated By: Rudie Meyer, M.D.   Ct Cervical Spine Wo Contrast  03/22/2013    *RADIOLOGY REPORT*  Clinical Data:  Larey Seat.  Hit  head.  CT HEAD WITHOUT CONTRAST CT CERVICAL SPINE WITHOUT CONTRAST  Technique:  Multidetector CT imaging of the head and cervical spine was performed following the standard protocol without intravenous contrast.  Multiplanar CT image reconstructions of the cervical spine were also generated.  Comparison:  03/17/2013.  CT HEAD  Findings: Stable atrophy and periventricular white matter disease. No acute intracranial findings or mass lesions.  The bony structures are intact.  No acute skull or facial bone fracture.  The paranasal sinuses are clear.  IMPRESSION:  1.  No acute cranial findings.  Stable atrophy and periventricular white matter disease. 2.  No acute skull fracture.  CT CERVICAL SPINE  Findings: The sagittal reformatted images demonstrate normal alignment of the cervical vertebral bodies.  Disc spaces and vertebral bodies are maintained.  No acute bony findings or abnormal prevertebral soft tissue swelling.  The facets are normally aligned.  No facet or laminar fractures are seen. No large disc protrusions.  The neural foramen are patent.  The skull base C1 and C1-C2 articulations are maintained.  The dens is normal.  There are scattered cervical lymph nodes.  The lung apices are clear.  IMPRESSION: Normal alignment and no acute fracture.   Original Report Authenticated By: Rudie Meyer, M.D.     Active Problems:   Neoplasm of uncertain behavior of skin   HYPERLIPIDEMIA   ANXIETY   TOBACCO USE DISORDER/SMOKER-SMOKING CESSATION DISCUSSED   DEPRESSION   CORONARY ARTERY DISEASE   PERIPHERAL VASCULAR DISEASE   COPD   GERD   Barrett's esophagus with high grade dysplasia   DM (diabetes mellitus), type 2 with peripheral vascular complications   Renal failure (ARF), acute on chronic   Failure to thrive in adult   Hypocalcemia   Assessment/Plan Pateint is 72 year old man with PMH most significant for HTN, type 2 diabetes, COPD, CAD, CKD and right  lung mass who continues to smoke and is admitted for generalized weakness and failure to thrive. Patient continues to have decreased appetite and anorexia as well as decreased oral intake. I suspect the majority of his symptoms are related to severe dehydration. 2 L IV fluid bolus given in ER. Labs demonstrated worsening renal function as compared to his discharge creatinine. I too am concerned that the patient may have malignancy with anorexia, weight loss, failure to thrive picture which is further complicated by hypocalcemia. Treatment during hospitalization will include ongoing IV hydration. His renal function will need to be rechecked in morning. I will also replete his calcium and continue to monitor it at this time. Will continue him on his home meds including antibiotic at this time.   Code Status: full Family Communication: none present Disposition Plan/Anticipated LOS: guarded  Time spent: 35 minutes  Lars Mage, MD  [facility] Team 5  If 7PM-7AM, please contact night-coverage at [email or web address], password [facility] 03/23/2013, 6:58 AM

## 2013-03-23 NOTE — Progress Notes (Signed)
CRITICAL VALUE ALERT  Critical value received:  Calcium  5.8  Date of notification:  03/23/13  Time of notification:  0515  Critical value read back:yes  Nurse who received alert:  Jilda Panda  MD notified (1st page):  Donnamarie Poag  Time of first page:  0520  MD notified (2nd page):  Time of second page:  Responding MD: Donnamarie Poag  Time MD responded:  [phone]

## 2013-03-23 NOTE — Progress Notes (Signed)
[facility]  Patient Status: Active (receiving services up to time of hospitalization)  [facility] is providing the following services: PT and OT  If patient discharges after hours, please call [phone].   [name] 03/23/2013, 9:36 AM

## 2013-03-23 NOTE — Progress Notes (Signed)
CRITICAL VALUE ALERT  Critical value received:CBG 57  Date of notification:  52214  Time of notification:  1145  Critical value read back:yes  Nurse who received alert:  Orlene Och RN  MD notified (1st page):  RAMA -verbal notification  Time of first page:  1200  MD notified (2nd page):  Time of second page:  Responding MD: Rama  Time MD responded:  Immediate - on unit.

## 2013-03-23 NOTE — Progress Notes (Signed)
CARE MANAGEMENT NOTE 03/23/2013  Patient:  BALLARD, BUDNEY   Account Number:  [id number]  Date Initiated:  03/23/2013  Documentation initiated by:  Juliyah Mergen,COOKIE  Subjective/Objective Assessment:   pt admitted with cco weakness, falls, decrease in take     Action/Plan:   from home   Anticipated DC Date:  03/25/2013   Anticipated DC Plan:  HOME/SELF CARE  In-house referral  Clinical Social Worker      DC Planning Services  CM consult      Choice offered to / List presented to:             Status of service:  In process, will continue to follow Medicare Important Message given?   (If response is "NO", the following Medicare IM given date fields will be blank) Date Medicare IM given:   Date Additional Medicare IM given:    Discharge Disposition:    Per UR Regulation:  Reviewed for med. necessity/level of care/duration of stay  If discussed at Long Length of Stay Meetings, dates discussed:    Comments:  03/23/13 MMcGibboney, RN, BSN Cahrt reviewed. Pt from home, may benefit with SNF. PT eval pending.

## 2013-03-23 NOTE — Progress Notes (Signed)
[facility] PROGRESS NOTE  GIRARD KOONTZ ZOX:[id number] DOB: 1941/01/26 DOA: 03/22/2013 PCP: Sonda Primes, MD  Brief narrative: [name] is an 72 y.o. male with a PMH of hypertension, diabetes, COPD, CAD, AAA, renal insufficiency and right lung mass status post CT-guided lung biopsy on 03/15/2013 with findings negative for malignancy, who was admitted on 03/23/2013 for evaluation of weakness and a fall.    Assessment/Plan: Principal Problem:   Hypocalcemia with severe weakness -May be secondary to progressive kidney disease. -Followup intact PTH studies. -Check magnesium, vitamin D and phosphorus.  Suspect will need Calcitriol and PhosLo. -Spoke with Dr. Hyman Hopes (his nephrologist) who agrees with current work up and management. -PT/OT evaluations requested. Active Problems:   Severe protein calorie malnutrition -Albumin low. Significant anorexia. Dietitian consultation requested and the patient was placed on Nepro supplements.   HYPERLIPIDEMIA -Continue Lipitor. -Check CK to rule out statin-induced myopathy.   ANXIETY and depression -Continue as needed alprazolam, BuSpar, Pamelor and Paxil.   TOBACCO USE DISORDER/SMOKER -Personally counseled.  Declined nicotine patch.   CORONARY ARTERY DISEASE / PERIPHERAL VASCULAR DISEASE -Continue ASA.   COPD -Respiratory status stable.   GERD / Barrett's esophagus with high grade dysplasia -Continue PPI therapy.   DM (diabetes mellitus), type 2 with peripheral vascular complications / hypoglycemia -Discontinue Amaryl. Discontinue 3 units of NovoLog for meal coverage but continue sliding scale insulin as needed.   Stage III-4 chronic kidney disease -On sodium bicarbonate.   Right Lung mass -CT guided biopsy 03/15/13 non-conclusive. -Opted to treat empirically with antibiotics then have follow up imaging done, but recently stopped empiric Levaquin secondary to problems with tremors and anorexia, thought to be related to this medication  use.   Failure to thrive in adult -Suspect secondary to occult malignancy.   Hypertension -Continue clonidine and labetalol.  Code Status: Full. Family Communication: Wife updated at the bedside. Disposition Plan: Home versus SNF depending on progress.   Medical Consultants:  Telephone consultation with Dr. Elvis Coil, Nephrology.  Other Consultants:  Physical therapy  Occupational therapy  Anti-infectives:  None.  HPI/Subjective: [name] is weak with no appetite. He also has had tremors which he attributes to his recent use of Levaquin. No complaints of pain, dyspnea, or cough.  Objective: Filed Vitals:   03/22/13 2027 03/22/13 2100 03/23/13 0146 03/23/13 0705  BP: 138/80 144/87 176/71 154/74  Pulse: 94 90 86 83  Temp:  98.3 F (36.8 C) 98.8 F (37.1 C) 97.9 F (36.6 C)  TempSrc:  Oral Oral Oral  Resp:  18 20 20   Height:   5\' 9"  (1.753 m)   Weight:   66.5 kg (146 lb 9.7 oz)   SpO2:  99% 97% 96%    Intake/Output Summary (Last 24 hours) at 03/23/13 1018 Last data filed at 03/23/13 0646  Gross per 24 hour  Intake      0 ml  Output    400 ml  Net   -400 ml    Exam: Gen:  NAD Cardiovascular:  RRR, No M/R/G Respiratory:  Lungs CTAB Gastrointestinal:  Abdomen soft, NT/ND, + BS Extremities:  No C/E/C  Data Reviewed: Basic Metabolic Panel:  Recent Labs Lab 03/17/13 0430 03/18/13 0520 03/22/13 2215 03/23/13 0400 03/23/13 0743  NA 139 140 139 141 141  K 3.4* 3.3* 4.2 3.6 3.7  CL 96 98 98 104 103  CO2 26 25 23 21 20   GLUCOSE 156* 134* 117* 55* 52*  BUN 56* 57* 37* 33* 32*  CREATININE  2.41* 2.27* 2.98* 2.73* 2.74*  CALCIUM 7.6* 7.9* 6.6* 5.8* 5.9*   GFR Estimated Creatinine Clearance: 23.3 ml/min (by C-G formula based on Cr of 2.74). Liver Function Tests:  Recent Labs Lab 03/17/13 0430 03/18/13 0520 03/22/13 2215 03/23/13 0743  AST 58* 115* 34 29  ALT 89* 148* 64* 50  ALKPHOS 223* 213* 166* 143*  BILITOT 0.2* 0.2* 0.3 0.2*  PROT  7.2 7.3 7.3 6.4  ALBUMIN 2.5* 2.6* 2.7* 2.3*   CBC:  Recent Labs Lab 03/17/13 0430 03/22/13 2215  WBC 17.5* 19.6*  NEUTROABS  --  16.6*  HGB 8.4* 9.5*  HCT 26.5* 29.8*  MCV 89.5 92.0  PLT 344 324   CBG:  Recent Labs Lab 03/17/13 1640 03/17/13 2142 03/18/13 0743 03/23/13 0826 03/23/13 0846  GLUCAP 244* 130* 114* 49* 74   Microbiology Recent Results (from the past 240 hour(s))  CULTURE, BLOOD (ROUTINE X 2)     Status: None   Collection Time    03/14/13  2:55 PM      Result Value Range Status   Specimen Description BLOOD LEFT ARM   Final   Special Requests BOTTLES DRAWN AEROBIC AND ANAEROBIC LEFT HAND   Final   Culture  Setup Time 03/14/2013 22:48   Final   Culture NO GROWTH 5 DAYS   Final   Report Status 03/20/2013 FINAL   Final  CULTURE, BLOOD (ROUTINE X 2)     Status: None   Collection Time    03/14/13  3:08 PM      Result Value Range Status   Specimen Description BLOOD LEFT ARM   Final   Special Requests BOTTLES DRAWN AEROBIC AND ANAEROBIC 10CC   Final   Culture  Setup Time 03/14/2013 22:48   Final   Culture NO GROWTH 5 DAYS   Final   Report Status 03/20/2013 FINAL   Final  CLOSTRIDIUM DIFFICILE BY PCR     Status: None   Collection Time    03/15/13  4:49 PM      Result Value Range Status   C difficile by pcr NEGATIVE  NEGATIVE Final     Procedures and Diagnostic Studies: Dg Chest 2 View  03/22/2013   *RADIOLOGY REPORT*  Clinical Data: Fall.  Weakness.  CHEST - 2 VIEW  Comparison: 03/15/2013.  Findings: The cardiac silhouette, mediastinal and hilar contours are within normal limits and stable.  Persistent but smaller cavitary lesion in the right upper lobe.  Remote healed rib fractures noted but no acute bony findings.  IMPRESSION:  1.  Persistent but smaller cavitary right upper lobe lung lesion. 2.  No new/acute pulmonary findings. 3.  Intact bony thorax.  Remote rib fractures are noted.   Original Report Authenticated By: Rudie Meyer, M.D.   Ct Head Wo  Contrast  03/22/2013   *RADIOLOGY REPORT*  Clinical Data:  Larey Seat.  Hit head.  CT HEAD WITHOUT CONTRAST CT CERVICAL SPINE WITHOUT CONTRAST  Technique:  Multidetector CT imaging of the head and cervical spine was performed following the standard protocol without intravenous contrast.  Multiplanar CT image reconstructions of the cervical spine were also generated.  Comparison:  03/17/2013.  CT HEAD  Findings: Stable atrophy and periventricular white matter disease. No acute intracranial findings or mass lesions.  The bony structures are intact.  No acute skull or facial bone fracture.  The paranasal sinuses are clear.  IMPRESSION:  1.  No acute cranial findings.  Stable atrophy and periventricular white matter disease. 2.  No acute  skull fracture.  CT CERVICAL SPINE  Findings: The sagittal reformatted images demonstrate normal alignment of the cervical vertebral bodies.  Disc spaces and vertebral bodies are maintained.  No acute bony findings or abnormal prevertebral soft tissue swelling.  The facets are normally aligned.  No facet or laminar fractures are seen. No large disc protrusions.  The neural foramen are patent.  The skull base C1 and C1-C2 articulations are maintained.  The dens is normal.  There are scattered cervical lymph nodes.  The lung apices are clear.  IMPRESSION: Normal alignment and no acute fracture.   Original Report Authenticated By: Rudie Meyer, M.D.   Ct Cervical Spine Wo Contrast  03/22/2013   *RADIOLOGY REPORT*  Clinical Data:  Larey Seat.  Hit head.  CT HEAD WITHOUT CONTRAST CT CERVICAL SPINE WITHOUT CONTRAST  Technique:  Multidetector CT imaging of the head and cervical spine was performed following the standard protocol without intravenous contrast.  Multiplanar CT image reconstructions of the cervical spine were also generated.  Comparison:  03/17/2013.  CT HEAD  Findings: Stable atrophy and periventricular white matter disease. No acute intracranial findings or mass lesions.  The bony  structures are intact.  No acute skull or facial bone fracture.  The paranasal sinuses are clear.  IMPRESSION:  1.  No acute cranial findings.  Stable atrophy and periventricular white matter disease. 2.  No acute skull fracture.  CT CERVICAL SPINE  Findings: The sagittal reformatted images demonstrate normal alignment of the cervical vertebral bodies.  Disc spaces and vertebral bodies are maintained.  No acute bony findings or abnormal prevertebral soft tissue swelling.  The facets are normally aligned.  No facet or laminar fractures are seen. No large disc protrusions.  The neural foramen are patent.  The skull base C1 and C1-C2 articulations are maintained.  The dens is normal.  There are scattered cervical lymph nodes.  The lung apices are clear.  IMPRESSION: Normal alignment and no acute fracture.   Original Report Authenticated By: Rudie Meyer, M.D.     Scheduled Meds: . aspirin EC  81 mg Oral QPM  . atorvastatin  40 mg Oral q morning - 10a  . busPIRone  15 mg Oral BID  . cloNIDine  0.1 mg Oral BID  . enoxaparin (LOVENOX) injection  30 mg Subcutaneous Q24H  . gabapentin  300 mg Oral TID  . insulin aspart  0-9 Units Subcutaneous TID WC  . insulin aspart  3 Units Subcutaneous TID WC  . labetalol  300 mg Oral BID  . levofloxacin  500 mg Oral Q48H  . multivitamin with minerals  1 tablet Oral Daily  . nortriptyline  20 mg Oral QHS  . pantoprazole  40 mg Oral Daily  . PARoxetine  20 mg Oral BID  . sodium bicarbonate  1,300 mg Oral Daily   Continuous Infusions: . sodium chloride      Time spent: 35 minutes.   LOS: 1 day   RAMA,CHRISTINA  [facility] Pager [phone].  If 8PM-8AM, please contact night-coverage at [email or web address], password [facility] 03/23/2013, 10:18 AM

## 2013-03-23 NOTE — Progress Notes (Signed)
INITIAL NUTRITION ASSESSMENT  Pt meets criteria for severe MALNUTRITION in the context of chronic illness as evidenced by <75% estimated energy intake in the past 2 months with 16% weight loss in the past 6 months.  DOCUMENTATION CODES Per approved criteria  -Severe malnutrition in the context of chronic illness   INTERVENTION: - Recommend palliative care consult r/t failure to thrive - Recommend MD consider appetite stimulant in an effort to improve nutritional status, if pt agreeable - Resource Breeze QID - Multivitamin 1 tablet PO daily - Will continue to monitor   NUTRITION DIAGNOSIS: Inadequate oral intake related to poor appetite as evidenced by pt report, unintended weight loss.   Goal: Pt to consume 90% of meals/supplements  Monitor:  Weights, labs, intake   Reason for Assessment: Consult   72 y.o. male  Admitting Dx: Hypocalcemia  ASSESSMENT: Pt with history of hypertension, diabetes, COPD, CAD, AAA, renal insufficiency and right lung mass who presents with symptoms of weakness and a fall. Met with pt and wife who report pt with very poor intake in the past week with pt consuming mostly mango juice, water, pepsi, and milk. When asked why pt could not eat solid foods, pt replied "I just can't, I have no appetite". Wife reported pt has lost 30 pounds unintentionally in the past month, however past records indicate pt has lost 28 pounds unintentionally in the past 6 months. Wife reports she had ordered some Resource Breeze for pt PTA however it had not arrived yet.   After visit wife met with RD privately outside of room. Wife reported being frustrated with pt's decision of not eating for the past 2 months with significant weight loss. Wife expressed concern for pt's nutritional status but was not sure what to do as she has been encouraging him to eat, which she states pt is annoyed with, and have tried various nutritional supplements. Discussed ways to improve intake such as  brining in foods pt will eat and provided pt with Resource Breeze during admission for additional nutrition as pt agreeable to trying to this supplement.    Height: Ht Readings from Last 1 Encounters:  03/23/13 5\' 9"  (1.753 m)    Weight: Wt Readings from Last 1 Encounters:  03/23/13 146 lb 9.7 oz (66.5 kg)    Ideal Body Weight: 160 lb  % Ideal Body Weight: 91  Wt Readings from Last 10 Encounters:  03/23/13 146 lb 9.7 oz (66.5 kg)  03/16/13 150 lb 1.6 oz (68.085 kg)  03/13/13 149 lb (67.586 kg)  02/27/13 161 lb (73.029 kg)  02/02/13 167 lb (75.751 kg)  10/25/12 174 lb 4 oz (79.039 kg)  10/19/12 176 lb 3.2 oz (79.924 kg)  10/07/12 178 lb 0.6 oz (80.758 kg)  09/19/12 180 lb (81.647 kg)  09/05/12 178 lb (80.74 kg)    Usual Body Weight: 170-175 lb  % Usual Body Weight: 83-86  BMI:  Body mass index is 21.64 kg/(m^2).  Estimated Nutritional Needs: Kcal: 1950-2100 Protein: 80-100g Fluid: 1.9-2.1L/day  Skin: Intact   Diet Order: General  EDUCATION NEEDS: -Education needs addressed - encouraged small frequent high calorie/protein meals to improve appetite and intake    Intake/Output Summary (Last 24 hours) at 03/23/13 1328 Last data filed at 03/23/13 0646  Gross per 24 hour  Intake      0 ml  Output    400 ml  Net   -400 ml    Last BM: 5/16  Labs:   Recent Labs Lab 03/22/13 2215  03/23/13 0400 03/23/13 0743  NA 139 141 141  K 4.2 3.6 3.7  CL 98 104 103  CO2 23 21 20   BUN 37* 33* 32*  CREATININE 2.98* 2.73* 2.74*  CALCIUM 6.6* 5.8* 5.9*  MG  --   --  0.5*  PHOS  --   --  4.6  GLUCOSE 117* 55* 52*    CBG (last 3)   Recent Labs  03/23/13 0846 03/23/13 1135 03/23/13 1213  GLUCAP 74 57* 87    Scheduled Meds: . aspirin EC  81 mg Oral QPM  . atorvastatin  40 mg Oral q morning - 10a  . busPIRone  15 mg Oral BID  . calcitRIOL  0.25 mcg Oral Daily  . calcium-vitamin D  1 tablet Oral TID  . cloNIDine  0.1 mg Oral BID  . enoxaparin (LOVENOX)  injection  30 mg Subcutaneous Q24H  . feeding supplement (NEPRO CARB STEADY)  237 mL Oral TID BM  . gabapentin  300 mg Oral TID  . insulin aspart  0-9 Units Subcutaneous TID WC  . labetalol  300 mg Oral BID  . multivitamin with minerals  1 tablet Oral Daily  . nortriptyline  20 mg Oral QHS  . pantoprazole  40 mg Oral Daily  . PARoxetine  20 mg Oral BID  . sodium bicarbonate  1,300 mg Oral Daily    Continuous Infusions: . dextrose 5 % and 0.9% NaCl      Past Medical History  Diagnosis Date  . Diabetes mellitus   . Hypertension   . Anxiety   . COPD (chronic obstructive pulmonary disease)   . Hyperlipemia   . GERD (gastroesophageal reflux disease)   . Barrett's esophagus with high grade dysplasia     s/p RFA  . PVD (peripheral vascular disease)   . CAD (coronary artery disease)   . Tobacco user   . Depression   . Hiatal hernia   . Myocardial infarction 12/1996  . AAA (abdominal aortic aneurysm)   . Renal insufficiency 2010    Past Surgical History  Procedure Laterality Date  . Tonsillectomy    . Nissen fundoplication    . Abdominal aortic aneurysm repair  2010    and right common iliac artery aneurysm, DR HAYES  . Upper gastrointestinal endoscopy  07/07/2011    barretts esophagus, hiatal hernia, gastritis, ablations in esophagus  . Colonoscopy  05/07/2005    diverticulosis, internal and external hemorrhoids  . Abdominal aortic aneurysm repair    . Radiofrequency ablation  multiple    Barrett's, high-grade dysplasia  . Colonoscopy N/A 03/01/2013    Procedure: COLONOSCOPY;  Surgeon: Iva Boop, MD;  Location: [facility];  Service: Endoscopy;  Laterality: N/A;     Levon Hedger MS, RD, LDN [phone] Pager [phone] After Hours Pager

## 2013-03-24 DIAGNOSIS — D649 Anemia, unspecified: Secondary | ICD-10-CM

## 2013-03-24 DIAGNOSIS — R222 Localized swelling, mass and lump, trunk: Secondary | ICD-10-CM

## 2013-03-24 DIAGNOSIS — R5383 Other fatigue: Secondary | ICD-10-CM

## 2013-03-24 DIAGNOSIS — E876 Hypokalemia: Secondary | ICD-10-CM

## 2013-03-24 LAB — RENAL FUNCTION PANEL
Albumin: 2.1 g/dL — ABNORMAL LOW (ref 3.5–5.2)
CO2: 23 mEq/L (ref 19–32)
Calcium: 5.8 mg/dL — CL (ref 8.4–10.5)
Creatinine, Ser: 2.43 mg/dL — ABNORMAL HIGH (ref 0.50–1.35)
GFR calc non Af Amer: 25 mL/min — ABNORMAL LOW (ref >90)
Phosphorus: 3.9 mg/dL (ref 2.3–4.6)

## 2013-03-24 LAB — GLUCOSE, CAPILLARY
Glucose-Capillary: 106 mg/dL — ABNORMAL HIGH (ref 70–99)
Glucose-Capillary: 119 mg/dL — ABNORMAL HIGH (ref 70–99)
Glucose-Capillary: 154 mg/dL — ABNORMAL HIGH (ref 70–99)

## 2013-03-24 LAB — PTH, INTACT AND CALCIUM: Calcium, Total (PTH): 9.6 mg/dL (ref 8.4–10.5)

## 2013-03-24 LAB — MAGNESIUM: Magnesium: 0.4 mg/dL — CL (ref 1.5–2.5)

## 2013-03-24 LAB — URIC ACID: Uric Acid, Serum: 8.4 mg/dL — ABNORMAL HIGH (ref 4.0–7.8)

## 2013-03-24 MED ORDER — OXYCODONE-ACETAMINOPHEN 5-325 MG PO TABS: 1.0000 | ORAL_TABLET | ORAL | Status: DC | PRN

## 2013-03-24 MED ORDER — CLONAZEPAM 0.5 MG PO TABS
0.5000 mg | ORAL_TABLET | Freq: Two times a day (BID) | ORAL | Status: DC
  Administered 2013-03-24 – 2013-03-26 (×5): 0.5 mg via ORAL
  Filled 2013-03-24 (×5): qty 1

## 2013-03-24 MED ORDER — MAGNESIUM SULFATE 40 MG/ML IJ SOLN
4.0000 g | Freq: Once | INTRAMUSCULAR | Status: AC
  Administered 2013-03-24: 4 g via INTRAVENOUS
  Filled 2013-03-24: qty 100

## 2013-03-24 MED ORDER — GABAPENTIN 300 MG PO CAPS
300.0000 mg | ORAL_CAPSULE | Freq: Every day | ORAL | Status: DC
  Administered 2013-03-25 – 2013-03-26 (×2): 300 mg via ORAL
  Filled 2013-03-24 (×3): qty 1

## 2013-03-24 MED ORDER — DULOXETINE HCL 60 MG PO CPEP
60.0000 mg | ORAL_CAPSULE | Freq: Every day | ORAL | Status: DC
  Administered 2013-03-24 – 2013-03-29 (×6): 60 mg via ORAL
  Filled 2013-03-24 (×6): qty 1

## 2013-03-24 MED ORDER — POTASSIUM CHLORIDE CRYS ER 20 MEQ PO TBCR
40.0000 meq | EXTENDED_RELEASE_TABLET | Freq: Once | ORAL | Status: AC
  Administered 2013-03-24: 40 meq via ORAL
  Filled 2013-03-24: qty 2

## 2013-03-24 MED ORDER — DRONABINOL 2.5 MG PO CAPS
2.5000 mg | ORAL_CAPSULE | Freq: Two times a day (BID) | ORAL | Status: DC
  Administered 2013-03-24 – 2013-03-26 (×6): 2.5 mg via ORAL
  Filled 2013-03-24 (×6): qty 1

## 2013-03-24 MED ORDER — ALPRAZOLAM 1 MG PO TABS
1.0000 mg | ORAL_TABLET | Freq: Once | ORAL | Status: AC
  Administered 2013-03-24: 1 mg via ORAL
  Filled 2013-03-24: qty 1

## 2013-03-24 MED ORDER — OXYCODONE-ACETAMINOPHEN 5-325 MG PO TABS
1.0000 | ORAL_TABLET | Freq: Every day | ORAL | Status: DC
  Administered 2013-03-24 – 2013-03-28 (×5): 1 via ORAL
  Filled 2013-03-24 (×5): qty 1

## 2013-03-24 MED ORDER — MAGNESIUM OXIDE 400 (241.3 MG) MG PO TABS
800.0000 mg | ORAL_TABLET | Freq: Two times a day (BID) | ORAL | Status: DC
  Administered 2013-03-24 – 2013-03-28 (×9): 800 mg via ORAL
  Filled 2013-03-24 (×10): qty 2

## 2013-03-24 MED ORDER — PREDNISONE 20 MG PO TABS
40.0000 mg | ORAL_TABLET | Freq: Every day | ORAL | Status: AC
  Administered 2013-03-24 – 2013-03-26 (×3): 40 mg via ORAL
  Filled 2013-03-24 (×4): qty 2

## 2013-03-24 NOTE — Progress Notes (Signed)
[facility] PROGRESS NOTE  [name]:[id number] DOB: 01-Aug-1941 DOA: 03/22/2013 PCP: Sonda Primes, MD  Brief narrative: [name] is an 72 y.o. male with a PMH of hypertension, diabetes, COPD, CAD, AAA, renal insufficiency and right lung mass status post CT-guided lung biopsy on 03/15/2013 with findings negative for malignancy, who was admitted on 03/23/2013 for evaluation of weakness and a fall. He was also having prominent muscle tremors, and a further work up revealed significant hypomagnesemia. Patient has had significant weight loss and underlying malignancy continues to be a concern. Accordingly, a palliative care consultation has been requested.  Assessment/Plan: Principal Problem:   Hypocalcemia, hypokalemia and hypomagnesemia with severe weakness -May be secondary to progressive kidney disease.  Placed on empiric Calcitriol and a calcium + Vitamin D supplement. -Followup vitamin D and intact PTH studies. -Magnesium critically low, repleting. -Phosphorus WNL.   -Spoke with Dr. Hyman Hopes (his nephrologist) who agrees with current work up and management. -PT/OT evaluations requested. Active Problems:   Left great toe pain -Check uric acid. Start prednisone 40 mg daily x3 days.   Severe protein calorie malnutrition -Albumin low. Significant anorexia. Dietitian consultation requested and the patient was placed on Nepro supplements. -Start Marinol for appetite stimulation.   HYPERLIPIDEMIA -Continue Lipitor. -CK WNL, no evidence of statin-induced myopathy.   ANXIETY and depression -Continue as needed alprazolam, BuSpar, Pamelor and Paxil.   TOBACCO USE DISORDER/SMOKER -Personally counseled.  Declined nicotine patch.   CORONARY ARTERY DISEASE / PERIPHERAL VASCULAR DISEASE -Continue ASA.   COPD -Respiratory status stable.   GERD / Barrett's esophagus with high grade dysplasia -Continue PPI therapy.   DM (diabetes mellitus), type 2 with peripheral vascular  complications / hypoglycemia -Continue to hold Amaryl. On a dextrose infusion with improvement in glucose.   Stage III-4 chronic kidney disease -On sodium bicarbonate.   Right Lung mass -CT guided biopsy 03/15/13 non-conclusive. -Opted to treat empirically with antibiotics then have follow up imaging done, but recently stopped empiric Levaquin secondary to problems with tremors and anorexia, thought to be related to this medication use. -Palliative care consultation requested per wife's request for goals of care.   Failure to thrive in adult -Suspect secondary to occult malignancy.   Hypertension -Continue clonidine and labetalol.  Code Status: Full. Family Communication: Wife updated at the bedside. Disposition Plan: Home versus SNF depending on progress.   Medical Consultants:  Telephone consultation with Dr. Elvis Coil, Nephrology.  Palliative care  Other Consultants:  Physical therapy  Occupational therapy  Anti-infectives:  None.  HPI/Subjective: [name] is weak with no appetite. He continues to have significant muscular tremors. He is reporting significant left toe pain to the point where he cannot bear to put a sock on his foot.  Objective: Filed Vitals:   03/23/13 0705 03/23/13 1315 03/23/13 2205 03/24/13 0446  BP: 154/74 156/72 159/70 149/87  Pulse: 83 93 86 84  Temp: 97.9 F (36.6 C) 98.2 F (36.8 C) 98.2 F (36.8 C) 99 F (37.2 C)  TempSrc: Oral Oral  Oral  Resp: 20 18 18 20   Height:      Weight:      SpO2: 96% 97% 97% 97%    Intake/Output Summary (Last 24 hours) at 03/24/13 1010 Last data filed at 03/24/13 0945  Gross per 24 hour  Intake   1723 ml  Output    865 ml  Net    858 ml    Exam: Gen:  NAD Cardiovascular:  RRR, No M/R/G Respiratory:  Lungs CTAB Gastrointestinal:  Abdomen soft, NT/ND, + BS Extremities:  No C/E/C, tremulous  Data Reviewed: Basic Metabolic Panel:  Recent Labs Lab 03/18/13 0520 03/22/13 2215  03/23/13 0400 03/23/13 0743 03/24/13 0353  NA 140 139 141 141 141  K 3.3* 4.2 3.6 3.7 3.2*  CL 98 98 104 103 104  CO2 25 23 21 20 23   GLUCOSE 134* 117* 55* 52* 120*  BUN 57* 37* 33* 32* 25*  CREATININE 2.27* 2.98* 2.73* 2.74* 2.43*  CALCIUM 7.9* 6.6* 5.8* 5.9* 5.8*  MG  --   --   --  0.5* 0.4*  PHOS  --   --   --  4.6 3.9   GFR Estimated Creatinine Clearance: 26.2 ml/min (by C-G formula based on Cr of 2.43). Liver Function Tests:  Recent Labs Lab 03/18/13 0520 03/22/13 2215 03/23/13 0743 03/24/13 0353  AST 115* 34 29  --   ALT 148* 64* 50  --   ALKPHOS 213* 166* 143*  --   BILITOT 0.2* 0.3 0.2*  --   PROT 7.3 7.3 6.4  --   ALBUMIN 2.6* 2.7* 2.3* 2.1*   CBC:  Recent Labs Lab 03/22/13 2215  WBC 19.6*  NEUTROABS 16.6*  HGB 9.5*  HCT 29.8*  MCV 92.0  PLT 324   CBG:  Recent Labs Lab 03/23/13 1135 03/23/13 1213 03/23/13 1738 03/24/13 0055 03/24/13 0730  GLUCAP 57* 87 117* 106* 119*   Microbiology Recent Results (from the past 240 hour(s))  CULTURE, BLOOD (ROUTINE X 2)     Status: None   Collection Time    03/14/13  2:55 PM      Result Value Range Status   Specimen Description BLOOD LEFT ARM   Final   Special Requests BOTTLES DRAWN AEROBIC AND ANAEROBIC LEFT HAND   Final   Culture  Setup Time 03/14/2013 22:48   Final   Culture NO GROWTH 5 DAYS   Final   Report Status 03/20/2013 FINAL   Final  CULTURE, BLOOD (ROUTINE X 2)     Status: None   Collection Time    03/14/13  3:08 PM      Result Value Range Status   Specimen Description BLOOD LEFT ARM   Final   Special Requests BOTTLES DRAWN AEROBIC AND ANAEROBIC 10CC   Final   Culture  Setup Time 03/14/2013 22:48   Final   Culture NO GROWTH 5 DAYS   Final   Report Status 03/20/2013 FINAL   Final  CLOSTRIDIUM DIFFICILE BY PCR     Status: None   Collection Time    03/15/13  4:49 PM      Result Value Range Status   C difficile by pcr NEGATIVE  NEGATIVE Final     Procedures and Diagnostic Studies: Dg  Chest 2 View  03/22/2013   *RADIOLOGY REPORT*  Clinical Data: Fall.  Weakness.  CHEST - 2 VIEW  Comparison: 03/15/2013.  Findings: The cardiac silhouette, mediastinal and hilar contours are within normal limits and stable.  Persistent but smaller cavitary lesion in the right upper lobe.  Remote healed rib fractures noted but no acute bony findings.  IMPRESSION:  1.  Persistent but smaller cavitary right upper lobe lung lesion. 2.  No new/acute pulmonary findings. 3.  Intact bony thorax.  Remote rib fractures are noted.   Original Report Authenticated By: Rudie Meyer, M.D.   Ct Head Wo Contrast  03/22/2013   *RADIOLOGY REPORT*  Clinical Data:  Larey Seat.  Hit head.  CT HEAD  WITHOUT CONTRAST CT CERVICAL SPINE WITHOUT CONTRAST  Technique:  Multidetector CT imaging of the head and cervical spine was performed following the standard protocol without intravenous contrast.  Multiplanar CT image reconstructions of the cervical spine were also generated.  Comparison:  03/17/2013.  CT HEAD  Findings: Stable atrophy and periventricular white matter disease. No acute intracranial findings or mass lesions.  The bony structures are intact.  No acute skull or facial bone fracture.  The paranasal sinuses are clear.  IMPRESSION:  1.  No acute cranial findings.  Stable atrophy and periventricular white matter disease. 2.  No acute skull fracture.  CT CERVICAL SPINE  Findings: The sagittal reformatted images demonstrate normal alignment of the cervical vertebral bodies.  Disc spaces and vertebral bodies are maintained.  No acute bony findings or abnormal prevertebral soft tissue swelling.  The facets are normally aligned.  No facet or laminar fractures are seen. No large disc protrusions.  The neural foramen are patent.  The skull base C1 and C1-C2 articulations are maintained.  The dens is normal.  There are scattered cervical lymph nodes.  The lung apices are clear.  IMPRESSION: Normal alignment and no acute fracture.   Original  Report Authenticated By: Rudie Meyer, M.D.   Ct Cervical Spine Wo Contrast  03/22/2013   *RADIOLOGY REPORT*  Clinical Data:  Larey Seat.  Hit head.  CT HEAD WITHOUT CONTRAST CT CERVICAL SPINE WITHOUT CONTRAST  Technique:  Multidetector CT imaging of the head and cervical spine was performed following the standard protocol without intravenous contrast.  Multiplanar CT image reconstructions of the cervical spine were also generated.  Comparison:  03/17/2013.  CT HEAD  Findings: Stable atrophy and periventricular white matter disease. No acute intracranial findings or mass lesions.  The bony structures are intact.  No acute skull or facial bone fracture.  The paranasal sinuses are clear.  IMPRESSION:  1.  No acute cranial findings.  Stable atrophy and periventricular white matter disease. 2.  No acute skull fracture.  CT CERVICAL SPINE  Findings: The sagittal reformatted images demonstrate normal alignment of the cervical vertebral bodies.  Disc spaces and vertebral bodies are maintained.  No acute bony findings or abnormal prevertebral soft tissue swelling.  The facets are normally aligned.  No facet or laminar fractures are seen. No large disc protrusions.  The neural foramen are patent.  The skull base C1 and C1-C2 articulations are maintained.  The dens is normal.  There are scattered cervical lymph nodes.  The lung apices are clear.  IMPRESSION: Normal alignment and no acute fracture.   Original Report Authenticated By: Rudie Meyer, M.D.     Scheduled Meds: . aspirin EC  81 mg Oral QPM  . atorvastatin  40 mg Oral q morning - 10a  . busPIRone  15 mg Oral BID  . calcitRIOL  0.25 mcg Oral Daily  . calcium-vitamin D  1 tablet Oral TID  . cloNIDine  0.1 mg Oral BID  . enoxaparin (LOVENOX) injection  30 mg Subcutaneous Q24H  . feeding supplement  1 Container Oral TID WC & HS  . gabapentin  300 mg Oral TID  . insulin aspart  0-9 Units Subcutaneous TID WC  . labetalol  300 mg Oral BID  . magnesium oxide   800 mg Oral BID  . magnesium sulfate 1 - 4 g bolus IVPB  4 g Intravenous Once  . multivitamin with minerals  1 tablet Oral Daily  . nortriptyline  20 mg Oral QHS  .  pantoprazole  40 mg Oral Daily  . PARoxetine  20 mg Oral BID  . sodium bicarbonate  1,300 mg Oral Daily   Continuous Infusions: . dextrose 5 % and 0.9% NaCl 75 mL/hr at 03/23/13 1338    Time spent: 35 minutes.   LOS: 2 days   Arisa Congleton  [facility] Pager [phone].  If 8PM-8AM, please contact night-coverage at [email or web address], password [facility] 03/24/2013, 10:10 AM

## 2013-03-24 NOTE — Progress Notes (Signed)
Pt will discharge to her sister's home [facility], [address] [place], [place] [address].Marland Kitchen380 [phone] or [phone].

## 2013-03-24 NOTE — Consult Note (Signed)
Palliative Medicine  Consult Note  Mr. Cothran is a 72 yo man with multiple chronic medical problems and newly diagnosed lung mass of undetermined etiology (cancer vs. Lung abscess). He has had profound weight loss, reduced appetite, severe fatigue, neuromuscular symptoms, and severe weakness for the past 3-4 weeks. He was admitted with severe hypomagnesemia, hypokalemia, and hypocalcemia in the setting of chronic kidney disease.  Met with patient, his wife, two daughters, and 2 other children on phone conference to discuss goals of care.   Summary of Goals:  1. DNR 2. Desires aggressive symptom management for the following:   Severe Fatigue  Nervousness, Anxiety and worsening irritability  Difficulty sleeping  Severe fatigue  Deconditioning  No appetite  Foot pain and pain from arthritis  Existential suffering and Depression  3. Would like to "get better" wants a try at rehab-has chosen [facility]. Rec Pall care follow at facility.  Pln/Recs:  1. Severe anxiety/nervousness-gave him a one time dose of alprazolam, scheduled BID Klonopin (can still have prn alprazolam for short acting if needed). Some of his irritability may be from hypocalcemia and medication side effects.  2. Discontinued Buspar-side effect is irritability and anorexia.  3. Discontinued Paxil, started Duloxitine (Cymbalta) which may be more effective for his depression, pain and appetite-direct transition, no need for wean.  4. Continue to treat Gout-could even try going up on steroids for appetite stim or switching to decadron.  5. Changed his Gabapentin to once daily dosing given renal function.  6. Patient has pain from his fall and from gout-will schedule oxycodone X1 at bedtime and otherwise prn for pain.  6. Oxygen prn for comfort  Will continue follow patient, he will probably improve significantly once his metabolic derangements and depletions are corrected. Difficult to know if this is  malignancy. He agrees to empiric antibiotics for abscess.   Full note to follow.  Anderson Malta, DO Palliative Medicine

## 2013-03-24 NOTE — Progress Notes (Signed)
CRITICAL VALUE ALERT  Critical value received:  Calcium 5.8  Date of notification:  03/24/13  Time of notification:  0520  Critical value read back:Yes  Nurse who received alert:  Renard Hamper, RN  MD notified (1st page):  Craige Cotta  Time of first page:  0530  MD notified (2nd page): N/A  Time of second page:0600  Responding MD:  Craige Cotta  Time MD responded: MD has not responded yet; patient already has calcium ordered TID.  Asked on call hospitalist if anything in addition was needed. No response received.  Will continue trying to reach.

## 2013-03-24 NOTE — Progress Notes (Signed)
Thank you for consulting the Palliative Medicine Team at [facility] to meet your patient's and family's needs.   The reason that you asked [place] to see your patient is  For Goals of Care  We have scheduled your patient for a meeting: Today Fr 5/24 at 4pm   The Surrogate decision make is: Wife Glyndon Tursi  Contact information: [phone]  Other family members that need to be present: Dtr Orville Govern [phone]

## 2013-03-24 NOTE — Progress Notes (Signed)
Physical Therapy Treatment Patient Details Name: [name] MRN: [id number] DOB: 1941-07-03 Today's Date: 03/24/2013 Time: 7829-5621 PT Time Calculation (min): 16 min  PT Assessment / Plan / Recommendation Comments on Treatment Session  Attempted ambulation this session but pt was unable. Pt became weak, sweaty, and anxious immediately after standing at EOB. Assisted pt back to sitting when pt became weaker and was unable to maintain upright posture. Pt fell back into extension onto bed. Pt was still alert and following commands but was still unable to mobilize. Called out for RN who assessed vitals which were stable. RN then requested PT hold any further therapy today due to low magnesium. Continue to recommend SNF.     Follow Up Recommendations  SNF;Supervision/Assistance - 24 hour     Does the patient have the potential to tolerate intense rehabilitation     Barriers to Discharge        Equipment Recommendations  None recommended by PT    Recommendations for Other Services OT consult  Frequency Min 3X/week   Plan Discharge plan remains appropriate    Precautions / Restrictions Precautions Precautions: Fall Precaution Comments: pt has tremors throughout body.  Restrictions Weight Bearing Restrictions: No   Pertinent Vitals/Pain R foot/great toe-unrated    Mobility  Bed Mobility Bed Mobility: Supine to Sit Supine to Sit: 4: Min guard Sit to Supine: 1: +2 Total assist Sit to Supine: Patient Percentage: 0% Details for Bed Mobility Assistance: Pt able to get himself to EOB. However, after brief standing attempt, pt abruptly sat down and feel back into extension on bed. Pt appeared tensed up and sweaty-RN notified.  Transfers Transfers: Sit to Stand;Stand to Sit Sit to Stand: 3: Mod assist Stand to Sit: 1: +2 Total assist Stand to Sit: Patient Percentage: 60% Details for Transfer Assistance: Assist to rise, stabilize at EOB with pt holding onto RW. Stood ~10 seconds  before pt sat abruptly.  Ambulation/Gait Ambulation/Gait Assistance: Not tested (comment) Ambulation/Gait Assistance Details: pt unable    Exercises     PT Diagnosis:    PT Problem List:   PT Treatment Interventions:     PT Goals Acute Rehab PT Goals Pt will go Supine/Side to Sit: with supervision PT Goal: Supine/Side to Sit - Progress: Progressing toward goal Pt will go Sit to Supine/Side: with supervision PT Goal: Sit to Supine/Side - Progress: Progressing toward goal Pt will go Sit to Stand: with supervision PT Goal: Sit to Stand - Progress: Progressing toward goal  Visit Information  Last PT Received On: 03/24/13 Assistance Needed: +2    Subjective Data  Subjective: Im shaking a little less.  Patient Stated Goal: return home   Cognition  Cognition Arousal/Alertness: Awake/alert Behavior During Therapy: WFL for tasks assessed/performed Overall Cognitive Status: Within Functional Limits for tasks assessed    Balance     End of Session PT - End of Session Activity Tolerance: Patient limited by fatigue Patient left: in bed;with call bell/phone within reach;with nursing in room   GP     Rebeca Alert, MPT Pager: [phone]

## 2013-03-24 NOTE — Progress Notes (Signed)
OT Cancellation Note  Patient Details Name: [name] MRN: [id number] DOB: [birth year]   Cancelled Treatment:    Reason Eval/Treat Not Completed: Medical issues which prohibited therapy;Other (comment) (nursing requests to try later this afternoon-getting ready to give meds and hoping pt will be less shaky later.)  Lennox Laity [phone] 03/24/2013, 11:13 AM

## 2013-03-24 NOTE — Progress Notes (Signed)
CSW met with pt at bedside to provide SNF bed offers.   CSW reviewed bed offers with pt and pt is interested in [facility].   CSW notified [facility] of pt interest and facility asked to be kept updated about when pt will be medically ready for d/c because bed offer will be dependent upon availability.   CSW noted that MD consulted palliative care for goals of care.  CSW had received voice message from pt wife who reported that she was currently working in order to maintain her job. CSW contacted pt wife and left voice message.  CSW to continue to follow to assist with disposition planning.     Jacklynn Lewis, MSW, LCSWA  Clinical Social Work [phone]

## 2013-03-25 LAB — GLUCOSE, CAPILLARY
Glucose-Capillary: 146 mg/dL — ABNORMAL HIGH (ref 70–99)
Glucose-Capillary: 210 mg/dL — ABNORMAL HIGH (ref 70–99)

## 2013-03-25 LAB — RENAL FUNCTION PANEL
Albumin: 2.1 g/dL — ABNORMAL LOW (ref 3.5–5.2)
CO2: 20 mEq/L (ref 19–32)
Calcium: 6 mg/dL — CL (ref 8.4–10.5)
Creatinine, Ser: 2.13 mg/dL — ABNORMAL HIGH (ref 0.50–1.35)
GFR calc Af Amer: 34 mL/min — ABNORMAL LOW (ref >90)
GFR calc non Af Amer: 30 mL/min — ABNORMAL LOW (ref >90)
Phosphorus: 3.4 mg/dL (ref 2.3–4.6)
Sodium: 140 mEq/L (ref 135–145)

## 2013-03-25 LAB — MAGNESIUM: Magnesium: 1.5 mg/dL (ref 1.5–2.5)

## 2013-03-25 MED ORDER — VITAMIN D3 25 MCG (1000 UNIT) PO TABS
4000.0000 [IU] | ORAL_TABLET | Freq: Every day | ORAL | Status: DC
  Administered 2013-03-25 – 2013-03-29 (×5): 4000 [IU] via ORAL
  Filled 2013-03-25 (×5): qty 4

## 2013-03-25 NOTE — Progress Notes (Signed)
CRITICAL VALUE ALERT  Critical value received: Calcium 6.0 Date of notification:  03/25/2013  Time of notification: 0545  Critical value read back:yes  Nurse who received alert:  Redmond Pulling RN  MD notified (1st page):  Benedetto Coons  Time of first page:  [phone]  MD notified (2nd page):  Time of second page:  Responding MD:  Benedetto Coons  Time MD responded:  0700  No New orders received

## 2013-03-25 NOTE — Progress Notes (Signed)
Physical Therapy Treatment Patient Details Name: [name] MRN: [id number] DOB: May 06, 1941 Today's Date: 03/25/2013 Time: 4098-1191 PT Time Calculation (min): 15 min  PT Assessment / Plan / Recommendation Comments on Treatment Session  Improvement in activity tolerance and ambulatory balance with pt limited by fatigue    Follow Up Recommendations  SNF;Supervision/Assistance - 24 hour     Does the patient have the potential to tolerate intense rehabilitation     Barriers to Discharge        Equipment Recommendations  None recommended by PT    Recommendations for Other Services OT consult  Frequency Min 3X/week   Plan Discharge plan remains appropriate    Precautions / Restrictions Precautions Precautions: Fall Precaution Comments: pt has tremors throughout body.  Restrictions Weight Bearing Restrictions: No   Pertinent Vitals/Pain Pt 6/10 back pain with OOB but relieved with return to supine    Mobility  Bed Mobility Bed Mobility: Supine to Sit;Sit to Supine Supine to Sit: 4: Min guard Sit to Supine: 4: Min guard Details for Bed Mobility Assistance: Pt able to move self to/from EOB but min guard 2* tremor  Transfers Transfers: Sit to Stand;Stand to Sit Sit to Stand: 4: Min assist Stand to Sit: 4: Min assist Details for Transfer Assistance: cues for transition position, use of UEs, basic saftey and to control descent Ambulation/Gait Ambulation/Gait Assistance: 4: Min assist Ambulation Distance (Feet): 40 Feet Assistive device: Rolling walker Ambulation/Gait Assistance Details: cues for posture, position from RW and saftey awareness.  Assist for balance and RW management Gait Pattern: Trunk flexed;Shuffle;Decreased step length - right;Decreased step length - left Gait velocity: decreased    Exercises     PT Diagnosis:    PT Problem List:   PT Treatment Interventions:     PT Goals Acute Rehab PT Goals PT Goal Formulation: With patient Time For Goal  Achievement: 04/06/13 Potential to Achieve Goals: Fair Pt will go Supine/Side to Sit: with supervision PT Goal: Supine/Side to Sit - Progress: Progressing toward goal Pt will go Sit to Supine/Side: with supervision PT Goal: Sit to Supine/Side - Progress: Progressing toward goal Pt will go Sit to Stand: with supervision PT Goal: Sit to Stand - Progress: Progressing toward goal Pt will Ambulate: 16 - 50 feet;with supervision;with rolling walker PT Goal: Ambulate - Progress: Progressing toward goal Pt will Go Up / Down Stairs: 1-2 stairs;with min assist;with rolling walker  Visit Information  Last PT Received On: 03/25/13 Assistance Needed: +2 (For saftey based on session 03/24/13) PT/OT Co-Evaluation/Treatment: Yes    Subjective Data  Subjective: I'm doing better than yesterday Patient Stated Goal: return home   Cognition  Cognition Arousal/Alertness: Awake/alert Behavior During Therapy: WFL for tasks assessed/performed Overall Cognitive Status: Within Functional Limits for tasks assessed    Balance  Static Sitting Balance Static Sitting - Balance Support: No upper extremity supported;Feet supported Static Sitting - Level of Assistance: 5: Stand by assistance Static Standing Balance Static Standing - Balance Support: Bilateral upper extremity supported Static Standing - Level of Assistance: 4: Min assist Static Standing - Comment/# of Minutes: 2  End of Session PT - End of Session Equipment Utilized During Treatment: Gait belt Activity Tolerance: Patient limited by fatigue Patient left: in bed;with call bell/phone within reach;with family/visitor present Nurse Communication: Mobility status   GP     Jaslynne Dahan 03/25/2013, 3:57 PM

## 2013-03-25 NOTE — Progress Notes (Signed)
[facility] PROGRESS NOTE  [name] BJY:[id number] DOB: 25-Feb-1941 DOA: 03/22/2013 PCP: Sonda Primes, MD  Brief narrative: [name] is an 72 y.o. male with a PMH of hypertension, diabetes, COPD, CAD, AAA, renal insufficiency and right lung mass status post CT-guided lung biopsy on 03/15/2013 with findings negative for malignancy, who was admitted on 03/23/2013 for evaluation of weakness and a fall. He was also having prominent muscle tremors, and a further work up revealed significant hypomagnesemia. Patient has had significant weight loss and underlying malignancy continues to be a concern. Accordingly, a palliative care consultation has been requested.  Assessment/Plan: Principal Problem:   Hypocalcemia, hypokalemia and hypomagnesemia with severe weakness -May be secondary to progressive kidney disease.  Placed on empiric Calcitriol and a calcium + Vitamin D supplement. -Intact PTH 63.5, within normal limits. Total calcium 9.6. -Followup vitamin D studies. -Magnesium critically low, repleting. -Phosphorus WNL.   -Status post PT/OT evaluations, we'll need skilled nursing home placement versus 24 hour supervision at home. Active Problems:   Left great toe pain secondary to gout flare -Uric acid level high at 8.4 consistent with gout. Continue prednisone 40 mg daily x3 days.   Severe protein calorie malnutrition -Albumin low. Significant anorexia. Dietitian consultation requested and the patient was placed on Nepro supplements. -Start Marinol for appetite stimulation.   HYPERLIPIDEMIA -Continue Lipitor. -CK WNL, no evidence of statin-induced myopathy.   ANXIETY and depression -Continue as needed alprazolam, Pamelor and Cymbalta. BuSpar discontinued secondary to concerns that it may be contributing to irritability and anorexia. Paxil discontinued in favor of Cymbalta.   TOBACCO USE DISORDER/SMOKER -Personally counseled.  Declined nicotine patch.   CORONARY ARTERY DISEASE  / PERIPHERAL VASCULAR DISEASE -Continue ASA.   COPD -Respiratory status stable.   GERD / Barrett's esophagus with high grade dysplasia -Continue PPI therapy.   DM (diabetes mellitus), type 2 with peripheral vascular complications / hypoglycemia -Continue to hold Amaryl. On a dextrose infusion with improvement in glucose.   Stage III-4 chronic kidney disease -On sodium bicarbonate.   Right Lung mass -CT guided biopsy 03/15/13 non-conclusive. -Opted to treat empirically with antibiotics then have follow up imaging done, but recently stopped empiric Levaquin secondary to problems with tremors and anorexia, thought to be related to this medication use. -Palliative care consultation requested per wife's request for goals of care.   Failure to thrive in adult -Suspect secondary to occult malignancy.   Hypertension -Continue clonidine and labetalol.  Code Status: Full. Family Communication: Wife updated by telephone 03/24/13.  Message left 03/25/13. Disposition Plan: SNF.   Medical Consultants:  Telephone consultation with Dr. Elvis Coil, Nephrology.  Palliative care  Other Consultants:  Physical therapy  Occupational therapy  Anti-infectives:  None.  HPI/Subjective: [name] is much better. His tremors are gone. He states that his toe pain is gone. Denies diarrhea despite multiple doses of magnesium given yesterday. He tells me his appetite is picking up a bit.  Objective: Filed Vitals:   03/24/13 0446 03/24/13 1338 03/24/13 2100 03/25/13 0500  BP: 149/87 126/66 127/64 147/70  Pulse: 84 92 85 79  Temp: 99 F (37.2 C)  97.4 F (36.3 C) 97.9 F (36.6 C)  TempSrc: Oral  Oral Oral  Resp: 20  20 16   Height:      Weight:      SpO2: 97% 98% 94% 95%    Intake/Output Summary (Last 24 hours) at 03/25/13 0865 Last data filed at 03/24/13 2200  Gross per 24 hour  Intake  900 ml  Output    401 ml  Net    499 ml    Exam: Gen:  NAD Cardiovascular:  RRR, No  M/R/G Respiratory:  Lungs CTAB Gastrointestinal:  Abdomen soft, NT/ND, + BS Extremities:  No C/E/C, tremors now gone  Data Reviewed: Basic Metabolic Panel:  Recent Labs Lab 03/22/13 2215 03/23/13 0400 03/23/13 0743 03/24/13 0353 03/24/13 1817 03/25/13 0359  NA 139 141 141 141  --  140  K 4.2 3.6 3.7 3.2*  --  3.7  CL 98 104 103 104  --  105  CO2 23 21 20 23   --  20  GLUCOSE 117* 55* 52* 120*  --  277*  BUN 37* 33* 32* 25*  --  27*  CREATININE 2.98* 2.73* 2.74* 2.43*  --  2.13*  CALCIUM 6.6* 5.8* 5.9*  9.6 5.8*  --  6.0*  MG  --   --  0.5* 0.4* 1.8 1.5  PHOS  --   --  4.6 3.9  --  3.4   GFR Estimated Creatinine Clearance: 29.9 ml/min (by C-G formula based on Cr of 2.13). Liver Function Tests:  Recent Labs Lab 03/22/13 2215 03/23/13 0743 03/24/13 0353 03/25/13 0359  AST 34 29  --   --   ALT 64* 50  --   --   ALKPHOS 166* 143*  --   --   BILITOT 0.3 0.2*  --   --   PROT 7.3 6.4  --   --   ALBUMIN 2.7* 2.3* 2.1* 2.1*   CBC:  Recent Labs Lab 03/22/13 2215  WBC 19.6*  NEUTROABS 16.6*  HGB 9.5*  HCT 29.8*  MCV 92.0  PLT 324   CBG:  Recent Labs Lab 03/24/13 0730 03/24/13 1119 03/24/13 1711 03/24/13 2113 03/25/13 0752  GLUCAP 119* 154* 148* 265* 107*   Microbiology Recent Results (from the past 240 hour(s))  CLOSTRIDIUM DIFFICILE BY PCR     Status: None   Collection Time    03/15/13  4:49 PM      Result Value Range Status   C difficile by pcr NEGATIVE  NEGATIVE Final     Procedures and Diagnostic Studies: Dg Chest 2 View  03/22/2013   *RADIOLOGY REPORT*  Clinical Data: Fall.  Weakness.  CHEST - 2 VIEW  Comparison: 03/15/2013.  Findings: The cardiac silhouette, mediastinal and hilar contours are within normal limits and stable.  Persistent but smaller cavitary lesion in the right upper lobe.  Remote healed rib fractures noted but no acute bony findings.  IMPRESSION:  1.  Persistent but smaller cavitary right upper lobe lung lesion. 2.  No  new/acute pulmonary findings. 3.  Intact bony thorax.  Remote rib fractures are noted.   Original Report Authenticated By: Rudie Meyer, M.D.   Ct Head Wo Contrast  03/22/2013   *RADIOLOGY REPORT*  Clinical Data:  Larey Seat.  Hit head.  CT HEAD WITHOUT CONTRAST CT CERVICAL SPINE WITHOUT CONTRAST  Technique:  Multidetector CT imaging of the head and cervical spine was performed following the standard protocol without intravenous contrast.  Multiplanar CT image reconstructions of the cervical spine were also generated.  Comparison:  03/17/2013.  CT HEAD  Findings: Stable atrophy and periventricular white matter disease. No acute intracranial findings or mass lesions.  The bony structures are intact.  No acute skull or facial bone fracture.  The paranasal sinuses are clear.  IMPRESSION:  1.  No acute cranial findings.  Stable atrophy and periventricular white matter disease. 2.  No acute skull fracture.  CT CERVICAL SPINE  Findings: The sagittal reformatted images demonstrate normal alignment of the cervical vertebral bodies.  Disc spaces and vertebral bodies are maintained.  No acute bony findings or abnormal prevertebral soft tissue swelling.  The facets are normally aligned.  No facet or laminar fractures are seen. No large disc protrusions.  The neural foramen are patent.  The skull base C1 and C1-C2 articulations are maintained.  The dens is normal.  There are scattered cervical lymph nodes.  The lung apices are clear.  IMPRESSION: Normal alignment and no acute fracture.   Original Report Authenticated By: Rudie Meyer, M.D.   Ct Cervical Spine Wo Contrast  03/22/2013   *RADIOLOGY REPORT*  Clinical Data:  Larey Seat.  Hit head.  CT HEAD WITHOUT CONTRAST CT CERVICAL SPINE WITHOUT CONTRAST  Technique:  Multidetector CT imaging of the head and cervical spine was performed following the standard protocol without intravenous contrast.  Multiplanar CT image reconstructions of the cervical spine were also generated.   Comparison:  03/17/2013.  CT HEAD  Findings: Stable atrophy and periventricular white matter disease. No acute intracranial findings or mass lesions.  The bony structures are intact.  No acute skull or facial bone fracture.  The paranasal sinuses are clear.  IMPRESSION:  1.  No acute cranial findings.  Stable atrophy and periventricular white matter disease. 2.  No acute skull fracture.  CT CERVICAL SPINE  Findings: The sagittal reformatted images demonstrate normal alignment of the cervical vertebral bodies.  Disc spaces and vertebral bodies are maintained.  No acute bony findings or abnormal prevertebral soft tissue swelling.  The facets are normally aligned.  No facet or laminar fractures are seen. No large disc protrusions.  The neural foramen are patent.  The skull base C1 and C1-C2 articulations are maintained.  The dens is normal.  There are scattered cervical lymph nodes.  The lung apices are clear.  IMPRESSION: Normal alignment and no acute fracture.   Original Report Authenticated By: Rudie Meyer, M.D.     Scheduled Meds: . aspirin EC  81 mg Oral QPM  . atorvastatin  40 mg Oral q morning - 10a  . calcitRIOL  0.25 mcg Oral Daily  . calcium-vitamin D  1 tablet Oral TID  . clonazePAM  0.5 mg Oral BID  . cloNIDine  0.1 mg Oral BID  . dronabinol  2.5 mg Oral BID AC  . DULoxetine  60 mg Oral Daily  . enoxaparin (LOVENOX) injection  30 mg Subcutaneous Q24H  . feeding supplement  1 Container Oral TID WC & HS  . gabapentin  300 mg Oral QHS  . insulin aspart  0-9 Units Subcutaneous TID WC  . labetalol  300 mg Oral BID  . magnesium oxide  800 mg Oral BID  . multivitamin with minerals  1 tablet Oral Daily  . nortriptyline  20 mg Oral QHS  . oxyCODONE-acetaminophen  1 tablet Oral QHS  . pantoprazole  40 mg Oral Daily  . predniSONE  40 mg Oral QAC breakfast  . sodium bicarbonate  1,300 mg Oral Daily   Continuous Infusions: . dextrose 5 % and 0.9% NaCl 75 mL/hr at 03/25/13 0814    Time  spent: 25 minutes.   LOS: 3 days   RAMA,CHRISTINA  [facility] Pager [phone].  If 8PM-8AM, please contact night-coverage at [email or web address], password [facility] 03/25/2013, 9:38 AM

## 2013-03-25 NOTE — Evaluation (Signed)
Occupational Therapy Evaluation Patient Details Name: [name] MRN: [id number] DOB: 1940-11-15 Today's Date: 03/25/2013 Time: 5366-4403 and 1437 -1442 OT Time Calculation (min): 20 min  OT Assessment / Plan / Recommendation Clinical Impression  This 72 year old man was admitted for hypocalcemia with severe weakness.  He was admitted a couple of weeks ago with lung mass and h/o falls.  Pt was able to perform adls at home but had falls.  He will benefit from acute OT to maximize safety and independence with adls/bathroom mobility    OT Assessment  Patient needs continued OT Services    Follow Up Recommendations  SNF    Barriers to Discharge      Equipment Recommendations  None recommended by OT (pt does not want commode)    Recommendations for Other Services    Frequency  Min 2X/week    Precautions / Restrictions Precautions Precautions: Fall Precaution Comments: pt has tremors throughout body.  Restrictions Weight Bearing Restrictions: No   Pertinent Vitals/Pain 6/10 back.  Repositioned and pain felt better    ADL  Grooming: Set up Where Assessed - Grooming: Supported sitting Upper Body Bathing: Set up Where Assessed - Upper Body Bathing: Unsupported sitting Lower Body Bathing: Minimal assistance Where Assessed - Lower Body Bathing: Supported sit to stand Upper Body Dressing: Minimal assistance (lines) Where Assessed - Upper Body Dressing: Unsupported sitting Lower Body Dressing: Minimal assistance Where Assessed - Lower Body Dressing: Supported sit to stand Toilet Transfer: Minimal assistance Toilet Transfer Method: Sit to Barista: Materials engineer and Hygiene: Min guard Where Assessed - Engineer, mining and Hygiene: Sit to stand from 3-in-1 or toilet Equipment Used: Rolling walker Transfers/Ambulation Related to ADLs: The first time I entered the room, pt was walking from bathroom with wife  and IV pole.  Steadied and assisted back to bed.  Used RW on second time used RW for increased safety ADL Comments: Pt needs min A to stand.  He still has shaking from decreased magnesium.  Pt's activity tolerance is decreased and he experienced back pain after walking into hall.  Pt can cross legs for adls    OT Diagnosis: Generalized weakness  OT Problem List: Decreased strength;Decreased activity tolerance;Impaired balance (sitting and/or standing);Pain OT Treatment Interventions: Self-care/ADL training;Balance training;Visual/perceptual remediation/compensation;Therapeutic activities   OT Goals Acute Rehab OT Goals OT Goal Formulation: With patient Time For Goal Achievement: 04/08/13 Potential to Achieve Goals: Good ADL Goals Pt Will Perform Grooming: with supervision;Standing at sink ADL Goal: Grooming - Progress: Goal set today Pt Will Perform Lower Body Dressing: with supervision;Sit to stand from chair ADL Goal: Lower Body Dressing - Progress: Goal set today Pt Will Transfer to Toilet: with supervision;Comfort height toilet ADL Goal: Toilet Transfer - Progress: Goal set today Pt Will Perform Toileting - Clothing Manipulation: with supervision;Sitting on 3-in-1 or toilet;Standing ADL Goal: Toileting - Clothing Manipulation - Progress: Goal set today  Visit Information  Last OT Received On: 03/25/13 Assistance Needed: +2 (For saftey based on session 03/24/13) PT/OT Co-Evaluation/Treatment:  (partially)    Subjective Data  Subjective: i got a walker the last time, but I still fell.  I hit the edge of the carpet (throw rug)  Patient Stated Goal: get strength back   Prior Functioning     Home Living Lives With: Spouse Bathroom Shower/Tub: Tub/shower unit Bathroom Toilet: Standard Home Adaptive Equipment: Walker - rolling Prior Function Comments: was in the hospital a couple of weeks ago.  Has had falls, some with walker, some without:  mostly functioning on his  own Communication Communication: No difficulties         Vision/Perception     Cognition  Cognition Arousal/Alertness: Awake/alert Behavior During Therapy: WFL for tasks assessed/performed Overall Cognitive Status: Within Functional Limits for tasks assessed Pt was walking from bathroom with wife and IV pole.  Didn't realize RW could be adjusted.  Educated to use RW and to call staff for assistance   Extremity/Trunk Assessment Right Upper Extremity Assessment RUE ROM/Strength/Tone: Within functional levels Left Upper Extremity Assessment LUE ROM/Strength/Tone: Within functional levels     Mobility Bed Mobility Bed Mobility: Supine to Sit;Sit to Supine Supine to Sit: 4: Min guard Sit to Supine: 4: Min guard Details for Bed Mobility Assistance: Pt able to move self to/from EOB but min guard 2* tremor  Transfers Sit to Stand: 4: Min assist Stand to Sit: 4: Min assist Details for Transfer Assistance: cues for transition position, use of UEs, basic saftey and to control descent     Exercise     Balance Static Sitting Balance Static Sitting - Balance Support: No upper extremity supported;Feet supported Static Sitting - Level of Assistance: 5: Stand by assistance Static Standing Balance Static Standing - Balance Support: Bilateral upper extremity supported Static Standing - Level of Assistance: 4: Min assist Static Standing - Comment/# of Minutes: 2   End of Session OT - End of Session Activity Tolerance: Patient limited by fatigue;Patient limited by pain Patient left: in bed;with call bell/phone within reach;with family/visitor present  GO     Jalynn Betzold 03/25/2013, 4:04 PM Marica Otter, OTR/L [phone] 03/25/2013

## 2013-03-26 LAB — RENAL FUNCTION PANEL
BUN: 23 mg/dL (ref 6–23)
CO2: 22 mEq/L (ref 19–32)
Calcium: 6.4 mg/dL — CL (ref 8.4–10.5)
Chloride: 110 mEq/L (ref 96–112)
Creatinine, Ser: 1.8 mg/dL — ABNORMAL HIGH (ref 0.50–1.35)
Glucose, Bld: 163 mg/dL — ABNORMAL HIGH (ref 70–99)

## 2013-03-26 LAB — GLUCOSE, CAPILLARY
Glucose-Capillary: 129 mg/dL — ABNORMAL HIGH (ref 70–99)
Glucose-Capillary: 241 mg/dL — ABNORMAL HIGH (ref 70–99)
Glucose-Capillary: 95 mg/dL (ref 70–99)

## 2013-03-26 MED ORDER — LORAZEPAM 2 MG/ML IJ SOLN
0.5000 mg | Freq: Once | INTRAMUSCULAR | Status: AC
  Administered 2013-03-26: 0.5 mg via INTRAVENOUS
  Filled 2013-03-26: qty 1

## 2013-03-26 MED ORDER — LEVOFLOXACIN IN D5W 750 MG/150ML IV SOLN: 750.0000 mg | INTRAVENOUS | Status: DC

## 2013-03-26 MED ORDER — ENOXAPARIN SODIUM 40 MG/0.4ML ~~LOC~~ SOLN
40.0000 mg | SUBCUTANEOUS | Status: DC
  Administered 2013-03-27 – 2013-03-29 (×3): 40 mg via SUBCUTANEOUS
  Filled 2013-03-26 (×3): qty 0.4

## 2013-03-26 MED ORDER — FAMOTIDINE 40 MG PO TABS
40.0000 mg | ORAL_TABLET | Freq: Two times a day (BID) | ORAL | Status: DC
  Administered 2013-03-26 – 2013-03-29 (×6): 40 mg via ORAL
  Filled 2013-03-26 (×9): qty 1

## 2013-03-26 MED ORDER — HALOPERIDOL LACTATE 5 MG/ML IJ SOLN
2.0000 mg | Freq: Four times a day (QID) | INTRAMUSCULAR | Status: DC | PRN
  Administered 2013-03-26 – 2013-03-28 (×2): 2 mg via INTRAVENOUS
  Filled 2013-03-26 (×2): qty 1

## 2013-03-26 MED ORDER — NICOTINE 21 MG/24HR TD PT24
21.0000 mg | MEDICATED_PATCH | Freq: Every day | TRANSDERMAL | Status: DC
  Filled 2013-03-26 (×2): qty 1

## 2013-03-26 MED ORDER — LEVOFLOXACIN IN D5W 750 MG/150ML IV SOLN
750.0000 mg | INTRAVENOUS | Status: DC
  Administered 2013-03-26 – 2013-03-28 (×2): 750 mg via INTRAVENOUS
  Filled 2013-03-26 (×2): qty 150

## 2013-03-26 MED ORDER — MAGNESIUM SULFATE 40 MG/ML IJ SOLN
4.0000 g | Freq: Once | INTRAMUSCULAR | Status: AC
  Administered 2013-03-26: 4 g via INTRAVENOUS
  Filled 2013-03-26: qty 100

## 2013-03-26 NOTE — Progress Notes (Signed)
ANTIBIOTIC CONSULT NOTE - INITIAL  Pharmacy Consult for Levaquin Indication: Empiric  Allergies  Allergen Reactions  . Tramadol Nausea And Vomiting  . Amlodipine Besylate     REACTION: swelling  . Benazepril Hcl     REACTION: elev K  . Penicillins     REACTION: rash, itching  . Risperidone     REACTION: loss of motor skills  . Valsartan     REACTION: elev K    Patient Measurements: Height: 5\' 9"  (175.3 cm) Weight: 146 lb 9.7 oz (66.5 kg) IBW/kg (Calculated) : 70.7  Vital Signs: Temp: 97.8 F (36.6 C) (05/25 0500) Temp src: Oral (05/25 0500) BP: 155/69 mmHg (05/25 0500) Pulse Rate: 68 (05/25 0500)  Labs:  Recent Labs  03/24/13 0353 03/25/13 0359 03/26/13 0434  CREATININE 2.43* 2.13* 1.80*   Estimated Creatinine Clearance: 35.4 ml/min (by C-G formula based on Cr of 1.8).    Microbiology: Recent Results (from the past 720 hour(s))  CULTURE, BLOOD (ROUTINE X 2)     Status: None   Collection Time    03/14/13  2:55 PM      Result Value Range Status   Specimen Description BLOOD LEFT ARM   Final   Special Requests BOTTLES DRAWN AEROBIC AND ANAEROBIC LEFT HAND   Final   Culture  Setup Time 03/14/2013 22:48   Final   Culture NO GROWTH 5 DAYS   Final   Report Status 03/20/2013 FINAL   Final  CULTURE, BLOOD (ROUTINE X 2)     Status: None   Collection Time    03/14/13  3:08 PM      Result Value Range Status   Specimen Description BLOOD LEFT ARM   Final   Special Requests BOTTLES DRAWN AEROBIC AND ANAEROBIC 10CC   Final   Culture  Setup Time 03/14/2013 22:48   Final   Culture NO GROWTH 5 DAYS   Final   Report Status 03/20/2013 FINAL   Final  CLOSTRIDIUM DIFFICILE BY PCR     Status: None   Collection Time    03/15/13  4:49 PM      Result Value Range Status   C difficile by pcr NEGATIVE  NEGATIVE Final    Medical History: Past Medical History  Diagnosis Date  . Diabetes mellitus   . Hypertension   . Anxiety   . COPD (chronic obstructive pulmonary disease)    . Hyperlipemia   . GERD (gastroesophageal reflux disease)   . Barrett's esophagus with high grade dysplasia     s/p RFA  . PVD (peripheral vascular disease)   . CAD (coronary artery disease)   . Tobacco user   . Depression   . Hiatal hernia   . Myocardial infarction 12/1996  . AAA (abdominal aortic aneurysm)   . Renal insufficiency 2010    Medications:  Anti-infectives   Start     Dose/Rate Route Frequency Ordered Stop   03/26/13 0930  levofloxacin (LEVAQUIN) IVPB 750 mg  Status:  Discontinued     750 mg 100 mL/hr over 90 Minutes Intravenous Every 24 hours 03/26/13 0923 03/26/13 0924   03/23/13 1000  levofloxacin (LEVAQUIN) tablet 500 mg  Status:  Discontinued     500 mg Oral Every 48 hours 03/23/13 0155 03/23/13 1100     Assessment: 72 yo M with hx of CKD III-IV admitted 03/23/13 for evaluation of weakness and fall. Was previously on empiric Levaquin 500mg  po Q48h as outpatinet for right lung mass (CT guided biopsy on 03/15/13 non-conclusive)  r/o lung infection, but was stopped on 03/21/13 due to problems with tremors/anorexia. This is now thought to be due to hypomagnesemia not Levaquin. Plan is to resume Levaquin. Given recent hospitalization and several say interruption in therapy, will use HAP dosing. Wt=66.5kg, CrCl~78ml/min.   Plan:  1) Levaquin 750mg  IV q48h  Darrol Angel, PharmD Pager: [phone] 03/26/2013,9:39 AM

## 2013-03-26 NOTE — Consult Note (Signed)
[name] 03/26/2013 [name] D Requesting Physician:  Dr Darnelle Catalan  Reason for Consult:  Hypomagnesemia and hypocalcemia HPI: The patient is a 72 y.o. year-old with hx of DM2, HTN, COPD, GERD with Barrett's esophagus, CAD, smoker, MI '98, AAA and CKD stage IV (creat 2.2-2.8) followed by Dr Hyman Hopes at [facility]. Patient was admitted 5/13-5/17 for lung mass workup which was negative. Returned on 5/22 with weakness and a fall. He had tremors, poor appetite and hypocalcemia w serum Ca 5.8 and corr Ca 6.8.  He rec'd IV calcium and serum Mg was checked; Mg level was 0.4 mg/dL (1.6-1.0 mg/dL).  He rec'd IV Mg and Mg came up to 1.5 then dropped again to 1.2.  Ca++ levels improved to 6.4, corr Ca= 7.5.    Patient feels better, still a little weak.  Has been taking PPI Rx for about 30 yrs.  Takes vit D, he is pretty sure.  Has CKD/     ROS  no ha, visual chg  no fever,   no cp or sob   no mass or hx cancer  recent lung mass w/u was negative     Past Medical History:  Past Medical History  Diagnosis Date  . Diabetes mellitus   . Hypertension   . Anxiety   . COPD (chronic obstructive pulmonary disease)   . Hyperlipemia   . GERD (gastroesophageal reflux disease)   . Barrett's esophagus with high grade dysplasia     s/p RFA  . PVD (peripheral vascular disease)   . CAD (coronary artery disease)   . Tobacco user   . Depression   . Hiatal hernia   . Myocardial infarction 12/1996  . AAA (abdominal aortic aneurysm)   . Renal insufficiency 2010    Past Surgical History:  Past Surgical History  Procedure Laterality Date  . Tonsillectomy    . Nissen fundoplication    . Abdominal aortic aneurysm repair  2010    and right common iliac artery aneurysm, DR HAYES  . Upper gastrointestinal endoscopy  07/07/2011    barretts esophagus, hiatal hernia, gastritis, ablations in esophagus  . Colonoscopy  05/07/2005    diverticulosis, internal and external hemorrhoids  . Abdominal aortic aneurysm repair     . Radiofrequency ablation  multiple    Barrett's, high-grade dysplasia  . Colonoscopy N/A 03/01/2013    Procedure: COLONOSCOPY;  Surgeon: Iva Boop, MD;  Location: [facility];  Service: Endoscopy;  Laterality: N/A;    Family History:  Family History  Problem Relation Age of Onset  . Coronary artery disease Other   . Diabetes Other   . Asthma Other   . Mental illness Mother     dementia  . Cancer Mother   . Diabetes Mother   . Heart disease Father   . Diabetes Father   . Stomach cancer Father   . Cancer Father   . Colon cancer Neg Hx   . Esophageal cancer Neg Hx    Social History:  reports that he has been smoking Cigarettes.  He has a 54 pack-year smoking history. He has never used smokeless tobacco. He reports that he does not drink alcohol or use illicit drugs.  Allergies:  Allergies  Allergen Reactions  . Tramadol Nausea And Vomiting  . Amlodipine Besylate     REACTION: swelling  . Benazepril Hcl     REACTION: elev K  . Penicillins     REACTION: rash, itching  . Risperidone     REACTION: loss of  motor skills  . Valsartan     REACTION: elev K    Home medications: Prior to Admission medications   Medication Sig Start Date End Date Taking? Authorizing Provider  ALPRAZolam Prudy Feeler) 0.5 MG tablet Take 1 tablet (0.5 mg total) by mouth 2 (two) times daily as needed for sleep or anxiety. 03/18/13  Yes Elease Etienne, MD  aspirin EC 81 MG tablet Take 81 mg by mouth every evening.   Yes Historical Provider, MD  atorvastatin (LIPITOR) 40 MG tablet Take 40 mg by mouth every morning.   Yes Historical Provider, MD  Cholecalciferol 1000 UNITS tablet Take 1,000 Units by mouth daily.     Yes Historical Provider, MD  cloNIDine (CATAPRES) 0.1 MG tablet Take 0.1 mg by mouth 2 (two) times daily.   Yes Historical Provider, MD  gabapentin (NEURONTIN) 300 MG capsule Take 300 mg by mouth 3 (three) times daily.   Yes Historical Provider, MD  glimepiride (AMARYL) 1 MG tablet Take 1  mg by mouth daily before breakfast.   Yes Historical Provider, MD  labetalol (NORMODYNE) 300 MG tablet Take 300 mg by mouth 2 (two) times daily. 09/12/12  Yes Tresa Garter, MD  Multiple Vitamin (MULTIVITAMIN WITH MINERALS) TABS Take 1 tablet by mouth daily. 03/18/13  Yes Elease Etienne, MD  nortriptyline (PAMELOR) 10 MG capsule Take 20 mg by mouth at bedtime.   Yes Historical Provider, MD  omeprazole (PRILOSEC) 40 MG capsule Take 80 mg by mouth daily.   Yes Historical Provider, MD  PARoxetine (PAXIL) 20 MG tablet Take 20 mg by mouth 2 (two) times daily.    Yes Historical Provider, MD  promethazine (PHENERGAN) 12.5 MG tablet Take 12.5-25 mg by mouth every 8 (eight) hours as needed for nausea.   Yes Historical Provider, MD  sodium bicarbonate 650 MG tablet Take 1,300 mg by mouth daily.     Yes Historical Provider, MD  temazepam (RESTORIL) 15 MG capsule Take 15-30 mg by mouth at bedtime as needed for sleep.   Yes Historical Provider, MD  diphenoxylate-atropine (LOMOTIL) 2.5-0.025 MG per tablet Take 1 tablet by mouth 4 (four) times daily as needed for diarrhea or loose stools. 02/02/13   Georgina Quint Plotnikov, MD  levofloxacin (LEVAQUIN) 500 MG tablet Take 1 tablet (500 mg total) by mouth every other day. Start 03/19/2013. 03/21/13   Tresa Garter, MD    Labs: Basic Metabolic Panel:  Recent Labs Lab 03/23/13 [id number] 03/24/13 0353 03/25/13 0359 03/26/13 0434  NA 141 141 140 143  K 3.7 3.2* 3.7 3.9  CL 103 104 105 110  CO2 20 23 20 22   GLUCOSE 52* 120* 277* 163*  BUN 32* 25* 27* 23  CREATININE 2.74* 2.43* 2.13* 1.80*  CALCIUM 5.9*  9.6 5.8* 6.0* 6.4*  PHOS 4.6 3.9 3.4 2.6   Liver Function Tests:  Recent Labs Lab 03/22/13 2215 03/23/13 0743 03/24/13 0353 03/25/13 0359 03/26/13 0434  AST 34 29  --   --   --   ALT 64* 50  --   --   --   ALKPHOS 166* 143*  --   --   --   BILITOT 0.3 0.2*  --   --   --   PROT 7.3 6.4  --   --   --   ALBUMIN 2.7* 2.3* 2.1* 2.1* 2.0*   No  results found for this basename: LIPASE, AMYLASE,  in the last 168 hours CBC  Recent Labs Lab 03/22/13 2215  WBC  19.6*  NEUTROABS 16.6*  HGB 9.5*  HCT 29.8*  MCV 92.0  PLT 324   PT/INR: @LABRCNTIP (inr:5) Cardiac Enzymes: )No results found for this basename: TROPONINI,  in the last 168 hours CBG:  Recent Labs Lab 03/25/13 1716 03/25/13 2133 03/26/13 0735 03/26/13 1144 03/26/13 1733  GLUCAP 220* 210* 129* 241* 160*     Physical Exam:  Blood pressure 143/70, pulse 80, temperature 98.2 F (36.8 C), temperature source Oral, resp. rate 16, height 5\' 9"  (1.753 m), weight 66.5 kg (146 lb 9.7 oz), SpO2 97.00%. Gen: alert, elderly male, no distress, thin Skin: no rash, cyanosis HEENT:  EOMI, sclera anicteric, throat clear and moist Neck: no JVD, no LAN Chest: clear bilat , no rales CV: regular, no rub or gallop, no carotid or femoral bruits, pedal pulses intact Abdomen: soft, nontender, no ascites or mass or HSM Ext: no LE edema , no joint effusion or deformity, no gangrene or ulceration Neuro: alert, Ox3, no focal deficit   Impression/Plan 1. Hypomagnesemia- severe, responded to IV Mg but levels dropped again. May have large tissue deficit. Chronic PPI Rx is one of the more common causes of hypomagnesemia now.  Not sure of the mechanism.  Would stop PPI Rx, oral absorption of Mg is said to improve within days of stopping PPI Rx.  Getting 4gm IV Mg again today, cont to replete.  24 urine for Ca and Mg.  2. Hypocalcemia, presumable due to hypomagnesemia. PTH is normal (68). Check also 25 and 1.25 vit D 3. Hx GERD / Barrett's 4. Hx MI / CAD 5. HTN 6. DM2 7. COPD 8. Smoker   Vinson Moselle  MD [facility] [phone] pgr     [phone] cell 03/26/2013, 7:02 PM

## 2013-03-26 NOTE — Progress Notes (Signed)
Palliative Medicine Team Progress Note  Received call from Mr. [name] wife. She reports that he had a very good day yesterday, but today he has been more irritable and anxious about the medical details of his hospitalization, prognosis and has paranoia about providers trying to make him die. He continues to have anger outbursts and has been extremely labile with his moods and interaction. Staff have noted that he has been almost verbally abusive to his family who are at bedside, especially his wife. He tolerated to scheduled benzos well.    His irritability is probably multifactorial-heavy smoker withdrawing from cigarettes, steroids, severe electrolyte abnormalities, grief/coping and overall loss of control.   Will give him 1 time dose of Ativan 0.5 mg IV with prn repeat dose  Nicotine patch  Haldol if agitation persists.   I reassured his wife that I would see him in the AM to address his issues and talk about helping him feel better  Johnny Malta, DO Palliative Medicine

## 2013-03-26 NOTE — Progress Notes (Signed)
Critical result called from lab Calcium of 6.4.  Lab taken and reported to patient nurse Oneita Jolly RN

## 2013-03-26 NOTE — Progress Notes (Addendum)
CRITICAL VALUE ALERT  Critical value received:  Ca 6.4  Date of notification:  03/26/13  Time of notification:  0620  Critical value read back:yes  Nurse who received alert:  Alvis Lemmings, RN  MD notified (1st page):  Lenny Pastel, NP  Time of first page:  0630  MD notified (2nd page):  Time of second page:  Responding MD: Lenny Pastel, NP (no new orders at this time as Ca is trending up)   Time MD responded:  [phone]

## 2013-03-26 NOTE — Progress Notes (Signed)
[facility] PROGRESS NOTE  [name]:[id number] DOB: 28-Aug-1941 DOA: 03/22/2013 PCP: Sonda Primes, MD  Brief narrative: [name] is an 72 y.o. male with a PMH of hypertension, diabetes, COPD, CAD, AAA, renal insufficiency and right lung mass status post CT-guided lung biopsy on 03/15/2013 with findings negative for malignancy, who was admitted on 03/23/2013 for evaluation of weakness and a fall. He was also having prominent muscle tremors, and a further work up revealed significant hypomagnesemia. Patient has had significant weight loss and underlying malignancy continues to be a concern. Accordingly, a palliative care consultation has been requested.  The patient continues to require IV magnesium repletion.  Assessment/Plan: Principal Problem:   Hypocalcemia, hypokalemia and hypomagnesemia with severe weakness -May be secondary to progressive kidney disease.  Placed on empiric Calcitriol and a calcium + Vitamin D supplement. -Intact PTH 63.5, within normal limits. Total calcium 9.6. -Followup vitamin D studies, which are still pending. -Magnesium continues to drop, repleting. Spoke with Dr. Arlean Hopping of nephrology who will see the patient in consultation. Stop PPI therapy in case it is contributing to lack of absorption of magnesium. -Phosphorus WNL.   -Status post PT/OT evaluations, may need skilled nursing home placement versus 24 hour supervision at home, but improvement with magnesium replacement noted. Active Problems:   Left great toe pain secondary to gout flare -Uric acid level high at 8.4 consistent with gout. Continue prednisone 40 mg daily x3 days.   Severe protein calorie malnutrition -Albumin low. Significant anorexia. Dietitian consultation requested and the patient was placed on Nepro supplements. -Continue Marinol for appetite stimulation.   HYPERLIPIDEMIA -Continue Lipitor. -CK WNL, no evidence of statin-induced myopathy.   ANXIETY and  depression -Continue as needed alprazolam, Pamelor and Cymbalta. BuSpar discontinued secondary to concerns that it may be contributing to irritability and anorexia. Paxil discontinued in favor of Cymbalta.   TOBACCO USE DISORDER/SMOKER -Personally counseled.  Declined nicotine patch.   CORONARY ARTERY DISEASE / PERIPHERAL VASCULAR DISEASE -Continue ASA.   COPD -Respiratory status stable.   GERD / Barrett's esophagus with high grade dysplasia -Change PPI therapy to Pepcid in case it is contributing to lack of magnesium absorption and significant hypomagnesemia.   DM (diabetes mellitus), type 2 with peripheral vascular complications / hypoglycemia -Continue to hold Amaryl. Discontinue dextrose infusion as the patient's sugars are now high, likely reflective of steroids.   Stage III-4 chronic kidney disease -On sodium bicarbonate.   Right Lung mass -CT guided biopsy 03/15/13 non-conclusive. -Opted to treat empirically with antibiotics then have follow up imaging done, but recently stopped empiric Levaquin secondary to problems with tremors and anorexia, thought to be related to this medication use (although this is more likely from hypomagnesemia---will resume). -Palliative care meeting held 03/24/13.  Recommendations noted.   Failure to thrive in adult -Suspect secondary to occult malignancy and electrolyte derangements.   Hypertension -Continue clonidine and labetalol.  Code Status: Full. Family Communication: Wife updated at bedside. Disposition Plan: Home with help versus SNF depending on progress.   Medical Consultants:  Telephone consultation with Dr. Elvis Coil and formal consultation with Dr. Delano Metz, Nephrology.  Dr. Julaine Fusi, Palliative care  Other Consultants:  Physical therapy  Occupational therapy  Anti-infectives:  None.  HPI/Subjective: [name] is tremulous again today. He is very irritable and is demanding to go home. He stated "You all are  ready to put me in the ground ". Toe pain has improved.  Objective: Filed Vitals:   03/25/13 0500 03/25/13  1330 03/25/13 2100 03/26/13 0500  BP: 147/70 138/68 153/67 155/69  Pulse: 79 80 81 68  Temp: 97.9 F (36.6 C) 97.7 F (36.5 C) 98.1 F (36.7 C) 97.8 F (36.6 C)  TempSrc: Oral Oral Oral Oral  Resp: 16 20 18 14   Height:      Weight:      SpO2: 95% 97% 98% 95%    Intake/Output Summary (Last 24 hours) at 03/26/13 0726 Last data filed at 03/26/13 0700  Gross per 24 hour  Intake   1080 ml  Output   1875 ml  Net   -795 ml    Exam: Gen:  NAD Cardiovascular:  RRR, No M/R/G Respiratory:  Lungs CTAB Gastrointestinal:  Abdomen soft, NT/ND, + BS Extremities:  No C/E/C, mild tremors again noted  Data Reviewed: Basic Metabolic Panel:  Recent Labs Lab 03/23/13 0400 03/23/13 0743 03/24/13 0353 03/24/13 1817 03/25/13 0359 03/26/13 0434  NA 141 141 141  --  140 143  K 3.6 3.7 3.2*  --  3.7 3.9  CL 104 103 104  --  105 110  CO2 21 20 23   --  20 22  GLUCOSE 55* 52* 120*  --  277* 163*  BUN 33* 32* 25*  --  27* 23  CREATININE 2.73* 2.74* 2.43*  --  2.13* 1.80*  CALCIUM 5.8* 5.9*  9.6 5.8*  --  6.0* 6.4*  MG  --  0.5* 0.4* 1.8 1.5 1.2*  PHOS  --  4.6 3.9  --  3.4 2.6   GFR Estimated Creatinine Clearance: 35.4 ml/min (by C-G formula based on Cr of 1.8). Liver Function Tests:  Recent Labs Lab 03/22/13 2215 03/23/13 0743 03/24/13 0353 03/25/13 0359 03/26/13 0434  AST 34 29  --   --   --   ALT 64* 50  --   --   --   ALKPHOS 166* 143*  --   --   --   BILITOT 0.3 0.2*  --   --   --   PROT 7.3 6.4  --   --   --   ALBUMIN 2.7* 2.3* 2.1* 2.1* 2.0*   CBC:  Recent Labs Lab 03/22/13 2215  WBC 19.6*  NEUTROABS 16.6*  HGB 9.5*  HCT 29.8*  MCV 92.0  PLT 324   CBG:  Recent Labs Lab 03/24/13 2113 03/25/13 0752 03/25/13 1206 03/25/13 1716 03/25/13 2133  GLUCAP 265* 107* 146* 220* 210*   Microbiology No results found for this or any previous visit (from  the past 240 hour(s)).   Procedures and Diagnostic Studies: Dg Chest 2 View  03/22/2013   *RADIOLOGY REPORT*  Clinical Data: Fall.  Weakness.  CHEST - 2 VIEW  Comparison: 03/15/2013.  Findings: The cardiac silhouette, mediastinal and hilar contours are within normal limits and stable.  Persistent but smaller cavitary lesion in the right upper lobe.  Remote healed rib fractures noted but no acute bony findings.  IMPRESSION:  1.  Persistent but smaller cavitary right upper lobe lung lesion. 2.  No new/acute pulmonary findings. 3.  Intact bony thorax.  Remote rib fractures are noted.   Original Report Authenticated By: Rudie Meyer, M.D.   Ct Head Wo Contrast  03/22/2013   *RADIOLOGY REPORT*  Clinical Data:  Larey Seat.  Hit head.  CT HEAD WITHOUT CONTRAST CT CERVICAL SPINE WITHOUT CONTRAST  Technique:  Multidetector CT imaging of the head and cervical spine was performed following the standard protocol without intravenous contrast.  Multiplanar CT image reconstructions of  the cervical spine were also generated.  Comparison:  03/17/2013.  CT HEAD  Findings: Stable atrophy and periventricular white matter disease. No acute intracranial findings or mass lesions.  The bony structures are intact.  No acute skull or facial bone fracture.  The paranasal sinuses are clear.  IMPRESSION:  1.  No acute cranial findings.  Stable atrophy and periventricular white matter disease. 2.  No acute skull fracture.  CT CERVICAL SPINE  Findings: The sagittal reformatted images demonstrate normal alignment of the cervical vertebral bodies.  Disc spaces and vertebral bodies are maintained.  No acute bony findings or abnormal prevertebral soft tissue swelling.  The facets are normally aligned.  No facet or laminar fractures are seen. No large disc protrusions.  The neural foramen are patent.  The skull base C1 and C1-C2 articulations are maintained.  The dens is normal.  There are scattered cervical lymph nodes.  The lung apices are clear.   IMPRESSION: Normal alignment and no acute fracture.   Original Report Authenticated By: Rudie Meyer, M.D.   Ct Cervical Spine Wo Contrast  03/22/2013   *RADIOLOGY REPORT*  Clinical Data:  Larey Seat.  Hit head.  CT HEAD WITHOUT CONTRAST CT CERVICAL SPINE WITHOUT CONTRAST  Technique:  Multidetector CT imaging of the head and cervical spine was performed following the standard protocol without intravenous contrast.  Multiplanar CT image reconstructions of the cervical spine were also generated.  Comparison:  03/17/2013.  CT HEAD  Findings: Stable atrophy and periventricular white matter disease. No acute intracranial findings or mass lesions.  The bony structures are intact.  No acute skull or facial bone fracture.  The paranasal sinuses are clear.  IMPRESSION:  1.  No acute cranial findings.  Stable atrophy and periventricular white matter disease. 2.  No acute skull fracture.  CT CERVICAL SPINE  Findings: The sagittal reformatted images demonstrate normal alignment of the cervical vertebral bodies.  Disc spaces and vertebral bodies are maintained.  No acute bony findings or abnormal prevertebral soft tissue swelling.  The facets are normally aligned.  No facet or laminar fractures are seen. No large disc protrusions.  The neural foramen are patent.  The skull base C1 and C1-C2 articulations are maintained.  The dens is normal.  There are scattered cervical lymph nodes.  The lung apices are clear.  IMPRESSION: Normal alignment and no acute fracture.   Original Report Authenticated By: Rudie Meyer, M.D.     Scheduled Meds: . aspirin EC  81 mg Oral QPM  . atorvastatin  40 mg Oral q morning - 10a  . calcitRIOL  0.25 mcg Oral Daily  . calcium-vitamin D  1 tablet Oral TID  . cholecalciferol  4,000 Units Oral Daily  . clonazePAM  0.5 mg Oral BID  . cloNIDine  0.1 mg Oral BID  . dronabinol  2.5 mg Oral BID AC  . DULoxetine  60 mg Oral Daily  . enoxaparin (LOVENOX) injection  30 mg Subcutaneous Q24H  . feeding  supplement  1 Container Oral TID WC & HS  . gabapentin  300 mg Oral QHS  . insulin aspart  0-9 Units Subcutaneous TID WC  . labetalol  300 mg Oral BID  . magnesium oxide  800 mg Oral BID  . magnesium sulfate 1 - 4 g bolus IVPB  4 g Intravenous Once  . multivitamin with minerals  1 tablet Oral Daily  . nortriptyline  20 mg Oral QHS  . oxyCODONE-acetaminophen  1 tablet Oral QHS  . pantoprazole  40 mg Oral Daily  . predniSONE  40 mg Oral QAC breakfast  . sodium bicarbonate  1,300 mg Oral Daily   Continuous Infusions: . dextrose 5 % and 0.9% NaCl 75 mL/hr at 03/25/13 2117    Time spent: 25 minutes.   LOS: 4 days   RAMA,CHRISTINA  [facility] Pager [phone].  If 8PM-8AM, please contact night-coverage at [email or web address], password [facility] 03/26/2013, 7:26 AM

## 2013-03-27 DIAGNOSIS — R222 Localized swelling, mass and lump, trunk: Secondary | ICD-10-CM

## 2013-03-27 DIAGNOSIS — D649 Anemia, unspecified: Secondary | ICD-10-CM | POA: Diagnosis present

## 2013-03-27 DIAGNOSIS — E876 Hypokalemia: Secondary | ICD-10-CM

## 2013-03-27 LAB — CBC
HCT: 25.2 % — ABNORMAL LOW (ref 39.0–52.0)
MCHC: 30.2 g/dL (ref 30.0–36.0)
MCV: 92.6 fL (ref 78.0–100.0)
Platelets: 281 10*3/uL (ref 150–400)
RDW: 13.4 % (ref 11.5–15.5)
WBC: 15.2 10*3/uL — ABNORMAL HIGH (ref 4.0–10.5)

## 2013-03-27 LAB — RENAL FUNCTION PANEL
Albumin: 2.1 g/dL — ABNORMAL LOW (ref 3.5–5.2)
Calcium: 7.3 mg/dL — ABNORMAL LOW (ref 8.4–10.5)
GFR calc Af Amer: 48 mL/min — ABNORMAL LOW (ref >90)
Phosphorus: 2.4 mg/dL (ref 2.3–4.6)
Potassium: 3.7 mEq/L (ref 3.5–5.1)
Sodium: 140 mEq/L (ref 135–145)

## 2013-03-27 LAB — GLUCOSE, CAPILLARY: Glucose-Capillary: 182 mg/dL — ABNORMAL HIGH (ref 70–99)

## 2013-03-27 MED ORDER — ALPRAZOLAM 1 MG PO TABS
1.0000 mg | ORAL_TABLET | Freq: Three times a day (TID) | ORAL | Status: DC
  Administered 2013-03-27 – 2013-03-28 (×6): 1 mg via ORAL
  Administered 2013-03-29: 0.5 mg via ORAL
  Filled 2013-03-27 (×5): qty 1

## 2013-03-27 MED ORDER — ALPRAZOLAM 0.5 MG PO TABS: 0.5000 mg | ORAL_TABLET | Freq: Three times a day (TID) | ORAL | Status: DC

## 2013-03-27 MED ORDER — DRONABINOL 5 MG PO CAPS
5.0000 mg | ORAL_CAPSULE | Freq: Two times a day (BID) | ORAL | Status: DC
Start: 2013-03-27 — End: 2013-03-29
  Administered 2013-03-27 – 2013-03-29 (×5): 5 mg via ORAL
  Filled 2013-03-27 (×5): qty 1

## 2013-03-27 MED ORDER — RISPERIDONE 0.5 MG PO TABS
0.5000 mg | ORAL_TABLET | Freq: Two times a day (BID) | ORAL | Status: DC
  Administered 2013-03-27 – 2013-03-29 (×5): 0.5 mg via ORAL
  Filled 2013-03-27 (×6): qty 1

## 2013-03-27 MED ORDER — GABAPENTIN 300 MG PO CAPS
300.0000 mg | ORAL_CAPSULE | Freq: Two times a day (BID) | ORAL | Status: DC
  Administered 2013-03-27 – 2013-03-29 (×5): 300 mg via ORAL
  Filled 2013-03-27 (×6): qty 1

## 2013-03-27 NOTE — Progress Notes (Signed)
Subjective:  Strength improving/myoclonus better Not irritable today PT has worked with pt Appears plan is for SNF (says "my wife is making those arrangements") Asking about Dr. Hyman Hopes  Objective Vital signs in last 24 hours: Filed Vitals:   03/26/13 2100 03/27/13 0500 03/27/13 0914 03/27/13 0915  BP: 188/74 173/72 173/72 173/72  Pulse: 78 74 74   Temp: 98 F (36.7 C) 97.6 F (36.4 C)    TempSrc: Oral Oral    Resp: 20 18    Height:      Weight:      SpO2: 97% 100%     Weight change:   Intake/Output Summary (Last 24 hours) at 03/27/13 1332 Last data filed at 03/27/13 1130  Gross per 24 hour  Intake    944 ml  Output   1220 ml  Net   -276 ml   Physical Exam:  Blood pressure 173/72, pulse 74, temperature 97.6 F (36.4 C), temperature source Oral, resp. rate 18, height 5\' 9"  (1.753 m), weight 66.5 kg (146 lb 9.7 oz), SpO2 100.00%. Balding WM  NAD Lungs clear No JVD Regular S1S3 No s3 Abdomen soft NT No edema Alert, oriented, good mood, joking around No obvious deficits  Labs: Basic Metabolic Panel:  Recent Labs Lab 03/22/13 2215 03/23/13 0400 03/23/13 0743 03/24/13 0353 03/25/13 0359 03/26/13 0434 03/27/13 0404  NA 139 141 141 141 140 143 140  K 4.2 3.6 3.7 3.2* 3.7 3.9 3.7  CL 98 104 103 104 105 110 105  CO2 23 21 20 23 20 22 24   GLUCOSE 117* 55* 52* 120* 277* 163* 122*  BUN 37* 33* 32* 25* 27* 23 25*  CREATININE 2.98* 2.73* 2.74* 2.43* 2.13* 1.80* 1.61*  CALCIUM 6.6* 5.8* 5.9*  9.6 5.8* 6.0* 6.4* 7.3*  PHOS  --   --  4.6 3.9 3.4 2.6 2.4   Recent Labs Lab 03/22/13 2215 03/23/13 0743  03/25/13 0359 03/26/13 0434 03/27/13 0404  AST 34 29  --   --   --   --   ALT 64* 50  --   --   --   --   ALKPHOS 166* 143*  --   --   --   --   BILITOT 0.3 0.2*  --   --   --   --   PROT 7.3 6.4  --   --   --   --   ALBUMIN 2.7* 2.3*  < > 2.1* 2.0* 2.1*  < > = values in this interval not displayed. No results found for this basename: LIPASE, AMYLASE,  in the last  168 hours No results found for this basename: AMMONIA,  in the last 168 hours CBC:  Recent Labs Lab 03/22/13 2215 03/27/13 0404  WBC 19.6* 15.2*  NEUTROABS 16.6*  --   HGB 9.5* 7.6*  HCT 29.8* 25.2*  MCV 92.0 92.6  PLT 324 281   Results for ARJUN, HARD (MRN [id number]) as of 03/27/2013 13:27  Ref. Range 03/24/2013 03:53 03/24/2013 18:17 03/25/2013 03:59 03/26/2013 04:34 03/27/2013 04:04  Magnesium 1.5-2.5 mg/dL 0.4 (LL) 1.8 1.5 1.2 (L) 1.8    Recent Labs Lab 03/23/13 0743  CKTOTAL 146    Recent Labs Lab 03/26/13 1144 03/26/13 1733 03/26/13 2123 03/27/13 0839 03/27/13 1225  GLUCAP 241* 160* 95 90 182*   Vit D levels pending Results for SUNDANCE, MOISE (MRN [id number]) as of 03/27/2013 14:22  Ref. Range 03/23/2013 07:43  PTH 14.0-72.0 pg/mL 63.5   Studies/Results: No results  found. Medications:   . ALPRAZolam  1 mg Oral TID  . aspirin EC  81 mg Oral QPM  . atorvastatin  40 mg Oral q morning - 10a  . calcitRIOL  0.25 mcg Oral Daily  . calcium-vitamin D  1 tablet Oral TID  . cholecalciferol  4,000 Units Oral Daily  . cloNIDine  0.1 mg Oral BID  . dronabinol  5 mg Oral BID AC  . DULoxetine  60 mg Oral Daily  . enoxaparin (LOVENOX) injection  40 mg Subcutaneous Q24H  . famotidine  40 mg Oral BID  . feeding supplement  1 Container Oral TID WC & HS  . gabapentin  300 mg Oral BID  . insulin aspart  0-9 Units Subcutaneous TID WC  . labetalol  300 mg Oral BID  . levofloxacin (LEVAQUIN) IV  750 mg Intravenous Q48H  . magnesium oxide  800 mg Oral BID  . multivitamin with minerals  1 tablet Oral Daily  . nortriptyline  20 mg Oral QHS  . oxyCODONE-acetaminophen  1 tablet Oral QHS  . risperiDONE  0.5 mg Oral BID  . sodium bicarbonate  1,300 mg Oral Daily    I  have reviewed scheduled and prn medications.  ASSESSMENT/RECOMMENDATIONS:  Hypomagnesemia  Severe but improving S/p IV Rx for total 8 grams and on oral supplements Most likely related to long years of PPI use  which can cause malabsorption of Mg  Follow daily until stable Should remain off PPI if possible (but if not should remain on supplements with intermittent checks on Mg)  Hypocalcemia Reduced PTH effect d/t hypomagnesemia (still waiting on Vitamin D levels ) Correcting with repletion of mag, as well as administration of calcium and rocaltrol Follow daily until stable  CKD3 Primary nephrologist Dr. Hyman Hopes Renal function better than reported baseline  Anemia Sizable drop in hb Check iron studies   Would continue with current electrolyte supplementation orally, check Mg and calcium daily. If remains stable on po suppl alone for 48 hours could prob then go to SNF from electrolyte standpoint    Johnny Bal, MD [facility] [phone] pager 03/27/2013, 1:32 PM

## 2013-03-27 NOTE — Progress Notes (Signed)
Occupational Therapy Treatment Patient Details Name: [name] MRN: [id number] DOB: 02-Jun-1941 Today's Date: 03/27/2013 Time: 9562-1308 OT Time Calculation (min): 9 min  OT Assessment / Plan / Recommendation Comments on Treatment Session Pt making good progress this visit. Wife present. Per MD who was in room at the end of session discussing d/c plans with them, pt will be going SNF at d/c.     Follow Up Recommendations  SNF    Barriers to Discharge       Equipment Recommendations  None recommended by OT (pt declines 3in1)    Recommendations for Other Services    Frequency Min 2X/week   Plan Discharge plan remains appropriate    Precautions / Restrictions Precautions Precautions: Fall Restrictions Weight Bearing Restrictions: No        ADL  Grooming: Performed;Wash/dry hands;Min guard (min verbal cues for safety with walker at sink) Where Assessed - Grooming: Unsupported standing Toilet Transfer: Performed;Min guard Toilet Transfer Method: Other (comment) (with walker into bathroom) Toilet Transfer Equipment: Comfort height toilet;Grab bars (;pt states he has counter on L side of toilet he can use) Toileting - Architect and Hygiene: Simulated;Min guard Where Assessed - Engineer, mining and Hygiene: Standing Equipment Used: Rolling walker ADL Comments: Pt doing much better than at eval. He is able to stand from EOB with supervision and walk with PT in hallway. He then transferred into bathroom on and off toilet and stood at the sink to groom. He needed min cues to negotiate tight space with walker into the bathroom and to use walker safely at the sink. He declined sitting up in chair currently. Discussed d/c plans with family and palliative MD and plan is for SNF to get stronger. Wife doesnt feel she can manage pt at home yet.     OT Diagnosis:    OT Problem List:   OT Treatment Interventions:     OT Goals Acute Rehab OT Goals OT Goal  Formulation: With patient Time For Goal Achievement: 04/08/13 Potential to Achieve Goals: Good ADL Goals Pt Will Perform Grooming: with supervision;Standing at sink ADL Goal: Grooming - Progress: Progressing toward goals Pt Will Perform Lower Body Dressing: with supervision;Sit to stand from chair Pt Will Transfer to Toilet: with supervision;Comfort height toilet ADL Goal: Toilet Transfer - Progress: Progressing toward goals Pt Will Perform Toileting - Clothing Manipulation: with supervision;Sitting on 3-in-1 or toilet;Standing ADL Goal: Toileting - Clothing Manipulation - Progress: Progressing toward goals  Visit Information  Last OT Received On: 03/27/13 Assistance Needed: +1 PT/OT Co-Evaluation/Treatment: Yes    Subjective Data  Subjective: I am good today Patient Stated Goal: agreeable to work with PT/Ot   Prior Functioning       Cognition  Cognition Arousal/Alertness: Awake/alert Overall Cognitive Status: Within Functional Limits for tasks assessed    Mobility  Bed Mobility Bed Mobility: Supine to Sit;Sit to Supine Supine to Sit: 5: Supervision;HOB elevated Sit to Supine: 5: Supervision;HOB elevated Transfers Transfers: Sit to Stand;Stand to Sit Sit to Stand: 5: Supervision;With upper extremity assist;From bed;From toilet Stand to Sit: 5: Supervision;With upper extremity assist;To toilet;To bed Details for Transfer Assistance: min verbal cues for hand placement    Exercises      Balance     End of Session OT - End of Session Activity Tolerance: Patient tolerated treatment well Patient left: in bed;with call bell/phone within reach;with family/visitor present  GO     Lennox Laity [phone] 03/27/2013, 9:37 AM

## 2013-03-27 NOTE — Progress Notes (Signed)
Mr. Seres is improving in terms of his strength. PT offering the possibility of home with PT but per family they do not think they can handle this at home-they have high caregiver stress and burnout. Following his last hospitalization he returned to the hospital 2 days later. I agree that he would do very well with rehab to get more strength and stability. He is very unhappy about my recommendation. According to family he is having paranoid thoughts which is not new, he is wanting his wife to write down where she is and what she is doing all day long while he is in the hospital. Daughter reports he has always been very controlling, anxious and angry for most of his life, with occasional moments where he is pleasant.He would probably benefit from low dose antipsychotic medication for behavior and mood stabilization. Will start him on low dose risperadol and see if this improves his behavior an paranoia-will also increase his marinol. My recommendation is for him to go to [facility] for rehab to regain his strength for returning home.  Anderson Malta, DO Palliative Medicine

## 2013-03-27 NOTE — Progress Notes (Addendum)
Physical Therapy Treatment Patient Details Name: [name] MRN: [id number] DOB: 01-23-1941 Today's Date: 03/27/2013 Time: 7829-5621 PT Time Calculation (min): 9 min  PT Assessment / Plan / Recommendation Comments on Treatment Session  Pt continuing to improve. Still note some fatigue with ambulation. Plan is still for ST rehab to maximize independence and safety.     Follow Up Recommendations   SNF     Does the patient have the potential to tolerate intense rehabilitation     Barriers to Discharge        Equipment Recommendations  None recommended by PT    Recommendations for Other Services    Frequency Min 3X/week   Plan Discharge plan remains appropriate    Precautions / Restrictions Precautions Precautions: Fall Restrictions Weight Bearing Restrictions: No   Pertinent Vitals/Pain No c/o pain    Mobility  Bed Mobility Bed Mobility: Supine to Sit;Sit to Supine Supine to Sit: 6: Modified independent (Device/Increase time) Sit to Supine: 6: Modified independent (Device/Increase time) Transfers Transfers: Sit to Stand;Stand to Sit Sit to Stand: 5: Supervision;From bed Stand to Sit: 5: Supervision;To bed Details for Transfer Assistance: min verbal cues for hand placement Ambulation/Gait Ambulation/Gait Assistance: 4: Min guard Ambulation Distance (Feet): 250 Feet Assistive device: Rolling walker Ambulation/Gait Assistance Details: VCs safety, distance from RW.  Gait Pattern: Step-through pattern;Decreased stride length    Exercises     PT Diagnosis:    PT Problem List:   PT Treatment Interventions:     PT Goals Acute Rehab PT Goals Pt will go Supine/Side to Sit: Independently PT Goal: Supine/Side to Sit - Progress: Updated due to goal met Pt will go Sit to Supine/Side: Independently PT Goal: Sit to Supine/Side - Progress: Updated due to goal met Pt will go Sit to Stand: with modified independence PT Goal: Sit to Stand - Progress: Updated due to goal  met Pt will Ambulate: 16 - 50 feet;with supervision;with rolling walker PT Goal: Ambulate - Progress: Progressing toward goal  Visit Information  Last PT Received On: 03/27/13 Assistance Needed: +1    Subjective Data  Subjective: I told you I would walk again Patient Stated Goal: rehab   Cognition  Cognition Arousal/Alertness: Awake/alert Behavior During Therapy: WFL for tasks assessed/performed Overall Cognitive Status: Within Functional Limits for tasks assessed    Balance     End of Session PT - End of Session Activity Tolerance: Patient tolerated treatment well Patient left: in bed;with call bell/phone within reach;with family/visitor present   GP     Rebeca Alert, MPT Pager: [phone]

## 2013-03-27 NOTE — Progress Notes (Addendum)
[facility] PROGRESS NOTE  [name]:[id number] DOB: 1941/07/07 DOA: 03/22/2013 PCP: Sonda Primes, MD  Brief narrative: [name] is an 72 y.o. male with a PMH of hypertension, diabetes, COPD, CAD, AAA, renal insufficiency and right lung mass status post CT-guided lung biopsy on 03/15/2013 with findings negative for malignancy, who was admitted on 03/23/2013 for evaluation of weakness and a fall. He was also having prominent muscle tremors, and a further work up revealed significant hypomagnesemia. Patient has had significant weight loss and underlying malignancy continues to be a concern. Accordingly, a palliative care consultation has been requested.  The patient continues to require IV magnesium repletion.  Assessment/Plan: Principal Problem:   Hypocalcemia, hypokalemia and hypomagnesemia with severe weakness -May be secondary to progressive kidney disease.  Placed on empiric Calcitriol and a calcium + Vitamin D supplement. -Intact PTH 63.5, within normal limits. Total calcium 9.6. -Followup vitamin D studies, which are still pending. -Magnesium stable status post 4 g of IV replacement 03/26/2013. -Nephrology consulting. 24 hour magnesium collection in place. -PPI discontinued 03/26/2013 in case it is consistent attributed to malabsorption of magnesium. -Phosphorus WNL.   -Status post PT/OT evaluations, may need skilled nursing home placement versus 24 hour supervision at home, but improvement with magnesium replacement noted. Active Problems:   Normocytic anemia -2 g drop in hemoglobin noted over the past several days. 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 -Followup ferritin, iron, and TIBC.   Left great toe pain secondary to gout flare -Uric acid level high at 8.4 consistent with gout. Resolved with prednisone 40 mg daily x3 days.   Severe protein calorie malnutrition -Albumin low. Significant anorexia. Dietitian consultation requested and the patient  was placed on Nepro supplements. -Continue Marinol for appetite stimulation.   HYPERLIPIDEMIA -Continue Lipitor. -CK WNL, no evidence of statin-induced myopathy.   ANXIETY and depression -Continue as needed alprazolam, Pamelor and Cymbalta. BuSpar discontinued secondary to concerns that it may be contributing to irritability and anorexia. Paxil discontinued in favor of Cymbalta.   TOBACCO USE DISORDER/SMOKER -Personally counseled.  Declined nicotine patch.   CORONARY ARTERY DISEASE / PERIPHERAL VASCULAR DISEASE -Continue ASA.   COPD -Respiratory status stable.   GERD / Barrett's esophagus with high grade dysplasia -Continue Pepcid. PPI discontinued due to persistently low magnesium levels.   DM (diabetes mellitus), type 2 with peripheral vascular complications / hypoglycemia -Continue to hold Amaryl.    Stage III-4 chronic kidney disease -On sodium bicarbonate.   Right Lung mass -CT guided biopsy 03/15/13 non-conclusive. -Opted to treat empirically with antibiotics then have follow up imaging done, but recently stopped empiric Levaquin secondary to problems with tremors and anorexia, thought to be related to this medication use (although this is more likely from hypomagnesemia---will resume). -Palliative care meeting held 03/24/13.  Recommendations noted.   Failure to thrive in adult -Suspect secondary to occult malignancy and electrolyte derangements.   Hypertension -Continue clonidine and labetalol.  Code Status: Full. Family Communication: Wife updated at bedside. Disposition Plan: Home with help versus SNF depending on progress.   Medical Consultants:  Telephone consultation with Dr. Elvis Coil and formal consultation with Dr. Delano Metz, Nephrology.  Dr. Julaine Fusi, Palliative care  Other Consultants:  Physical therapy: Skilled nursing home placement recommended.  Occupational therapy: Skilled nursing home placement  recommended.  Anti-infectives:  None.  HPI/Subjective: [name] is not tremulous.  He is not as irritable today per his wife. His toe pain is relieved. No nausea or vomiting.  Objective: Filed Vitals:   03/26/13 1017 03/26/13 1405 03/26/13 2100 03/27/13 0500  BP: 153/60 143/70 188/74 173/72  Pulse: 77 80 78 74  Temp:  98.2 F (36.8 C) 98 F (36.7 C) 97.6 F (36.4 C)  TempSrc:  Oral Oral Oral  Resp:  16 20 18   Height:      Weight:      SpO2:  97% 97% 100%    Intake/Output Summary (Last 24 hours) at 03/27/13 0808 Last data filed at 03/27/13 0644  Gross per 24 hour  Intake   1424 ml  Output   1780 ml  Net   -356 ml    Exam: Gen:  NAD Cardiovascular:  RRR, No M/R/G Respiratory:  Lungs CTAB Gastrointestinal:  Abdomen soft, NT/ND, + BS Extremities:  No C/E/C, mild tremors again noted  Data Reviewed: Basic Metabolic Panel:  Recent Labs Lab 03/23/13 0743 03/24/13 0353 03/24/13 1817 03/25/13 0359 03/26/13 0434 03/27/13 0404  NA 141 141  --  140 143 140  K 3.7 3.2*  --  3.7 3.9 3.7  CL 103 104  --  105 110 105  CO2 20 23  --  20 22 24   GLUCOSE 52* 120*  --  277* 163* 122*  BUN 32* 25*  --  27* 23 25*  CREATININE 2.74* 2.43*  --  2.13* 1.80* 1.61*  CALCIUM 5.9*  9.6 5.8*  --  6.0* 6.4* 7.3*  MG 0.5* 0.4* 1.8 1.5 1.2* 1.8  PHOS 4.6 3.9  --  3.4 2.6 2.4   GFR Estimated Creatinine Clearance: 39.6 ml/min (by C-G formula based on Cr of 1.61). Liver Function Tests:  Recent Labs Lab 03/22/13 2215 03/23/13 0743 03/24/13 0353 03/25/13 0359 03/26/13 0434 03/27/13 0404  AST 34 29  --   --   --   --   ALT 64* 50  --   --   --   --   ALKPHOS 166* 143*  --   --   --   --   BILITOT 0.3 0.2*  --   --   --   --   PROT 7.3 6.4  --   --   --   --   ALBUMIN 2.7* 2.3* 2.1* 2.1* 2.0* 2.1*   CBC:  Recent Labs Lab 03/22/13 2215 03/27/13 0404  WBC 19.6* 15.2*  NEUTROABS 16.6*  --   HGB 9.5* 7.6*  HCT 29.8* 25.2*  MCV 92.0 92.6  PLT 324 281    CBG:  Recent Labs Lab 03/25/13 2133 03/26/13 0735 03/26/13 1144 03/26/13 1733 03/26/13 2123  GLUCAP 210* 129* 241* 160* 95   Microbiology No results found for this or any previous visit (from the past 240 hour(s)).   Procedures and Diagnostic Studies: Dg Chest 2 View  03/22/2013   *RADIOLOGY REPORT*  Clinical Data: Fall.  Weakness.  CHEST - 2 VIEW  Comparison: 03/15/2013.  Findings: The cardiac silhouette, mediastinal and hilar contours are within normal limits and stable.  Persistent but smaller cavitary lesion in the right upper lobe.  Remote healed rib fractures noted but no acute bony findings.  IMPRESSION:  1.  Persistent but smaller cavitary right upper lobe lung lesion. 2.  No new/acute pulmonary findings. 3.  Intact bony thorax.  Remote rib fractures are noted.   Original Report Authenticated By: Rudie Meyer, M.D.   Ct Head Wo Contrast  03/22/2013   *RADIOLOGY REPORT*  Clinical Data:  Larey Seat.  Hit head.  CT HEAD WITHOUT CONTRAST CT CERVICAL SPINE  WITHOUT CONTRAST  Technique:  Multidetector CT imaging of the head and cervical spine was performed following the standard protocol without intravenous contrast.  Multiplanar CT image reconstructions of the cervical spine were also generated.  Comparison:  03/17/2013.  CT HEAD  Findings: Stable atrophy and periventricular white matter disease. No acute intracranial findings or mass lesions.  The bony structures are intact.  No acute skull or facial bone fracture.  The paranasal sinuses are clear.  IMPRESSION:  1.  No acute cranial findings.  Stable atrophy and periventricular white matter disease. 2.  No acute skull fracture.  CT CERVICAL SPINE  Findings: The sagittal reformatted images demonstrate normal alignment of the cervical vertebral bodies.  Disc spaces and vertebral bodies are maintained.  No acute bony findings or abnormal prevertebral soft tissue swelling.  The facets are normally aligned.  No facet or laminar fractures are seen. No  large disc protrusions.  The neural foramen are patent.  The skull base C1 and C1-C2 articulations are maintained.  The dens is normal.  There are scattered cervical lymph nodes.  The lung apices are clear.  IMPRESSION: Normal alignment and no acute fracture.   Original Report Authenticated By: Rudie Meyer, M.D.   Ct Cervical Spine Wo Contrast  03/22/2013   *RADIOLOGY REPORT*  Clinical Data:  Larey Seat.  Hit head.  CT HEAD WITHOUT CONTRAST CT CERVICAL SPINE WITHOUT CONTRAST  Technique:  Multidetector CT imaging of the head and cervical spine was performed following the standard protocol without intravenous contrast.  Multiplanar CT image reconstructions of the cervical spine were also generated.  Comparison:  03/17/2013.  CT HEAD  Findings: Stable atrophy and periventricular white matter disease. No acute intracranial findings or mass lesions.  The bony structures are intact.  No acute skull or facial bone fracture.  The paranasal sinuses are clear.  IMPRESSION:  1.  No acute cranial findings.  Stable atrophy and periventricular white matter disease. 2.  No acute skull fracture.  CT CERVICAL SPINE  Findings: The sagittal reformatted images demonstrate normal alignment of the cervical vertebral bodies.  Disc spaces and vertebral bodies are maintained.  No acute bony findings or abnormal prevertebral soft tissue swelling.  The facets are normally aligned.  No facet or laminar fractures are seen. No large disc protrusions.  The neural foramen are patent.  The skull base C1 and C1-C2 articulations are maintained.  The dens is normal.  There are scattered cervical lymph nodes.  The lung apices are clear.  IMPRESSION: Normal alignment and no acute fracture.   Original Report Authenticated By: Rudie Meyer, M.D.     Scheduled Meds: . aspirin EC  81 mg Oral QPM  . atorvastatin  40 mg Oral q morning - 10a  . calcitRIOL  0.25 mcg Oral Daily  . calcium-vitamin D  1 tablet Oral TID  . cholecalciferol  4,000 Units Oral  Daily  . clonazePAM  0.5 mg Oral BID  . cloNIDine  0.1 mg Oral BID  . dronabinol  2.5 mg Oral BID AC  . DULoxetine  60 mg Oral Daily  . enoxaparin (LOVENOX) injection  40 mg Subcutaneous Q24H  . famotidine  40 mg Oral BID  . feeding supplement  1 Container Oral TID WC & HS  . gabapentin  300 mg Oral QHS  . insulin aspart  0-9 Units Subcutaneous TID WC  . labetalol  300 mg Oral BID  . levofloxacin (LEVAQUIN) IV  750 mg Intravenous Q48H  . magnesium oxide  800  mg Oral BID  . multivitamin with minerals  1 tablet Oral Daily  . nicotine  21 mg Transdermal Daily  . nortriptyline  20 mg Oral QHS  . oxyCODONE-acetaminophen  1 tablet Oral QHS  . sodium bicarbonate  1,300 mg Oral Daily   Continuous Infusions:    Time spent: 25 minutes.   LOS: 5 days   RAMA,CHRISTINA  [facility] Pager [phone].  If 8PM-8AM, please contact night-coverage at [email or web address], password [facility] 03/27/2013, 8:08 AM

## 2013-03-27 NOTE — Progress Notes (Signed)
Pt still has a poor appetite. Walking with a front wheel walker in the hall with PT. Someone accidentally threw out urine, so 24 hour urine is starting over.

## 2013-03-27 NOTE — Progress Notes (Signed)
CSW assisting with d/c planning. Pt / family requesting placement at [facility]. SNF bed is available tomorrow if pt is stable for d/c.  CSW will continue to follow to assist with d/c planning.  Cori Razor LCSW [phone]

## 2013-03-28 LAB — CBC
MCHC: 31.2 g/dL (ref 30.0–36.0)
RDW: 13.4 % (ref 11.5–15.5)
WBC: 12.3 10*3/uL — ABNORMAL HIGH (ref 4.0–10.5)

## 2013-03-28 LAB — RENAL FUNCTION PANEL
CO2: 26 mEq/L (ref 19–32)
Calcium: 7.5 mg/dL — ABNORMAL LOW (ref 8.4–10.5)
GFR calc Af Amer: 47 mL/min — ABNORMAL LOW (ref >90)
GFR calc non Af Amer: 40 mL/min — ABNORMAL LOW (ref >90)
Phosphorus: 2.9 mg/dL (ref 2.3–4.6)
Potassium: 3.4 mEq/L — ABNORMAL LOW (ref 3.5–5.1)
Sodium: 139 mEq/L (ref 135–145)

## 2013-03-28 LAB — GLUCOSE, CAPILLARY
Glucose-Capillary: 92 mg/dL (ref 70–99)
Glucose-Capillary: 109 mg/dL — ABNORMAL HIGH (ref 70–99)
Glucose-Capillary: 103 mg/dL — ABNORMAL HIGH (ref 70–99)

## 2013-03-28 LAB — IRON AND TIBC
Iron: 14 ug/dL — ABNORMAL LOW (ref 42–135)
TIBC: 167 ug/dL — ABNORMAL LOW (ref 215–435)

## 2013-03-28 LAB — MAGNESIUM: Magnesium: 1.3 mg/dL — ABNORMAL LOW (ref 1.5–2.5)

## 2013-03-28 LAB — VITAMIN D 25 HYDROXY (VIT D DEFICIENCY, FRACTURES): Vit D, 25-Hydroxy: 36 ng/mL (ref 30–89)

## 2013-03-28 MED ORDER — POTASSIUM CHLORIDE CRYS ER 20 MEQ PO TBCR
40.0000 meq | EXTENDED_RELEASE_TABLET | Freq: Once | ORAL | Status: AC
  Administered 2013-03-28: 40 meq via ORAL
  Filled 2013-03-28 (×2): qty 2

## 2013-03-28 MED ORDER — MAGNESIUM OXIDE 400 (241.3 MG) MG PO TABS
800.0000 mg | ORAL_TABLET | Freq: Three times a day (TID) | ORAL | Status: DC
  Administered 2013-03-28 – 2013-03-29 (×3): 800 mg via ORAL
  Filled 2013-03-28 (×5): qty 2

## 2013-03-28 MED ORDER — MAGNESIUM SULFATE 40 MG/ML IJ SOLN
4.0000 g | Freq: Once | INTRAMUSCULAR | Status: AC
  Administered 2013-03-28: 4 g via INTRAVENOUS
  Filled 2013-03-28: qty 100

## 2013-03-28 MED ORDER — FERUMOXYTOL INJECTION 510 MG/17 ML
510.0000 mg | INTRAVENOUS | Status: DC
  Administered 2013-03-28: 510 mg via INTRAVENOUS
  Filled 2013-03-28: qty 17

## 2013-03-28 NOTE — Progress Notes (Signed)
Inpatient Diabetes Program Recommendations  AACE/ADA: New Consensus Statement on Inpatient Glycemic Control (2013)  Target Ranges:  Prepandial:   less than 140 mg/dL      Peak postprandial:   less than 180 mg/dL (1-2 hours)      Critically ill patients:  140 - 180 mg/dL   Results for JERY, HOLLERN (MRN [id number]) as of 03/28/2013 10:43  Ref. Range 03/27/2013 08:39 03/27/2013 12:25 03/27/2013 17:26 03/27/2013 21:44 03/28/2013 07:39  Glucose-Capillary Latest Range: 70-99 mg/dL 90 782 (H) 956 (H) 213 (H) 92    Inpatient Diabetes Program Recommendations Diet: May want to consider changing diet to Carb Modified Diabetic diet.  Note: Fasting blood glucose over the past two days 90 mg/dl (0/86/57) and 92 mg/dl (8/46/96) and post prandial blood glucose more elevated as reflected above.  May want to consider changing diet to Carb Modified Diabetic diet if appropriate.    Thanks, Orlando Penner, RN, MSN, CCRN Diabetes Coordinator Inpatient Diabetes Program [phone]

## 2013-03-28 NOTE — Progress Notes (Signed)
Occupational Therapy Treatment Patient Details Name: [name] MRN: [id number] DOB: 30-Mar-1941 Today's Date: 03/28/2013 Time: 1478-2956 OT Time Calculation (min): 23 min                  Plan Discharge plan remains appropriate    Precautions / Restrictions Precautions Precautions: Fall Restrictions Weight Bearing Restrictions: No       ADL  Grooming: Wash/dry face;Performed Where Assessed - Grooming: Unsupported standing Lower Body Dressing: Performed;Minimal assistance Where Assessed - Lower Body Dressing: Unsupported sit to stand Toilet Transfer: Performed;Min guard Toilet Transfer Method: Sit to Barista: Comfort height toilet Toileting - Clothing Manipulation and Hygiene: Simulated;Min guard Where Assessed - Toileting Clothing Manipulation and Hygiene: Standing Transfers/Ambulation Related to ADLs: Pt agreeable to OOB and functional activity in the room. Pt needed VC for hand placement  and safety.  Pt agreeable to post acute rehab which will help pt return home stronger       OT Goals ADL Goals ADL Goal: Grooming - Progress: Progressing toward goals ADL Goal: Lower Body Dressing - Progress: Progressing toward goals ADL Goal: Toilet Transfer - Progress: Progressing toward goals ADL Goal: Toileting - Clothing Manipulation - Progress: Progressing toward goals  Visit Information  Last OT Received On: 03/28/13 Assistance Needed: +1          Cognition  Cognition Arousal/Alertness: Awake/alert Behavior During Therapy: WFL for tasks assessed/performed Overall Cognitive Status: Within Functional Limits for tasks assessed    Mobility  Bed Mobility Bed Mobility: Supine to Sit Supine to Sit: 6: Modified independent (Device/Increase time) Details for Bed Mobility Assistance: no tremors noted today Transfers Sit to Stand: 5: Supervision;4: Min guard;From bed Stand to Sit: 5: Supervision;4: Min guard;To chair/3-in-1 Details for Transfer  Assistance: one VC on hand placement with stand to sit to recliner, otherwise just increased time          End of Session OT - End of Session Activity Tolerance: Patient tolerated treatment well Patient left: in bed;with call bell/phone within reach  GO     [facility], [place] 03/28/2013, 2:21 PM

## 2013-03-28 NOTE — Progress Notes (Signed)
[facility] PROGRESS NOTE  [name] ZOX:[id number] DOB: 07-28-1941 DOA: 03/22/2013 PCP: Sonda Primes, MD  Brief narrative: [name] is an 72 y.o. male with a PMH of hypertension, diabetes, COPD, CAD, AAA, renal insufficiency and right lung mass status post CT-guided lung biopsy on 03/15/2013 with findings negative for malignancy, who was admitted on 03/23/2013 for evaluation of weakness and a fall. He was also having prominent muscle tremors, and a further work up revealed significant hypomagnesemia. Patient has had significant weight loss and underlying malignancy continues to be a concern. Accordingly, a palliative care consultation has been requested.  The patient continues to require IV magnesium repletion. Nephrology is consulting and workup is ongoing regarding the etiology of his hypomagnesemia.  Assessment/Plan: Principal Problem:   Hypocalcemia, hypokalemia and hypomagnesemia with severe weakness -May be secondary to progressive kidney disease.  Placed on empiric Calcitriol and a calcium + Vitamin D supplement. -Intact PTH 63.5, within normal limits. Total calcium 9.6. -Vitamin D, 25-hydroxy normal at 36. Vitamin D 1, 25 dihydroxy pending. -Despite regular oral magnesium oxide replacement and a total of 12 g of IV magnesium replacement, patient's magnesium continues to drop 24 hours after his previous IV dose of magnesium and thus he still requires IV replacement. -Nephrology consulting. 24 hour magnesium collection in place. -PPI discontinued 03/26/2013 in case it is contributing to malabsorption of magnesium. -Phosphorus WNL.   -Status post PT/OT evaluations, for skilled nursing home placement. Active Problems:   Normocytic anemia -2 g drop in hemoglobin noted over the past several days, but up slightly today. 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 -Followup ferritin, iron, and TIBC.   Left great toe pain secondary to gout flare -Uric acid level high  at 8.4 consistent with gout. Resolved with prednisone 40 mg daily x3 days.   Severe protein calorie malnutrition -Albumin low. Significant anorexia. Dietitian consultation requested and the patient was placed on Nepro supplements. -Continue Marinol for appetite stimulation.   HYPERLIPIDEMIA -Continue Lipitor. -CK WNL, no evidence of statin-induced myopathy.   ANXIETY and depression -Continue as needed alprazolam, Pamelor and Cymbalta. BuSpar discontinued secondary to concerns that it may be contributing to irritability and anorexia. Paxil discontinued in favor of Cymbalta.   TOBACCO USE DISORDER/SMOKER -Personally counseled.  Declined nicotine patch.   CORONARY ARTERY DISEASE / PERIPHERAL VASCULAR DISEASE -Continue ASA.   COPD -Respiratory status stable.   GERD / Barrett's esophagus with high grade dysplasia -Continue Pepcid. PPI discontinued due to persistently low magnesium levels.   DM (diabetes mellitus), type 2 with peripheral vascular complications / hypoglycemia -Continue to hold Amaryl.    Stage III-4 chronic kidney disease -On sodium bicarbonate.   Right Lung mass -CT guided biopsy 03/15/13 non-conclusive. -Opted to treat empirically with antibiotics then have follow up imaging done, but recently stopped empiric Levaquin secondary to problems with tremors and anorexia, thought to be related to this medication use (although this is more likely from hypomagnesemia---will resume). -Palliative care meeting held 03/24/13.  Recommendations noted.   Failure to thrive in adult -Suspect secondary to occult malignancy and electrolyte derangements.   Hypertension -Continue clonidine and labetalol.  Code Status: Full. Family Communication: Wife updated at bedside 03/28/2013. Disposition Plan: Home with help versus SNF depending on progress.   Medical Consultants:  Telephone consultation with Dr. Elvis Coil and formal consultation with Dr. Delano Metz, Nephrology.  Dr. Julaine Fusi, Palliative care  Other Consultants:  Physical therapy: Skilled nursing home placement recommended.  Occupational therapy: Skilled nursing  home placement recommended.  Anti-infectives:  None.  HPI/Subjective: [name] is not tremulous.  He is sleepy. He denies dyspnea, pain, states his bowels are moving.  Objective: Filed Vitals:   03/27/13 1336 03/27/13 2155 03/28/13 0547 03/28/13 0550  BP: 180/69 149/68 180/74 178/70  Pulse: 67 77 77   Temp: 97.7 F (36.5 C) 98.1 F (36.7 C) 98.3 F (36.8 C)   TempSrc:  Oral Oral   Resp: 16 24 24    Height:      Weight:      SpO2: 98% 97% 97%     Intake/Output Summary (Last 24 hours) at 03/28/13 0753 Last data filed at 03/28/13 0540  Gross per 24 hour  Intake   1482 ml  Output   1250 ml  Net    232 ml    Exam: Gen:  NAD Cardiovascular:  RRR, No M/R/G Respiratory:  Lungs CTAB Gastrointestinal:  Abdomen soft, NT/ND, + BS Extremities:  No C/E/C  Data Reviewed: Basic Metabolic Panel:  Recent Labs Lab 03/24/13 0353 03/24/13 1817 03/25/13 0359 03/26/13 0434 03/27/13 0404 03/28/13 0411  NA 141  --  140 143 140 139  K 3.2*  --  3.7 3.9 3.7 3.4*  CL 104  --  105 110 105 105  CO2 23  --  20 22 24 26   GLUCOSE 120*  --  277* 163* 122* 100*  BUN 25*  --  27* 23 25* 24*  CREATININE 2.43*  --  2.13* 1.80* 1.61* 1.65*  CALCIUM 5.8*  --  6.0* 6.4* 7.3* 7.5*  MG 0.4* 1.8 1.5 1.2* 1.8 1.3*  PHOS 3.9  --  3.4 2.6 2.4 2.9   GFR Estimated Creatinine Clearance: 38.6 ml/min (by C-G formula based on Cr of 1.65). Liver Function Tests:  Recent Labs Lab 03/22/13 2215 03/23/13 0743 03/24/13 0353 03/25/13 0359 03/26/13 0434 03/27/13 0404 03/28/13 0411  AST 34 29  --   --   --   --   --   ALT 64* 50  --   --   --   --   --   ALKPHOS 166* 143*  --   --   --   --   --   BILITOT 0.3 0.2*  --   --   --   --   --   PROT 7.3 6.4  --   --   --   --   --   ALBUMIN 2.7* 2.3* 2.1* 2.1* 2.0* 2.1* 1.9*   CBC:  Recent  Labs Lab 03/22/13 2215 03/27/13 0404 03/28/13 0411  WBC 19.6* 15.2* 12.3*  NEUTROABS 16.6*  --   --   HGB 9.5* 7.6* 8.1*  HCT 29.8* 25.2* 26.0*  MCV 92.0 92.6 93.2  PLT 324 281 307   CBG:  Recent Labs Lab 03/27/13 0839 03/27/13 1225 03/27/13 1726 03/27/13 2144 03/28/13 0739  GLUCAP 90 182* 163* 208* 92   Microbiology No results found for this or any previous visit (from the past 240 hour(s)).   Procedures and Diagnostic Studies: Dg Chest 2 View  03/22/2013   *RADIOLOGY REPORT*  Clinical Data: Fall.  Weakness.  CHEST - 2 VIEW  Comparison: 03/15/2013.  Findings: The cardiac silhouette, mediastinal and hilar contours are within normal limits and stable.  Persistent but smaller cavitary lesion in the right upper lobe.  Remote healed rib fractures noted but no acute bony findings.  IMPRESSION:  1.  Persistent but smaller cavitary right upper lobe lung lesion. 2.  No new/acute pulmonary findings. 3.  Intact bony thorax.  Remote rib fractures are noted.   Original Report Authenticated By: Rudie Meyer, M.D.   Ct Head Wo Contrast  03/22/2013   *RADIOLOGY REPORT*  Clinical Data:  Larey Seat.  Hit head.  CT HEAD WITHOUT CONTRAST CT CERVICAL SPINE WITHOUT CONTRAST  Technique:  Multidetector CT imaging of the head and cervical spine was performed following the standard protocol without intravenous contrast.  Multiplanar CT image reconstructions of the cervical spine were also generated.  Comparison:  03/17/2013.  CT HEAD  Findings: Stable atrophy and periventricular white matter disease. No acute intracranial findings or mass lesions.  The bony structures are intact.  No acute skull or facial bone fracture.  The paranasal sinuses are clear.  IMPRESSION:  1.  No acute cranial findings.  Stable atrophy and periventricular white matter disease. 2.  No acute skull fracture.  CT CERVICAL SPINE  Findings: The sagittal reformatted images demonstrate normal alignment of the cervical vertebral bodies.  Disc  spaces and vertebral bodies are maintained.  No acute bony findings or abnormal prevertebral soft tissue swelling.  The facets are normally aligned.  No facet or laminar fractures are seen. No large disc protrusions.  The neural foramen are patent.  The skull base C1 and C1-C2 articulations are maintained.  The dens is normal.  There are scattered cervical lymph nodes.  The lung apices are clear.  IMPRESSION: Normal alignment and no acute fracture.   Original Report Authenticated By: Rudie Meyer, M.D.   Ct Cervical Spine Wo Contrast  03/22/2013   *RADIOLOGY REPORT*  Clinical Data:  Larey Seat.  Hit head.  CT HEAD WITHOUT CONTRAST CT CERVICAL SPINE WITHOUT CONTRAST  Technique:  Multidetector CT imaging of the head and cervical spine was performed following the standard protocol without intravenous contrast.  Multiplanar CT image reconstructions of the cervical spine were also generated.  Comparison:  03/17/2013.  CT HEAD  Findings: Stable atrophy and periventricular white matter disease. No acute intracranial findings or mass lesions.  The bony structures are intact.  No acute skull or facial bone fracture.  The paranasal sinuses are clear.  IMPRESSION:  1.  No acute cranial findings.  Stable atrophy and periventricular white matter disease. 2.  No acute skull fracture.  CT CERVICAL SPINE  Findings: The sagittal reformatted images demonstrate normal alignment of the cervical vertebral bodies.  Disc spaces and vertebral bodies are maintained.  No acute bony findings or abnormal prevertebral soft tissue swelling.  The facets are normally aligned.  No facet or laminar fractures are seen. No large disc protrusions.  The neural foramen are patent.  The skull base C1 and C1-C2 articulations are maintained.  The dens is normal.  There are scattered cervical lymph nodes.  The lung apices are clear.  IMPRESSION: Normal alignment and no acute fracture.   Original Report Authenticated By: Rudie Meyer, M.D.     Scheduled  Meds: . ALPRAZolam  1 mg Oral TID  . aspirin EC  81 mg Oral QPM  . atorvastatin  40 mg Oral q morning - 10a  . calcitRIOL  0.25 mcg Oral Daily  . calcium-vitamin D  1 tablet Oral TID  . cholecalciferol  4,000 Units Oral Daily  . cloNIDine  0.1 mg Oral BID  . dronabinol  5 mg Oral BID AC  . DULoxetine  60 mg Oral Daily  . enoxaparin (LOVENOX) injection  40 mg Subcutaneous Q24H  . famotidine  40 mg Oral BID  .  feeding supplement  1 Container Oral TID WC & HS  . gabapentin  300 mg Oral BID  . insulin aspart  0-9 Units Subcutaneous TID WC  . labetalol  300 mg Oral BID  . levofloxacin (LEVAQUIN) IV  750 mg Intravenous Q48H  . magnesium oxide  800 mg Oral BID  . multivitamin with minerals  1 tablet Oral Daily  . nortriptyline  20 mg Oral QHS  . oxyCODONE-acetaminophen  1 tablet Oral QHS  . risperiDONE  0.5 mg Oral BID  . sodium bicarbonate  1,300 mg Oral Daily   Continuous Infusions:    Time spent: 25 minutes.   LOS: 6 days   RAMA,CHRISTINA  [facility] Pager [phone].  If 8PM-8AM, please contact night-coverage at [email or web address], password [email or web address] 03/28/2013, 7:53 AM

## 2013-03-28 NOTE — Progress Notes (Signed)
Subjective:  Plans for SNF underway Required IV Mg again today  (Has required about 4 grams QOD) also on po Calcium just oral now Wife in the room wondering about discharge Patient fairly passive - "I'm feeling better"  Objective Vital signs in last 24 hours: Filed Vitals:   03/27/13 2155 03/28/13 0547 03/28/13 0550 03/28/13 1350  BP: 149/68 180/74 178/70 144/56  Pulse: 77 77  76  Temp: 98.1 F (36.7 C) 98.3 F (36.8 C)  98 F (36.7 C)  TempSrc: Oral Oral    Resp: 24 24  20   Height:      Weight:      SpO2: 97% 97%  98%   Weight change:   Intake/Output Summary (Last 24 hours) at 03/28/13 1445 Last data filed at 03/28/13 1300  Gross per 24 hour  Intake    960 ml  Output   1350 ml  Net   -390 ml   Physical Exam:  Blood pressure 173/72, pulse 74, temperature 97.6 F (36.4 C), temperature source Oral, resp. rate 18, height 5\' 9"  (1.753 m), weight 66.5 kg (146 lb 9.7 oz), SpO2 100.00%. Balding WM with pigm skin lesions Lungs Clear No rub  Rhythm regular Abdomen soft without tenderness No edema of LE's  Basic Metabolic Panel:  Recent Labs Lab 03/23/13 0400 03/23/13 0743 03/24/13 0353 03/25/13 0359 03/26/13 0434 03/27/13 0404 03/28/13 0411  NA 141 141 141 140 143 140 139  K 3.6 3.7 3.2* 3.7 3.9 3.7 3.4*  CL 104 103 104 105 110 105 105  CO2 21 20 23 20 22 24 26   GLUCOSE 55* 52* 120* 277* 163* 122* 100*  BUN 33* 32* 25* 27* 23 25* 24*  CREATININE 2.73* 2.74* 2.43* 2.13* 1.80* 1.61* 1.65*  CALCIUM 5.8* 5.9*  9.6 5.8* 6.0* 6.4* 7.3* 7.5*  PHOS  --  4.6 3.9 3.4 2.6 2.4 2.9   Recent Labs Lab 03/22/13 2215 03/23/13 0743  03/26/13 0434 03/27/13 0404 03/28/13 0411  AST 34 29  --   --   --   --   ALT 64* 50  --   --   --   --   ALKPHOS 166* 143*  --   --   --   --   BILITOT 0.3 0.2*  --   --   --   --   PROT 7.3 6.4  --   --   --   --   ALBUMIN 2.7* 2.3*  < > 2.0* 2.1* 1.9*  < > = values in this interval not displayed. No results found for this basename:  LIPASE, AMYLASE,  in the last 168 hours No results found for this basename: AMMONIA,  in the last 168 hours  CBC:  Recent Labs Lab 03/22/13 2215 03/27/13 0404 03/28/13 0411  WBC 19.6* 15.2* 12.3*  NEUTROABS 16.6*  --   --   HGB 9.5* 7.6* 8.1*  HCT 29.8* 25.2* 26.0*  MCV 92.0 92.6 93.2  PLT 324 281 307    Results for ARIN, PERAL (MRN [id number]) as of 03/28/2013 14:44  Ref. Range 01/22/2009 04:00 03/23/2013 07:43 03/24/2013 03:53 03/24/2013 18:17 03/25/2013 03:59 03/26/2013 04:34 03/27/2013 04:04 03/28/2013 04:11  Magnesium Range: 1.5-2.5 mg/dL 1.2 (L) 0.5 (LL) 0.4 (LL) 1.8 1.5 1.2 (L) 1.8 1.3 (L)   25 Vit D level 36 (WNL)  Results for SHIMON, TROWBRIDGE (MRN [id number]) as of 03/27/2013 14:22  Ref. Range 03/23/2013 07:43  PTH 14.0-72.0 pg/mL 63.5   Results for [place], Remi Deter  Shela Commons (MRN [id number]) as of 03/28/2013 14:44  Ref. Range 03/28/2013 04:11  Iron Latest Range: 42-135 ug/dL 14 (L)  UIBC Latest Range: 125-400 ug/dL 562  TIBC Latest Range: 215-435 ug/dL 130 (L)  Saturation Ratios Latest Range: 20-55 % 8 (L)  Ferritin Latest Range: 22-322 ng/mL 146   Studies/Results: No results found. Medications:   . ALPRAZolam  1 mg Oral TID  . aspirin EC  81 mg Oral QPM  . atorvastatin  40 mg Oral q morning - 10a  . calcitRIOL  0.25 mcg Oral Daily  . calcium-vitamin D  1 tablet Oral TID  . cholecalciferol  4,000 Units Oral Daily  . cloNIDine  0.1 mg Oral BID  . dronabinol  5 mg Oral BID AC  . DULoxetine  60 mg Oral Daily  . enoxaparin (LOVENOX) injection  40 mg Subcutaneous Q24H  . famotidine  40 mg Oral BID  . feeding supplement  1 Container Oral TID WC & HS  . gabapentin  300 mg Oral BID  . insulin aspart  0-9 Units Subcutaneous TID WC  . labetalol  300 mg Oral BID  . levofloxacin (LEVAQUIN) IV  750 mg Intravenous Q48H  . magnesium oxide  800 mg Oral BID  . multivitamin with minerals  1 tablet Oral Daily  . nortriptyline  20 mg Oral QHS  . oxyCODONE-acetaminophen  1 tablet Oral QHS  .  potassium chloride  40 mEq Oral Once  . risperiDONE  0.5 mg Oral BID  . sodium bicarbonate  1,300 mg Oral Daily     I  have reviewed scheduled and prn medications.  ASSESSMENT/RECOMMENDATIONS:  Hypomagnesemia  Still requiring intermittent IV Mg (averaging about 4 grams qod) Would increase po suppl to TID Most likely related to long years of PPI use which can cause malabsorption of Mg (urine studies still pending but results would not change management)  Follow daily until stable Should remain off PPI if possible (but if not should remain on supplements with intermittent checks on Mg)  Hypocalcemia Reduced PTH effect d/t hypomagnesemia Vit D, PTH normal Correcting with repletion of mag, as well as administration of calcium and rocaltrol Follow daily until stable  CKD3 Primary nephrologist Dr. Hyman Hopes Renal function better than reported baseline  Anemia Sizable drop in hb Iron studies c/w iron deficiency Feraheme ordered (510 IV X 2)    Camille Bal, MD [facility] [phone] pager 03/28/2013, 2:45 PM

## 2013-03-28 NOTE — Progress Notes (Signed)
Physical Therapy Treatment Patient Details Name: [name] MRN: [id number] DOB: 05/30/1941 Today's Date: 03/28/2013 Time: 4098-1191 PT Time Calculation (min): 17 min  PT Assessment / Plan / Recommendation Comments on Treatment Session  Pt progressing well. Pt stated he was recently D/C from the hospital and went to [facility].  Pt stated he was hopeful to "go home today".    Follow Up Recommendations  SNF;Supervision/Assistance - 24 hour     Does the patient have the potential to tolerate intense rehabilitation     Barriers to Discharge        Equipment Recommendations  None recommended by PT    Recommendations for Other Services    Frequency Min 3X/week   Plan Discharge plan remains appropriate    Precautions / Restrictions Precautions Precautions: Fall;None Restrictions Weight Bearing Restrictions: No   Pertinent Vitals/Pain No c/o pain    Mobility  Bed Mobility Bed Mobility: Supine to Sit Supine to Sit: 6: Modified independent (Device/Increase time) Details for Bed Mobility Assistance: no tremors noted today Transfers Transfers: Sit to Stand;Stand to Sit Sit to Stand: 5: Supervision;4: Min guard;From bed Stand to Sit: 5: Supervision;4: Min guard;To chair/3-in-1 Details for Transfer Assistance: one VC on hand placement with stand to sit to recliner, otherwise just increased time Ambulation/Gait Ambulation/Gait Assistance: 4: Min guard Ambulation Distance (Feet): 255 Feet Assistive device: Rolling walker Ambulation/Gait Assistance Details: <25% VC's on safety with turns and proper walker to self distance with upright posture Gait Pattern: Step-through pattern;Decreased stride length Gait velocity: decreased     PT Goals                                                        progressing    Visit Information  Last PT Received On: 03/28/13    Subjective Data      Cognition    good   Balance   fair with RW  End of Session PT - End of  Session Equipment Utilized During Treatment: Gait belt Activity Tolerance: Patient tolerated treatment well Patient left: in chair;with family/visitor present;with call bell/phone within reach   Felecia Shelling  PTA [facility]  Acute  Rehab Pager      [phone]

## 2013-03-29 ENCOUNTER — Ambulatory Visit: Payer: Medicare Other | Admitting: Internal Medicine

## 2013-03-29 DIAGNOSIS — J449 Chronic obstructive pulmonary disease, unspecified: Secondary | ICD-10-CM

## 2013-03-29 LAB — MAGNESIUM, URINE, 24 HOUR
Magnesium, Ur: 9.3 mg/dL
Magnesium,Urine 24hr: 167 mg/d (ref 12–199)
Time.: 24 hours

## 2013-03-29 LAB — RENAL FUNCTION PANEL
Albumin: 2 g/dL — ABNORMAL LOW (ref 3.5–5.2)
BUN: 23 mg/dL (ref 6–23)
GFR calc Af Amer: 45 mL/min — ABNORMAL LOW (ref >90)
GFR calc non Af Amer: 38 mL/min — ABNORMAL LOW (ref >90)
Phosphorus: 3.4 mg/dL (ref 2.3–4.6)
Potassium: 3.3 mEq/L — ABNORMAL LOW (ref 3.5–5.1)
Sodium: 138 mEq/L (ref 135–145)

## 2013-03-29 LAB — CALCIUM, URINE, 24 HOUR: Urine Total Volume-UCA24: 1800 mL

## 2013-03-29 LAB — MAGNESIUM: Magnesium: 2 mg/dL (ref 1.5–2.5)

## 2013-03-29 MED ORDER — CALCITRIOL 0.25 MCG PO CAPS: 0.2500 ug | ORAL_CAPSULE | Freq: Every day | ORAL | Status: DC

## 2013-03-29 MED ORDER — VITAMIN D3 25 MCG (1000 UNIT) PO TABS: 4000.0000 [IU] | ORAL_TABLET | Freq: Every day | ORAL | Status: DC

## 2013-03-29 MED ORDER — NORTRIPTYLINE HCL 10 MG PO CAPS: 20.0000 mg | ORAL_CAPSULE | Freq: Every day | ORAL | Status: DC

## 2013-03-29 MED ORDER — MAGNESIUM OXIDE 400 (241.3 MG) MG PO TABS: 800.0000 mg | ORAL_TABLET | Freq: Three times a day (TID) | ORAL | Status: DC

## 2013-03-29 MED ORDER — CALCIUM CARBONATE-VITAMIN D 500-200 MG-UNIT PO TABS: 1.0000 | ORAL_TABLET | Freq: Three times a day (TID) | ORAL | Status: DC

## 2013-03-29 MED ORDER — LEVOFLOXACIN 750 MG PO TABS: 750.0000 mg | ORAL_TABLET | ORAL | Status: DC

## 2013-03-29 MED ORDER — POTASSIUM CHLORIDE CRYS ER 20 MEQ PO TBCR
40.0000 meq | EXTENDED_RELEASE_TABLET | Freq: Once | ORAL | Status: AC
  Administered 2013-03-29: 40 meq via ORAL
  Filled 2013-03-29: qty 2

## 2013-03-29 MED ORDER — DULOXETINE HCL 60 MG PO CPEP: 60.0000 mg | ORAL_CAPSULE | Freq: Every day | ORAL | Status: DC

## 2013-03-29 MED ORDER — FERROUS SULFATE 325 (65 FE) MG PO TABS: 325.0000 mg | ORAL_TABLET | Freq: Two times a day (BID) | ORAL | Status: DC

## 2013-03-29 MED ORDER — LEVOFLOXACIN 750 MG PO TABS: 750.0000 mg | ORAL_TABLET | ORAL | Status: AC

## 2013-03-29 MED ORDER — ALPRAZOLAM 0.5 MG PO TABS: 0.5000 mg | ORAL_TABLET | Freq: Two times a day (BID) | ORAL | Status: DC | PRN

## 2013-03-29 MED ORDER — FAMOTIDINE 40 MG PO TABS: 40.0000 mg | ORAL_TABLET | Freq: Two times a day (BID) | ORAL | Status: DC

## 2013-03-29 MED ORDER — OXYCODONE-ACETAMINOPHEN 5-325 MG PO TABS: 1.0000 | ORAL_TABLET | Freq: Every day | ORAL | Status: DC

## 2013-03-29 MED ORDER — DRONABINOL 5 MG PO CAPS: 5.0000 mg | ORAL_CAPSULE | Freq: Two times a day (BID) | ORAL | Status: DC

## 2013-03-29 MED ORDER — BOOST / RESOURCE BREEZE PO LIQD: 1.0000 | Freq: Three times a day (TID) | ORAL | Status: DC

## 2013-03-29 MED ORDER — FERUMOXYTOL INJECTION 510 MG/17 ML: 510.0000 mg | Freq: Once | INTRAVENOUS | Status: DC

## 2013-03-29 MED ORDER — RISPERIDONE 0.5 MG PO TABS: 0.5000 mg | ORAL_TABLET | Freq: Two times a day (BID) | ORAL | Status: DC

## 2013-03-29 NOTE — Progress Notes (Signed)
Subjective:  Ready to go to SNF Objective Vital signs in last 24 hours: Filed Vitals:   03/28/13 0550 03/28/13 1350 03/28/13 2100 03/29/13 0500  BP: 178/70 144/56 166/79 167/64  Pulse:  76 86 69  Temp:  98 F (36.7 C) 98.7 F (37.1 C) 98.1 F (36.7 C)  TempSrc:   Oral Oral  Resp:  20 18 22   Height:      Weight:      SpO2:  98% 97% 94%   Weight change:   Intake/Output Summary (Last 24 hours) at 03/29/13 1213 Last data filed at 03/29/13 0802  Gross per 24 hour  Intake    960 ml  Output   1175 ml  Net   -215 ml   Physical Exam:  Blood pressure 173/72, pulse 74, temperature 97.6 F (36.4 C), temperature source Oral, resp. rate 18, height 5\' 9"  (1.753 m), weight 66.5 kg (146 lb 9.7 oz), SpO2 100.00%. Balding WM with pigm skin lesions Lungs Clear No rub  Rhythm regular Abdomen soft without tenderness No edema of LE's Clothes on and ready to go to SNF  Basic Metabolic Panel:  Recent Labs Lab 03/23/13 0743 03/24/13 0353 03/25/13 0359 03/26/13 0434 03/27/13 0404 03/28/13 0411 03/29/13 0355  NA 141 141 140 143 140 139 138  K 3.7 3.2* 3.7 3.9 3.7 3.4* 3.3*  CL 103 104 105 110 105 105 102  CO2 20 23 20 22 24 26 26   GLUCOSE 52* 120* 277* 163* 122* 100* 113*  BUN 32* 25* 27* 23 25* 24* 23  CREATININE 2.74* 2.43* 2.13* 1.80* 1.61* 1.65* 1.71*  CALCIUM 5.9*  9.6 5.8* 6.0* 6.4* 7.3* 7.5* 8.3*  PHOS 4.6 3.9 3.4 2.6 2.4 2.9 3.4   Recent Labs Lab 03/22/13 2215 03/23/13 0743  03/27/13 0404 03/28/13 0411 03/29/13 0355  AST 34 29  --   --   --   --   ALT 64* 50  --   --   --   --   ALKPHOS 166* 143*  --   --   --   --   BILITOT 0.3 0.2*  --   --   --   --   PROT 7.3 6.4  --   --   --   --   ALBUMIN 2.7* 2.3*  < > 2.1* 1.9* 2.0*  < > = values in this interval not displayed.  Recent Labs Lab 03/22/13 2215 03/27/13 0404 03/28/13 0411  WBC 19.6* 15.2* 12.3*  NEUTROABS 16.6*  --   --   HGB 9.5* 7.6* 8.1*  HCT 29.8* 25.2* 26.0*  MCV 92.0 92.6 93.2  PLT 324 281  307     Results for [name], [name] (MRN [id number]) as of 03/29/2013 12:14  Ref. Range 01/22/2009 04:00 03/23/2013 07:43 03/24/2013 03:53 03/24/2013 18:17 03/25/2013 03:59 03/26/2013 04:34 03/27/2013 04:04 03/28/2013 04:11 03/29/2013 03:55  Mg++ Range: 1.5-2.5 mg/dL 1.2 (L) 0.5 (LL) 0.4 (LL) 1.8 1.5 1.2 (L) 1.8 1.3 (L) 2.0   25 Vit D level 36 (WNL)  Results for [name], [name] (MRN [id number]) as of 03/27/2013 14:22  Ref. Range 03/23/2013 07:43  PTH 14.0-72.0 pg/mL 63.5   Results for [name], [name] (MRN [id number]) as of 03/28/2013 14:44  Ref. Range 03/28/2013 04:11  Iron Latest Range: 42-135 ug/dL 14 (L)  UIBC Latest Range: 125-400 ug/dL 086  TIBC Latest Range: 215-435 ug/dL 578 (L)  Saturation Ratios Latest Range: 20-55 % 8 (L)  Ferritin Latest Range: 22-322 ng/mL 146  Studies/Results: No results found. Medications:   . ALPRAZolam  1 mg Oral TID  . aspirin EC  81 mg Oral QPM  . atorvastatin  40 mg Oral q morning - 10a  . calcitRIOL  0.25 mcg Oral Daily  . calcium-vitamin D  1 tablet Oral TID  . cholecalciferol  4,000 Units Oral Daily  . cloNIDine  0.1 mg Oral BID  . dronabinol  5 mg Oral BID AC  . DULoxetine  60 mg Oral Daily  . enoxaparin (LOVENOX) injection  40 mg Subcutaneous Q24H  . famotidine  40 mg Oral BID  . feeding supplement  1 Container Oral TID WC & HS  . ferumoxytol  510 mg Intravenous Q3 days  . gabapentin  300 mg Oral BID  . insulin aspart  0-9 Units Subcutaneous TID WC  . labetalol  300 mg Oral BID  . [START ON 03/30/2013] levofloxacin  750 mg Oral Q48H  . magnesium oxide  800 mg Oral TID  . multivitamin with minerals  1 tablet Oral Daily  . nortriptyline  20 mg Oral QHS  . oxyCODONE-acetaminophen  1 tablet Oral QHS  . risperiDONE  0.5 mg Oral BID  . sodium bicarbonate  1,300 mg Oral Daily     I  have reviewed scheduled and prn medications.  ASSESSMENT/RECOMMENDATIONS:  Hypomagnesemia  Still requiring intermittent IV Mg (averaging about 4 grams qod) Last was  yesterday (5/27) Best today that has been post IV yesterday plus increased po to TID (as long as could stay above 1.3 or so would be OK) Most likely related in part to long years of PPI use which can cause malabsorption of Mg (urine studies show sig magnesium excretion but done during time of a magnesium infusion so results not very indicative of whether true renal wasting or not but  results do not change management)  Should remain off PPI if possible (but if not should remain on supplements with intermittent checks on Mg)  Hypocalcemia Reduced PTH effect d/t hypomagnesemia Vit D, PTH normal Correcting slowly with repletion of mag, as well as administration of calcium and rocaltrol  CKD3 Primary nephrologist Dr. Hyman Hopes Renal function at or slighly better better than baseline  Anemia Sizable drop in hb Iron studies c/w iron deficiency Feraheme ordered (510 IV X 2)but since going home wiill need to be on oral instead (received 1 dose  Dr. Hyman Hopes can see in F/U (I will check with the office to see when next due - jhe sees yearly or os)  Camille Bal, MD [facility] [phone] pager 03/29/2013, 12:13 PM

## 2013-03-29 NOTE — Progress Notes (Signed)
Pt for discharge to [facility].  CSW facilitated pt discharge needs including contacting facility, faxing pt discharge information via [facility], discussing with pt and pt wife at bedside, providing RN phone number to call report, and requesting ambulance transportation for pt to [facility].  No further social work needs identified at this time.  CSW signing off.   Jacklynn Lewis, MSW, LCSWA  Clinical Social Work [phone]

## 2013-03-29 NOTE — Discharge Summary (Addendum)
Physician Discharge Summary  NAGI FURIO ZOX:[id number] DOB: 04-Mar-1941 DOA: 03/22/2013  PCP: Sonda Primes, MD  Admit date: 03/22/2013 Discharge date: 03/29/2013  Recommendations for Outpatient Follow-up:   Please note the addition of magnesium supplement 3 times daily. Recheck magnesium as well as potassium at least every 3 days to make sure it is within normal limits.  Please note that patient will continue Levaquin every other day for total of 3 more doses upon discharge.  Discharge Diagnoses:  Principal Problem:   Hypocalcemia with severe weakness Active Problems:   HYPERLIPIDEMIA   ANXIETY   TOBACCO USE DISORDER/SMOKER-SMOKING CESSATION DISCUSSED   DEPRESSION   CORONARY ARTERY DISEASE   PERIPHERAL VASCULAR DISEASE   COPD   GERD   Barrett's esophagus with high grade dysplasia   DM (diabetes mellitus), type 2 with peripheral vascular complications   Renal failure (ARF), acute on chronic   Right Lung mass   Hypokalemia   Failure to thrive in adult   Hypoglycemia   Protein-calorie malnutrition, severe   Hypomagnesemia   Normocytic anemia  Discharge Condition: Medically stable for discharge to skilled nursing facility today  Diet recommendation: As tolerated  History of present illness:  72 y.o. male with past medical history of hypertension, diabetes, COPD, CAD, AAA, renal insufficiency and right lung mass status post CT-guided lung biopsy on 03/15/2013 with findings negative for malignancy, who was admitted on 03/23/2013 for evaluation of weakness and a fall. In addition, patient reported having prominent muscle tremors and subsequent work up revealed significant hypomagnesemia. Patient has had significant weight loss and underlying malignancy continues to be a concern. Accordingly, a palliative care consultation has been requested. Nephrology was consulted for input on hypomagnesemia workup.  Assessment/Plan:   Principal Problem:  Hypocalcemia, hypokalemia and  hypomagnesemia with severe weakness  -Likely secondary to progression of kidney disease. Continue empiric Calcitriol and a calcium + Vitamin D supplement.  -Intact PTH 63.5, within normal limits. Total calcium 9.6.  -Vitamin D, 25-hydroxy normal at 36. Vitamin D 1, 25 dihydroxy still pending.  -Patient has required total of 12 g of IV magnesium replacement -Nephrology consulting. 24 hour magnesium collection in place. Magnesium is within normal limits today. We will continue magnesium supplements 3 times daily in a nursing home. -PPI discontinued 03/26/2013 in case it is contributing to malabsorption of magnesium.  -Phosphorus WNL.  -Per physical therapy evaluation patient is a candidate for skilled nursing facility placement.   Active Problems:  Normocytic anemia  - Secondary to anemia of chronic disease - Per nephrology patient is on IV iron supplementation. Continue ferrous sulfate 325 mg PO BID in nursing home. Left great toe pain secondary to gout flare  - Uric acid level high at 8.4 consistent with gout. Resolved with prednisone 40 mg daily x3 days.  Severe protein calorie malnutrition  - Albumin low. Significant anorexia. Per dietitian consultation we will continue Nepro supplements.  - Continue Marinol for appetite stimulation.  HYPERLIPIDEMIA  - Continue Lipitor.  - CK WNL, no evidence of statin-induced myopathy.  ANXIETY and depression  - Continue as needed alprazolam, Pamelor and Cymbalta. BuSpar discontinued secondary to concerns that it may be contributing to irritability and anorexia. Paxil discontinued in favor of Cymbalta.  TOBACCO USE DISORDER/SMOKER  - Patient counseled on smoking cessation  CORONARY ARTERY DISEASE / PERIPHERAL VASCULAR DISEASE  - Continue ASA.  COPD  - Respiratory status stable.  GERD / Barrett's esophagus with high grade dysplasia  - Continue Pepcid. PPI discontinued due to persistently  low magnesium levels.  DM (diabetes mellitus), type 2 with  peripheral vascular complications / hypoglycemia  -Old CBGs above 100. Can continue Amaryl on discharge.  Stage III-4 chronic kidney disease  - On sodium bicarbonate.  Right Lung mass  - CT guided biopsy 03/15/13 non-conclusive.  - Levaquin being given every other day and to receive 3 more doses on discharge Failure to thrive in adult  - Suspect secondary to occult malignancy and electrolyte derangements.  Hypertension  - Continue clonidine and labetalol.   Code Status: DNR/DNI Family Communication: Wife updated at bedside 03/29/2013.    Medical Consultants:  Telephone consultation with Dr. Elvis Coil and formal consultation with Dr. Delano Metz, Nephrology.  Dr. Julaine Fusi, Palliative care Other Consultants:  Physical therapy: Skilled nursing home placement recommended.  Occupational therapy: Skilled nursing home placement recommended. Diabetic coordinator Anti-infectives:  None.    Discharge Exam: Filed Vitals:   03/29/13 0500  BP: 167/64  Pulse: 69  Temp: 98.1 F (36.7 C)  Resp: 22   Filed Vitals:   03/28/13 0550 03/28/13 1350 03/28/13 2100 03/29/13 0500  BP: 178/70 144/56 166/79 167/64  Pulse:  76 86 69  Temp:  98 F (36.7 C) 98.7 F (37.1 C) 98.1 F (36.7 C)  TempSrc:   Oral Oral  Resp:  20 18 22   Height:      Weight:      SpO2:  98% 97% 94%    General: Pt is alert, follows commands appropriately, not in acute distress Cardiovascular: Regular rate and rhythm, S1/S2 +, no murmurs, no rubs, no gallops Respiratory: Clear to auscultation bilaterally, no wheezing, no crackles, no rhonchi Abdominal: Soft, non tender, non distended, bowel sounds +, no guarding Extremities: no edema, no cyanosis, pulses palpable bilaterally DP and PT Neuro: Grossly nonfocal  Discharge Instructions  Discharge Orders   Future Appointments Provider Department Dept Phone   04/10/2013 9:30 AM Lupita Leash, MD [facility] [phone]   04/17/2013 10:45 AM Iva Boop, MD [facility] [phone]   05/22/2013 9:30 AM Tresa Garter, MD [facility] [phone]   04/19/2014 8:30 AM [facility] [phone]   Eat a light meal the night before the exam but please avoid gaseous foods.   Nothing to eat or drink for at least 8 hours prior to the exam. No gum chewing or smoking the morning of the exam. Please take your morning medications with small sips of water, especially blood pressure medication. If you have several vascular lab exams and will see physician, please bring a snack with you.   04/19/2014 9:00 AM Evern Bio, NP [facility] [phone]   Future Orders Complete By Expires     Call MD for:  extreme fatigue  As directed     Call MD for:  hives  As directed     Call MD for:  redness, tenderness, or signs of infection (pain, swelling, redness, odor or green/yellow discharge around incision site)  As directed     Call MD for:  severe uncontrolled pain  As directed     Diet - low sodium heart healthy  As directed     Discharge instructions  As directed     Comments:      Please note the addition of magnesium supplement 3 times daily. Recheck magnesium as well as potassium at least every 3 days to make sure it is within normal limits.  Please note that patient will continue Levaquin every other day for total of 3 more doses upon discharge.    Increase activity slowly  As directed         Medication List    STOP taking these medications       busPIRone 15 MG tablet  Commonly known as:  BUSPAR     PARoxetine 20 MG tablet  Commonly known as:  PAXIL      TAKE these medications       ALPRAZolam 0.5 MG tablet  Commonly known as:  XANAX  Take 1 tablet (0.5 mg total) by mouth 2 (two) times daily as needed for sleep or anxiety.     aspirin EC 81 MG tablet  Take 81 mg by mouth every evening.     atorvastatin 40  MG tablet  Commonly known as:  LIPITOR  Take 40 mg by mouth every morning.     calcitRIOL 0.25 MCG capsule  Commonly known as:  ROCALTROL  Take 1 capsule (0.25 mcg total) by mouth daily.     calcium-vitamin D 500-200 MG-UNIT per tablet  Commonly known as:  OSCAL WITH D  Take 1 tablet by mouth 3 (three) times daily.     cholecalciferol 1000 UNITS tablet  Commonly known as:  VITAMIN D  Take 4 tablets (4,000 Units total) by mouth daily.     cloNIDine 0.1 MG tablet  Commonly known as:  CATAPRES  Take 0.1 mg by mouth 2 (two) times daily.     diphenoxylate-atropine 2.5-0.025 MG per tablet  Commonly known as:  LOMOTIL  Take 1 tablet by mouth 4 (four) times daily as needed for diarrhea or loose stools.     dronabinol 5 MG capsule  Commonly known as:  MARINOL  Take 1 capsule (5 mg total) by mouth 2 (two) times daily before lunch and supper.     DULoxetine 60 MG capsule  Commonly known as:  CYMBALTA  Take 1 capsule (60 mg total) by mouth daily.     famotidine 40 MG tablet  Commonly known as:  PEPCID  Take 1 tablet (40 mg total) by mouth 2 (two) times daily.     feeding supplement Liqd  Take 1 Container by mouth 4 (four) times daily -  with meals and at bedtime.     Ferrous sulfate 325 mg BID   gabapentin 300 MG capsule  Commonly known as:  NEURONTIN  Take 300 mg by mouth 3 (three) times daily.     glimepiride 1 MG tablet  Commonly known as:  AMARYL  Take 1 mg by mouth daily before breakfast.     labetalol 300 MG tablet  Commonly known as:  NORMODYNE  Take 300 mg by mouth 2 (two) times daily.     levofloxacin 750 MG tablet  Commonly known as:  LEVAQUIN  Take 1 tablet (750 mg total) by mouth every other day.     magnesium oxide 400 (241.3 MG) MG tablet  Commonly known as:  MAG-OX  Take 2 tablets (800 mg total) by mouth 3 (three) times daily.     multivitamin with minerals Tabs  Take 1 tablet by mouth daily.     nortriptyline 10 MG capsule  Commonly known as:   PAMELOR  Take 2 capsules (20 mg total) by mouth at bedtime.        oxyCODONE-acetaminophen 5-325 MG per tablet  Commonly known as:  PERCOCET/ROXICET  Take 1 tablet by mouth at bedtime.  promethazine 12.5 MG tablet  Commonly known as:  PHENERGAN  Take 12.5-25 mg by mouth every 8 (eight) hours as needed for nausea.     risperiDONE 0.5 MG tablet  Commonly known as:  RISPERDAL  Take 1 tablet (0.5 mg total) by mouth 2 (two) times daily.     sodium bicarbonate 650 MG tablet  Take 1,300 mg by mouth daily.     temazepam 15 MG capsule  Commonly known as:  RESTORIL  Take 15-30 mg by mouth at bedtime as needed for sleep.           Follow-up Information   Follow up with Sonda Primes, MD In 2 weeks.   Contact information:   [address] [place] [address] [phone]        The results of significant diagnostics from this hospitalization (including imaging, microbiology, ancillary and laboratory) are listed below for reference.    Significant Diagnostic Studies: Ct Abdomen Pelvis Wo Contrast  03/14/2013   *RADIOLOGY REPORT*  Clinical Data: Lung mass, abnormal LFTs  CT ABDOMEN AND PELVIS WITHOUT CONTRAST  Technique:  Multidetector CT imaging of the abdomen and pelvis was performed following the standard protocol without intravenous contrast.  Comparison: 12/03/2010  Findings: Lung bases are clear.  Small hiatal hernia.  Surgical clips near the GE junction.  Unenhanced liver, spleen, pancreas, and right adrenal gland are within normal limits.  Mild nodular thickening of the left adrenal gland.  Possible tiny layering gallstone (series 2/image 22).  No associated inflammatory changes.  Left renal atrophy. Dated bilateral renal cysts measuring up to 1.8 cm in the left lower pole.  No renal calculi or hydronephrosis.  No evidence of bowel obstruction.  Normal appendix.  4.5 x 4.9 cm infrarenal abdominal aortic aneurysm, previously measuring 3.8 x 3.8 cm,  with indwelling aortobi-iliac stent. Specifically, there is increased soft tissue along the left lateral aspect of the aneurysm sac (series 2/image 30).  Please note that an endoleak cannot be excluded on unenhanced CT.  No abdominopelvic ascites.  No suspicious abdominopelvic lymphadenopathy.  Prostate is unremarkable.  Bladder is within normal limits.  Mild degenerative changes of the visualized thoracolumbar spine.  IMPRESSION: No evidence of metastatic disease in the abdomen/pelvis on unenhanced CT. Surgically, the unenhanced liver is grossly unremarkable.  Suspected tiny layering gallstone.  No associated inflammatory changes.  Left renal atrophy.  Suspected bilateral renal cysts.  4.5 x 4.9 cm infrarenal abdominal aortic aneurysm, as described above, increased since 2012 with indwelling aortobi-iliac stent.   Original Report Authenticated By: Charline Bills, M.D.   Dg Chest 1 View  03/15/2013   *RADIOLOGY REPORT*  Clinical Data: Right upper lobe mass, post biopsy.  CHEST - 1 VIEW  Comparison: 03/13/2013  Findings: Right upper lobe mass is again noted.  No pneumothorax following biopsy.  Mild hyperinflation of the lungs and interstitial prominence.  Heart is normal size.  No effusions or acute bony abnormality.  IMPRESSION: Right upper lobe mass, COPD.  No pneumothorax following biopsy.   Original Report Authenticated By: Charlett Nose, M.D.   Dg Chest 2 View  03/22/2013   *RADIOLOGY REPORT*  Clinical Data: Fall.  Weakness.  CHEST - 2 VIEW  Comparison: 03/15/2013.  Findings: The cardiac silhouette, mediastinal and hilar contours are within normal limits and stable.  Persistent but smaller cavitary lesion in the right upper lobe.  Remote healed rib fractures noted but no acute bony findings.  IMPRESSION:  1.  Persistent  but smaller cavitary right upper lobe lung lesion. 2.  No new/acute pulmonary findings. 3.  Intact bony thorax.  Remote rib fractures are noted.   Original Report Authenticated By: Rudie Meyer, M.D.   Dg Chest 2 View  03/13/2013   *RADIOLOGY REPORT*  Clinical Data: Cough and fever  CHEST - 2 VIEW  Comparison: Chest radiograph 12/04/2010  Findings: Normal cardiac silhouette.  There is a rounded mass within the right upper lobe abutting the pleural surface measuring up to 7.5 cm.  Lungs are hyperinflated.  prominent nipple shadow noted on the left.  Aortic stent graft noted on the lateral projection in the infrarenal abdominal aorta.  IMPRESSION:  Peripheral mass in the right upper lobe is concerning for neoplasm versus pneumonia.  Recommend CT thorax with contrast for further evaluation.  These results will be called to the ordering clinician or representative by the Radiologist Assistant, and communication documented in the PACS Dashboard.   Original Report Authenticated By: Genevive Bi, M.D.   Ct Head Wo Contrast  03/22/2013   *RADIOLOGY REPORT*  Clinical Data:  Larey Seat.  Hit head.  CT HEAD WITHOUT CONTRAST CT CERVICAL SPINE WITHOUT CONTRAST  Technique:  Multidetector CT imaging of the head and cervical spine was performed following the standard protocol without intravenous contrast.  Multiplanar CT image reconstructions of the cervical spine were also generated.  Comparison:  03/17/2013.  CT HEAD  Findings: Stable atrophy and periventricular white matter disease. No acute intracranial findings or mass lesions.  The bony structures are intact.  No acute skull or facial bone fracture.  The paranasal sinuses are clear.  IMPRESSION:  1.  No acute cranial findings.  Stable atrophy and periventricular white matter disease. 2.  No acute skull fracture.  CT CERVICAL SPINE  Findings: The sagittal reformatted images demonstrate normal alignment of the cervical vertebral bodies.  Disc spaces and vertebral bodies are maintained.  No acute bony findings or abnormal prevertebral soft tissue swelling.  The facets are normally aligned.  No facet or laminar fractures are seen. No large disc protrusions.   The neural foramen are patent.  The skull base C1 and C1-C2 articulations are maintained.  The dens is normal.  There are scattered cervical lymph nodes.  The lung apices are clear.  IMPRESSION: Normal alignment and no acute fracture.   Original Report Authenticated By: Rudie Meyer, M.D.   Ct Head Wo Contrast  03/17/2013   *RADIOLOGY REPORT*  Clinical Data: Altered mental status, lethargic, lung mass.  CT HEAD WITHOUT CONTRAST  Technique:  Contiguous axial images were obtained from the base of the skull through the vertex without contrast.  Comparison: None.  Findings: There is no evidence for acute infarction, intracranial hemorrhage, mass lesion, hydrocephalus, or extra-axial fluid.  Mild atrophy.  Moderate chronic microvascular ischemic change. Calvarium intact.  Clear sinuses and mastoids.  Vascular calcification.  IMPRESSION: Atrophy and small vessel disease.  No visible intracranial mass lesion or stroke.  No osseous destructive changes are observed.   Original Report Authenticated By: Davonna Belling, M.D.   Ct Chest Wo Contrast  03/14/2013   *RADIOLOGY REPORT*  Clinical Data: Fever and weakness for 1 week.  Diagnosed with pneumonia 1 day ago.  CT CHEST WITHOUT CONTRAST  Technique:  Multidetector CT imaging of the chest was performed following the standard protocol without IV contrast.  Comparison: PA and lateral chest 12/04/2010 and 03/13/2013.  Findings: No pleural or pericardial effusion is identified.  There is no axillary, hilar or mediastinal lymphadenopathy.  Calcific coronary and aortic atherosclerosis is noted.  Heart size is upper normal.  The patient has a very small hiatal hernia.  There is a mass in the periphery of the posterior segment of the right upper lobe.  The lesion measures 5.4 cm cranial-caudal by 4.7 cm AP by 4.1 cm transverse.  There is an area of central necrosis within the lesion.  The mass is contiguous with the pleural of the right chest wall.  The lungs are otherwise  unremarkable.  Incidentally imaged upper abdomen demonstrates surgical clips in the central aspect of the upper abdomen as seen on prior study. Visualized intra-abdominal contents are otherwise unremarkable.  No focal bony abnormality is identified.  IMPRESSION:  1.  Right upper lobe mass contiguous with the pleura of the periphery of the posterior right upper lobe is highly suspicious for carcinoma.  No lymphadenopathy or other evidence of metastatic disease identified. 2.  Calcific coronary aortic atherosclerosis. 3.  Very small hiatal hernia.   Original Report Authenticated By: Holley Dexter, M.D.   Ct Cervical Spine Wo Contrast  03/22/2013   *RADIOLOGY REPORT*  Clinical Data:  Larey Seat.  Hit head.  CT HEAD WITHOUT CONTRAST CT CERVICAL SPINE WITHOUT CONTRAST  Technique:  Multidetector CT imaging of the head and cervical spine was performed following the standard protocol without intravenous contrast.  Multiplanar CT image reconstructions of the cervical spine were also generated.  Comparison:  03/17/2013.  CT HEAD  Findings: Stable atrophy and periventricular white matter disease. No acute intracranial findings or mass lesions.  The bony structures are intact.  No acute skull or facial bone fracture.  The paranasal sinuses are clear.  IMPRESSION:  1.  No acute cranial findings.  Stable atrophy and periventricular white matter disease. 2.  No acute skull fracture.  CT CERVICAL SPINE  Findings: The sagittal reformatted images demonstrate normal alignment of the cervical vertebral bodies.  Disc spaces and vertebral bodies are maintained.  No acute bony findings or abnormal prevertebral soft tissue swelling.  The facets are normally aligned.  No facet or laminar fractures are seen. No large disc protrusions.  The neural foramen are patent.  The skull base C1 and C1-C2 articulations are maintained.  The dens is normal.  There are scattered cervical lymph nodes.  The lung apices are clear.  IMPRESSION: Normal  alignment and no acute fracture.   Original Report Authenticated By: Rudie Meyer, M.D.   US Abdomen Complete  03/16/2013   *RADIOLOGY REPORT*  Clinical Data:  Abnormal LFTs, diabetes, hypertension, COPD, smoker, coronary artery disease post MI  ULTRASOUND ABDOMEN:  Technique:  Sonography of upper abdominal structures was performed.  Comparison:  CT abdomen and pelvis 03/14/2013  Gallbladder:  Questionable tiny 3 mm calcification versus air of the fact at gallbladder neck.  No gallbladder wall thickening, pericholecystic fluid or sonographic Murphy's sign.  Common bile duct:  5 mm diameter, normal.  Distal CBD obscured by bowel gas  Liver:  Normal appearance  IVC:  Inadequately visualized due to poor sound transmission and body habitus  Pancreas:  Obscured by bowel gas  Spleen:  Normal appearance, 4.7 cm length  Right kidney:  11.1 cm length.  Normal morphology without mass or hydronephrosis.  Left kidney:  8.4 cm length.  Cortical thinning.  Hypoechoic focus at inferior pole 2.0 x 1.8 1.8 cm likely small peripelvic cyst, mildly complicated.  No definite shadowing calculi or hydronephrosis.  Aorta:  Normal caliber  Other:  No free fluid  IMPRESSION: Questionable gallstone  versus artifact at gallbladder neck without evidence of acute cholecystitis or biliary obstruction. Mildly complicated peripelvic cyst inferior pole left kidney 2.0 cm greatest size.   Original Report Authenticated By: Ulyses Southward, M.D.   Ct Biopsy  03/15/2013   *RADIOLOGY REPORT*  Indication: Indeterminate right upper lobe partially cavitary pulmonary mass worrisome for bronchogenic carcinoma.  CT GUIDED RIGHT UPPER LOBE NODULE CORE NEEDLE BIOPSY  Comparisons: Chest CT - 03/14/2013; CT abdomen pelvis - 03/14/2013  Intravenous medications: Fentanyl 50 mcg IV; Versed 1 mg IV  Contrast: None  Sedation time: 18 minutes  Complications: None immediate  TECHNIQUE/FINDINGS:  Informed consent was obtained from the patient following an explanation of  the procedure, risks, benefits and alternatives. The patient understands, agrees and consents for the procedure. All questions were addressed.  A time out was performed prior to the initiation of the procedure.  The patient was positioned in varying degrees of supine slightly LPO on the CT table and a limited chest CT was performed for procedural planning demonstrating the grossly unchanged approximate 4.1 x 4.9 cm partially cavitary mass within the right upper lobe with associated adjacent ground-glass atelectasis (image 10, series 2).  The operative site was prepped and draped in the usual sterile fashion.  Under sterile conditions and local anesthesia, a 17 gauge coaxial needle was advanced into the peripheral aspect of the mass. Positioning was confirmed with intermittent CT fluoroscopy followed by the acquisition of 3 with an 18 gauge core needle biopsy device.  Limited post procedural chest CT was negative for pneumothorax or additional complication.  The co-axial needle was removed and hemostasis was achieved with manual compression.  A dressing was placed.  The patient tolerated the procedure well without immediate postprocedural complication.  The patient was escorted to have an upright chest radiograph.  IMPRESSION:  Technically successful CT guided core needle biopsy of indeterminate right upper lobe pulmonary mass.   Original Report Authenticated By: Tacey Ruiz, MD   Dg Foot Complete Right  03/16/2013   *RADIOLOGY REPORT*  Clinical Data: Right great toe pain.  Rule out fracture  RIGHT FOOT COMPLETE - 3+ VIEW  Comparison: None  Findings: Chronic healed fracture of the second metatarsal in the mid shaft.  No acute fracture.  Negative for arthropathy.  IMPRESSION: Chronic healed fracture second metatarsal.  No acute fracture.   Original Report Authenticated By: Janeece Riggers, M.D.    Microbiology: No results found for this or any previous visit (from the past 240 hour(s)).   Labs: Basic Metabolic  Panel:  Recent Labs Lab 03/25/13 0359 03/26/13 0434 03/27/13 0404 03/28/13 0411 03/29/13 0355  NA 140 143 140 139 138  K 3.7 3.9 3.7 3.4* 3.3*  CL 105 110 105 105 102  CO2 20 22 24 26 26   GLUCOSE 277* 163* 122* 100* 113*  BUN 27* 23 25* 24* 23  CREATININE 2.13* 1.80* 1.61* 1.65* 1.71*  CALCIUM 6.0* 6.4* 7.3* 7.5* 8.3*  MG 1.5 1.2* 1.8 1.3* 2.0  PHOS 3.4 2.6 2.4 2.9 3.4   Liver Function Tests:  Recent Labs Lab 03/22/13 2215 03/23/13 0743  03/25/13 0359 03/26/13 0434 03/27/13 0404 03/28/13 0411 03/29/13 0355  AST 34 29  --   --   --   --   --   --   ALT 64* 50  --   --   --   --   --   --   ALKPHOS 166* 143*  --   --   --   --   --   --  BILITOT 0.3 0.2*  --   --   --   --   --   --   PROT 7.3 6.4  --   --   --   --   --   --   ALBUMIN 2.7* 2.3*  < > 2.1* 2.0* 2.1* 1.9* 2.0*  < > = values in this interval not displayed. No results found for this basename: LIPASE, AMYLASE,  in the last 168 hours No results found for this basename: AMMONIA,  in the last 168 hours CBC:  Recent Labs Lab 03/22/13 2215 03/27/13 0404 03/28/13 0411  WBC 19.6* 15.2* 12.3*  NEUTROABS 16.6*  --   --   HGB 9.5* 7.6* 8.1*  HCT 29.8* 25.2* 26.0*  MCV 92.0 92.6 93.2  PLT 324 281 307   Cardiac Enzymes:  Recent Labs Lab 03/23/13 0743  CKTOTAL 146   BNP: BNP (last 3 results) No results found for this basename: PROBNP,  in the last 8760 hours CBG:  Recent Labs Lab 03/28/13 1222 03/28/13 1648 03/28/13 2117 03/29/13 0716 03/29/13 1121  GLUCAP 109* 103* 126* 86 143*    Time coordinating discharge: Over 30 minutes  Signed:  Manson Passey, MD  [facility]  03/29/2013, 12:32 PM  Pager #: [phone]

## 2013-03-29 NOTE — Progress Notes (Signed)
ANTIBIOTIC CONSULT NOTE - INITIAL  Pharmacy Consult for Levaquin Indication: Empiric  Allergies  Allergen Reactions  . Tramadol Nausea And Vomiting  . Amlodipine Besylate     REACTION: swelling  . Benazepril Hcl     REACTION: elev K  . Penicillins     REACTION: rash, itching  . Risperidone     REACTION: loss of motor skills  . Valsartan     REACTION: elev K    Patient Measurements: Height: 5\' 9"  (175.3 cm) Weight: 146 lb 9.7 oz (66.5 kg) IBW/kg (Calculated) : 70.7  Vital Signs: Temp: 98.1 F (36.7 C) (05/28 0500) Temp src: Oral (05/28 0500) BP: 167/64 mmHg (05/28 0500) Pulse Rate: 69 (05/28 0500)  Labs:  Recent Labs  03/27/13 0404 03/28/13 0411 03/29/13 0355  WBC 15.2* 12.3*  --   HGB 7.6* 8.1*  --   PLT 281 307  --   CREATININE 1.61* 1.65* 1.71*   Estimated Creatinine Clearance: 37.3 ml/min (by C-G formula based on Cr of 1.71).    Microbiology: Recent Results (from the past 720 hour(s))  CULTURE, BLOOD (ROUTINE X 2)     Status: None   Collection Time    03/14/13  2:55 PM      Result Value Range Status   Specimen Description BLOOD LEFT ARM   Final   Special Requests BOTTLES DRAWN AEROBIC AND ANAEROBIC LEFT HAND   Final   Culture  Setup Time 03/14/2013 22:48   Final   Culture NO GROWTH 5 DAYS   Final   Report Status 03/20/2013 FINAL   Final  CULTURE, BLOOD (ROUTINE X 2)     Status: None   Collection Time    03/14/13  3:08 PM      Result Value Range Status   Specimen Description BLOOD LEFT ARM   Final   Special Requests BOTTLES DRAWN AEROBIC AND ANAEROBIC 10CC   Final   Culture  Setup Time 03/14/2013 22:48   Final   Culture NO GROWTH 5 DAYS   Final   Report Status 03/20/2013 FINAL   Final  CLOSTRIDIUM DIFFICILE BY PCR     Status: None   Collection Time    03/15/13  4:49 PM      Result Value Range Status   C difficile by pcr NEGATIVE  NEGATIVE Final    Medical History: Past Medical History  Diagnosis Date  . Diabetes mellitus   . Hypertension    . Anxiety   . COPD (chronic obstructive pulmonary disease)   . Hyperlipemia   . GERD (gastroesophageal reflux disease)   . Barrett's esophagus with high grade dysplasia     s/p RFA  . PVD (peripheral vascular disease)   . CAD (coronary artery disease)   . Tobacco user   . Depression   . Hiatal hernia   . Myocardial infarction 12/1996  . AAA (abdominal aortic aneurysm)   . Renal insufficiency 2010    Medications:  Anti-infectives   Start     Dose/Rate Route Frequency Ordered Stop   03/26/13 1200  levofloxacin (LEVAQUIN) IVPB 750 mg     750 mg 100 mL/hr over 90 Minutes Intravenous Every 48 hours 03/26/13 0949     03/26/13 0930  levofloxacin (LEVAQUIN) IVPB 750 mg  Status:  Discontinued     750 mg 100 mL/hr over 90 Minutes Intravenous Every 24 hours 03/26/13 0923 03/26/13 0924   03/23/13 1000  levofloxacin (LEVAQUIN) tablet 500 mg  Status:  Discontinued     500  mg Oral Every 48 hours 03/23/13 0155 03/23/13 1100     Assessment: 72 yo M with hx of CKD III-IV admitted 03/23/13 for evaluation of weakness and fall. Was previously on empiric Levaquin 500mg  po Q48h as outpatinet for right lung mass (CT guided biopsy on 03/15/13 non-conclusive) r/o lung infection, but was stopped on 03/21/13 due to problems with tremors/anorexia. This is now thought to be due to hypomagnesemia not Levaquin, 24 hour urine collection shows Mag wasting. Given recent hospitalization and several interruptions in therapy, patient has been placed on HCAP dosing. Wt=66.5kg, CrCl~21ml/min.   Patient remains afebrile  WBC decreased to 12.3 on 5/27, no new CBC  SCr essentially stable at 1.71, this is below patient's baseline of ~2.4  Mag at 2.0 this AM, remains on daily oral supplementation, received 4gm IV bolus 5/27  Patient on famotidine 40mg  BID secondary to Barrett's esophagus that has necessitated long-term, high-dose PPI therapy which may increase risk for hypomagnesemia. Under normal circumstances,  famotidine should be dosed daily in renal dysfunction, however higher doses may be appropriate in Barrett's esophagus for adequate symptom control. Will f/u electrolytes daily to decrease risk of arrhythmias while patient is also on Levaquin and prn Haldol  Plan:   Continue Levaquin 750mg  q48h  Change Levaquin to PO formulation  F/u electrolytes, clinical course, discharge planning  Thank you for the consult.  Tomi Bamberger, PharmD Pager: [phone] 03/29/2013 8:19 AM

## 2013-03-29 NOTE — Progress Notes (Signed)
Pt d/c'd to [facility]. Report given to nurse. Pt assessed for pain, which he denied. Peripheral IV removed. Pt in no distress and ready to be d/c'd.

## 2013-03-30 LAB — VITAMIN D 1,25 DIHYDROXY: Vitamin D 1, 25 (OH)2 Total: 59 pg/mL (ref 18–72)

## 2013-03-31 ENCOUNTER — Encounter: Payer: Self-pay | Admitting: Internal Medicine

## 2013-04-07 ENCOUNTER — Emergency Department (HOSPITAL_COMMUNITY): Payer: Medicare Other

## 2013-04-07 ENCOUNTER — Inpatient Hospital Stay (HOSPITAL_COMMUNITY)
Admission: EM | Admit: 2013-04-07 | Discharge: 2013-04-10 | DRG: 312 | Disposition: A | Discharge status: Skilled Nursing Facility | Payer: Medicare Other | Attending: Internal Medicine | Admitting: Internal Medicine

## 2013-04-07 ENCOUNTER — Encounter (HOSPITAL_COMMUNITY): Payer: Self-pay | Admitting: Emergency Medicine

## 2013-04-07 DIAGNOSIS — R110 Nausea: Secondary | ICD-10-CM

## 2013-04-07 DIAGNOSIS — N189 Chronic kidney disease, unspecified: Secondary | ICD-10-CM | POA: Diagnosis present

## 2013-04-07 DIAGNOSIS — R7401 Elevation of levels of liver transaminase levels: Secondary | ICD-10-CM

## 2013-04-07 DIAGNOSIS — R918 Other nonspecific abnormal finding of lung field: Secondary | ICD-10-CM

## 2013-04-07 DIAGNOSIS — J4489 Other specified chronic obstructive pulmonary disease: Secondary | ICD-10-CM

## 2013-04-07 DIAGNOSIS — R410 Disorientation, unspecified: Secondary | ICD-10-CM

## 2013-04-07 DIAGNOSIS — K52832 Lymphocytic colitis: Secondary | ICD-10-CM

## 2013-04-07 DIAGNOSIS — E872 Acidosis, unspecified: Secondary | ICD-10-CM

## 2013-04-07 DIAGNOSIS — I798 Other disorders of arteries, arterioles and capillaries in diseases classified elsewhere: Secondary | ICD-10-CM | POA: Diagnosis present

## 2013-04-07 DIAGNOSIS — R55 Syncope and collapse: Secondary | ICD-10-CM

## 2013-04-07 DIAGNOSIS — N259 Disorder resulting from impaired renal tubular function, unspecified: Secondary | ICD-10-CM

## 2013-04-07 DIAGNOSIS — L570 Actinic keratosis: Secondary | ICD-10-CM

## 2013-04-07 DIAGNOSIS — K22711 Barrett's esophagus with high grade dysplasia: Secondary | ICD-10-CM

## 2013-04-07 DIAGNOSIS — J449 Chronic obstructive pulmonary disease, unspecified: Secondary | ICD-10-CM | POA: Diagnosis present

## 2013-04-07 DIAGNOSIS — G4700 Insomnia, unspecified: Secondary | ICD-10-CM

## 2013-04-07 DIAGNOSIS — E875 Hyperkalemia: Secondary | ICD-10-CM

## 2013-04-07 DIAGNOSIS — R634 Abnormal weight loss: Secondary | ICD-10-CM

## 2013-04-07 DIAGNOSIS — R5381 Other malaise: Secondary | ICD-10-CM | POA: Diagnosis present

## 2013-04-07 DIAGNOSIS — F329 Major depressive disorder, single episode, unspecified: Secondary | ICD-10-CM

## 2013-04-07 DIAGNOSIS — I129 Hypertensive chronic kidney disease with stage 1 through stage 4 chronic kidney disease, or unspecified chronic kidney disease: Secondary | ICD-10-CM

## 2013-04-07 DIAGNOSIS — Z66 Do not resuscitate: Secondary | ICD-10-CM | POA: Diagnosis present

## 2013-04-07 DIAGNOSIS — F411 Generalized anxiety disorder: Secondary | ICD-10-CM

## 2013-04-07 DIAGNOSIS — R296 Repeated falls: Secondary | ICD-10-CM

## 2013-04-07 DIAGNOSIS — N184 Chronic kidney disease, stage 4 (severe): Secondary | ICD-10-CM | POA: Diagnosis present

## 2013-04-07 DIAGNOSIS — N183 Chronic kidney disease, stage 3 unspecified: Secondary | ICD-10-CM

## 2013-04-07 DIAGNOSIS — J439 Emphysema, unspecified: Secondary | ICD-10-CM | POA: Diagnosis present

## 2013-04-07 DIAGNOSIS — R509 Fever, unspecified: Secondary | ICD-10-CM

## 2013-04-07 DIAGNOSIS — N179 Acute kidney failure, unspecified: Secondary | ICD-10-CM

## 2013-04-07 DIAGNOSIS — I951 Orthostatic hypotension: Principal | ICD-10-CM

## 2013-04-07 DIAGNOSIS — I714 Abdominal aortic aneurysm, without rupture, unspecified: Secondary | ICD-10-CM

## 2013-04-07 DIAGNOSIS — R259 Unspecified abnormal involuntary movements: Secondary | ICD-10-CM | POA: Diagnosis present

## 2013-04-07 DIAGNOSIS — E43 Unspecified severe protein-calorie malnutrition: Secondary | ICD-10-CM

## 2013-04-07 DIAGNOSIS — Z9181 History of falling: Secondary | ICD-10-CM

## 2013-04-07 DIAGNOSIS — E1159 Type 2 diabetes mellitus with other circulatory complications: Secondary | ICD-10-CM | POA: Diagnosis present

## 2013-04-07 DIAGNOSIS — J189 Pneumonia, unspecified organism: Secondary | ICD-10-CM

## 2013-04-07 DIAGNOSIS — R251 Tremor, unspecified: Secondary | ICD-10-CM

## 2013-04-07 DIAGNOSIS — E1151 Type 2 diabetes mellitus with diabetic peripheral angiopathy without gangrene: Secondary | ICD-10-CM | POA: Diagnosis present

## 2013-04-07 DIAGNOSIS — D509 Iron deficiency anemia, unspecified: Secondary | ICD-10-CM | POA: Diagnosis present

## 2013-04-07 DIAGNOSIS — D649 Anemia, unspecified: Secondary | ICD-10-CM

## 2013-04-07 DIAGNOSIS — E860 Dehydration: Secondary | ICD-10-CM

## 2013-04-07 DIAGNOSIS — F3289 Other specified depressive episodes: Secondary | ICD-10-CM

## 2013-04-07 DIAGNOSIS — R627 Adult failure to thrive: Secondary | ICD-10-CM

## 2013-04-07 DIAGNOSIS — D72829 Elevated white blood cell count, unspecified: Secondary | ICD-10-CM

## 2013-04-07 DIAGNOSIS — E785 Hyperlipidemia, unspecified: Secondary | ICD-10-CM

## 2013-04-07 DIAGNOSIS — E876 Hypokalemia: Secondary | ICD-10-CM

## 2013-04-07 DIAGNOSIS — I2510 Atherosclerotic heart disease of native coronary artery without angina pectoris: Secondary | ICD-10-CM

## 2013-04-07 DIAGNOSIS — R222 Localized swelling, mass and lump, trunk: Secondary | ICD-10-CM | POA: Diagnosis present

## 2013-04-07 DIAGNOSIS — K219 Gastro-esophageal reflux disease without esophagitis: Secondary | ICD-10-CM

## 2013-04-07 DIAGNOSIS — F17200 Nicotine dependence, unspecified, uncomplicated: Secondary | ICD-10-CM

## 2013-04-07 DIAGNOSIS — J441 Chronic obstructive pulmonary disease with (acute) exacerbation: Secondary | ICD-10-CM

## 2013-04-07 DIAGNOSIS — I739 Peripheral vascular disease, unspecified: Secondary | ICD-10-CM

## 2013-04-07 DIAGNOSIS — D485 Neoplasm of uncertain behavior of skin: Secondary | ICD-10-CM

## 2013-04-07 DIAGNOSIS — K529 Noninfective gastroenteritis and colitis, unspecified: Secondary | ICD-10-CM

## 2013-04-07 DIAGNOSIS — T50995A Adverse effect of other drugs, medicaments and biological substances, initial encounter: Secondary | ICD-10-CM | POA: Diagnosis present

## 2013-04-07 DIAGNOSIS — E162 Hypoglycemia, unspecified: Secondary | ICD-10-CM

## 2013-04-07 LAB — CBC WITH DIFFERENTIAL/PLATELET
Basophils Absolute: 0.1 10*3/uL (ref 0.0–0.1)
Eosinophils Relative: 1 % (ref 0–5)
HCT: 27.2 % — ABNORMAL LOW (ref 39.0–52.0)
Lymphocytes Relative: 13 % (ref 12–46)
Lymphs Abs: 2.3 10*3/uL (ref 0.7–4.0)
MCV: 93.2 fL (ref 78.0–100.0)
Monocytes Absolute: 1.2 10*3/uL — ABNORMAL HIGH (ref 0.1–1.0)
Neutro Abs: 15.1 10*3/uL — ABNORMAL HIGH (ref 1.7–7.7)
Platelets: 370 10*3/uL (ref 150–400)
RBC: 2.92 MIL/uL — ABNORMAL LOW (ref 4.22–5.81)
RDW: 14.1 % (ref 11.5–15.5)
WBC: 18.8 10*3/uL — ABNORMAL HIGH (ref 4.0–10.5)

## 2013-04-07 LAB — URINALYSIS, ROUTINE W REFLEX MICROSCOPIC
Bilirubin Urine: NEGATIVE
Hgb urine dipstick: NEGATIVE
Ketones, ur: NEGATIVE mg/dL
Nitrite: NEGATIVE
Protein, ur: NEGATIVE mg/dL
Urobilinogen, UA: 0.2 mg/dL (ref 0.0–1.0)

## 2013-04-07 LAB — COMPREHENSIVE METABOLIC PANEL
ALT: 47 U/L (ref 0–53)
AST: 28 U/L (ref 0–37)
Alkaline Phosphatase: 245 U/L — ABNORMAL HIGH (ref 39–117)
CO2: 27 mEq/L (ref 19–32)
Calcium: 8.9 mg/dL (ref 8.4–10.5)
Chloride: 95 mEq/L — ABNORMAL LOW (ref 96–112)
GFR calc Af Amer: 25 mL/min — ABNORMAL LOW (ref >90)
GFR calc non Af Amer: 21 mL/min — ABNORMAL LOW (ref >90)
Glucose, Bld: 105 mg/dL — ABNORMAL HIGH (ref 70–99)
Sodium: 135 mEq/L (ref 135–145)
Total Bilirubin: 0.2 mg/dL — ABNORMAL LOW (ref 0.3–1.2)

## 2013-04-07 LAB — CG4 I-STAT (LACTIC ACID): Lactic Acid, Venous: 1.26 mmol/L (ref 0.5–2.2)

## 2013-04-07 MED ORDER — ONDANSETRON HCL 4 MG/2ML IJ SOLN
4.0000 mg | Freq: Four times a day (QID) | INTRAMUSCULAR | Status: DC | PRN
  Administered 2013-04-10: 4 mg via INTRAVENOUS
  Filled 2013-04-07: qty 2

## 2013-04-07 MED ORDER — FAMOTIDINE 40 MG PO TABS
40.0000 mg | ORAL_TABLET | Freq: Two times a day (BID) | ORAL | Status: DC
  Administered 2013-04-08 – 2013-04-10 (×6): 40 mg via ORAL
  Filled 2013-04-07 (×7): qty 1

## 2013-04-07 MED ORDER — DIPHENOXYLATE-ATROPINE 2.5-0.025 MG PO TABS: 1.0000 | ORAL_TABLET | Freq: Four times a day (QID) | ORAL | Status: DC | PRN

## 2013-04-07 MED ORDER — SODIUM CHLORIDE 0.9 % IV BOLUS (SEPSIS)
1000.0000 mL | Freq: Once | INTRAVENOUS | Status: AC
  Administered 2013-04-07: 1000 mL via INTRAVENOUS

## 2013-04-07 MED ORDER — PROMETHAZINE HCL 25 MG PO TABS: 12.5000 mg | ORAL_TABLET | Freq: Three times a day (TID) | ORAL | Status: DC | PRN

## 2013-04-07 MED ORDER — MAGNESIUM OXIDE 400 (241.3 MG) MG PO TABS
800.0000 mg | ORAL_TABLET | Freq: Three times a day (TID) | ORAL | Status: DC
  Administered 2013-04-07 – 2013-04-10 (×9): 800 mg via ORAL
  Filled 2013-04-07 (×11): qty 2

## 2013-04-07 MED ORDER — CLONIDINE HCL 0.1 MG PO TABS
0.1000 mg | ORAL_TABLET | Freq: Two times a day (BID) | ORAL | Status: DC
  Administered 2013-04-08 (×2): 0.1 mg via ORAL
  Filled 2013-04-07 (×3): qty 1

## 2013-04-07 MED ORDER — LABETALOL HCL 300 MG PO TABS
300.0000 mg | ORAL_TABLET | Freq: Two times a day (BID) | ORAL | Status: DC
  Administered 2013-04-07 – 2013-04-08 (×2): 300 mg via ORAL
  Filled 2013-04-07 (×3): qty 1

## 2013-04-07 MED ORDER — CALCIUM CARBONATE-VITAMIN D 500-200 MG-UNIT PO TABS
1.0000 | ORAL_TABLET | Freq: Three times a day (TID) | ORAL | Status: DC
  Administered 2013-04-07 – 2013-04-10 (×9): 1 via ORAL
  Filled 2013-04-07 (×10): qty 1

## 2013-04-07 MED ORDER — ASPIRIN EC 81 MG PO TBEC
81.0000 mg | DELAYED_RELEASE_TABLET | Freq: Every evening | ORAL | Status: DC
  Administered 2013-04-08 – 2013-04-09 (×2): 81 mg via ORAL
  Filled 2013-04-07 (×3): qty 1

## 2013-04-07 MED ORDER — SODIUM CHLORIDE 0.9 % IV SOLN: INTRAVENOUS | Status: DC

## 2013-04-07 MED ORDER — NORTRIPTYLINE HCL 10 MG PO CAPS
20.0000 mg | ORAL_CAPSULE | Freq: Every day | ORAL | Status: DC
  Administered 2013-04-07: 20 mg via ORAL
  Filled 2013-04-07 (×2): qty 2

## 2013-04-07 MED ORDER — CALCITRIOL 0.25 MCG PO CAPS
0.2500 ug | ORAL_CAPSULE | Freq: Every day | ORAL | Status: DC
  Administered 2013-04-08 – 2013-04-10 (×3): 0.25 ug via ORAL
  Filled 2013-04-07 (×3): qty 1

## 2013-04-07 MED ORDER — LEVOFLOXACIN 500 MG PO TABS: 750.0000 mg | ORAL_TABLET | ORAL | Status: DC

## 2013-04-07 MED ORDER — POLYSACCHARIDE IRON COMPLEX 150 MG PO CAPS
150.0000 mg | ORAL_CAPSULE | Freq: Two times a day (BID) | ORAL | Status: DC
  Administered 2013-04-07 – 2013-04-10 (×6): 150 mg via ORAL
  Filled 2013-04-07 (×7): qty 1

## 2013-04-07 MED ORDER — ADULT MULTIVITAMIN W/MINERALS CH
1.0000 | ORAL_TABLET | Freq: Every day | ORAL | Status: DC
  Administered 2013-04-08 – 2013-04-10 (×3): 1 via ORAL
  Filled 2013-04-07 (×3): qty 1

## 2013-04-07 MED ORDER — VITAMIN D3 25 MCG (1000 UNIT) PO TABS
4000.0000 [IU] | ORAL_TABLET | Freq: Every day | ORAL | Status: DC
  Administered 2013-04-08 – 2013-04-10 (×3): 4000 [IU] via ORAL
  Filled 2013-04-07 (×3): qty 4

## 2013-04-07 MED ORDER — SODIUM BICARBONATE 650 MG PO TABS
1300.0000 mg | ORAL_TABLET | Freq: Every day | ORAL | Status: DC
  Administered 2013-04-08 – 2013-04-10 (×3): 1300 mg via ORAL
  Filled 2013-04-07 (×3): qty 2

## 2013-04-07 MED ORDER — SODIUM CHLORIDE 0.9 % IV SOLN
INTRAVENOUS | Status: DC
  Administered 2013-04-07 – 2013-04-08 (×2): via INTRAVENOUS

## 2013-04-07 MED ORDER — ENOXAPARIN SODIUM 30 MG/0.3ML ~~LOC~~ SOLN
30.0000 mg | Freq: Every day | SUBCUTANEOUS | Status: DC
  Administered 2013-04-07 – 2013-04-08 (×2): 30 mg via SUBCUTANEOUS
  Filled 2013-04-07 (×3): qty 0.3

## 2013-04-07 MED ORDER — BOOST / RESOURCE BREEZE PO LIQD
1.0000 | Freq: Three times a day (TID) | ORAL | Status: DC
  Administered 2013-04-08 – 2013-04-10 (×9): 1 via ORAL
  Filled 2013-04-07: qty 1

## 2013-04-07 MED ORDER — ATORVASTATIN CALCIUM 40 MG PO TABS
40.0000 mg | ORAL_TABLET | Freq: Every day | ORAL | Status: DC
  Administered 2013-04-08 – 2013-04-09 (×2): 40 mg via ORAL
  Filled 2013-04-07 (×3): qty 1

## 2013-04-07 MED ORDER — OXYCODONE-ACETAMINOPHEN 5-325 MG PO TABS
1.0000 | ORAL_TABLET | Freq: Every day | ORAL | Status: DC
  Administered 2013-04-08 – 2013-04-09 (×3): 1 via ORAL
  Filled 2013-04-07 (×2): qty 1
  Filled 2013-04-07: qty 2
  Filled 2013-04-07: qty 1

## 2013-04-07 MED ORDER — ALPRAZOLAM 0.5 MG PO TABS
0.5000 mg | ORAL_TABLET | Freq: Two times a day (BID) | ORAL | Status: DC | PRN
  Administered 2013-04-08: 0.5 mg via ORAL
  Filled 2013-04-07: qty 1

## 2013-04-07 MED ORDER — DRONABINOL 5 MG PO CAPS
5.0000 mg | ORAL_CAPSULE | Freq: Two times a day (BID) | ORAL | Status: DC
  Administered 2013-04-08: 5 mg via ORAL
  Filled 2013-04-07: qty 1

## 2013-04-07 MED ORDER — GABAPENTIN 300 MG PO CAPS
300.0000 mg | ORAL_CAPSULE | Freq: Two times a day (BID) | ORAL | Status: DC
  Administered 2013-04-07 – 2013-04-08 (×2): 300 mg via ORAL
  Filled 2013-04-07 (×3): qty 1

## 2013-04-07 MED ORDER — DULOXETINE HCL 60 MG PO CPEP
60.0000 mg | ORAL_CAPSULE | Freq: Every day | ORAL | Status: DC
  Administered 2013-04-08 – 2013-04-10 (×3): 60 mg via ORAL
  Filled 2013-04-07 (×3): qty 1

## 2013-04-07 MED ORDER — ONDANSETRON HCL 4 MG PO TABS: 4.0000 mg | ORAL_TABLET | Freq: Four times a day (QID) | ORAL | Status: DC | PRN

## 2013-04-07 NOTE — H&P (Signed)
[facility] History and Physical  [name] WUJ:[id number] DOB: Jun 17, 1941 DOA: 04/07/2013  Referring physician: ED physician PCP: Sonda Primes, MD   Chief Complaint: Generalized weakness  HPI:  Patient is a 72 year old male with history of hypertension, diabetes, COPD, CAD, AAA, renal insufficiency and right lung mass who presents to [facility] emergency department with main concern of generalized weakness and frequent falls at home. Patient was recently hospitalized for symptom or concern. Patient explains that he has persistent tremors in lower extremities and unable to support his weight. In addition he expresses poor appetite, possible weight loss but he is unsure of exact amount. CT of the right upper lobe mass was negative for malignancy. Patient denies chest pain or shortness of breath, no specific abdominal or urinary concerns, he denies any specific focal neurological deficits.  His recent evaluation includes MRI brain, CT brain, CT chest abdomen pelvis. Imaging tests were not indicative of any acute pathology that could explain frequent falls and disequilibrium, CT of the abdomen and pelvis with no signs of metastatic process. In emergency department, patient noted to have tremors, in addition significant leukocytosis > 18,000, acute on chronic renal failure with worsening creatinine 2.79. [facility] asked to admit for further evaluation.  Assessment and Plan:  Active Problems:   Falls, generalized weakness - secondary to progressive deconditioning and failure to thrive, over medicated, pt on xanax, percocet, neurontin, cymbalta, marinol, phenergan, nortryptilline, all of which can affect balance and unsteady gait, pt is not even able to tell you all the medications he is on and will need to discuss this with his wife as approach towards more simple regimen may be beneficial in overall health and physical conditioning  - discussed with pt potential need for SNF, wife not at  bedside currently so he will need to discuss with her - PT inpatient, gentle hydration with NS   Renal failure (ARF), acute on chronic - secondary to pre renal etiology imposed on chronic renal failure stage III - IVF as noted above - I have encouraged PO intake, BMP in AM   Leukocytosis - unclear etiology, possibly reactive, unchanged CXR from recent one, UA unremarkable - CBC in AM   Protein-calorie malnutrition, severe - in the setting of progressive deconditioning - continue nutritional supplementation    COPD - this appears to be clinically stable at this time - monitor vitals per floor protocol   DM (diabetes mellitus), type 2 with peripheral vascular complications - I do not see any diabetic medications on his profile, CBG in ED in 100's - will check A1C, this may as well be diet controlled    Right Lung mass - unchanged based on recent CXR, path negative   Normocytic anemia - Hg and Hct at pt's baseline - CBC in AM  Code Status: DNI/DNR Family Communication: Pt at bedside Disposition Plan: PT evaluation prior to discharge  Review of Systems:  Unable to provide at this time as he is frequently falling asleep during my interview    Past Medical History  Diagnosis Date  . Diabetes mellitus   . Hypertension   . Anxiety   . COPD (chronic obstructive pulmonary disease)   . Hyperlipemia   . GERD (gastroesophageal reflux disease)   . Barrett's esophagus with high grade dysplasia     s/p RFA  . PVD (peripheral vascular disease)   . CAD (coronary artery disease)   . Tobacco user   . Depression   . Hiatal hernia   . Myocardial  infarction 12/1996  . AAA (abdominal aortic aneurysm)   . Renal insufficiency 2010    Past Surgical History  Procedure Laterality Date  . Tonsillectomy    . Nissen fundoplication    . Abdominal aortic aneurysm repair  2010    and right common iliac artery aneurysm, DR HAYES  . Upper gastrointestinal endoscopy  07/07/2011    barretts  esophagus, hiatal hernia, gastritis, ablations in esophagus  . Colonoscopy  05/07/2005    diverticulosis, internal and external hemorrhoids  . Abdominal aortic aneurysm repair    . Radiofrequency ablation  multiple    Barrett's, high-grade dysplasia  . Colonoscopy N/A 03/01/2013    Procedure: COLONOSCOPY;  Surgeon: Iva Boop, MD;  Location: [facility] ENDOS[facility];  Service: Endoscopy;  Laterality: N/A;    Social History:  reports that he has been smoking Cigarettes.  He has a 54 pack-year smoking history. He has never used smokeless tobacco. He reports that he does not drink alcohol or use illicit drugs.  Allergies  Allergen Reactions  . Tramadol Nausea And Vomiting  . Amlodipine Besylate     REACTION: swelling  . Benazepril Hcl     REACTION: elev K  . Penicillins     REACTION: rash, itching  . Risperidone     REACTION: loss of motor skills  . Valsartan     REACTION: elev K    Family History  Problem Relation Age of Onset  . Coronary artery disease Other   . Diabetes Other   . Asthma Other   . Mental illness Mother     dementia  . Cancer Mother   . Diabetes Mother   . Heart disease Father   . Diabetes Father   . Stomach cancer Father   . Cancer Father   . Colon cancer Neg Hx   . Esophageal cancer Neg Hx     Prior to Admission medications   Medication Sig Start Date End Date Taking? Authorizing Provider  ALPRAZolam Prudy Feeler) 0.5 MG tablet Take 1 tablet (0.5 mg total) by mouth 2 (two) times daily as needed for sleep or anxiety. 03/29/13  Yes Alison Murray, MD  aspirin EC 81 MG tablet Take 81 mg by mouth every evening.   Yes Historical Provider, MD  atorvastatin (LIPITOR) 40 MG tablet Take 40 mg by mouth every morning.   Yes Historical Provider, MD  calcitRIOL (ROCALTROL) 0.25 MCG capsule Take 1 capsule (0.25 mcg total) by mouth daily. 03/29/13  Yes Alison Murray, MD  calcium-vitamin D (OSCAL WITH D) 500-200 MG-UNIT per tablet Take 1 tablet by mouth 3 (three) times daily. 03/29/13   Yes Alison Murray, MD  cholecalciferol (VITAMIN D) 1000 UNITS tablet Take 4 tablets (4,000 Units total) by mouth daily. 03/29/13  Yes Alison Murray, MD  cloNIDine (CATAPRES) 0.1 MG tablet Take 0.1 mg by mouth 2 (two) times daily.   Yes Historical Provider, MD  diphenoxylate-atropine (LOMOTIL) 2.5-0.025 MG per tablet Take 1 tablet by mouth 4 (four) times daily as needed for diarrhea or loose stools. 02/02/13  Yes Georgina Quint Plotnikov, MD  dronabinol (MARINOL) 5 MG capsule Take 1 capsule (5 mg total) by mouth 2 (two) times daily before lunch and supper. 03/29/13  Yes Alison Murray, MD  DULoxetine (CYMBALTA) 60 MG capsule Take 1 capsule (60 mg total) by mouth daily. 03/29/13  Yes Alison Murray, MD  famotidine (PEPCID) 40 MG tablet Take 1 tablet (40 mg total) by mouth 2 (two) times daily. 03/29/13  Yes Alison Murray, MD  feeding supplement (RESOURCE BREEZE) LIQD Take 1 Container by mouth 4 (four) times daily -  with meals and at bedtime. 03/29/13  Yes Alison Murray, MD  gabapentin (NEURONTIN) 300 MG capsule Take 300 mg by mouth 2 (two) times daily.    Yes Historical Provider, MD  iron polysaccharides (NIFEREX) 150 MG capsule Take 150 mg by mouth 2 (two) times daily.   Yes Historical Provider, MD  labetalol (NORMODYNE) 300 MG tablet Take 300 mg by mouth 2 (two) times daily. 09/12/12  Yes Georgina Quint Plotnikov, MD  magnesium oxide (MAG-OX) 400 (241.3 MG) MG tablet Take 2 tablets (800 mg total) by mouth 3 (three) times daily. 03/29/13  Yes Alison Murray, MD  Multiple Vitamin (MULTIVITAMIN WITH MINERALS) TABS Take 1 tablet by mouth daily. 03/18/13  Yes Elease Etienne, MD  nortriptyline (PAMELOR) 10 MG capsule Take 2 capsules (20 mg total) by mouth at bedtime. 03/29/13  Yes Alison Murray, MD  oxyCODONE-acetaminophen (PERCOCET/ROXICET) 5-325 MG per tablet Take 1 tablet by mouth at bedtime. 03/29/13  Yes Alison Murray, MD  promethazine (PHENERGAN) 12.5 MG tablet Take 12.5-25 mg by mouth every 8 (eight) hours as needed for  nausea.   Yes Historical Provider, MD  sodium bicarbonate 650 MG tablet Take 1,300 mg by mouth daily.     Yes Historical Provider, MD    Physical Exam: Filed Vitals:   04/07/13 1616  BP: 117/92  Pulse: 75  Temp: 98.1 F (36.7 C)  TempSrc: Oral  Resp: 16  SpO2: 94%    Physical Exam  Constitutional: Appears well-developed, rather somnolent on exam and frequently falling asleep HENT: Normocephalic. External right and left ear normal. Dry mucous membranes Eyes: Conjunctivae and EOM are normal. PERRLA, no scleral icterus.  Neck: Normal ROM. Neck supple. No JVD. No tracheal deviation. No thyromegaly.  CVS: RRR, S1/S2 +, no gallops, no carotid bruit.  Pulmonary: Effort and breath sounds normal, no stridor, decreased breath sounds at bases Abdominal: Soft. BS +,  no distension, tenderness, rebound or guarding.  Musculoskeletal: Normal range of motion. No edema and no tenderness.  Lymphadenopathy: No lymphadenopathy noted, cervical, inguinal. Neuro: Somnolent but easy to arouse, follows him and appropriately when awake, unable to complete full sentences as he is falling asleep Skin: Skin is warm and dry. No rash noted. Not diaphoretic. No erythema. No pallor.  Psychiatric: Normal mood and affect.   Labs on Admission:  Basic Metabolic Panel:  Recent Labs Lab 04/07/13 1645  NA 135  K 4.4  CL 95*  CO2 27  GLUCOSE 105*  BUN 34*  CREATININE 2.79*  CALCIUM 8.9   Liver Function Tests:  Recent Labs Lab 04/07/13 1645  AST 28  ALT 47  ALKPHOS 245*  BILITOT 0.2*  PROT 7.0  ALBUMIN 2.6*    Recent Labs Lab 04/07/13 1645  LIPASE 17   CBC:  Recent Labs Lab 04/07/13 1645  WBC 18.8*  NEUTROABS 15.1*  HGB 8.3*  HCT 27.2*  MCV 93.2  PLT 370    Radiological Exams on Admission: Dg Chest 2 View  04/07/2013   *RADIOLOGY REPORT*  Clinical Data: Fatigue  CHEST - 2 VIEW  Comparison: 03/22/2013  Findings: 5.7 cm right upper lobe mass/opacity, unchanged in size, although now  without definite central cavitation.  No pleural effusion or pneumothorax.  The heart is normal in size.  Degenerative changes of the visualized thoracolumbar spine.  IMPRESSION: 5.7 cm right upper lobe mass/opacity, unchanged,  although now without definite central cavitation.   Original Report Authenticated By: Charline Bills, M.D.   Dg Hip Complete Left  04/07/2013   *RADIOLOGY REPORT*  Clinical Data: Fall with left hip pain.  LEFT HIP - COMPLETE 2+ VIEW  Comparison: None  Findings: There is no evidence of acute fracture, subluxation or dislocation. No focal bony lesions are present. Minimal degenerative changes in both hips are present. An aorto-iliac stent graft is noted.  IMPRESSION: No evidence of acute bony abnormality.   Original Report Authenticated By: Harmon Pier, M.D.    EKG: Normal sinus rhythm, no ST/T wave changes  Debbora Presto, MD  [facility] Pager [phone]  If 7PM-7AM, please contact night-coverage [email or web address] Password [facility] 04/07/2013, 10:25 PM

## 2013-04-07 NOTE — ED Notes (Addendum)
Patient from [facility] nursing home brought in by EMS for increased stumbling x3 days. Patient has no reported falls.  Patient does report weakness.

## 2013-04-07 NOTE — ED Notes (Signed)
Mild tremors noted

## 2013-04-07 NOTE — ED Provider Notes (Signed)
History     CSN: [id number]  Arrival date & time 04/07/13  1610   First MD Initiated Contact with Patient 04/07/13 1618      Chief Complaint  Patient presents with  . Fatigue    (Consider location/radiation/quality/duration/timing/severity/associated sxs/prior treatment) HPI Patient presents with concern of disequilibrium, tremors, confusion. The patient has a notable recent history of newly diagnosed right upper lobe mass, with negative biopsy, and the recent development of tremor with disequilibrium. He and his wife states over the past few days history murmur, diffuse, has become more pronounced, more persistent. Concurrently the patient has had multiple falls, including one today with pain in his left hip from a fall. He denies recent loss of consciousness, any new visual changes, vomiting, head pain or neck pain. However, he and his wife state that the patient has increasing confusion over the past few days. The patient has been evaluated here several times in the past few weeks for falls, disequilibrium, tremor. His recent evaluation includes MRI brain, CT brain, CT chest abdomen pelvis.  Past Medical History  Diagnosis Date  . Diabetes mellitus   . Hypertension   . Anxiety   . COPD (chronic obstructive pulmonary disease)   . Hyperlipemia   . GERD (gastroesophageal reflux disease)   . Barrett's esophagus with high grade dysplasia     s/p RFA  . PVD (peripheral vascular disease)   . CAD (coronary artery disease)   . Tobacco user   . Depression   . Hiatal hernia   . Myocardial infarction 12/1996  . AAA (abdominal aortic aneurysm)   . Renal insufficiency 2010    Past Surgical History  Procedure Laterality Date  . Tonsillectomy    . Nissen fundoplication    . Abdominal aortic aneurysm repair  2010    and right common iliac artery aneurysm, DR HAYES  . Upper gastrointestinal endoscopy  07/07/2011    barretts esophagus, hiatal hernia, gastritis, ablations in esophagus    . Colonoscopy  05/07/2005    diverticulosis, internal and external hemorrhoids  . Abdominal aortic aneurysm repair    . Radiofrequency ablation  multiple    Barrett's, high-grade dysplasia  . Colonoscopy N/A 03/01/2013    Procedure: COLONOSCOPY;  Surgeon: Iva Boop, MD;  Location: [facility];  Service: Endoscopy;  Laterality: N/A;    Family History  Problem Relation Age of Onset  . Coronary artery disease Other   . Diabetes Other   . Asthma Other   . Mental illness Mother     dementia  . Cancer Mother   . Diabetes Mother   . Heart disease Father   . Diabetes Father   . Stomach cancer Father   . Cancer Father   . Colon cancer Neg Hx   . Esophageal cancer Neg Hx     History  Substance Use Topics  . Smoking status: Current Every Day Smoker -- 1.00 packs/day for 54 years    Types: Cigarettes  . Smokeless tobacco: Never Used  . 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  Endocrine:       Negative aside from  HPI  Genitourinary:       Neg aside from HPI   Musculoskeletal:       Per HPI, otherwise negative  Skin: Negative.   Neurological: Positive for weakness. Negative for syncope.    Allergies  Tramadol; Amlodipine besylate; Benazepril hcl; Penicillins; Risperidone; and Valsartan  Home Medications   Current Outpatient Rx  Name  Route  Sig  Dispense  Refill  . ALPRAZolam (XANAX) 0.5 MG tablet   Oral   Take 1 tablet (0.5 mg total) by mouth 2 (two) times daily as needed for sleep or anxiety.   30 tablet   0   . aspirin EC 81 MG tablet   Oral   Take 81 mg by mouth every evening.         Marland Kitchen atorvastatin (LIPITOR) 40 MG tablet   Oral   Take 40 mg by mouth every morning.         .  calcitRIOL (ROCALTROL) 0.25 MCG capsule   Oral   Take 1 capsule (0.25 mcg total) by mouth daily.   30 capsule   0   . calcium-vitamin D (OSCAL WITH D) 500-200 MG-UNIT per tablet   Oral   Take 1 tablet by mouth 3 (three) times daily.   90 tablet   0   . cholecalciferol (VITAMIN D) 1000 UNITS tablet   Oral   Take 4 tablets (4,000 Units total) by mouth daily.   30 tablet   0   . cloNIDine (CATAPRES) 0.1 MG tablet   Oral   Take 0.1 mg by mouth 2 (two) times daily.         . diphenoxylate-atropine (LOMOTIL) 2.5-0.025 MG per tablet   Oral   Take 1 tablet by mouth 4 (four) times daily as needed for diarrhea or loose stools.   60 tablet   1   . dronabinol (MARINOL) 5 MG capsule   Oral   Take 1 capsule (5 mg total) by mouth 2 (two) times daily before lunch and supper.   60 capsule   0   . DULoxetine (CYMBALTA) 60 MG capsule   Oral   Take 1 capsule (60 mg total) by mouth daily.   30 capsule   0   . famotidine (PEPCID) 40 MG tablet   Oral   Take 1 tablet (40 mg total) by mouth 2 (two) times daily.   60 tablet   0   . feeding supplement (RESOURCE BREEZE) LIQD   Oral   Take 1 Container by mouth 4 (four) times daily -  with meals and at bedtime.   1 Container   0   . gabapentin (NEURONTIN) 300 MG capsule   Oral   Take 300 mg by mouth 2 (two) times daily.          . iron polysaccharides (NIFEREX) 150 MG capsule   Oral   Take 150 mg by mouth 2 (two) times daily.         Marland Kitchen labetalol (NORMODYNE) 300 MG tablet   Oral   Take 300 mg by mouth 2 (two) times daily.         . magnesium oxide (MAG-OX) 400 (241.3 MG) MG tablet   Oral   Take 2 tablets (800 mg total) by mouth 3 (three) times daily.   90 tablet   0   . Multiple Vitamin (MULTIVITAMIN WITH MINERALS) TABS   Oral   Take 1 tablet by mouth daily.         Marland Kitchen  nortriptyline (PAMELOR) 10 MG capsule   Oral   Take 2 capsules (20 mg total) by mouth at bedtime.   30 capsule   0   . oxyCODONE-acetaminophen  (PERCOCET/ROXICET) 5-325 MG per tablet   Oral   Take 1 tablet by mouth at bedtime.   30 tablet   0   . promethazine (PHENERGAN) 12.5 MG tablet   Oral   Take 12.5-25 mg by mouth every 8 (eight) hours as needed for nausea.         . sodium bicarbonate 650 MG tablet   Oral   Take 1,300 mg by mouth daily.             BP 117/92  Pulse 75  Temp(Src) 98.1 F (36.7 C) (Oral)  Resp 16  SpO2 94%  Physical Exam  Nursing note and vitals reviewed. Constitutional: He is oriented to person, place, and time. He appears well-developed and well-nourished. No distress.  HENT:  Head: Normocephalic and atraumatic.  No significant signs of head trauma. No hematomas. No battle sign or raccoon eyes.  Eyes: Conjunctivae and EOM are normal. Pupils are equal, round, and reactive to light.  Arcus senilis  Neck: Normal range of motion. Neck supple.  No cervical midline tenderness  Cardiovascular: Normal rate and regular rhythm.   Pulmonary/Chest: Effort normal and breath sounds normal. No respiratory distress. He has no wheezes. He has no rales.  Abdominal: Soft. Normal appearance. There is no tenderness. There is no rebound and no guarding.  Musculoskeletal: Normal range of motion. He exhibits no edema.       Right shoulder: Normal.       Right hip: Normal.       Right knee: Normal.       Right ankle: Normal.       Legs: Neurological: He is alert and oriented to person, place, and time. No cranial nerve deficit.  Strength equal bilaterally. Reports normal sensations to light touch. Patient does have tremor to extremities worsened movement.  Skin: Skin is warm.  Psychiatric: He has a normal mood and affect. His behavior is normal.    ED Course  Procedures (including critical care time)  Labs Reviewed  URINALYSIS, ROUTINE W REFLEX MICROSCOPIC  CBC WITH DIFFERENTIAL  COMPREHENSIVE METABOLIC PANEL  LIPASE, BLOOD  CG4 I-STAT (LACTIC ACID)   No results found.   No diagnosis  found.  O2- 99%ra, normal  After the initial evaluation I reviewed the patient's chart, including imaging studies.,  Patient is to recent head CTs, one recent head MRI, chest, abdomen, pelvis CT.    MDM  This patient with recently diagnosed pulmonary mass, now presents with tremors, falls, confusion.  The patient on exam has mild tremor, but his in no distress.  They seem to be stable.  Labs are notable for demonstration of progressive renal dysfunction. The patient is awake and alert, but with his mass, his progression of complaints, his worsening renal function, the need for additional pulmonology evaluation, nephrology evaluation, he was admitted for further evaluation and management.        Gerhard Munch, MD 04/07/13 2147

## 2013-04-08 DIAGNOSIS — Z9181 History of falling: Secondary | ICD-10-CM

## 2013-04-08 DIAGNOSIS — E43 Unspecified severe protein-calorie malnutrition: Secondary | ICD-10-CM

## 2013-04-08 DIAGNOSIS — R627 Adult failure to thrive: Secondary | ICD-10-CM

## 2013-04-08 LAB — CBC
HCT: 24.3 % — ABNORMAL LOW (ref 39.0–52.0)
MCH: 29.1 pg (ref 26.0–34.0)
MCHC: 30.9 g/dL (ref 30.0–36.0)
MCV: 94.2 fL (ref 78.0–100.0)
RDW: 14 % (ref 11.5–15.5)
WBC: 12.6 10*3/uL — ABNORMAL HIGH (ref 4.0–10.5)

## 2013-04-08 LAB — BASIC METABOLIC PANEL
BUN: 28 mg/dL — ABNORMAL HIGH (ref 6–23)
CO2: 27 mEq/L (ref 19–32)
Calcium: 8.5 mg/dL (ref 8.4–10.5)
Chloride: 101 mEq/L (ref 96–112)
Creatinine, Ser: 2.51 mg/dL — ABNORMAL HIGH (ref 0.50–1.35)
Glucose, Bld: 96 mg/dL (ref 70–99)

## 2013-04-08 MED ORDER — LABETALOL HCL 100 MG PO TABS
100.0000 mg | ORAL_TABLET | Freq: Two times a day (BID) | ORAL | Status: DC
  Administered 2013-04-08 – 2013-04-10 (×4): 100 mg via ORAL
  Filled 2013-04-08 (×6): qty 1

## 2013-04-08 MED ORDER — FERUMOXYTOL INJECTION 510 MG/17 ML
510.0000 mg | Freq: Once | INTRAVENOUS | Status: AC
  Administered 2013-04-08: 510 mg via INTRAVENOUS
  Filled 2013-04-08: qty 17

## 2013-04-08 MED ORDER — GABAPENTIN 100 MG PO CAPS
100.0000 mg | ORAL_CAPSULE | Freq: Two times a day (BID) | ORAL | Status: DC
  Administered 2013-04-08 – 2013-04-10 (×4): 100 mg via ORAL
  Filled 2013-04-08 (×5): qty 1

## 2013-04-08 MED ORDER — CLONIDINE HCL 0.1 MG PO TABS
0.1000 mg | ORAL_TABLET | Freq: Every day | ORAL | Status: DC
  Administered 2013-04-09 – 2013-04-10 (×2): 0.1 mg via ORAL
  Filled 2013-04-08 (×2): qty 1

## 2013-04-08 NOTE — Care Management Note (Addendum)
    Page 1 of 1   04/08/2013     3:29:05 PM   CARE MANAGEMENT NOTE 04/08/2013  Patient:  [name], [name]   Account Number:  [id number]  Date Initiated:  04/08/2013  Documentation initiated by:  Lanier Clam  Subjective/Objective Assessment:   ADMITTED W/FALL,FTT.DNR.ZO:XWRU,EA.READMIT-5/21-5/28/14     Action/Plan:   FROM SNF[facility]   Anticipated DC Date:  04/09/2013   Anticipated DC Plan:  HOME W HOME HEALTH SERVICES      DC Planning Services  CM consult      Choice offered to / List presented to:             Status of service:  In process, will continue to follow Medicare Important Message given?   (If response is "NO", the following Medicare IM given date fields will be blank) Date Medicare IM given:   Date Additional Medicare IM given:    Discharge Disposition:    Per UR Regulation:    If discussed at Long Length of Stay Meetings, dates discussed:    Comments:  04/08/13 [facility] RN,BSN,NCM WEEKEND [phone] AWAIT PT/OT RECOMMENDATIONS.PATIENT STATES HE WANTS HOME W/HH,DOES NOT WANT TO RETURN TO SNF WHILE SPOUSE PRESENT.INFORMED PATIENT/SPOUSE OF HHC SERVICES-INTERMITTENT SKILLED SERVICES,& PRIVATE SITTER SERVICE-OUT OF POCKET PAY-CAN PROVIDE 24HR CARE.PROVIDED W/HHC, & PRIVATE SITTER LIST.CSW NOTIFIED OF ADMISSION, & CURRENT PLANS PER PATIENT.

## 2013-04-08 NOTE — Progress Notes (Addendum)
Patient found to be orthostatic  BP supine: 154/67 mmhg  sitting: 110/80 mmhg Standing: 98/52 mmhg  Reduce clonidine to 0.1 mg daily and labetalol dose to 100 mg bid

## 2013-04-08 NOTE — Progress Notes (Addendum)
[facility] PROGRESS NOTE  PETRO TALENT ZOX:[id number] DOB: 1940-11-26 DOA: 04/07/2013 PCP: Sonda Primes, MD  Brief narrative: 72 year old male with history of hypertension, diabetes, COPD, CAD, AAA, GERD with barret's esophagus,  CKD stage IV ( baseline creatinine of  2.2-2.8), and right lung mass who was sent from [facility] with generalized weakness and frequent falls.. Patient was recently hospitalized for similar symptoms..recent w/up included MRI brain, CT head could not explain frequent falls and disequilibrium.CT of the abdomen and pelvis with no signs of metastatic process.  Patient has persistent tremors in lower extremities and gets unsteady with frequent falls. He also reports  poor appetite and  weight loss in the ED patient was found to have leucocytosis and AKI on CKD with creatinine of 2.79. [facility] admitted patient to medical floor.   Assessment/Plan: generalized weakness with fall No clear etiology. Noted  that patient is on a multiple different medications  Including xanax, Marinol, cymbalta, phenergan, nortriptyline, restoril for sleep, percocet  and neurontin which all  might be contributing to his unsteadiness. i will reduce dose of neurontin, clonidine . Hold nortriptyline and marinol.  Had neurological w/up done on recent hospitalization. Patient has resting tremors with episodes fo falling forward on standing. He could have signs of early parkinson's and would benefit from outpt neurology evaluation. -check orthostatic vitals. He did have orthostatic hypotension during earlier admission for syncope. -IV fluids -PT eval  AKI on CKD Patient not on any nephrotic agents. Could be prerenal in etiology.  Baseline creatinine of 2.2-2.8 with stage IV CKD. Follows with Dr Hyman Hopes who saw him yesterday and held his BP meds ? Clonidine .  Anemia  has iron def anemia  Hx of barrett's esophagus and has had EGD and colonoscopy in April  Hb of  7.5 today, no  signs of bleeding Will transfuse him with 1 U PRBC  Leucocytosis No signs fo infection , wbc improved in am labs  COPD  stable. continue home inhalers   Right lung mass  bx done in April and negative for malignancy  Severe protein calorie malnutrition Continue nutrition supplements. Nutrition consult  Iron deficiency anemia  i will give him a dose of IV feraheme 510 mg x 1  continue iron supplement   Diabetes mellitus  A1C of 5.7. Continue home mds  DVT prophylaxis   Code Status: DNR Family Communication: wife at bedside Disposition Plan: PT eval   Consultants:  none  Procedures:  none  Antibiotics:  none  HPI/Subjective: admission H&P reviewed. Reports tremors of his hands and being unsteady with frequent fall. C/o left hip pain  Objective: Filed Vitals:   04/07/13 1616 04/07/13 2235 04/07/13 2340 04/08/13 0617  BP: 117/92 125/56 140/68 144/67  Pulse: 75 71 72 67  Temp: 98.1 F (36.7 C) 97.8 F (36.6 C) 98.2 F (36.8 C) 98 F (36.7 C)  TempSrc: Oral Oral Oral Oral  Resp: 16 17 20 20   Height:   5\' 6"  (1.676 m)   Weight:   65.772 kg (145 lb)   SpO2: 94% 96% 100% 97%    Intake/Output Summary (Last 24 hours) at 04/08/13 1405 Last data filed at 04/08/13 0800  Gross per 24 hour  Intake    571 ml  Output    500 ml  Net     71 ml   Filed Weights   04/07/13 2340  Weight: 65.772 kg (145 lb)    Exam:   General:  Elderly male in AND  HEENT:  pallor +, no icterus, moist mucosa  Cardiovascular: NS1&S2, no murmurs  Respiratory: clear b/l, no added sounds  Abdomen: soft, NT, ND BS+  Musculoskeletal: warm, no edema, painful ROM of left hip, no bruises  CNS: AAOX3, fine rest tremors of hands, no rigidity, normal motor tone and reflexes, no focal deficit. Gait not assessed.   Data Reviewed: Basic Metabolic Panel:  Recent Labs Lab 04/07/13 1645 04/08/13 0630  NA 135 137  K 4.4 3.9  CL 95* 101  CO2 27 27  GLUCOSE 105* 96  BUN 34* 28*   CREATININE 2.79* 2.51*  CALCIUM 8.9 8.5   Liver Function Tests:  Recent Labs Lab 04/07/13 1645  AST 28  ALT 47  ALKPHOS 245*  BILITOT 0.2*  PROT 7.0  ALBUMIN 2.6*    Recent Labs Lab 04/07/13 1645  LIPASE 17   No results found for this basename: AMMONIA,  in the last 168 hours CBC:  Recent Labs Lab 04/07/13 1645 04/08/13 0630  WBC 18.8* 12.6*  NEUTROABS 15.1*  --   HGB 8.3* 7.5*  HCT 27.2* 24.3*  MCV 93.2 94.2  PLT 370 295   Cardiac Enzymes: No results found for this basename: CKTOTAL, CKMB, CKMBINDEX, TROPONINI,  in the last 168 hours BNP (last 3 results) No results found for this basename: PROBNP,  in the last 8760 hours CBG: No results found for this basename: GLUCAP,  in the last 168 hours  No results found for this or any previous visit (from the past 240 hour(s)).   Studies: Dg Chest 2 View  04/07/2013   *RADIOLOGY REPORT*  Clinical Data: Fatigue  CHEST - 2 VIEW  Comparison: 03/22/2013  Findings: 5.7 cm right upper lobe mass/opacity, unchanged in size, although now without definite central cavitation.  No pleural effusion or pneumothorax.  The heart is normal in size.  Degenerative changes of the visualized thoracolumbar spine.  IMPRESSION: 5.7 cm right upper lobe mass/opacity, unchanged, although now without definite central cavitation.   Original Report Authenticated By: Charline Bills, M.D.   Dg Hip Complete Left  04/07/2013   *RADIOLOGY REPORT*  Clinical Data: Fall with left hip pain.  LEFT HIP - COMPLETE 2+ VIEW  Comparison: None  Findings: There is no evidence of acute fracture, subluxation or dislocation. No focal bony lesions are present. Minimal degenerative changes in both hips are present. An aorto-iliac stent graft is noted.  IMPRESSION: No evidence of acute bony abnormality.   Original Report Authenticated By: Harmon Pier, M.D.    Scheduled Meds: . aspirin EC  81 mg Oral QPM  . atorvastatin  40 mg Oral q1800  . calcitRIOL  0.25 mcg Oral Daily   . calcium-vitamin D  1 tablet Oral TID  . cholecalciferol  4,000 Units Oral Daily  . cloNIDine  0.1 mg Oral BID  . dronabinol  5 mg Oral BID AC  . DULoxetine  60 mg Oral Daily  . enoxaparin (LOVENOX) injection  30 mg Subcutaneous QHS  . famotidine  40 mg Oral BID  . feeding supplement  1 Container Oral TID WC & HS  . gabapentin  300 mg Oral BID  . iron polysaccharides  150 mg Oral BID  . labetalol  300 mg Oral BID  . magnesium oxide  800 mg Oral TID  . multivitamin with minerals  1 tablet Oral Daily  . nortriptyline  20 mg Oral QHS  . oxyCODONE-acetaminophen  1 tablet Oral QHS  . sodium bicarbonate  1,300 mg Oral Daily  Continuous Infusions: . sodium chloride 50 mL/hr at 04/07/13 2358      Time spent: 25 minutes    Kloie Whiting  [facility] Pager [phone] If 7PM-7AM, please contact night-coverage at [email or web address], password [facility] 04/08/2013, 2:05 PM  LOS: 1 day

## 2013-04-08 NOTE — Evaluation (Signed)
Physical Therapy Evaluation Patient Details Name: [name] MRN: [id number] DOB: 1941/06/10 Today's Date: 04/08/2013 Time: 4098-1191 PT Time Calculation (min): 23 min  PT Assessment / Plan / Recommendation Clinical Impression  72 year old male with history of hypertension, diabetes, COPD, CAD, AAA, GERD with barret's esophagus,  CKD stage IV ( baseline creatinine of  2.2-2.8), and right lung mass who was sent from [facility] with generalized weakness and frequent falls.. Patient was recently hospitalized for similar symptoms..recent w/up included MRI brain, CT head could not explain frequent falls and disequilibrium.CT of the abdomen and pelvis with no signs of metastatic process.  Patient has persistent tremors in lower extremities and gets unsteady with frequent falls. Pt will benefit from PT to maximize independence and prevent future falls due to defictis below.    PT Assessment  Patient needs continued PT services    Follow Up Recommendations  Supervision for mobility/OOB;Home health PT (pt does not want to go to SNF)    Does the patient have the potential to tolerate intense rehabilitation      Barriers to Discharge        Equipment Recommendations  None recommended by PT    Recommendations for Other Services     Frequency Min 3X/week    Precautions / Restrictions Precautions Precautions: Fall Precaution Comments: pt and wife report tremors intermittently and LLE pain/avoid touching it)   Pertinent Vitals/Pain BP supine 154/67 BP sitting 110/80 BP standing 98/52 No dizziness; RN and MD informed     Mobility  Bed Mobility Bed Mobility: Supine to Sit Supine to Sit: 6: Modified independent (Device/Increase time) Details for Bed Mobility Assistance: no tremors noted today Transfers Transfers: Sit to Stand;Stand to Sit Sit to Stand: 4: Min guard;From bed Stand to Sit: 4: Min guard;To chair/3-in-1 Details for Transfer Assistance: cues for hand placement and to  control descent Ambulation/Gait Ambulation/Gait Assistance: 4: Min guard Ambulation Distance (Feet): 100 Feet Assistive device: Rolling walker Ambulation/Gait Assistance Details: cues for posture;  Gait Pattern: Step-through pattern;Decreased stride length Gait velocity: decreased General Gait Details: pt orthostatic as well as low Hgb 7.5 today; asymptomatic with mobility today    Exercises     PT Diagnosis: Difficulty walking  PT Problem List: Decreased strength;Decreased activity tolerance;Decreased balance;Decreased mobility PT Treatment Interventions:     PT Goals Acute Rehab PT Goals PT Goal Formulation: With patient Time For Goal Achievement: 04/29/13 Potential to Achieve Goals: Fair Pt will go Supine/Side to Sit: Independently PT Goal: Supine/Side to Sit - Progress: Goal set today Pt will go Sit to Supine/Side: Independently PT Goal: Sit to Supine/Side - Progress: Goal set today Pt will go Sit to Stand: with modified independence PT Goal: Sit to Stand - Progress: Goal set today Pt will Ambulate: 51 - 150 feet;with supervision;with rolling walker PT Goal: Ambulate - Progress: Goal set today Pt will Go Up / Down Stairs: 1-2 stairs;with least restrictive assistive device;with min assist PT Goal: Up/Down Stairs - Progress: Goal set today  Visit Information  Last PT Received On: 04/08/13 Assistance Needed: +1    Subjective Data  Subjective: how do you say that doctor's name? Patient Stated Goal: rehab   Prior Functioning  Home Living Lives With: Spouse Available Help at Discharge: Family (works part-time) Type of Home: Aeronautical engineer of Steps: 1 Home Layout: One level Home Adaptive Equipment: Walker - rolling Additional Comments: wife works part time; works form 3 to close (at least by 9) most days Prior Function Level of Independence:  Needs assistance Needs Assistance: Bathing;Dressing Bath: Moderate Dressing:  Moderate Communication Communication: No difficulties Dominant Hand: Right    Cognition  Cognition Arousal/Alertness: Awake/alert Behavior During Therapy: WFL for tasks assessed/performed Overall Cognitive Status: Within Functional Limits for tasks assessed    Extremity/Trunk Assessment Right Upper Extremity Assessment RUE ROM/Strength/Tone: WFL for tasks assessed Left Upper Extremity Assessment LUE ROM/Strength/Tone: WFL for tasks assessed Right Lower Extremity Assessment RLE ROM/Strength/Tone Deficits: at least 3+/5;  RLE Coordination Deficits: coordination/motor affected significantly by tremors Left Lower Extremity Assessment LLE ROM/Strength/Tone: Deficits;Unable to fully assess;Due to pain LLE ROM/Strength/Tone Deficits: at least 3+/5   Balance Static Sitting Balance Static Sitting - Balance Support: No upper extremity supported;Feet supported Static Sitting - Level of Assistance: 6: Modified independent (Device/Increase time) Static Standing Balance Static Standing - Balance Support: Bilateral upper extremity supported Static Standing - Level of Assistance: 5: Stand by assistance  End of Session PT - End of Session Equipment Utilized During Treatment: Gait belt Activity Tolerance: Patient limited by fatigue Patient left: in chair;with family/visitor present;with call bell/phone within reach Nurse Communication: Mobility status  GP     [facility] 04/08/2013, 3:13 PM

## 2013-04-09 DIAGNOSIS — D72829 Elevated white blood cell count, unspecified: Secondary | ICD-10-CM

## 2013-04-09 DIAGNOSIS — R259 Unspecified abnormal involuntary movements: Secondary | ICD-10-CM

## 2013-04-09 DIAGNOSIS — I951 Orthostatic hypotension: Principal | ICD-10-CM

## 2013-04-09 LAB — BASIC METABOLIC PANEL
BUN: 24 mg/dL — ABNORMAL HIGH (ref 6–23)
Creatinine, Ser: 2.01 mg/dL — ABNORMAL HIGH (ref 0.50–1.35)
GFR calc Af Amer: 37 mL/min — ABNORMAL LOW (ref >90)
GFR calc non Af Amer: 32 mL/min — ABNORMAL LOW (ref >90)
Potassium: 3.9 mEq/L (ref 3.5–5.1)

## 2013-04-09 LAB — CBC
HCT: 24.6 % — ABNORMAL LOW (ref 39.0–52.0)
MCHC: 30.9 g/dL (ref 30.0–36.0)
Platelets: 333 10*3/uL (ref 150–400)
RDW: 13.8 % (ref 11.5–15.5)

## 2013-04-09 LAB — ABO/RH: ABO/RH(D): A POS

## 2013-04-09 MED ORDER — HYDRALAZINE HCL 10 MG PO TABS
10.0000 mg | ORAL_TABLET | Freq: Once | ORAL | Status: AC
  Administered 2013-04-09: 10 mg via ORAL
  Filled 2013-04-09: qty 1

## 2013-04-09 MED ORDER — OXYCODONE-ACETAMINOPHEN 5-325 MG PO TABS
2.0000 | ORAL_TABLET | Freq: Once | ORAL | Status: AC
  Administered 2013-04-09: 2 via ORAL

## 2013-04-09 MED ORDER — ENOXAPARIN SODIUM 40 MG/0.4ML ~~LOC~~ SOLN
40.0000 mg | Freq: Every day | SUBCUTANEOUS | Status: DC
  Administered 2013-04-09: 40 mg via SUBCUTANEOUS
  Filled 2013-04-09 (×2): qty 0.4

## 2013-04-09 NOTE — Progress Notes (Signed)
[facility] PROGRESS NOTE  [name]:[id number] DOB: 07-Feb-1941 DOA: 04/07/2013 PCP: Sonda Primes, MD  Brief narrative:  72 year old male with history of hypertension, diabetes, COPD, CAD, AAA, GERD with barret's esophagus, CKD stage IV ( baseline creatinine of 2.2-2.8), and right lung mass who was sent from [facility] with generalized weakness and frequent falls.. Patient was recently hospitalized for similar symptoms..recent w/up included MRI brain, CT head could not explain frequent falls and disequilibrium.CT of the abdomen and pelvis with no signs of metastatic process. Patient has persistent tremors in lower extremities and gets unsteady with frequent falls. He also reports poor appetite and weight loss in the ED patient was found to have leucocytosis and AKI on CKD with creatinine of 2.79. [facility] admitted patient to medical floor.   Assessment/Plan:  generalized weakness with fall  Likely due to orthostatic drop in BP and polypharmacy --Patient was orthostatic.Marland Kitchen He had orthostatic hypotension during earlier admission for syncope as well.   Noted that patient is on a multiple different medications Including xanax, Marinol, cymbalta, phenergan, nortriptyline, restoril for sleep, percocet and neurontin which all might be contributing to his unsteadiness. i have reduced dose of neurontin, clonidine . Hold nortriptyline and marinol.  Had neurological w/up done on recent hospitalization. Patient has resting tremors with episodes fo falling forward on standing. He could have signs of early parkinson's and would benefit from outpt neurology evaluation.  -gentle  IV fluids  -PT eval recommends Home with 24 hr supervision. Patient does not want to return to SNF  AKI on CKD  Patient not on any nephrotoxic agents. prerenal in etiology and improving with IV fluids.  Baseline creatinine of 2.2-2.8 with stage IV CKD. Follows with Dr Hyman Hopes who saw him one day prior to  admission.   Hypertension  noted for orthostatic drop in BP on 6/7 . Reduced dose of clonidine to 0.1 mg daily and labetalol to 100 mg bid. Recheck orthostatic vitals today. BOP elevated this am. Will permit some elevated BP today. Ordered TED stockings.   Anemia  has iron def anemia  Hx of barrett's esophagus and has had EGD and colonoscopy in April  Given 1 dose of fereheme on 6/7. Hb still 7.6. Will transfuse with 1 U prbc  Leucocytosis  No signs of infection.  COPD  stable. continue home inhalers   Right lung mass  bx done in April and negative for malignancy   Severe protein calorie malnutrition  Continue nutrition supplements. Nutrition consult   Iron deficiency anemia  Given  a dose of IV feraheme 510 mg x 1  continue iron supplement   Diabetes mellitus  A1C of 5.7. Continue home meds   DVT prophylaxis   Code Status: DNR   Family Communication: none at bedside  Disposition Plan home with [facility] if stable overnight Consultants:  none Procedures:  none Antibiotics:  none HPI/Subjective:  Denies tremors today. Ambulated with walker and support this am.    Objective: Filed Vitals:   04/08/13 0617 04/08/13 1400 04/08/13 2121 04/09/13 0555  BP: 144/67 157/63 164/57 181/84  Pulse: 67 72 76 79  Temp: 98 F (36.7 C) 97.4 F (36.3 C) 97.9 F (36.6 C) 98.4 F (36.9 C)  TempSrc: Oral Oral Oral Oral  Resp: 20 18 18 20   Height:      Weight:    66.089 kg (145 lb 11.2 oz)  SpO2: 97% 98% 99% 96%    Intake/Output Summary (Last 24 hours) at 04/09/13 8119 Last data  filed at 04/09/13 0757  Gross per 24 hour  Intake    240 ml  Output   1220 ml  Net   -980 ml   Filed Weights   04/07/13 2340 04/09/13 0555  Weight: 65.772 kg (145 lb) 66.089 kg (145 lb 11.2 oz)    Exam:  General: Elderly male in NAD  HEENT: pallor +, no icterus, moist mucosa  Cardiovascular: NS1&S2, no murmurs  Respiratory: clear b/l, no added sounds  Abdomen: soft, NT, ND BS+   Musculoskeletal: warm, no edema, painful ROM of left hip, no bruises  CNS: AAOX3,No tremors today,  no rigidity, normal motor tone and reflexes, no focal deficit.    Data Reviewed: Basic Metabolic Panel:  Recent Labs Lab 04/07/13 1645 04/08/13 0630 04/09/13 0400  NA 135 137 133*  K 4.4 3.9 3.9  CL 95* 101 99  CO2 27 27 27   GLUCOSE 105* 96 111*  BUN 34* 28* 24*  CREATININE 2.79* 2.51* 2.01*  CALCIUM 8.9 8.5 8.9   Liver Function Tests:  Recent Labs Lab 04/07/13 1645  AST 28  ALT 47  ALKPHOS 245*  BILITOT 0.2*  PROT 7.0  ALBUMIN 2.6*    Recent Labs Lab 04/07/13 1645  LIPASE 17   No results found for this basename: AMMONIA,  in the last 168 hours CBC:  Recent Labs Lab 04/07/13 1645 04/08/13 0630 04/09/13 0400  WBC 18.8* 12.6* 14.4*  NEUTROABS 15.1*  --   --   HGB 8.3* 7.5* 7.6*  HCT 27.2* 24.3* 24.6*  MCV 93.2 94.2 93.9  PLT 370 295 333   Cardiac Enzymes: No results found for this basename: CKTOTAL, CKMB, CKMBINDEX, TROPONINI,  in the last 168 hours BNP (last 3 results) No results found for this basename: PROBNP,  in the last 8760 hours CBG: No results found for this basename: GLUCAP,  in the last 168 hours  No results found for this or any previous visit (from the past 240 hour(s)).   Studies: Dg Chest 2 View  04/07/2013   *RADIOLOGY REPORT*  Clinical Data: Fatigue  CHEST - 2 VIEW  Comparison: 03/22/2013  Findings: 5.7 cm right upper lobe mass/opacity, unchanged in size, although now without definite central cavitation.  No pleural effusion or pneumothorax.  The heart is normal in size.  Degenerative changes of the visualized thoracolumbar spine.  IMPRESSION: 5.7 cm right upper lobe mass/opacity, unchanged, although now without definite central cavitation.   Original Report Authenticated By: Charline Bills, M.D.   Dg Hip Complete Left  04/07/2013   *RADIOLOGY REPORT*  Clinical Data: Fall with left hip pain.  LEFT HIP - COMPLETE 2+ VIEW  Comparison:  None  Findings: There is no evidence of acute fracture, subluxation or dislocation. No focal bony lesions are present. Minimal degenerative changes in both hips are present. An aorto-iliac stent graft is noted.  IMPRESSION: No evidence of acute bony abnormality.   Original Report Authenticated By: Harmon Pier, M.D.    Scheduled Meds: . aspirin EC  81 mg Oral QPM  . atorvastatin  40 mg Oral q1800  . calcitRIOL  0.25 mcg Oral Daily  . calcium-vitamin D  1 tablet Oral TID  . cholecalciferol  4,000 Units Oral Daily  . cloNIDine  0.1 mg Oral Daily  . DULoxetine  60 mg Oral Daily  . enoxaparin (LOVENOX) injection  30 mg Subcutaneous QHS  . famotidine  40 mg Oral BID  . feeding supplement  1 Container Oral TID WC & HS  .  gabapentin  100 mg Oral BID  . iron polysaccharides  150 mg Oral BID  . labetalol  100 mg Oral BID  . magnesium oxide  800 mg Oral TID  . multivitamin with minerals  1 tablet Oral Daily  . oxyCODONE-acetaminophen  1 tablet Oral QHS  . sodium bicarbonate  1,300 mg Oral Daily   Continuous Infusions: . sodium chloride 50 mL/hr at 04/08/13 1614      Time spent: 25 minutes    Odilon Cass  [facility] Pager [phone]. If 7PM-7AM, please contact night-coverage at [email or web address], password [email or web address] 04/09/2013, 8:52 AM  LOS: 2 days

## 2013-04-09 NOTE — Progress Notes (Signed)
OT Cancellation Note  Patient Details Name: KAYMAN SNUFFER MRN: [id number] DOB: 11-Jul-1941   Cancelled Treatment:    Reason Eval/Treat Not Completed: Other (comment)--pt just started getting blood when I went in to see him at 11:47 am.  Evette Georges [phone] 04/09/2013, 3:49 PM

## 2013-04-10 ENCOUNTER — Institutional Professional Consult (permissible substitution): Payer: Medicare Other | Admitting: Pulmonary Disease

## 2013-04-10 DIAGNOSIS — R251 Tremor, unspecified: Secondary | ICD-10-CM | POA: Diagnosis present

## 2013-04-10 LAB — CBC
MCHC: 31.4 g/dL (ref 30.0–36.0)
Platelets: 328 10*3/uL (ref 150–400)
RDW: 14.1 % (ref 11.5–15.5)

## 2013-04-10 LAB — BASIC METABOLIC PANEL
GFR calc Af Amer: 44 mL/min — ABNORMAL LOW (ref >90)
GFR calc non Af Amer: 38 mL/min — ABNORMAL LOW (ref >90)
Potassium: 3.8 mEq/L (ref 3.5–5.1)
Sodium: 135 mEq/L (ref 135–145)

## 2013-04-10 LAB — TYPE AND SCREEN: Unit division: 0

## 2013-04-10 MED ORDER — LABETALOL HCL 200 MG PO TABS: 200.0000 mg | ORAL_TABLET | Freq: Two times a day (BID) | ORAL | Status: DC

## 2013-04-10 MED ORDER — OXYCODONE-ACETAMINOPHEN 5-325 MG PO TABS: 1.0000 | ORAL_TABLET | Freq: Two times a day (BID) | ORAL | Status: DC | PRN

## 2013-04-10 MED ORDER — HYDRALAZINE HCL 20 MG/ML IJ SOLN
10.0000 mg | Freq: Once | INTRAMUSCULAR | Status: AC
  Administered 2013-04-10: 10 mg via INTRAVENOUS
  Filled 2013-04-10: qty 1

## 2013-04-10 MED ORDER — HYDRALAZINE HCL 25 MG PO TABS: 25.0000 mg | ORAL_TABLET | Freq: Four times a day (QID) | ORAL | Status: DC | PRN

## 2013-04-10 MED ORDER — GABAPENTIN 100 MG PO CAPS: 100.0000 mg | ORAL_CAPSULE | Freq: Two times a day (BID) | ORAL | Status: DC

## 2013-04-10 MED ORDER — HYDRALAZINE HCL 20 MG/ML IJ SOLN
20.0000 mg | Freq: Once | INTRAMUSCULAR | Status: AC
  Administered 2013-04-10: 20 mg via INTRAVENOUS
  Filled 2013-04-10: qty 1

## 2013-04-10 NOTE — Progress Notes (Signed)
Clinical Social Work Department BRIEF PSYCHOSOCIAL ASSESSMENT 04/10/2013  Patient:  [name], [name]     Account Number:  [id number]     Admit date:  04/07/2013  Clinical Social Worker:  Candie Chroman  Date/Time:  04/10/2013 01:12 PM  Referred by:  Physician  Date Referred:  04/10/2013 Referred for  SNF Placement   Other Referral:   Interview type:  Family Other interview type:    PSYCHOSOCIAL DATA Living Status:  FACILITY Admitted from facility:  [facility] Level of care:  Skilled Nursing Facility Primary support name:  [name] Primary support relationship to patient:  SPOUSE Degree of support available:   supportive    CURRENT CONCERNS Current Concerns  Post-Acute Placement   Other Concerns:    SOCIAL WORK ASSESSMENT / PLAN Pt is a 72 yr old gentleman admitted from [facility]. CSW met with pt / spouse to assist with d/c planning. Pt had initially planned to return home following hospital d/c. Pt/spouse are now requesting rehab placement. [facility] has been contacted but no longer has a private room available. CSW has initiated SNF search , requesting pvt room , and will provided bed offers as received.   Assessment/plan status:  Psychosocial Support/Ongoing Assessment of Needs Other assessment/ plan:   Home vs SNF   Information/referral to community resources:   SNF list with bed offers to be provided.    PATIENT'S/FAMILY'S RESPONSE TO PLAN OF CARE: Pt will accept [facility] providing a pvt room is available. Spouse states pt will want to return home if a pvt room is not available.   Cori Razor LCSW [phone]

## 2013-04-10 NOTE — Progress Notes (Signed)
Clinical Social Work Department CLINICAL SOCIAL WORK PLACEMENT NOTE 04/10/2013  Patient:  [name], [name]  Account Number:  [id number] Admit date:  04/07/2013  Clinical Social Worker:  Cori Razor, LCSW  Date/time:  04/10/2013 01:22 PM  Clinical Social Work is seeking post-discharge placement for this patient at the following level of care:   SKILLED NURSING   (*CSW will update this form in Epic as items are completed)     Patient/family provided with [facility] Department of Clinical Social Work's list of facilities offering this level of care within the geographic area requested by the patient (or if unable, by the patient's family).  04/10/2013  Patient/family informed of their freedom to choose among providers that offer the needed level of care, that participate in Medicare, Medicaid or managed care program needed by the patient, have an available bed and are willing to accept the patient.    Patient/family informed of [facility]' ownership interest in [facility], as well as of the fact that they are under no obligation to receive care at this facility.  PASARR submitted to [employer] on  PASARR number received from [employer] on 03/23/2013  FL2 transmitted to all facilities in geographic area requested by pt/family on  04/10/2013 FL2 transmitted to all facilities within larger geographic area on   Patient informed that his/her managed care company has contracts with or will negotiate with  certain facilities, including the following:     Patient/family informed of bed offers received:   Patient chooses bed at  Physician recommends and patient chooses bed at    Patient to be transferred to  on   Patient to be transferred to facility by   The following physician request were entered in Epic:   Additional Comments:  Cori Razor LCSW [phone]

## 2013-04-10 NOTE — Progress Notes (Signed)
PT Cancellation Note  Patient Details Name: [name] MRN: [id number] DOB: 11-29-1940   Cancelled Treatment:    Reason Eval/Treat Not Completed: Other (comment) (pt getting ready to d/c to SNF, refused gait)   Lurena Joiner B. Ruhi Kopke, PT, DPT [phone]   04/10/2013, 3:11 PM

## 2013-04-10 NOTE — Discharge Summary (Addendum)
Physician Discharge Summary  [name] WUJ:[id number] DOB: 07-16-1941 DOA: 04/07/2013  PCP: Sonda Primes, MD  Admit date: 04/07/2013 Discharge date: 04/10/2013  Time spent: 40 minutes  Recommendations for Outpatient Follow-up:  1. discharge to SNF 2. Please refer to neurology for evaluation if patient has further tremors. 3. Please monitor BP closely. Check CBC and BMET in 2-3 days  Discharge Diagnoses:   Principal Problem:   Frequent falls  Active Problems: Orthostatic hypotension   Renal failure (ARF), acute on chronic   Protein-calorie malnutrition, severe   COPD   DM (diabetes mellitus), type 2 with peripheral vascular complications   Right Lung mass   Leukocytosis   Failure to thrive in adult   Normocytic anemia   Tremors of nervous system   Iron deficiency anemia   Discharge Condition: fair  Diet recommendation: low sodium  Filed Weights   04/07/13 2340 04/09/13 0555  Weight: 65.772 kg (145 lb) 66.089 kg (145 lb 11.2 oz)    History of present illness:  Please refer to admission H&P for details, but in brief, 72 year old male with history of hypertension, diabetes, COPD, CAD, AAA, GERD with barret's esophagus, CKD stage IV ( baseline creatinine of 2.2-2.8), and right lung mass who was sent from [facility] with generalized weakness and frequent falls.. Patient was recently hospitalized for similar symptoms..recent w/up included MRI brain, CT head could not explain frequent falls and disequilibrium.CT of the abdomen and pelvis with no signs of metastatic process. Patient has persistent tremors in lower extremities and gets unsteady with frequent falls. He also reports poor appetite and weight loss in the ED patient was found to have leucocytosis and AKI on CKD with creatinine of 2.79. [facility] hospitalist admitted patient to medical floor.      Hospital Course:  generalized weakness with fall  Patient noted to have positive orthostasis and symptoms Likely due  to orthostatic drop in BP and polypharmacy .  --. He had orthostatic hypotension during earlier admission for syncope as well.  Noted that patient is on a multiple different medications Including xanax, Marinol, cymbalta, phenergan, nortriptyline, restoril for sleep, percocet and neurontin which all might be contributing to his unsteadiness. i have reduced dose of neurontin, and clonidine . Hold nortriptyline and marinol. Reduced dose of labetalol as well. -TED stockings ordered. Had neurological w/up done on recent hospitalization. Patient reports resting tremors with episodes fo falling forward on standing. He could have signs of early parkinson's and would benefit from outpt neurology evaluation if this persists. After medical adjustments made he did not have further tremors. -patient seen by PT. Wishes to return back to SNF  AKI on CKD  Patient not on any nephrotoxic agents. prerenal in etiology and improving with IV fluids.  Baseline creatinine of 2.2-2.8 with stage IV CKD. Follows with Dr Hyman Hopes who saw him one day prior to admission.  creatinine upon discharge of 1.74. Follow with renal as outpt.  Hypertension  noted for orthostatic drop in BP on 6/7 . Reduced dose of clonidine to 0.1 mg daily and labetalol to 100 mg bid. Still has positive orthostasis but BP elevated on lying sown. Will change labetalol dose to 200 mg bid added prn hydralazine. . Ordered TED stockings.   Anemia  has iron def anemia  Hx of barrett's esophagus and has had EGD and colonoscopy in April  Given 1 dose of fereheme on 6/7. Hb still 7.6.  transfused with 1 U prbc  And improved to 9.6.  Leucocytosis  No  signs of infection.   COPD  stable. continue home inhalers   Right lung mass  bx done in April and negative for malignancy   Severe protein calorie malnutrition  Continue nutrition supplements.   Iron deficiency anemia  Given a dose of IV feraheme 510 mg x 1  continue iron supplement   Diabetes  mellitus  A1C of 5.7. Continue home meds    Code Status: DNR  Family Communication: wife at bedside  Disposition: return to SNF   Consultants:  None   Procedures:  None   Antibiotics:  none   Discharge Exam: Filed Vitals:   04/10/13 0556 04/10/13 1039 04/10/13 1044 04/10/13 1049  BP: 182/76 195/90 153/89 134/90  Pulse: 81     Temp: 98.5 F (36.9 C)     TempSrc: Oral     Resp: 18     Height:      Weight:      SpO2: 97%       General: Elderly male in NAD  HEENT: pallor +, no icterus, moist mucosa  Cardiovascular: NS1&S2, no murmurs  Respiratory: clear b/l, no added sounds  Abdomen: soft, NT, ND BS+  Musculoskeletal: warm, no edema, painful ROM of left hip, no bruises  CNS: AAOX3,No tremors , no rigidity, normal motor tone and reflexes, no focal deficit.    Discharge Instructions  Discharge Orders   Future Appointments Provider Department Dept Phone   04/14/2013 10:30 AM Tresa Garter, MD [facility] [phone]   04/17/2013 10:45 AM Iva Boop, MD [facility] [phone]   05/22/2013 9:30 AM Tresa Garter, MD [facility] [phone]   04/19/2014 8:30 AM [facility] [phone]   Eat a light meal the night before the exam but please avoid gaseous foods.   Nothing to eat or drink for at least 8 hours prior to the exam. No gum chewing or smoking the morning of the exam. Please take your morning medications with small sips of water, especially blood pressure medication. If you have several vascular lab exams and will see physician, please bring a snack with you.   04/19/2014 9:00 AM Evern Bio, NP [facility] [phone]   Future Orders Complete By Expires     Compression stockings  As directed         Medication List    STOP taking these medications       cloNIDine 0.1 MG tablet  Commonly  known as:  CATAPRES     dronabinol 5 MG capsule  Commonly known as:  MARINOL     levofloxacin 750 MG tablet  Commonly known as:  LEVAQUIN     nortriptyline 10 MG capsule  Commonly known as:  PAMELOR      TAKE these medications       ALPRAZolam 0.5 MG tablet  Commonly known as:  XANAX  Take 1 tablet (0.5 mg total) by mouth 2 (two) times daily as needed for sleep or anxiety.     aspirin EC 81 MG tablet  Take 81 mg by mouth every evening.     atorvastatin 40 MG tablet  Commonly known as:  LIPITOR  Take 40 mg by mouth every morning.     calcitRIOL 0.25 MCG capsule  Commonly known as:  ROCALTROL  Take 1 capsule (0.25 mcg total) by mouth daily.     calcium-vitamin D 500-200 MG-UNIT per tablet  Commonly known as:  OSCAL WITH D  Take 1 tablet by mouth 3 (three) times daily.     cholecalciferol 1000 UNITS tablet  Commonly known as:  VITAMIN D  Take 4 tablets (4,000 Units total) by mouth daily.     diphenoxylate-atropine 2.5-0.025 MG per tablet  Commonly known as:  LOMOTIL  Take 1 tablet by mouth 4 (four) times daily as needed for diarrhea or loose stools.     DULoxetine 60 MG capsule  Commonly known as:  CYMBALTA  Take 1 capsule (60 mg total) by mouth daily.     famotidine 40 MG tablet  Commonly known as:  PEPCID  Take 1 tablet (40 mg total) by mouth 2 (two) times daily.     feeding supplement Liqd  Take 1 Container by mouth 4 (four) times daily -  with meals and at bedtime.     gabapentin 100 MG capsule  Commonly known as:  NEURONTIN  Take 1 capsule (100 mg total) by mouth 2 (two) times daily.     hydrALAZINE 25 MG tablet  Commonly known as:  APRESOLINE  Take 1 tablet (25 mg total) by mouth every 6 (six) hours as needed (SBP>=160 mmhg).     iron polysaccharides 150 MG capsule  Commonly known as:  NIFEREX  Take 150 mg by mouth 2 (two) times daily.     labetalol 200 MG tablet  Commonly known as:  NORMODYNE  Take 1 tablet (200 mg total) by mouth 2 (two) times  daily.     magnesium oxide 400 (241.3 MG) MG tablet  Commonly known as:  MAG-OX  Take 2 tablets (800 mg total) by mouth 3 (three) times daily.     multivitamin with minerals Tabs  Take 1 tablet by mouth daily.     oxyCODONE-acetaminophen 5-325 MG per tablet  Commonly known as:  PERCOCET/ROXICET  Take 1 tablet by mouth 2 (two) times daily as needed for pain.     promethazine 12.5 MG tablet  Commonly known as:  PHENERGAN  Take 12.5-25 mg by mouth every 8 (eight) hours as needed for nausea.     sodium bicarbonate 650 MG tablet  Take 1,300 mg by mouth daily.       Allergies  Allergen Reactions  . Tramadol Nausea And Vomiting  . Amlodipine Besylate     REACTION: swelling  . Benazepril Hcl     REACTION: elev K  . Penicillins     REACTION: rash, itching  . Risperidone     REACTION: loss of motor skills  . Valsartan     REACTION: elev K       Follow-up Information   Follow up with Sonda Primes, MD In 1 week.   Contact information:   [address] [place] [address] [phone]        The results of significant diagnostics from this hospitalization (including imaging, microbiology, ancillary and laboratory) are listed below for reference.    Significant Diagnostic Studies: Ct Abdomen Pelvis Wo Contrast  03/14/2013   *RADIOLOGY REPORT*  Clinical Data: Lung mass, abnormal LFTs  CT ABDOMEN AND PELVIS WITHOUT CONTRAST  Technique:  Multidetector CT imaging of the abdomen and pelvis was performed following the standard protocol without intravenous contrast.  Comparison: 12/03/2010  Findings: Lung bases are clear.  Small hiatal hernia.  Surgical clips near the GE junction.  Unenhanced liver, spleen, pancreas, and right adrenal gland are within normal limits.  Mild nodular thickening of the left adrenal  gland.  Possible tiny layering gallstone (series 2/image 22).  No associated inflammatory changes.  Left renal atrophy. Dated bilateral renal cysts  measuring up to 1.8 cm in the left lower pole.  No renal calculi or hydronephrosis.  No evidence of bowel obstruction.  Normal appendix.  4.5 x 4.9 cm infrarenal abdominal aortic aneurysm, previously measuring 3.8 x 3.8 cm, with indwelling aortobi-iliac stent. Specifically, there is increased soft tissue along the left lateral aspect of the aneurysm sac (series 2/image 30).  Please note that an endoleak cannot be excluded on unenhanced CT.  No abdominopelvic ascites.  No suspicious abdominopelvic lymphadenopathy.  Prostate is unremarkable.  Bladder is within normal limits.  Mild degenerative changes of the visualized thoracolumbar spine.  IMPRESSION: No evidence of metastatic disease in the abdomen/pelvis on unenhanced CT. Surgically, the unenhanced liver is grossly unremarkable.  Suspected tiny layering gallstone.  No associated inflammatory changes.  Left renal atrophy.  Suspected bilateral renal cysts.  4.5 x 4.9 cm infrarenal abdominal aortic aneurysm, as described above, increased since 2012 with indwelling aortobi-iliac stent.   Original Report Authenticated By: Charline Bills, M.D.   Dg Chest 1 View  03/15/2013   *RADIOLOGY REPORT*  Clinical Data: Right upper lobe mass, post biopsy.  CHEST - 1 VIEW  Comparison: 03/13/2013  Findings: Right upper lobe mass is again noted.  No pneumothorax following biopsy.  Mild hyperinflation of the lungs and interstitial prominence.  Heart is normal size.  No effusions or acute bony abnormality.  IMPRESSION: Right upper lobe mass, COPD.  No pneumothorax following biopsy.   Original Report Authenticated By: Charlett Nose, M.D.   Dg Chest 2 View  04/07/2013   *RADIOLOGY REPORT*  Clinical Data: Fatigue  CHEST - 2 VIEW  Comparison: 03/22/2013  Findings: 5.7 cm right upper lobe mass/opacity, unchanged in size, although now without definite central cavitation.  No pleural effusion or pneumothorax.  The heart is normal in size.  Degenerative changes of the visualized  thoracolumbar spine.  IMPRESSION: 5.7 cm right upper lobe mass/opacity, unchanged, although now without definite central cavitation.   Original Report Authenticated By: Charline Bills, M.D.   Dg Chest 2 View  03/22/2013   *RADIOLOGY REPORT*  Clinical Data: Fall.  Weakness.  CHEST - 2 VIEW  Comparison: 03/15/2013.  Findings: The cardiac silhouette, mediastinal and hilar contours are within normal limits and stable.  Persistent but smaller cavitary lesion in the right upper lobe.  Remote healed rib fractures noted but no acute bony findings.  IMPRESSION:  1.  Persistent but smaller cavitary right upper lobe lung lesion. 2.  No new/acute pulmonary findings. 3.  Intact bony thorax.  Remote rib fractures are noted.   Original Report Authenticated By: Rudie Meyer, M.D.   Dg Chest 2 View  03/13/2013   *RADIOLOGY REPORT*  Clinical Data: Cough and fever  CHEST - 2 VIEW  Comparison: Chest radiograph 12/04/2010  Findings: Normal cardiac silhouette.  There is a rounded mass within the right upper lobe abutting the pleural surface measuring up to 7.5 cm.  Lungs are hyperinflated.  prominent nipple shadow noted on the left.  Aortic stent graft noted on the lateral projection in the infrarenal abdominal aorta.  IMPRESSION:  Peripheral mass in the right upper lobe is concerning for neoplasm versus pneumonia.  Recommend CT thorax with contrast for further evaluation.  These results will be called to the ordering clinician or representative by the Radiologist Assistant, and communication documented in the PACS Dashboard.   Original Report Authenticated By:  Genevive Bi, M.D.   Dg Hip Complete Left  04/07/2013   *RADIOLOGY REPORT*  Clinical Data: Fall with left hip pain.  LEFT HIP - COMPLETE 2+ VIEW  Comparison: None  Findings: There is no evidence of acute fracture, subluxation or dislocation. No focal bony lesions are present. Minimal degenerative changes in both hips are present. An aorto-iliac stent graft is noted.   IMPRESSION: No evidence of acute bony abnormality.   Original Report Authenticated By: Harmon Pier, M.D.   Ct Head Wo Contrast  03/22/2013   *RADIOLOGY REPORT*  Clinical Data:  Larey Seat.  Hit head.  CT HEAD WITHOUT CONTRAST CT CERVICAL SPINE WITHOUT CONTRAST  Technique:  Multidetector CT imaging of the head and cervical spine was performed following the standard protocol without intravenous contrast.  Multiplanar CT image reconstructions of the cervical spine were also generated.  Comparison:  03/17/2013.  CT HEAD  Findings: Stable atrophy and periventricular white matter disease. No acute intracranial findings or mass lesions.  The bony structures are intact.  No acute skull or facial bone fracture.  The paranasal sinuses are clear.  IMPRESSION:  1.  No acute cranial findings.  Stable atrophy and periventricular white matter disease. 2.  No acute skull fracture.  CT CERVICAL SPINE  Findings: The sagittal reformatted images demonstrate normal alignment of the cervical vertebral bodies.  Disc spaces and vertebral bodies are maintained.  No acute bony findings or abnormal prevertebral soft tissue swelling.  The facets are normally aligned.  No facet or laminar fractures are seen. No large disc protrusions.  The neural foramen are patent.  The skull base C1 and C1-C2 articulations are maintained.  The dens is normal.  There are scattered cervical lymph nodes.  The lung apices are clear.  IMPRESSION: Normal alignment and no acute fracture.   Original Report Authenticated By: Rudie Meyer, M.D.   Ct Head Wo Contrast  03/17/2013   *RADIOLOGY REPORT*  Clinical Data: Altered mental status, lethargic, lung mass.  CT HEAD WITHOUT CONTRAST  Technique:  Contiguous axial images were obtained from the base of the skull through the vertex without contrast.  Comparison: None.  Findings: There is no evidence for acute infarction, intracranial hemorrhage, mass lesion, hydrocephalus, or extra-axial fluid.  Mild atrophy.  Moderate  chronic microvascular ischemic change. Calvarium intact.  Clear sinuses and mastoids.  Vascular calcification.  IMPRESSION: Atrophy and small vessel disease.  No visible intracranial mass lesion or stroke.  No osseous destructive changes are observed.   Original Report Authenticated By: Davonna Belling, M.D.   Ct Chest Wo Contrast  03/14/2013   *RADIOLOGY REPORT*  Clinical Data: Fever and weakness for 1 week.  Diagnosed with pneumonia 1 day ago.  CT CHEST WITHOUT CONTRAST  Technique:  Multidetector CT imaging of the chest was performed following the standard protocol without IV contrast.  Comparison: PA and lateral chest 12/04/2010 and 03/13/2013.  Findings: No pleural or pericardial effusion is identified.  There is no axillary, hilar or mediastinal lymphadenopathy.  Calcific coronary and aortic atherosclerosis is noted.  Heart size is upper normal.  The patient has a very small hiatal hernia.  There is a mass in the periphery of the posterior segment of the right upper lobe.  The lesion measures 5.4 cm cranial-caudal by 4.7 cm AP by 4.1 cm transverse.  There is an area of central necrosis within the lesion.  The mass is contiguous with the pleural of the right chest wall.  The lungs are otherwise unremarkable.  Incidentally imaged  upper abdomen demonstrates surgical clips in the central aspect of the upper abdomen as seen on prior study. Visualized intra-abdominal contents are otherwise unremarkable.  No focal bony abnormality is identified.  IMPRESSION:  1.  Right upper lobe mass contiguous with the pleura of the periphery of the posterior right upper lobe is highly suspicious for carcinoma.  No lymphadenopathy or other evidence of metastatic disease identified. 2.  Calcific coronary aortic atherosclerosis. 3.  Very small hiatal hernia.   Original Report Authenticated By: Holley Dexter, M.D.   Ct Cervical Spine Wo Contrast  03/22/2013   *RADIOLOGY REPORT*  Clinical Data:  Larey Seat.  Hit head.  CT HEAD WITHOUT  CONTRAST CT CERVICAL SPINE WITHOUT CONTRAST  Technique:  Multidetector CT imaging of the head and cervical spine was performed following the standard protocol without intravenous contrast.  Multiplanar CT image reconstructions of the cervical spine were also generated.  Comparison:  03/17/2013.  CT HEAD  Findings: Stable atrophy and periventricular white matter disease. No acute intracranial findings or mass lesions.  The bony structures are intact.  No acute skull or facial bone fracture.  The paranasal sinuses are clear.  IMPRESSION:  1.  No acute cranial findings.  Stable atrophy and periventricular white matter disease. 2.  No acute skull fracture.  CT CERVICAL SPINE  Findings: The sagittal reformatted images demonstrate normal alignment of the cervical vertebral bodies.  Disc spaces and vertebral bodies are maintained.  No acute bony findings or abnormal prevertebral soft tissue swelling.  The facets are normally aligned.  No facet or laminar fractures are seen. No large disc protrusions.  The neural foramen are patent.  The skull base C1 and C1-C2 articulations are maintained.  The dens is normal.  There are scattered cervical lymph nodes.  The lung apices are clear.  IMPRESSION: Normal alignment and no acute fracture.   Original Report Authenticated By: Rudie Meyer, M.D.   US Abdomen Complete  03/16/2013   *RADIOLOGY REPORT*  Clinical Data:  Abnormal LFTs, diabetes, hypertension, COPD, smoker, coronary artery disease post MI  ULTRASOUND ABDOMEN:  Technique:  Sonography of upper abdominal structures was performed.  Comparison:  CT abdomen and pelvis 03/14/2013  Gallbladder:  Questionable tiny 3 mm calcification versus air of the fact at gallbladder neck.  No gallbladder wall thickening, pericholecystic fluid or sonographic Murphy's sign.  Common bile duct:  5 mm diameter, normal.  Distal CBD obscured by bowel gas  Liver:  Normal appearance  IVC:  Inadequately visualized due to poor sound transmission and  body habitus  Pancreas:  Obscured by bowel gas  Spleen:  Normal appearance, 4.7 cm length  Right kidney:  11.1 cm length.  Normal morphology without mass or hydronephrosis.  Left kidney:  8.4 cm length.  Cortical thinning.  Hypoechoic focus at inferior pole 2.0 x 1.8 1.8 cm likely small peripelvic cyst, mildly complicated.  No definite shadowing calculi or hydronephrosis.  Aorta:  Normal caliber  Other:  No free fluid  IMPRESSION: Questionable gallstone versus artifact at gallbladder neck without evidence of acute cholecystitis or biliary obstruction. Mildly complicated peripelvic cyst inferior pole left kidney 2.0 cm greatest size.   Original Report Authenticated By: Ulyses Southward, M.D.   Ct Biopsy  03/15/2013   *RADIOLOGY REPORT*  Indication: Indeterminate right upper lobe partially cavitary pulmonary mass worrisome for bronchogenic carcinoma.  CT GUIDED RIGHT UPPER LOBE NODULE CORE NEEDLE BIOPSY  Comparisons: Chest CT - 03/14/2013; CT abdomen pelvis - 03/14/2013  Intravenous medications: Fentanyl 50 mcg IV; Versed  1 mg IV  Contrast: None  Sedation time: 18 minutes  Complications: None immediate  TECHNIQUE/FINDINGS:  Informed consent was obtained from the patient following an explanation of the procedure, risks, benefits and alternatives. The patient understands, agrees and consents for the procedure. All questions were addressed.  A time out was performed prior to the initiation of the procedure.  The patient was positioned in varying degrees of supine slightly LPO on the CT table and a limited chest CT was performed for procedural planning demonstrating the grossly unchanged approximate 4.1 x 4.9 cm partially cavitary mass within the right upper lobe with associated adjacent ground-glass atelectasis (image 10, series 2).  The operative site was prepped and draped in the usual sterile fashion.  Under sterile conditions and local anesthesia, a 17 gauge coaxial needle was advanced into the peripheral aspect of the  mass. Positioning was confirmed with intermittent CT fluoroscopy followed by the acquisition of 3 with an 18 gauge core needle biopsy device.  Limited post procedural chest CT was negative for pneumothorax or additional complication.  The co-axial needle was removed and hemostasis was achieved with manual compression.  A dressing was placed.  The patient tolerated the procedure well without immediate postprocedural complication.  The patient was escorted to have an upright chest radiograph.  IMPRESSION:  Technically successful CT guided core needle biopsy of indeterminate right upper lobe pulmonary mass.   Original Report Authenticated By: Tacey Ruiz, MD   Dg Foot Complete Right  03/16/2013   *RADIOLOGY REPORT*  Clinical Data: Right great toe pain.  Rule out fracture  RIGHT FOOT COMPLETE - 3+ VIEW  Comparison: None  Findings: Chronic healed fracture of the second metatarsal in the mid shaft.  No acute fracture.  Negative for arthropathy.  IMPRESSION: Chronic healed fracture second metatarsal.  No acute fracture.   Original Report Authenticated By: Janeece Riggers, M.D.    Microbiology: No results found for this or any previous visit (from the past 240 hour(s)).   Labs: Basic Metabolic Panel:  Recent Labs Lab 04/07/13 1645 04/08/13 0630 04/09/13 0400 04/10/13 0355  NA 135 137 133* 135  K 4.4 3.9 3.9 3.8  CL 95* 101 99 99  CO2 27 27 27 26   GLUCOSE 105* 96 111* 115*  BUN 34* 28* 24* 20  CREATININE 2.79* 2.51* 2.01* 1.74*  CALCIUM 8.9 8.5 8.9 9.4   Liver Function Tests:  Recent Labs Lab 04/07/13 1645  AST 28  ALT 47  ALKPHOS 245*  BILITOT 0.2*  PROT 7.0  ALBUMIN 2.6*    Recent Labs Lab 04/07/13 1645  LIPASE 17   No results found for this basename: AMMONIA,  in the last 168 hours CBC:  Recent Labs Lab 04/07/13 1645 04/08/13 0630 04/09/13 0400 04/10/13 0355  WBC 18.8* 12.6* 14.4* 13.3*  NEUTROABS 15.1*  --   --   --   HGB 8.3* 7.5* 7.6* 9.6*  HCT 27.2* 24.3* 24.6*  30.6*  MCV 93.2 94.2 93.9 92.2  PLT 370 295 333 328   Cardiac Enzymes: No results found for this basename: CKTOTAL, CKMB, CKMBINDEX, TROPONINI,  in the last 168 hours BNP: BNP (last 3 results) No results found for this basename: PROBNP,  in the last 8760 hours CBG: No results found for this basename: GLUCAP,  in the last 168 hours     Signed:  Mrytle Bento  [facility] 04/10/2013, 2:52 PM

## 2013-04-10 NOTE — Progress Notes (Signed)
Pt will be d/c to [facility] [place] this afternoon via [facility] transport. Pt / spouse are in agreement with d/c plan.  Cori Razor LCSW [phone]

## 2013-04-10 NOTE — Progress Notes (Signed)
Pt stable at time of discharge. IV removed. Discharge packet given to EMTs.

## 2013-04-10 NOTE — Care Management Note (Signed)
Cm spoke with patient and spouse at bedside concerning discharge planning. Per pt and spouse pt would like to discharge back to [facility]. CSW notified. Cm spoke earlier with patient at bedside, pt stated at time disposition to home with [facility] services.[facility] was notified of referral. [facility] to notify [facility] of pt's current change in disposition.Will continue to follow.   Roxy Manns Grason Brailsford,RN,BSN [phone]

## 2013-04-10 NOTE — Evaluation (Signed)
Occupational Therapy Evaluation Patient Details Name: [name] MRN: [id number] DOB: June 30, 1941 Today's Date: 04/10/2013 Time: 4098-1191 OT Time Calculation (min): 27 min  OT Assessment / Plan / Recommendation Clinical Impression  72 year old male with history of hypertension, diabetes, COPD, CAD, AAA, GERD with barret's esophagus,  CKD stage IV ( baseline creatinine of  2.2-2.8), and right lung mass who was sent from [facility] with generalized weakness and frequent falls.. Patient was recently hospitalized for similar symptoms..recent w/up included MRI brain, CT head could not explain frequent falls and disequilibrium.CT of the abdomen and pelvis with no signs of metastatic process.  Patient has persistent tremors in lower extremities and gets unsteady with frequent falls. Pt will benefit from skilled OT to increaase I  with ADL activity and return to PLOF    OT Assessment  Patient needs continued OT Services    Follow Up Recommendations  SNF       Equipment Recommendations  None recommended by OT       Frequency  Min 2X/week    Precautions / Restrictions Precautions Precautions: Fall       ADL  Grooming: Simulated Where Assessed - Grooming: Unsupported sitting Upper Body Bathing: Simulated;Minimal assistance Where Assessed - Upper Body Bathing: Unsupported sitting Lower Body Bathing: Simulated;Moderate assistance Where Assessed - Lower Body Bathing: Supported sit to stand Upper Body Dressing: Simulated;Minimal assistance Where Assessed - Upper Body Dressing: Unsupported sitting Lower Body Dressing: Simulated;Moderate assistance Where Assessed - Lower Body Dressing: Supported sit to Pharmacist, hospital Method: Sit to stand Toileting - Architect and Hygiene: Moderate assistance Transfers/Ambulation Related to ADLs: Pt needed max encouragement to participate in standing task as he was complaining about L LE hurting    OT Diagnosis: Generalized  weakness  OT Problem List: Decreased strength;Decreased activity tolerance;Decreased safety awareness;Impaired balance (sitting and/or standing);Pain   OT Goals ADL Goals Pt Will Perform Grooming: with supervision;Standing at sink Pt Will Transfer to Toilet: with supervision;Comfort height toilet ADL Goal: Toilet Transfer - Progress: Goal set today Pt Will Perform Toileting - Clothing Manipulation: with supervision;Standing ADL Goal: Toileting - Clothing Manipulation - Progress: Goal set today  Visit Information  Last OT Received On: 04/10/13 Assistance Needed: +1    Subjective Data  Subjective: I have been dry heaving   Prior Functioning     Home Living Entrance Stairs-Number of Steps: Pt plans to DC to [facility] if private room available         Vision/Perception Vision - History Patient Visual Report: No change from baseline   Cognition  Cognition Arousal/Alertness: Awake/alert Behavior During Therapy: WFL for tasks assessed/performed Overall Cognitive Status: Within Functional Limits for tasks assessed    Extremity/Trunk Assessment Right Upper Extremity Assessment RUE ROM/Strength/Tone: [facility] for tasks assessed Left Upper Extremity Assessment LUE ROM/Strength/Tone: WFL for tasks assessed     Mobility Bed Mobility Bed Mobility: Supine to Sit Supine to Sit: 4: Min assist Sit to Supine: 4: Min assist Details for Bed Mobility Assistance: no tremors noted today Transfers Transfers: Stand to Sit Sit to Stand: From bed;3: Mod assist Stand to Sit: 4: Min guard;To bed Details for Transfer Assistance: cues for hand placement and to control descent           End of Session  Pt left in bed with call bell with in reach and wife present and bed alarm on  GO     Graeson Nouri, [place] Kung 04/10/2013, 1:02 PM

## 2013-04-11 LAB — PROTEIN ELECTROPHORESIS, SERUM
Alpha-1-Globulin: 11.4 % — ABNORMAL HIGH (ref 2.9–4.9)
Beta 2: 7.8 % — ABNORMAL HIGH (ref 3.2–6.5)
Gamma Globulin: 10.5 % — ABNORMAL LOW (ref 11.1–18.8)
M-Spike, %: NOT DETECTED g/dL

## 2013-04-11 NOTE — Progress Notes (Signed)
Clinical Social Work Department CLINICAL SOCIAL WORK PLACEMENT NOTE 04/11/2013  Patient:  YONI, LOBOS  Account Number:  [id number] Admit date:  04/07/2013  Clinical Social Worker:  Cori Razor, LCSW  Date/time:  04/10/2013 01:22 PM  Clinical Social Work is seeking post-discharge placement for this patient at the following level of care:   SKILLED NURSING   (*CSW will update this form in Epic as items are completed)     Patient/family provided with [facility] Department of Clinical Social Work's list of facilities offering this level of care within the geographic area requested by the patient (or if unable, by the patient's family).  04/10/2013  Patient/family informed of their freedom to choose among providers that offer the needed level of care, that participate in Medicare, Medicaid or managed care program needed by the patient, have an available bed and are willing to accept the patient.    Patient/family informed of [facility]' ownership interest in [facility], as well as of the fact that they are under no obligation to receive care at this facility.  PASARR submitted to EDS on  PASARR number received from EDS on 03/23/2013  FL2 transmitted to all facilities in geographic area requested by pt/family on  04/10/2013 FL2 transmitted to all facilities within larger geographic area on   Patient informed that his/her managed care company has contracts with or will negotiate with  certain facilities, including the following:     Patient/family informed of bed offers received:  04/11/2013 Patient chooses bed at [facility] Physician recommends and patient chooses bed at    Patient to be transferred to [facility] on  04/10/2013 Patient to be transferred to facility by [facility]  The following physician request were entered in Epic:   Additional Comments:  Cori Razor LCSW [phone]

## 2013-04-14 ENCOUNTER — Ambulatory Visit: Payer: Medicare Other | Admitting: Internal Medicine

## 2013-04-17 ENCOUNTER — Ambulatory Visit: Payer: Medicare Other | Admitting: Internal Medicine

## 2013-04-21 ENCOUNTER — Telehealth: Payer: Self-pay | Admitting: *Deleted

## 2013-04-21 NOTE — Telephone Encounter (Signed)
Caller requesting order for OT services for 1/week x 1 week, 2/week x 2 weeks and 1/week x 1 week. I called Dorinda Hill back and left a detailed mess giving a verbal order for requested services.

## 2013-04-24 ENCOUNTER — Telehealth: Payer: Self-pay | Admitting: Internal Medicine

## 2013-04-24 NOTE — Telephone Encounter (Signed)
Sre is the physical therapist.  He is with [facility].  He needs order for PT for twice weekly x 4 weeks.

## 2013-04-24 NOTE — Telephone Encounter (Signed)
1. Phone note from stacy last week - states she called in an order for PT/OT - should be taken care of.  2. Diabetes - listed as a problem but he takes no insulin or any oral medications. No indications for home monitoring (strips, lancets, glucometer,ec) at this time. The can be address by Dr. [name] at next OV.  3. Percocet can cause hallucinations. Plan Stop percocet  Start tramadol 50 mg 1 or 2 tables every 6 hours as needed for pain. 180 for a months supply

## 2013-04-24 NOTE — Telephone Encounter (Signed)
Octaviano Batty, RN with [facility] called to say that she was assigned Mr. Barkan for PT, OT, and medical social worker for home health.  He has a dx of diabetes but has no glucometer, strips, lancets etc.  Please send request for these supplies to [facility]  Questions, call [phone].

## 2013-04-24 NOTE — Telephone Encounter (Signed)
Octaviano Batty, RN calling again.  States Mr. Hayashi has an appt for 05-18-13.  Family thinks that the Percocet is causing hallucinations.  Is there something else that he can take for pain?  [facility] [address]

## 2013-04-28 DIAGNOSIS — E119 Type 2 diabetes mellitus without complications: Secondary | ICD-10-CM

## 2013-04-28 DIAGNOSIS — N189 Chronic kidney disease, unspecified: Secondary | ICD-10-CM

## 2013-04-28 DIAGNOSIS — J441 Chronic obstructive pulmonary disease with (acute) exacerbation: Secondary | ICD-10-CM

## 2013-04-28 DIAGNOSIS — R222 Localized swelling, mass and lump, trunk: Secondary | ICD-10-CM

## 2013-05-01 ENCOUNTER — Telehealth: Payer: Self-pay | Admitting: *Deleted

## 2013-05-01 NOTE — Telephone Encounter (Signed)
Caller left vm to inform you when he went to visit pt his BP was 142/102 after he had came to answer the door and rested approximately 5 min. He checked it again before he left and the reading was about the same. He states the patient had no complaints. This is just an [place].

## 2013-05-08 ENCOUNTER — Ambulatory Visit (INDEPENDENT_AMBULATORY_CARE_PROVIDER_SITE_OTHER): Payer: Medicare Other | Admitting: Internal Medicine

## 2013-05-08 ENCOUNTER — Other Ambulatory Visit: Payer: Self-pay | Admitting: Internal Medicine

## 2013-05-08 ENCOUNTER — Other Ambulatory Visit (INDEPENDENT_AMBULATORY_CARE_PROVIDER_SITE_OTHER): Payer: Medicare Other

## 2013-05-08 ENCOUNTER — Telehealth: Payer: Self-pay | Admitting: *Deleted

## 2013-05-08 ENCOUNTER — Encounter: Payer: Self-pay | Admitting: Internal Medicine

## 2013-05-08 VITALS — BP 110/70 | HR 76 | Temp 98.2°F | Resp 16 | Wt 148.0 lb

## 2013-05-08 DIAGNOSIS — K2270 Barrett's esophagus without dysplasia: Secondary | ICD-10-CM

## 2013-05-08 DIAGNOSIS — D509 Iron deficiency anemia, unspecified: Secondary | ICD-10-CM

## 2013-05-08 DIAGNOSIS — I798 Other disorders of arteries, arterioles and capillaries in diseases classified elsewhere: Secondary | ICD-10-CM

## 2013-05-08 DIAGNOSIS — F329 Major depressive disorder, single episode, unspecified: Secondary | ICD-10-CM

## 2013-05-08 DIAGNOSIS — E785 Hyperlipidemia, unspecified: Secondary | ICD-10-CM

## 2013-05-08 DIAGNOSIS — Z9181 History of falling: Secondary | ICD-10-CM

## 2013-05-08 DIAGNOSIS — J189 Pneumonia, unspecified organism: Secondary | ICD-10-CM

## 2013-05-08 DIAGNOSIS — R296 Repeated falls: Secondary | ICD-10-CM

## 2013-05-08 DIAGNOSIS — E1151 Type 2 diabetes mellitus with diabetic peripheral angiopathy without gangrene: Secondary | ICD-10-CM

## 2013-05-08 DIAGNOSIS — R197 Diarrhea, unspecified: Secondary | ICD-10-CM

## 2013-05-08 DIAGNOSIS — E1159 Type 2 diabetes mellitus with other circulatory complications: Secondary | ICD-10-CM

## 2013-05-08 DIAGNOSIS — F3289 Other specified depressive episodes: Secondary | ICD-10-CM

## 2013-05-08 DIAGNOSIS — D485 Neoplasm of uncertain behavior of skin: Secondary | ICD-10-CM

## 2013-05-08 DIAGNOSIS — I129 Hypertensive chronic kidney disease with stage 1 through stage 4 chronic kidney disease, or unspecified chronic kidney disease: Secondary | ICD-10-CM

## 2013-05-08 LAB — CBC WITH DIFFERENTIAL/PLATELET
Basophils Absolute: 0 10*3/uL (ref 0.0–0.1)
Basophils Relative: 0.4 % (ref 0.0–3.0)
Eosinophils Absolute: 0.2 10*3/uL (ref 0.0–0.7)
Hemoglobin: 10.9 g/dL — ABNORMAL LOW (ref 13.0–17.0)
MCHC: 33 g/dL (ref 30.0–36.0)
MCV: 93.9 fl (ref 78.0–100.0)
Monocytes Absolute: 0.9 10*3/uL (ref 0.1–1.0)
Neutro Abs: 8.6 10*3/uL — ABNORMAL HIGH (ref 1.4–7.7)
RBC: 3.53 Mil/uL — ABNORMAL LOW (ref 4.22–5.81)
RDW: 15.5 % — ABNORMAL HIGH (ref 11.5–14.6)

## 2013-05-08 LAB — HEPATIC FUNCTION PANEL
Albumin: 3.4 g/dL — ABNORMAL LOW (ref 3.5–5.2)
Total Protein: 7 g/dL (ref 6.0–8.3)

## 2013-05-08 LAB — BASIC METABOLIC PANEL
BUN: 19 mg/dL (ref 6–23)
Chloride: 102 mEq/L (ref 96–112)
Creatinine, Ser: 2.2 mg/dL — ABNORMAL HIGH (ref 0.4–1.5)
GFR: 31.76 mL/min — ABNORMAL LOW (ref >60.00)
Glucose, Bld: 119 mg/dL — ABNORMAL HIGH (ref 70–99)
Potassium: 4.7 mEq/L (ref 3.5–5.1)

## 2013-05-08 LAB — MAGNESIUM: Magnesium: 1.6 mg/dL (ref 1.5–2.5)

## 2013-05-08 MED ORDER — TEMAZEPAM 15 MG PO CAPS: 15.0000 mg | ORAL_CAPSULE | Freq: Every evening | ORAL | Status: DC | PRN

## 2013-05-08 MED ORDER — OMEPRAZOLE 40 MG PO CPDR: 40.0000 mg | DELAYED_RELEASE_CAPSULE | Freq: Every day | ORAL | Status: DC

## 2013-05-08 MED ORDER — NORTRIPTYLINE HCL 10 MG PO CAPS: 20.0000 mg | ORAL_CAPSULE | Freq: Every day | ORAL | Status: DC

## 2013-05-08 MED ORDER — GABAPENTIN 100 MG PO CAPS: 100.0000 mg | ORAL_CAPSULE | Freq: Two times a day (BID) | ORAL | Status: DC

## 2013-05-08 MED ORDER — OXYCODONE HCL 5 MG PO TABS: 5.0000 mg | ORAL_TABLET | Freq: Two times a day (BID) | ORAL | Status: DC | PRN

## 2013-05-08 MED ORDER — LABETALOL HCL 200 MG PO TABS: 200.0000 mg | ORAL_TABLET | Freq: Three times a day (TID) | ORAL | Status: DC

## 2013-05-08 NOTE — Assessment & Plan Note (Signed)
Continue with current prescription therapy as reflected on the Med list.  

## 2013-05-08 NOTE — Assessment & Plan Note (Signed)
Labs

## 2013-05-08 NOTE — Telephone Encounter (Signed)
OK to fill both prescriptions with additional refills x5 Thank you!  

## 2013-05-08 NOTE — Telephone Encounter (Signed)
Duncan Dull from [facility] is at Mr Pesch's home his bp is 160/98 when he arrived.  After light exercises and 5 min rest it was 170/110.  Wife states he is stressed over the bills.

## 2013-05-08 NOTE — Telephone Encounter (Signed)
Rfs phoned in. [name], OT with [facility] called to give other BP readings. He states the pt's BP was 160/90 immediately after coming to the door to let him in. After he rested 5 minutes it was 170/110 and then 152/102.

## 2013-05-08 NOTE — Assessment & Plan Note (Signed)
Better  

## 2013-05-08 NOTE — Assessment & Plan Note (Signed)
Poss [facility] bx

## 2013-05-08 NOTE — Telephone Encounter (Signed)
Pt needs Rf on nortriptyline and temazepam. Please advise.

## 2013-05-08 NOTE — Telephone Encounter (Signed)
Take Normodyne 1 po tid Thx

## 2013-05-08 NOTE — Progress Notes (Signed)
Subjective:     HPI  Post-hosp visit -   Orthostatic hypotension  Renal failure (ARF), acute on chronic  Protein-calorie malnutrition, severe  COPD  DM (diabetes mellitus), type 2 with peripheral vascular complications  Right Lung mass  Leukocytosis  Failure to thrive in adult  Normocytic anemia  Tremors of nervous system  Iron deficiency anemia  Discharge Condition: fair  Diet recommendation: low sodium  Filed Weights    04/07/13 2340  04/09/13 0555   Weight:  65.772 kg (145 lb)  66.089 kg (145 lb 11.2 oz)   History of present illness:  Please refer to admission H&P for details, but in brief, 72 year old male with history of hypertension, diabetes, COPD, CAD, AAA, GERD with barret's esophagus, CKD stage IV ( baseline creatinine of 2.2-2.8), and right lung mass who was sent from [facility] with generalized weakness and frequent falls.. Patient was recently hospitalized for similar symptoms..recent w/up included MRI brain, CT head could not explain frequent falls and disequilibrium.CT of the abdomen and pelvis with no signs of metastatic process. Patient has persistent tremors in lower extremities and gets unsteady with frequent falls. He also reports poor appetite and weight loss in the ED patient was found to have leucocytosis and AKI on CKD with creatinine of 2.79. [facility] hospitalist admitted patient to medical floor.    Hospital Course:  generalized weakness with fall  Patient noted to have positive orthostasis and symptoms Likely due to orthostatic drop in BP and polypharmacy .  --. He had orthostatic hypotension during earlier admission for syncope as well.  Noted that patient is on a multiple different medications Including xanax, Marinol, cymbalta, phenergan, nortriptyline, restoril for sleep, percocet and neurontin which all might be contributing to his unsteadiness. i have reduced dose of neurontin, and clonidine . Hold nortriptyline and marinol. Reduced dose of  labetalol as well.  -TED stockings ordered.  Had neurological w/up done on recent hospitalization. Patient reports resting tremors with episodes fo falling forward on standing. He could have signs of early parkinson's and would benefit from outpt neurology evaluation if this persists. After medical adjustments made he did not have further tremors.  -patient seen by PT. Wishes to return back to SNF  AKI on CKD  Patient not on any nephrotoxic agents. prerenal in etiology and improving with IV fluids.  Baseline creatinine of 2.2-2.8 with stage IV CKD. Follows with Dr Hyman Hopes who saw him one day prior to admission.  creatinine upon discharge of 1.74. Follow with renal as outpt.  Hypertension  noted for orthostatic drop in BP on 6/7 . Reduced dose of clonidine to 0.1 mg daily and labetalol to 100 mg bid. Still has positive orthostasis but BP elevated on lying sown. Will change labetalol dose to 200 mg bid added prn hydralazine. . Ordered TED stockings.  Anemia  has iron def anemia  Hx of barrett's esophagus and has had EGD and colonoscopy in April  Given 1 dose of fereheme on 6/7. Hb still 7.6. transfused with 1 U prbc And improved to 9.6.  Leucocytosis  No signs of infection.  COPD  stable. continue home inhalers  Right lung mass  bx done in April and negative for malignancy  Severe protein calorie malnutrition  Continue nutrition supplements.  Iron deficiency anemia  Given a dose of IV feraheme 510 mg x 1  continue iron supplement  Diabetes mellitus  A1C of 5.7. Continue home meds  Code Status: DNR  Family Communication: wife at bedside  Disposition:  return to SNF      The patient presents for a follow-up of  chronic hypertension, chronic dyslipidemia, type 2 diabetes, COPD, Barrett's, CAD controlled with medicines. He had Barrett's ablation x4, one more is due. C/o lesion on L temple    Review of Systems  Constitutional: Positive for unexpected weight change. Negative for appetite  change and fatigue.  HENT: Negative for nosebleeds, congestion, sore throat, sneezing, trouble swallowing and neck pain.   Eyes: Negative for itching and visual disturbance.  Respiratory: Negative for cough.   Cardiovascular: Negative for chest pain, palpitations and leg swelling.  Gastrointestinal: Positive for diarrhea. Negative for nausea, vomiting, constipation, blood in stool, abdominal distention and rectal pain.  Genitourinary: Negative for frequency and hematuria.  Musculoskeletal: Negative for back pain, joint swelling and gait problem.  Skin: Negative for rash.  Neurological: Negative for dizziness, tremors, speech difficulty and weakness.  Psychiatric/Behavioral: Negative for sleep disturbance, dysphoric mood and agitation. The patient is not nervous/anxious.    Wt Readings from Last 3 Encounters:  05/08/13 148 lb (67.132 kg)  04/09/13 145 lb 11.2 oz (66.089 kg)  03/23/13 146 lb 9.7 oz (66.5 kg)   BP Readings from Last 3 Encounters:  05/08/13 110/70  04/10/13 169/85  03/29/13 124/53        Objective:   Physical Exam  Constitutional: He is oriented to person, place, and time. He appears well-developed.  HENT:  Head: Normocephalic and atraumatic.  Mouth/Throat: No oropharyngeal exudate.  Eyes: Conjunctivae are normal. Pupils are equal, round, and reactive to light.  Neck: Normal range of motion. No JVD present. No thyromegaly present.  Cardiovascular: Normal rate, regular rhythm, normal heart sounds and intact distal pulses.  Exam reveals no gallop and no friction rub.   No murmur heard. Pulmonary/Chest: Effort normal and breath sounds normal. No respiratory distress. He has no wheezes. He has no rales. He exhibits no tenderness.  Abdominal: Soft. Bowel sounds are normal. He exhibits no distension and no mass. There is no tenderness. There is no rebound and no guarding.  Musculoskeletal: Normal range of motion. He exhibits no edema and no tenderness.  Lymphadenopathy:     He has no cervical adenopathy.  Neurological: He is alert and oriented to person, place, and time. He has normal reflexes. No cranial nerve deficit. He exhibits normal muscle tone. Coordination normal.  Skin: Skin is warm and dry. No rash noted.  Psychiatric: He has a normal mood and affect. His behavior is normal. Judgment and thought content normal.  AKs   Lab Results  Component Value Date   WBC 13.3* 04/10/2013   HGB 9.6* 04/10/2013   HCT 30.6* 04/10/2013   PLT 328 04/10/2013   GLUCOSE 115* 04/10/2013   CHOL 154 08/11/2012   TRIG 326.0* 08/11/2012   HDL 31.40* 08/11/2012   LDLDIRECT 80.2 08/11/2012   LDLCALC 90 02/10/2011   ALT 47 04/07/2013   AST 28 04/07/2013   NA 135 04/10/2013   K 3.8 04/10/2013   CL 99 04/10/2013   CREATININE 1.74* 04/10/2013   BUN 20 04/10/2013   CO2 26 04/10/2013   TSH 1.341 04/07/2013   PSA 0.99 04/17/2010   INR 1.27 03/15/2013   HGBA1C 5.7* 04/07/2013              Assessment & Plan:

## 2013-05-08 NOTE — Assessment & Plan Note (Signed)
CBC

## 2013-05-09 NOTE — Telephone Encounter (Signed)
Pt informed

## 2013-05-14 ENCOUNTER — Encounter: Payer: Self-pay | Admitting: Internal Medicine

## 2013-05-15 ENCOUNTER — Other Ambulatory Visit: Payer: Self-pay | Admitting: Internal Medicine

## 2013-05-15 DIAGNOSIS — Z0279 Encounter for issue of other medical certificate: Secondary | ICD-10-CM

## 2013-05-22 ENCOUNTER — Telehealth: Payer: Self-pay | Admitting: *Deleted

## 2013-05-22 ENCOUNTER — Ambulatory Visit (INDEPENDENT_AMBULATORY_CARE_PROVIDER_SITE_OTHER): Payer: Medicare Other | Admitting: Internal Medicine

## 2013-05-22 ENCOUNTER — Encounter: Payer: Self-pay | Admitting: Internal Medicine

## 2013-05-22 VITALS — BP 120/90 | HR 80 | Temp 97.0°F | Resp 16 | Wt 158.0 lb

## 2013-05-22 DIAGNOSIS — C44320 Squamous cell carcinoma of skin of unspecified parts of face: Secondary | ICD-10-CM

## 2013-05-22 DIAGNOSIS — D489 Neoplasm of uncertain behavior, unspecified: Secondary | ICD-10-CM

## 2013-05-22 MED ORDER — DOXYCYCLINE HYCLATE 100 MG PO TABS: 100.0000 mg | ORAL_TABLET | Freq: Two times a day (BID) | ORAL | Status: DC

## 2013-05-22 MED ORDER — SILVER SULFADIAZINE 1 % EX CREA: TOPICAL_CREAM | Freq: Every day | CUTANEOUS | Status: DC

## 2013-05-22 NOTE — Telephone Encounter (Signed)
I called pt to check on him after his procedure today. He states his bandage had quite a bit of blood on it when he woke from his nap. He states he thinks it has stopped now and was able to sleep pretty good. I advised him to take it easy tonight and keep wound covered and call us or go to to ER if bleeding doesn't stop.

## 2013-05-22 NOTE — Telephone Encounter (Signed)
Thx

## 2013-05-22 NOTE — Progress Notes (Signed)
Patient ID: [name], male   DOB: Oct 28, 1941, 72 y.o.   MRN: [id number]   Procedure Note :     Procedure :  Skin biopsy, excisional   Indication:    Suspicious lesion c/w cancer   Risks including unsuccessful procedure , bleeding, infection, bruising, scar, a need for another complete procedure and others were explained to the patient in detail as well as the benefits. Informed consent was obtained and signed.   The patient was placed in a decubitus position.  Lesion #1 on  L temple   measuring 21x18 mm   Skin over lesion #1  was marked elliptically with 2 mm free margins and about 10-11 cm long, prepped with Betadine and alcohol  and anesthetized with 8 cc of 2% lidocaine and epinephrine, using a 25-gauge 1 inch needle. Excisional biopsy with a sterile #10 round blade was carried out in the usual fashion.There was an arterial branch feeding the lesion that started to squirt blood. I had to apply 3 ethilon 6-0 sutures  for imediate hemostasis (dissolvable sutures were n/a at the moment). The edges of the wound were undermined and closed with multiple staples. Telfa was applied with silvadene ointment.      Tolerated well. Complications - bleeding. Total blood loss 50-75 ml.      Postprocedure instructions were given

## 2013-05-24 ENCOUNTER — Other Ambulatory Visit: Payer: Self-pay | Admitting: Internal Medicine

## 2013-05-26 ENCOUNTER — Telehealth: Payer: Self-pay | Admitting: *Deleted

## 2013-05-26 NOTE — Telephone Encounter (Signed)
I spoke w/Mrs [name] - informed of bx results. [name] is doing well. We will sch appt with [facility] for a consultation Thx

## 2013-05-26 NOTE — Telephone Encounter (Signed)
Pt called requesting results from procedure on Monday.  Please advise

## 2013-05-28 ENCOUNTER — Encounter: Payer: Self-pay | Admitting: Internal Medicine

## 2013-05-28 NOTE — Addendum Note (Signed)
Addended by: Tresa Garter on: 05/28/2013 06:31 PM   Modules accepted: Orders

## 2013-05-29 ENCOUNTER — Telehealth: Payer: Self-pay | Admitting: *Deleted

## 2013-05-29 NOTE — Telephone Encounter (Signed)
I'm reffering [facility] to [facility]. Pls let me know what dr he is scheduled to see. Pls fax note/bx report Thx

## 2013-05-29 NOTE — Telephone Encounter (Signed)
Spoke with pt, he states he is going to [facility]; Dr Irene Limbo.  Visit notes, result note faxed to [phone] attn:  Dewayne Hatch

## 2013-05-29 NOTE — Telephone Encounter (Signed)
Called requesting lab results and chart notes from last visit. to be sent to [facility].

## 2013-05-31 NOTE — Telephone Encounter (Signed)
I spoke with Dr Eliezer Champagne

## 2013-06-02 ENCOUNTER — Encounter: Payer: Self-pay | Admitting: Internal Medicine

## 2013-06-02 ENCOUNTER — Ambulatory Visit (INDEPENDENT_AMBULATORY_CARE_PROVIDER_SITE_OTHER): Payer: Medicare Other | Admitting: Internal Medicine

## 2013-06-02 VITALS — BP 158/90 | HR 72 | Temp 97.1°F | Resp 16 | Wt 163.0 lb

## 2013-06-02 DIAGNOSIS — C44320 Squamous cell carcinoma of skin of unspecified parts of face: Secondary | ICD-10-CM

## 2013-06-04 DIAGNOSIS — C44320 Squamous cell carcinoma of skin of unspecified parts of face: Secondary | ICD-10-CM | POA: Insufficient documentation

## 2013-06-04 NOTE — Progress Notes (Signed)
  Subjective:    Patient ID: [name], male    DOB: 01/12/1941, 72 y.o.   MRN: [id number]  HPI  For staple removal  Review of Systems     Objective:   Physical Exam   Every other staple was removed     Assessment & Plan:   RTC on Monday

## 2013-06-04 NOTE — Assessment & Plan Note (Signed)
7/14 L temple large

## 2013-06-05 ENCOUNTER — Ambulatory Visit (INDEPENDENT_AMBULATORY_CARE_PROVIDER_SITE_OTHER): Payer: Medicare Other | Admitting: Internal Medicine

## 2013-06-05 ENCOUNTER — Encounter: Payer: Self-pay | Admitting: Internal Medicine

## 2013-06-05 VITALS — BP 110/80 | HR 60 | Temp 97.3°F | Resp 16 | Wt 161.0 lb

## 2013-06-05 DIAGNOSIS — C44320 Squamous cell carcinoma of skin of unspecified parts of face: Secondary | ICD-10-CM

## 2013-06-05 MED ORDER — FLUOXETINE HCL 10 MG PO TABS: 10.0000 mg | ORAL_TABLET | Freq: Every day | ORAL | Status: DC

## 2013-06-05 MED ORDER — OMEPRAZOLE 40 MG PO CPDR: 40.0000 mg | DELAYED_RELEASE_CAPSULE | Freq: Two times a day (BID) | ORAL | Status: DC

## 2013-06-05 NOTE — Progress Notes (Signed)
  Subjective:    Patient ID: [name], male    DOB: 08-16-1941, 72 y.o.   MRN: [id number]  HPI  For staple removal  Review of Systems     Objective:   Physical Exam   Residual staples were removed     Assessment & Plan:   Appt with Dr Park Liter  - 8/12

## 2013-06-05 NOTE — Assessment & Plan Note (Addendum)
Residual staples were removed Appt with Dr Park Liter  - 8/12

## 2013-06-07 ENCOUNTER — Other Ambulatory Visit: Payer: Self-pay | Admitting: Internal Medicine

## 2013-06-08 ENCOUNTER — Encounter: Payer: Self-pay | Admitting: Internal Medicine

## 2013-06-08 ENCOUNTER — Ambulatory Visit (INDEPENDENT_AMBULATORY_CARE_PROVIDER_SITE_OTHER): Payer: Medicare Other | Admitting: Internal Medicine

## 2013-06-08 VITALS — BP 138/80 | HR 80 | Temp 98.0°F | Resp 16 | Wt 162.0 lb

## 2013-06-08 DIAGNOSIS — C44320 Squamous cell carcinoma of skin of unspecified parts of face: Secondary | ICD-10-CM

## 2013-06-08 NOTE — Assessment & Plan Note (Signed)
See note

## 2013-06-08 NOTE — Progress Notes (Signed)
Patient ID: [name], male   DOB: Jan 09, 1941, 72 y.o.   MRN: [id number]  Subjective:    Patient ID: [name], male    DOB: Mar 29, 1941, 72 y.o.   MRN: [id number]  HPI  To check a black stitch  Review of Systems     Objective:   Physical Exam   Healed well wound: black nylon marker was left by me there deliberately or Dr Park Liter.     Assessment & Plan:   Appt with Dr Park Liter  - 8/12

## 2013-06-10 ENCOUNTER — Other Ambulatory Visit: Payer: Self-pay | Admitting: Internal Medicine

## 2013-06-19 ENCOUNTER — Encounter: Payer: Self-pay | Admitting: Internal Medicine

## 2013-06-19 ENCOUNTER — Other Ambulatory Visit: Payer: Self-pay | Admitting: Internal Medicine

## 2013-06-22 ENCOUNTER — Other Ambulatory Visit: Payer: Self-pay | Admitting: Internal Medicine

## 2013-06-22 ENCOUNTER — Other Ambulatory Visit: Payer: Self-pay | Admitting: *Deleted

## 2013-06-22 MED ORDER — OMEPRAZOLE 40 MG PO CPDR: 40.0000 mg | DELAYED_RELEASE_CAPSULE | Freq: Two times a day (BID) | ORAL | Status: DC

## 2013-06-28 ENCOUNTER — Encounter: Payer: Self-pay | Admitting: Internal Medicine

## 2013-07-10 ENCOUNTER — Ambulatory Visit: Payer: Medicare Other | Admitting: Internal Medicine

## 2013-07-22 ENCOUNTER — Encounter: Payer: Self-pay | Admitting: Internal Medicine

## 2013-07-30 ENCOUNTER — Other Ambulatory Visit: Payer: Self-pay | Admitting: Internal Medicine

## 2013-08-08 ENCOUNTER — Encounter: Payer: Self-pay | Admitting: Internal Medicine

## 2013-08-08 ENCOUNTER — Other Ambulatory Visit: Payer: Self-pay | Admitting: *Deleted

## 2013-08-08 MED ORDER — LABETALOL HCL 200 MG PO TABS: 200.0000 mg | ORAL_TABLET | Freq: Three times a day (TID) | ORAL | Status: DC

## 2013-08-08 MED ORDER — OMEPRAZOLE 40 MG PO CPDR: 40.0000 mg | DELAYED_RELEASE_CAPSULE | Freq: Two times a day (BID) | ORAL | Status: DC

## 2013-08-08 MED ORDER — FLUOXETINE HCL 10 MG PO TABS: 10.0000 mg | ORAL_TABLET | Freq: Every day | ORAL | Status: DC

## 2013-08-08 MED ORDER — NORTRIPTYLINE HCL 10 MG PO CAPS: 20.0000 mg | ORAL_CAPSULE | Freq: Every day | ORAL | Status: DC

## 2013-08-08 MED ORDER — GABAPENTIN 100 MG PO CAPS: 100.0000 mg | ORAL_CAPSULE | Freq: Two times a day (BID) | ORAL | Status: DC

## 2013-08-08 MED ORDER — ATORVASTATIN CALCIUM 40 MG PO TABS: 40.0000 mg | ORAL_TABLET | Freq: Every morning | ORAL | Status: DC

## 2013-08-08 MED ORDER — CLONIDINE HCL 0.1 MG PO TABS: 0.1000 mg | ORAL_TABLET | Freq: Two times a day (BID) | ORAL | Status: DC

## 2013-08-08 MED ORDER — CALCITRIOL 0.25 MCG PO CAPS: ORAL_CAPSULE | ORAL | Status: DC

## 2013-08-08 NOTE — Telephone Encounter (Signed)
Pt sent email requesting 90 day on his prescriptions can save him money...lmb

## 2013-08-09 ENCOUNTER — Other Ambulatory Visit: Payer: Self-pay | Admitting: Internal Medicine

## 2013-08-11 ENCOUNTER — Other Ambulatory Visit: Payer: Self-pay | Admitting: *Deleted

## 2013-08-11 DIAGNOSIS — F411 Generalized anxiety disorder: Secondary | ICD-10-CM

## 2013-08-11 MED ORDER — ALPRAZOLAM 0.5 MG PO TABS: 0.5000 mg | ORAL_TABLET | Freq: Two times a day (BID) | ORAL | Status: DC | PRN

## 2013-08-14 ENCOUNTER — Ambulatory Visit (INDEPENDENT_AMBULATORY_CARE_PROVIDER_SITE_OTHER): Payer: Medicare Other | Admitting: Internal Medicine

## 2013-08-14 ENCOUNTER — Encounter: Payer: Self-pay | Admitting: Internal Medicine

## 2013-08-14 VITALS — BP 140/84 | HR 76 | Temp 97.7°F | Resp 16 | Wt 166.0 lb

## 2013-08-14 DIAGNOSIS — R634 Abnormal weight loss: Secondary | ICD-10-CM

## 2013-08-14 DIAGNOSIS — F411 Generalized anxiety disorder: Secondary | ICD-10-CM

## 2013-08-14 DIAGNOSIS — E785 Hyperlipidemia, unspecified: Secondary | ICD-10-CM

## 2013-08-14 DIAGNOSIS — D509 Iron deficiency anemia, unspecified: Secondary | ICD-10-CM | POA: Insufficient documentation

## 2013-08-14 DIAGNOSIS — R918 Other nonspecific abnormal finding of lung field: Secondary | ICD-10-CM

## 2013-08-14 DIAGNOSIS — Z23 Encounter for immunization: Secondary | ICD-10-CM

## 2013-08-14 DIAGNOSIS — F329 Major depressive disorder, single episode, unspecified: Secondary | ICD-10-CM

## 2013-08-14 DIAGNOSIS — R222 Localized swelling, mass and lump, trunk: Secondary | ICD-10-CM

## 2013-08-14 NOTE — Assessment & Plan Note (Signed)
On iron CBC 

## 2013-08-14 NOTE — Progress Notes (Signed)
   Subjective:     HPI   F/u: Orthostatic hypotension - resolved Renal failure - CRF COPD  DM (diabetes mellitus), type 2 with peripheral vascular complications  Normocytic anemia  Tremors of nervous system  Iron deficiency anemia  Skin Ca The patient also presents for a follow-up of  chronic hypertension, chronic dyslipidemia, type 2 diabetes, COPD, Barrett's, CAD controlled with medicines. He had Barrett's ablation x4, one more is due.     Review of Systems  Constitutional: Positive for unexpected weight change. Negative for appetite change and fatigue.  HENT: Negative for congestion, nosebleeds, sneezing, sore throat and trouble swallowing.   Eyes: Negative for itching and visual disturbance.  Respiratory: Negative for cough.   Cardiovascular: Negative for chest pain, palpitations and leg swelling.  Gastrointestinal: Positive for diarrhea. Negative for nausea, vomiting, constipation, blood in stool, abdominal distention and rectal pain.  Genitourinary: Negative for frequency and hematuria.  Musculoskeletal: Negative for back pain, gait problem, joint swelling and neck pain.  Skin: Negative for rash.  Neurological: Negative for dizziness, tremors, speech difficulty and weakness.  Psychiatric/Behavioral: Negative for sleep disturbance, dysphoric mood and agitation. The patient is not nervous/anxious.    Wt Readings from Last 3 Encounters:  08/14/13 166 lb (75.297 kg)  06/08/13 162 lb (73.483 kg)  06/05/13 161 lb (73.029 kg)   BP Readings from Last 3 Encounters:  08/14/13 140/84  06/08/13 138/80  06/05/13 110/80        Objective:   Physical Exam  Constitutional: He is oriented to person, place, and time. He appears well-developed.  HENT:  Head: Normocephalic and atraumatic.  Mouth/Throat: No oropharyngeal exudate.  Eyes: Conjunctivae are normal. Pupils are equal, round, and reactive to light.  Neck: Normal range of motion. No JVD present. No thyromegaly present.   Cardiovascular: Normal rate, regular rhythm, normal heart sounds and intact distal pulses.  Exam reveals no gallop and no friction rub.   No murmur heard. Pulmonary/Chest: Effort normal and breath sounds normal. No respiratory distress. He has no wheezes. He has no rales. He exhibits no tenderness.  Abdominal: Soft. Bowel sounds are normal. He exhibits no distension and no mass. There is no tenderness. There is no rebound and no guarding.  Musculoskeletal: Normal range of motion. He exhibits no edema and no tenderness.  Lymphadenopathy:    He has no cervical adenopathy.  Neurological: He is alert and oriented to person, place, and time. He has normal reflexes. No cranial nerve deficit. He exhibits normal muscle tone. Coordination normal.  Skin: Skin is warm and dry. No rash noted.  Psychiatric: He has a normal mood and affect. His behavior is normal. Judgment and thought content normal.  AKs   Lab Results  Component Value Date   WBC 12.2* 05/08/2013   HGB 10.9* 05/08/2013   HCT 33.2* 05/08/2013   PLT 344.0 05/08/2013   GLUCOSE 119* 05/08/2013   CHOL 154 08/11/2012   TRIG 326.0* 08/11/2012   HDL 31.40* 08/11/2012   LDLDIRECT 80.2 08/11/2012   LDLCALC 90 02/10/2011   ALT 22 05/08/2013   AST 17 05/08/2013   NA 139 05/08/2013   K 4.7 05/08/2013   CL 102 05/08/2013   CREATININE 2.2* 05/08/2013   BUN 19 05/08/2013   CO2 28 05/08/2013   TSH 2.65 05/08/2013   PSA 0.99 04/17/2010   INR 1.27 03/15/2013   HGBA1C 6.1 05/08/2013              Assessment & Plan:

## 2013-08-14 NOTE — Assessment & Plan Note (Signed)
Wt Readings from Last 3 Encounters:  08/14/13 166 lb (75.297 kg)  06/08/13 162 lb (73.483 kg)  06/05/13 161 lb (73.029 kg)

## 2013-08-14 NOTE — Assessment & Plan Note (Signed)
Chronic. 

## 2013-08-14 NOTE — Assessment & Plan Note (Signed)
RUL lung mass, h/o CT guided bx 5/14

## 2013-08-14 NOTE — Assessment & Plan Note (Signed)
Continue with current prescription therapy as reflected on the Med list.  

## 2013-09-06 ENCOUNTER — Encounter: Payer: Self-pay | Admitting: Cardiovascular Disease

## 2013-09-06 ENCOUNTER — Ambulatory Visit (INDEPENDENT_AMBULATORY_CARE_PROVIDER_SITE_OTHER): Payer: Medicare Other | Admitting: Cardiovascular Disease

## 2013-09-06 VITALS — BP 152/100 | HR 72 | Ht 69.0 in | Wt 170.4 lb

## 2013-09-06 DIAGNOSIS — E785 Hyperlipidemia, unspecified: Secondary | ICD-10-CM

## 2013-09-06 DIAGNOSIS — F17200 Nicotine dependence, unspecified, uncomplicated: Secondary | ICD-10-CM

## 2013-09-06 DIAGNOSIS — I10 Essential (primary) hypertension: Secondary | ICD-10-CM

## 2013-09-06 DIAGNOSIS — Z720 Tobacco use: Secondary | ICD-10-CM

## 2013-09-06 DIAGNOSIS — I2510 Atherosclerotic heart disease of native coronary artery without angina pectoris: Secondary | ICD-10-CM

## 2013-09-06 NOTE — Progress Notes (Signed)
History of Present Illness: 72 yo male with history of CAD, HTN, HLD, DM, CRI, AAA s/p repair, tobacco abuse, GERD, benign lung mass and Barretts esophagus here today for cardiac follow up. He has been followed in the past by Dr. Juanda Chance. In 1998 he had a bare metal stent placed in the right coronary artery. His last cardiac catheterization was in 2001 at which time he had nonobstructive coronary disease. He was seen by Dr. 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 Carotid artery dopplers were negative. He has been intolerant to several medications for his blood pressure in the past. He also has chronic renal insufficiency followed by Dr. Hyman Hopes with a creatinine that is about 2.5. 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 Madilyn Fireman. 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 Stress myoview 09/19/12 with good exercise tolerance, no ischemia. Hospitalized May 2014 and June 2014 for weight loss, and workup of lung mass. Found to be benign. Palliative care consult but he tells me he rebounded and has done well.   He tells me that he is feeling well. No chest pain or SOB. No palpitations. Tolerating all meds. He has been going to [facility] for ablations of his Barretts esophagitis. He does continue to smoke about one half pack of cigarettes per day.  Primary Care Physician: Dr. [name].  Last Lipid Profile:Lipid Panel     Component Value Date/Time   CHOL 154 08/11/2012 1413   TRIG 326.0* 08/11/2012 1413   HDL 31.40* 08/11/2012 1413   CHOLHDL 5 08/11/2012 1413   VLDL 65.2* 08/11/2012 1413   LDLCALC 90 02/10/2011 0831     Past Medical History  Diagnosis Date  . Diabetes mellitus   . Hypertension   . Anxiety   . COPD (chronic obstructive pulmonary disease)   . Hyperlipemia   . GERD (gastroesophageal reflux disease)   . Barrett's esophagus with high grade dysplasia     s/p RFA  . PVD (peripheral vascular disease)     . CAD (coronary artery disease)   . Tobacco user   . Depression   . Hiatal hernia   . Myocardial infarction 12/1996  . AAA (abdominal aortic aneurysm)   . Renal insufficiency 2010    Past Surgical History  Procedure Laterality Date  . Tonsillectomy    . Nissen fundoplication    . Abdominal aortic aneurysm repair  2010    and right common iliac artery aneurysm, DR HAYES  . Upper gastrointestinal endoscopy  07/07/2011    barretts esophagus, hiatal hernia, gastritis, ablations in esophagus  . Colonoscopy  05/07/2005    diverticulosis, internal and external hemorrhoids  . Abdominal aortic aneurysm repair    . Radiofrequency ablation  multiple    Barrett's, high-grade dysplasia  . Colonoscopy N/A 03/01/2013    Procedure: COLONOSCOPY;  Surgeon: Iva Boop, MD;  Location: [facility];  Service: Endoscopy;  Laterality: N/A;    Current Outpatient Prescriptions  Medication Sig Dispense Refill  . ALPRAZolam (XANAX) 0.5 MG tablet TAKE 1 TABLET BY MOUTH TWICE A DAY AS NEEDED FOR SLEEP  60 tablet  3  . aspirin EC 81 MG tablet Take 81 mg by mouth every evening.      Marland Kitchen atorvastatin (LIPITOR) 40 MG tablet Take 1 tablet (40 mg total) by mouth every morning.  90 tablet  1  . calcium-vitamin D (OSCAL WITH D) 500-200 MG-UNIT per tablet Take 1  tablet by mouth 3 (three) times daily.  90 tablet  0  . cholecalciferol (VITAMIN D) 1000 UNITS tablet Take 4 tablets (4,000 Units total) by mouth daily.  30 tablet  0  . cloNIDine (CATAPRES) 0.1 MG tablet Take 1 tablet (0.1 mg total) by mouth 2 (two) times daily.  180 tablet  1  . diphenoxylate-atropine (LOMOTIL) 2.5-0.025 MG per tablet Take 1 tablet by mouth 4 (four) times daily as needed for diarrhea or loose stools.  60 tablet  1  . feeding supplement (RESOURCE BREEZE) LIQD Take 1 Container by mouth 4 (four) times daily -  with meals and at bedtime.  1 Container  0  . FLUoxetine (PROZAC) 10 MG tablet Take 1 tablet (10 mg total) by mouth daily.  90 tablet  1   . gabapentin (NEURONTIN) 100 MG capsule Take 1 capsule (100 mg total) by mouth 2 (two) times daily.  180 capsule  1  . glimepiride (AMARYL) 1 MG tablet As directed      . iron polysaccharides (NIFEREX) 150 MG capsule Take 150 mg by mouth 2 (two) times daily.      Marland Kitchen labetalol (NORMODYNE) 200 MG tablet Take 1 tablet (200 mg total) by mouth 3 (three) times daily.  270 tablet  1  . magnesium oxide (MAG-OX) 400 (241.3 MG) MG tablet Take 2 tablets (800 mg total) by mouth 3 (three) times daily.  90 tablet  0  . Multiple Vitamin (MULTIVITAMIN WITH MINERALS) TABS Take 1 tablet by mouth daily.      . nortriptyline (PAMELOR) 10 MG capsule Take 2 capsules (20 mg total) by mouth at bedtime.  180 capsule  1  . omeprazole (PRILOSEC) 40 MG capsule Take 1 capsule (40 mg total) by mouth 2 (two) times daily.  180 capsule  1  . promethazine (PHENERGAN) 12.5 MG tablet TAKE 1 OR 2 TABLETS BY MOUTH EVERY 8 HOURS AS NEEDED FOR NAUSEA  60 tablet  1  . sodium bicarbonate 650 MG tablet Take 1,300 mg by mouth daily.        . temazepam (RESTORIL) 15 MG capsule Take 1-2 capsules (15-30 mg total) by mouth at bedtime as needed for sleep.  60 capsule  5   No current facility-administered medications for this visit.    Allergies  Allergen Reactions  . Tramadol Nausea And Vomiting  . Amlodipine Besylate     REACTION: swelling  . Benazepril Hcl     REACTION: elev K  . Penicillins     REACTION: rash, itching  . Risperidone     REACTION: loss of motor skills  . Valsartan     REACTION: elev K    History   Social History  . Marital Status: Married    Spouse Name: N/A    Number of Children: 3  . Years of Education: N/A   Occupational History  . Retired Retail banker    Social History Main Topics  . Smoking status: Current Every Day Smoker -- 1.00 packs/day for 54 years    Types: Cigarettes  . Smokeless tobacco: Never Used  . 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 Drug Use: No  . Sexual Activity: Not  on file   Other Topics Concern  . Not on file   Social History Narrative   Regular Exercise -  NO    Family History  Problem Relation Age of Onset  . Coronary artery disease Other   . Diabetes Other   .  Asthma Other   . Mental illness Mother     dementia  . Cancer Mother   . Diabetes Mother   . Heart disease Father   . Diabetes Father   . Stomach cancer Father   . Cancer Father   . Colon cancer Neg Hx   . Esophageal cancer Neg Hx     Review of Systems:  As stated in the HPI and otherwise negative.   BP 152/100  Pulse 72  Ht 5\' 9"  (1.753 m)  Wt 170 lb 6.4 oz (77.293 kg)  BMI 25.15 kg/m2  Physical Examination: General: Well developed, well nourished, NAD HEENT: OP clear, mucus membranes moist SKIN: warm, dry. No rashes. Neuro: No focal deficits Musculoskeletal: Muscle strength 5/5 all ext Psychiatric: Mood and affect normal Neck: No JVD, no carotid bruits, no thyromegaly, no lymphadenopathy. Lungs:Clear bilaterally, no wheezes, rhonci, crackles Cardiovascular: Regular rate and rhythm. No murmurs, gallops or rubs. Abdomen:Soft. Bowel sounds present. Non-tender.  Extremities: No lower extremity edema. Pulses are 2 + in the bilateral DP/PT.  Exercise stress myoview 09/19/12: Stress Procedure: The patient performed treadmill exercise using a Bruce Protocol for 10:11 minutes. The patient stopped due to sob, leg fatigue and denied any chest pain. There were non specific ST-T wave changes and sob. Technetium 84m Sestamibi was injected at peak exercise and myocardial perfusion imaging was performed after a brief delay.  Stress ECG: No significant change from baseline ECG  QPS  Raw Data Images: Patient motion noted; appropriate software correction applied.  Stress Images: Normal homogeneous uptake in all areas of the myocardium.  Rest Images: Normal homogeneous uptake in all areas of the myocardium.  Subtraction (SDS): No evidence of ischemia.  Transient Ischemic  Dilatation (Normal <1.22): 1.00  Lung/Heart Ratio (Normal <0.45): 0.30  Quantitative Gated Spect Images  QGS EDV: 109 ml  QGS ESV: 48 ml  Impression  Exercise Capacity: Lexiscan with no exercise.  BP Response: Normal blood pressure response.  Clinical Symptoms: shortness of breath  ECG Impression: No significant ST segment change suggestive of ischemia.  Comparison with Prior Nuclear Study: No images to compare  Overall Impression: Normal stress nuclear study.  LV Ejection Fraction: 56%. LV Wall Motion: Normal Wall Motion.  Assessment and Plan:   1. CAD: Stable. He is on good medical therapy. Feeling well overall. Recent stress test November 2013 without ischemia.  NO changes in medical therapy.   2. HTN: BP has been followed in primary care. Elevated today but reports he checks it frequently at the drugstore and it is less than 130. No changes today.   3. Tobacco abuse: Smoking cessation recommended.   4. Hyperlipidemia: He is on a statin. Lipids followed by primary care

## 2013-09-06 NOTE — Patient Instructions (Signed)
Your physician wants you to follow-up in:  12 months.  You will receive a reminder letter in the mail two months in advance. If you don't receive a letter, please call our office to schedule the follow-up appointment.   

## 2013-09-08 ENCOUNTER — Encounter: Payer: Self-pay | Admitting: Internal Medicine

## 2013-09-08 ENCOUNTER — Telehealth: Payer: Self-pay

## 2013-09-08 ENCOUNTER — Other Ambulatory Visit: Payer: Self-pay

## 2013-09-08 MED ORDER — GLUCOSE BLOOD VI STRP: ORAL_STRIP | Status: DC

## 2013-09-08 MED ORDER — ONETOUCH LANCETS MISC: Status: DC

## 2013-09-08 MED ORDER — ONETOUCH ULTRA MINI W/DEVICE KIT: PACK | Status: DC

## 2013-09-08 NOTE — Telephone Encounter (Signed)
Ok thx.

## 2013-09-08 NOTE — Telephone Encounter (Signed)
Fax came in from pharmacy requesting that a new prescription was need for Accu-Chek one touch ultra 2. Pharmacy states that the meter and test strips the patient is currently on will no longer be covered. Please advise,  Thanks!

## 2013-09-13 ENCOUNTER — Encounter: Payer: Self-pay | Admitting: Internal Medicine

## 2013-09-21 ENCOUNTER — Other Ambulatory Visit: Payer: Self-pay | Admitting: Internal Medicine

## 2013-09-24 ENCOUNTER — Encounter: Payer: Self-pay | Admitting: Internal Medicine

## 2013-09-25 ENCOUNTER — Other Ambulatory Visit: Payer: Self-pay | Admitting: *Deleted

## 2013-09-26 ENCOUNTER — Encounter: Payer: Self-pay | Admitting: Internal Medicine

## 2013-09-26 NOTE — Telephone Encounter (Signed)
Pt called requesting status of refill request. Please advise.

## 2013-09-27 NOTE — Telephone Encounter (Signed)
Pt calling again requesting status of refill.  Pt states today he is both nauseated and has diarrhea.  Please advise

## 2013-09-27 NOTE — Telephone Encounter (Signed)
A user error has taken place.

## 2013-09-29 ENCOUNTER — Encounter: Payer: Self-pay | Admitting: Internal Medicine

## 2013-10-04 ENCOUNTER — Encounter: Payer: Self-pay | Admitting: Internal Medicine

## 2013-10-04 ENCOUNTER — Other Ambulatory Visit: Payer: Self-pay | Admitting: Internal Medicine

## 2013-10-07 ENCOUNTER — Other Ambulatory Visit: Payer: Self-pay | Admitting: Internal Medicine

## 2013-10-09 ENCOUNTER — Telehealth: Payer: Self-pay | Admitting: *Deleted

## 2013-10-09 MED ORDER — TEMAZEPAM 15 MG PO CAPS: 15.0000 mg | ORAL_CAPSULE | Freq: Every evening | ORAL | Status: DC | PRN

## 2013-10-09 NOTE — Telephone Encounter (Signed)
Pt is requesting Rf on Temazepam. Please advise.

## 2013-10-09 NOTE — Telephone Encounter (Signed)
Ok pls call in Thxx

## 2013-10-10 NOTE — Telephone Encounter (Signed)
Done. Left detailed mess informing pt.  

## 2013-10-11 ENCOUNTER — Other Ambulatory Visit: Payer: Self-pay

## 2013-10-11 MED ORDER — ATORVASTATIN CALCIUM 40 MG PO TABS: 40.0000 mg | ORAL_TABLET | Freq: Every morning | ORAL | Status: DC

## 2013-10-11 MED ORDER — OMEPRAZOLE 40 MG PO CPDR: 40.0000 mg | DELAYED_RELEASE_CAPSULE | Freq: Two times a day (BID) | ORAL | Status: DC

## 2013-10-11 MED ORDER — GABAPENTIN 100 MG PO CAPS: 100.0000 mg | ORAL_CAPSULE | Freq: Two times a day (BID) | ORAL | Status: DC

## 2013-10-11 MED ORDER — LABETALOL HCL 200 MG PO TABS: 200.0000 mg | ORAL_TABLET | Freq: Three times a day (TID) | ORAL | Status: DC

## 2013-10-11 MED ORDER — NORTRIPTYLINE HCL 10 MG PO CAPS: 20.0000 mg | ORAL_CAPSULE | Freq: Every day | ORAL | Status: DC

## 2013-10-11 MED ORDER — CLONIDINE HCL 0.1 MG PO TABS: 0.1000 mg | ORAL_TABLET | Freq: Two times a day (BID) | ORAL | Status: DC

## 2013-10-11 MED ORDER — FLUOXETINE HCL 10 MG PO TABS: 10.0000 mg | ORAL_TABLET | Freq: Every day | ORAL | Status: DC

## 2013-10-11 MED ORDER — GLIMEPIRIDE 1 MG PO TABS: ORAL_TABLET | ORAL | Status: DC

## 2013-10-22 ENCOUNTER — Encounter: Payer: Self-pay | Admitting: Internal Medicine

## 2013-10-29 ENCOUNTER — Encounter: Payer: Self-pay | Admitting: Internal Medicine

## 2013-11-03 ENCOUNTER — Telehealth: Payer: Self-pay | Admitting: *Deleted

## 2013-11-03 NOTE — Telephone Encounter (Signed)
   Pt is requesting a 90 day supply on temazepam sent to [facility]. Please advise. Ok? Pt is aware MD is put until 10/06/14.

## 2013-11-05 NOTE — Telephone Encounter (Signed)
OK to fill this prescription for 90 days with additional refills x1 Thank you!

## 2013-11-06 MED ORDER — TEMAZEPAM 15 MG PO CAPS: 15.0000 mg | ORAL_CAPSULE | Freq: Every evening | ORAL | Status: DC | PRN

## 2013-11-06 NOTE — Telephone Encounter (Signed)
Rx printed/pending MD sig. Will fax to [facility]

## 2013-11-07 NOTE — Telephone Encounter (Signed)
Rx faxed on 11/06/13. Pt informed

## 2013-11-09 ENCOUNTER — Telehealth: Payer: Self-pay | Admitting: *Deleted

## 2013-11-09 ENCOUNTER — Other Ambulatory Visit: Payer: Medicare Other

## 2013-11-09 DIAGNOSIS — Z0000 Encounter for general adult medical examination without abnormal findings: Secondary | ICD-10-CM

## 2013-11-09 NOTE — Telephone Encounter (Signed)
Orders for lab, per Dr. Alain Marion

## 2013-11-13 ENCOUNTER — Telehealth: Payer: Self-pay | Admitting: *Deleted

## 2013-11-13 MED ORDER — GLUCOSE BLOOD VI STRP: ORAL_STRIP | Status: DC

## 2013-11-13 NOTE — Telephone Encounter (Signed)
Fax received from pharmacy requesting to re-send rx for onetouch test strips with the dx code on the rx.  rx re-sent to pharmacy.

## 2013-11-16 ENCOUNTER — Ambulatory Visit (INDEPENDENT_AMBULATORY_CARE_PROVIDER_SITE_OTHER): Payer: Medicare Other | Admitting: Internal Medicine

## 2013-11-16 ENCOUNTER — Ambulatory Visit: Payer: Medicare Other | Admitting: Internal Medicine

## 2013-11-16 ENCOUNTER — Encounter: Payer: Self-pay | Admitting: Internal Medicine

## 2013-11-16 ENCOUNTER — Other Ambulatory Visit (INDEPENDENT_AMBULATORY_CARE_PROVIDER_SITE_OTHER): Payer: Medicare Other

## 2013-11-16 VITALS — BP 158/90 | HR 80 | Temp 96.9°F | Resp 16 | Wt 169.0 lb

## 2013-11-16 DIAGNOSIS — J189 Pneumonia, unspecified organism: Secondary | ICD-10-CM

## 2013-11-16 DIAGNOSIS — Z23 Encounter for immunization: Secondary | ICD-10-CM

## 2013-11-16 DIAGNOSIS — R634 Abnormal weight loss: Secondary | ICD-10-CM

## 2013-11-16 DIAGNOSIS — Z0000 Encounter for general adult medical examination without abnormal findings: Secondary | ICD-10-CM

## 2013-11-16 DIAGNOSIS — F411 Generalized anxiety disorder: Secondary | ICD-10-CM

## 2013-11-16 DIAGNOSIS — Z9181 History of falling: Secondary | ICD-10-CM

## 2013-11-16 DIAGNOSIS — N259 Disorder resulting from impaired renal tubular function, unspecified: Secondary | ICD-10-CM

## 2013-11-16 DIAGNOSIS — J449 Chronic obstructive pulmonary disease, unspecified: Secondary | ICD-10-CM

## 2013-11-16 DIAGNOSIS — I2510 Atherosclerotic heart disease of native coronary artery without angina pectoris: Secondary | ICD-10-CM

## 2013-11-16 DIAGNOSIS — R296 Repeated falls: Secondary | ICD-10-CM

## 2013-11-16 DIAGNOSIS — E1159 Type 2 diabetes mellitus with other circulatory complications: Secondary | ICD-10-CM

## 2013-11-16 DIAGNOSIS — E785 Hyperlipidemia, unspecified: Secondary | ICD-10-CM

## 2013-11-16 DIAGNOSIS — E1151 Type 2 diabetes mellitus with diabetic peripheral angiopathy without gangrene: Secondary | ICD-10-CM

## 2013-11-16 LAB — BASIC METABOLIC PANEL
BUN: 52 mg/dL — ABNORMAL HIGH (ref 6–23)
CHLORIDE: 113 meq/L — AB (ref 96–112)
CO2: 19 mEq/L (ref 19–32)
CREATININE: 2.3 mg/dL — AB (ref 0.4–1.5)
Calcium: 9.5 mg/dL (ref 8.4–10.5)
GFR: 29.81 mL/min — AB (ref >60.00)
Glucose, Bld: 91 mg/dL (ref 70–99)
Potassium: 3.5 mEq/L (ref 3.5–5.1)
Sodium: 140 mEq/L (ref 135–145)

## 2013-11-16 LAB — CBC
HCT: 35.6 % — ABNORMAL LOW (ref 39.0–52.0)
HEMOGLOBIN: 12 g/dL — AB (ref 13.0–17.0)
MCHC: 33.6 g/dL (ref 30.0–36.0)
MCV: 91.9 fl (ref 78.0–100.0)
Platelets: 289 10*3/uL (ref 150.0–400.0)
RBC: 3.88 Mil/uL — ABNORMAL LOW (ref 4.22–5.81)
RDW: 14.7 % — AB (ref 11.5–14.6)
WBC: 13.5 10*3/uL — AB (ref 4.5–10.5)

## 2013-11-16 LAB — HEMOGLOBIN A1C: HEMOGLOBIN A1C: 6.1 % (ref 4.6–6.5)

## 2013-11-16 LAB — TSH: TSH: 1.97 u[IU]/mL (ref 0.35–5.50)

## 2013-11-16 NOTE — Assessment & Plan Note (Signed)
Continue with current prescription therapy as reflected on the Med list.  

## 2013-11-16 NOTE — Assessment & Plan Note (Signed)
resolved 

## 2013-11-16 NOTE — Progress Notes (Signed)
   Subjective:     HPI   F/u:  Renal failure - CRF COPD  DM (diabetes mellitus), type 2 with peripheral vascular complications  Normocytic anemia  Tremors of nervous system  Iron deficiency anemia  Skin Ca The patient also presents for a follow-up of  chronic hypertension, chronic dyslipidemia, type 2 diabetes, COPD, Barrett's, CAD controlled with medicines. He had Barrett's ablation x4, one more is due.     Review of Systems  Constitutional: Positive for unexpected weight change. Negative for appetite change and fatigue.  HENT: Negative for congestion, nosebleeds, sneezing, sore throat and trouble swallowing.   Eyes: Negative for itching and visual disturbance.  Respiratory: Negative for cough.   Cardiovascular: Negative for chest pain, palpitations and leg swelling.  Gastrointestinal: Positive for diarrhea. Negative for nausea, vomiting, constipation, blood in stool, abdominal distention and rectal pain.  Genitourinary: Negative for frequency and hematuria.  Musculoskeletal: Negative for back pain, gait problem, joint swelling and neck pain.  Skin: Negative for rash.  Neurological: Negative for dizziness, tremors, speech difficulty and weakness.  Psychiatric/Behavioral: Negative for sleep disturbance, dysphoric mood and agitation. The patient is not nervous/anxious.    Wt Readings from Last 3 Encounters:  11/16/13 169 lb (76.658 kg)  09/06/13 170 lb 6.4 oz (77.293 kg)  08/14/13 166 lb (75.297 kg)   BP Readings from Last 3 Encounters:  11/16/13 158/90  09/06/13 152/100  08/14/13 140/84        Objective:   Physical Exam  Constitutional: He is oriented to person, place, and time. He appears well-developed.  HENT:  Head: Normocephalic and atraumatic.  Mouth/Throat: No oropharyngeal exudate.  Eyes: Conjunctivae are normal. Pupils are equal, round, and reactive to light.  Neck: Normal range of motion. No JVD present. No thyromegaly present.  Cardiovascular: Normal  rate, regular rhythm, normal heart sounds and intact distal pulses.  Exam reveals no gallop and no friction rub.   No murmur heard. Pulmonary/Chest: Effort normal and breath sounds normal. No respiratory distress. He has no wheezes. He has no rales. He exhibits no tenderness.  Abdominal: Soft. Bowel sounds are normal. He exhibits no distension and no mass. There is no tenderness. There is no rebound and no guarding.  Musculoskeletal: Normal range of motion. He exhibits no edema and no tenderness.  Lymphadenopathy:    He has no cervical adenopathy.  Neurological: He is alert and oriented to person, place, and time. He has normal reflexes. No cranial nerve deficit. He exhibits normal muscle tone. Coordination normal.  Skin: Skin is warm and dry. No rash noted.  Psychiatric: He has a normal mood and affect. His behavior is normal. Judgment and thought content normal.  AKs   Lab Results  Component Value Date   WBC 13.5* 11/16/2013   HGB 12.0* 11/16/2013   HCT 35.6* 11/16/2013   PLT 289.0 11/16/2013   GLUCOSE 91 11/16/2013   CHOL 154 08/11/2012   TRIG 326.0* 08/11/2012   HDL 31.40* 08/11/2012   LDLDIRECT 80.2 08/11/2012   LDLCALC 90 02/10/2011   ALT 22 05/08/2013   AST 17 05/08/2013   NA 140 11/16/2013   K 3.5 11/16/2013   CL 113* 11/16/2013   CREATININE 2.3* 11/16/2013   BUN 52* 11/16/2013   CO2 19 11/16/2013   TSH 1.97 11/16/2013   PSA 0.99 04/17/2010   INR 1.27 03/15/2013   HGBA1C 6.1 11/16/2013              Assessment & Plan:

## 2013-11-16 NOTE — Assessment & Plan Note (Signed)
Labs

## 2013-11-16 NOTE — Progress Notes (Signed)
Pre visit review using our clinic review tool, if applicable. No additional management support is needed unless otherwise documented below in the visit note. 

## 2013-11-16 NOTE — Assessment & Plan Note (Signed)
Wt Readings from Last 3 Encounters:  11/16/13 169 lb (76.658 kg)  09/06/13 170 lb 6.4 oz (77.293 kg)  08/14/13 166 lb (75.297 kg)

## 2013-11-16 NOTE — Assessment & Plan Note (Signed)
Prevnar today.

## 2013-11-16 NOTE — Progress Notes (Deleted)
   Subjective:    Patient ID: [name], male    DOB: Jan 12, 1941, 73 y.o.   MRN: [id number]  HPI    Review of Systems     Objective:   Physical Exam        Assessment & Plan:

## 2013-11-20 ENCOUNTER — Encounter: Payer: Self-pay | Admitting: Internal Medicine

## 2013-11-22 ENCOUNTER — Encounter: Payer: Self-pay | Admitting: Internal Medicine

## 2013-11-23 ENCOUNTER — Telehealth: Payer: Self-pay | Admitting: Internal Medicine

## 2013-11-23 NOTE — Telephone Encounter (Signed)
Pt wants a call about his sleep medicine.  He takes 2 per day.  They told him he could only get one month at time from [facility].    Please let him know what the status is.

## 2013-11-23 NOTE — Telephone Encounter (Signed)
Completed form faxed to number on form for Temazepam. Pt informed

## 2013-11-27 ENCOUNTER — Telehealth: Payer: Self-pay | Admitting: Internal Medicine

## 2013-11-27 NOTE — Telephone Encounter (Signed)
Janel at [facility] is calling to check the status of the rx they faxed over for the patient's diabetic testing supplies on 11/15/13. Please advise.

## 2013-11-28 ENCOUNTER — Telehealth: Payer: Self-pay | Admitting: *Deleted

## 2013-11-28 NOTE — Telephone Encounter (Signed)
No.Thx.

## 2013-11-28 NOTE — Telephone Encounter (Signed)
Temazepam 15 mg # per day is approved through 11/01/2014. See decision letter from [facility].

## 2013-11-28 NOTE — Telephone Encounter (Signed)
Do you remember these forms? I do not currently have them.

## 2013-11-29 ENCOUNTER — Telehealth: Payer: Self-pay

## 2013-11-29 NOTE — Telephone Encounter (Signed)
[facility] called and needs a verbal order for diabetic supplies  Contact Sonia Baller - [phone] ext [phone]

## 2013-11-30 NOTE — Telephone Encounter (Signed)
I called number below x 2. No answer x 2. Closing phone note.

## 2013-12-01 NOTE — Telephone Encounter (Signed)
Forms given to MD in red folder.

## 2013-12-06 ENCOUNTER — Other Ambulatory Visit: Payer: Self-pay | Admitting: Internal Medicine

## 2014-01-08 ENCOUNTER — Other Ambulatory Visit: Payer: Self-pay | Admitting: Internal Medicine

## 2014-01-12 NOTE — Telephone Encounter (Signed)
Patient phoned again requesting xana refill

## 2014-01-12 NOTE — Telephone Encounter (Signed)
Patient phoned inquiring about the status of his xanax refill (request submitted by pharmacy 01/08/14).  Last OV with PCP 11/16/13 and med last ordered 08/09/13.  Please advise.  CB# [phone]

## 2014-01-15 ENCOUNTER — Telehealth: Payer: Self-pay | Admitting: Pulmonary Disease

## 2014-01-15 NOTE — Telephone Encounter (Signed)
Called spoke with pt. He reports he was in [facility] last year in may. I advised him we already had this information in the system. Nothing further needed

## 2014-01-15 NOTE — Telephone Encounter (Signed)
Pt is calling back [phone]

## 2014-01-15 NOTE — Telephone Encounter (Signed)
lmtcb x1 for pt. 

## 2014-01-18 ENCOUNTER — Encounter: Payer: Self-pay | Admitting: Internal Medicine

## 2014-01-19 ENCOUNTER — Other Ambulatory Visit: Payer: Self-pay | Admitting: *Deleted

## 2014-01-19 ENCOUNTER — Encounter: Payer: Self-pay | Admitting: Internal Medicine

## 2014-01-19 MED ORDER — ALPRAZOLAM 0.5 MG PO TABS: ORAL_TABLET | ORAL | Status: DC

## 2014-01-19 NOTE — Telephone Encounter (Signed)
Pt sent md a email stating [facility] didn't received his alprozam. Dr. Alain Marion ok for 6 months. Generated rx md has sign fax back to [facility]...Johny Chess

## 2014-01-23 ENCOUNTER — Encounter: Payer: Self-pay | Admitting: Internal Medicine

## 2014-01-30 ENCOUNTER — Other Ambulatory Visit: Payer: Self-pay | Admitting: Internal Medicine

## 2014-02-06 ENCOUNTER — Ambulatory Visit (INDEPENDENT_AMBULATORY_CARE_PROVIDER_SITE_OTHER): Payer: Medicare Other | Admitting: Internal Medicine

## 2014-02-06 ENCOUNTER — Other Ambulatory Visit (INDEPENDENT_AMBULATORY_CARE_PROVIDER_SITE_OTHER): Payer: Medicare Other

## 2014-02-06 ENCOUNTER — Encounter: Payer: Self-pay | Admitting: Internal Medicine

## 2014-02-06 VITALS — BP 130/92 | HR 68 | Temp 98.4°F | Resp 16 | Wt 174.0 lb

## 2014-02-06 DIAGNOSIS — E1151 Type 2 diabetes mellitus with diabetic peripheral angiopathy without gangrene: Secondary | ICD-10-CM

## 2014-02-06 DIAGNOSIS — R634 Abnormal weight loss: Secondary | ICD-10-CM

## 2014-02-06 DIAGNOSIS — E1159 Type 2 diabetes mellitus with other circulatory complications: Secondary | ICD-10-CM

## 2014-02-06 DIAGNOSIS — D509 Iron deficiency anemia, unspecified: Secondary | ICD-10-CM

## 2014-02-06 DIAGNOSIS — N183 Chronic kidney disease, stage 3 unspecified: Secondary | ICD-10-CM

## 2014-02-06 DIAGNOSIS — E785 Hyperlipidemia, unspecified: Secondary | ICD-10-CM

## 2014-02-06 DIAGNOSIS — R110 Nausea: Secondary | ICD-10-CM

## 2014-02-06 LAB — HEPATIC FUNCTION PANEL
ALT: 44 U/L (ref 0–53)
AST: 26 U/L (ref 0–37)
Albumin: 3.9 g/dL (ref 3.5–5.2)
Alkaline Phosphatase: 92 U/L (ref 39–117)
BILIRUBIN DIRECT: 0 mg/dL (ref 0.0–0.3)
BILIRUBIN TOTAL: 0.4 mg/dL (ref 0.3–1.2)
TOTAL PROTEIN: 7.2 g/dL (ref 6.0–8.3)

## 2014-02-06 LAB — BASIC METABOLIC PANEL
BUN: 43 mg/dL — ABNORMAL HIGH (ref 6–23)
CALCIUM: 9.3 mg/dL (ref 8.4–10.5)
CO2: 21 meq/L (ref 19–32)
Chloride: 108 mEq/L (ref 96–112)
Creatinine, Ser: 2 mg/dL — ABNORMAL HIGH (ref 0.4–1.5)
GFR: 34.41 mL/min — AB (ref >60.00)
Glucose, Bld: 57 mg/dL — ABNORMAL LOW (ref 70–99)
Potassium: 3.6 mEq/L (ref 3.5–5.1)
SODIUM: 139 meq/L (ref 135–145)

## 2014-02-06 LAB — CBC WITH DIFFERENTIAL/PLATELET
BASOS PCT: 0.4 % (ref 0.0–3.0)
Basophils Absolute: 0.1 10*3/uL (ref 0.0–0.1)
Eosinophils Absolute: 0.5 10*3/uL (ref 0.0–0.7)
Eosinophils Relative: 4.1 % (ref 0.0–5.0)
HEMATOCRIT: 33.2 % — AB (ref 39.0–52.0)
Hemoglobin: 11.3 g/dL — ABNORMAL LOW (ref 13.0–17.0)
LYMPHS ABS: 1.8 10*3/uL (ref 0.7–4.0)
LYMPHS PCT: 15.1 % (ref 12.0–46.0)
MCHC: 34 g/dL (ref 30.0–36.0)
MCV: 93.6 fl (ref 78.0–100.0)
Monocytes Absolute: 0.9 10*3/uL (ref 0.1–1.0)
Monocytes Relative: 7.2 % (ref 3.0–12.0)
NEUTROS ABS: 9 10*3/uL — AB (ref 1.4–7.7)
Neutrophils Relative %: 73.2 % (ref 43.0–77.0)
Platelets: 247 10*3/uL (ref 150.0–400.0)
RBC: 3.54 Mil/uL — AB (ref 4.22–5.81)
RDW: 14.8 % — ABNORMAL HIGH (ref 11.5–14.6)
WBC: 12.2 10*3/uL — ABNORMAL HIGH (ref 4.5–10.5)

## 2014-02-06 LAB — TSH: TSH: 2 u[IU]/mL (ref 0.35–5.50)

## 2014-02-06 LAB — LIPASE: Lipase: 27 U/L (ref 11.0–59.0)

## 2014-02-06 LAB — HEMOGLOBIN A1C: HEMOGLOBIN A1C: 6.2 % (ref 4.6–6.5)

## 2014-02-06 MED ORDER — TEMAZEPAM 15 MG PO CAPS: 15.0000 mg | ORAL_CAPSULE | Freq: Every evening | ORAL | Status: DC | PRN

## 2014-02-06 NOTE — Assessment & Plan Note (Signed)
Labs

## 2014-02-06 NOTE — Assessment & Plan Note (Signed)
Labs On Niferex

## 2014-02-06 NOTE — Assessment & Plan Note (Signed)
2015 poss due to Mg oxide or Niferex Hold  Mg oxide

## 2014-02-06 NOTE — Assessment & Plan Note (Signed)
Continue with current prescription therapy as reflected on the Med list.  

## 2014-02-06 NOTE — Progress Notes (Signed)
   Subjective:     HPI   F/u:  Renal failure - CRF COPD  DM (diabetes mellitus), type 2 with peripheral vascular complications  Normocytic anemia  Tremors of nervous system  Iron deficiency anemia  Skin Ca The patient also presents for a follow-up of  chronic hypertension, chronic dyslipidemia, type 2 diabetes, COPD, Barrett's, CAD controlled with medicines. He had Barrett's ablation x4, one more is due.     Review of Systems  Constitutional: Positive for unexpected weight change. Negative for appetite change and fatigue.  HENT: Negative for congestion, nosebleeds, sneezing, sore throat and trouble swallowing.   Eyes: Negative for itching and visual disturbance.  Respiratory: Negative for cough.   Cardiovascular: Negative for chest pain, palpitations and leg swelling.  Gastrointestinal: Positive for diarrhea. Negative for nausea, vomiting, constipation, blood in stool, abdominal distention and rectal pain.  Genitourinary: Negative for frequency and hematuria.  Musculoskeletal: Negative for back pain, gait problem, joint swelling and neck pain.  Skin: Negative for rash.  Neurological: Negative for dizziness, tremors, speech difficulty and weakness.  Psychiatric/Behavioral: Negative for sleep disturbance, dysphoric mood and agitation. The patient is not nervous/anxious.    Wt Readings from Last 3 Encounters:  02/06/14 174 lb (78.926 kg)  11/16/13 169 lb (76.658 kg)  09/06/13 170 lb 6.4 oz (77.293 kg)   BP Readings from Last 3 Encounters:  02/06/14 130/92  11/16/13 158/90  09/06/13 152/100        Objective:   Physical Exam  Constitutional: He is oriented to person, place, and time. He appears well-developed.  HENT:  Head: Normocephalic and atraumatic.  Mouth/Throat: No oropharyngeal exudate.  Eyes: Conjunctivae are normal. Pupils are equal, round, and reactive to light.  Neck: Normal range of motion. No JVD present. No thyromegaly present.  Cardiovascular: Normal  rate, regular rhythm, normal heart sounds and intact distal pulses.  Exam reveals no gallop and no friction rub.   No murmur heard. Pulmonary/Chest: Effort normal and breath sounds normal. No respiratory distress. He has no wheezes. He has no rales. He exhibits no tenderness.  Abdominal: Soft. Bowel sounds are normal. He exhibits no distension and no mass. There is no tenderness. There is no rebound and no guarding.  Musculoskeletal: Normal range of motion. He exhibits no edema and no tenderness.  Lymphadenopathy:    He has no cervical adenopathy.  Neurological: He is alert and oriented to person, place, and time. He has normal reflexes. No cranial nerve deficit. He exhibits normal muscle tone. Coordination normal.  Skin: Skin is warm and dry. No rash noted.  Psychiatric: He has a normal mood and affect. His behavior is normal. Judgment and thought content normal.  AKs   Lab Results  Component Value Date   WBC 12.2* 02/06/2014   HGB 11.3* 02/06/2014   HCT 33.2* 02/06/2014   PLT 247.0 02/06/2014   GLUCOSE 57* 02/06/2014   CHOL 154 08/11/2012   TRIG 326.0* 08/11/2012   HDL 31.40* 08/11/2012   LDLDIRECT 80.2 08/11/2012   LDLCALC 90 02/10/2011   ALT 44 02/06/2014   AST 26 02/06/2014   NA 139 02/06/2014   K 3.6 02/06/2014   CL 108 02/06/2014   CREATININE 2.0* 02/06/2014   BUN 43* 02/06/2014   CO2 21 02/06/2014   TSH 2.00 02/06/2014   PSA 0.99 04/17/2010   INR 1.27 03/15/2013   HGBA1C 6.2 02/06/2014              Assessment & Plan:

## 2014-02-06 NOTE — Assessment & Plan Note (Signed)
Wt Readings from Last 3 Encounters:  02/06/14 174 lb (78.926 kg)  11/16/13 169 lb (76.658 kg)  09/06/13 170 lb 6.4 oz (77.293 kg)  No appetite

## 2014-02-06 NOTE — Progress Notes (Signed)
Pre visit review using our clinic review tool, if applicable. No additional management support is needed unless otherwise documented below in the visit note. 

## 2014-02-07 ENCOUNTER — Telehealth: Payer: Self-pay | Admitting: Internal Medicine

## 2014-02-07 NOTE — Telephone Encounter (Signed)
Relevant patient education assigned to patient using Emmi. ° °

## 2014-02-08 ENCOUNTER — Telehealth: Payer: Self-pay | Admitting: *Deleted

## 2014-02-08 NOTE — Telephone Encounter (Signed)
Labs are stable Thx

## 2014-02-08 NOTE — Telephone Encounter (Signed)
Pt called requesting lab results from 4.7.15.  Please advise

## 2014-02-12 NOTE — Telephone Encounter (Signed)
Spoke with pt advised of result note.   

## 2014-02-14 ENCOUNTER — Ambulatory Visit (INDEPENDENT_AMBULATORY_CARE_PROVIDER_SITE_OTHER)
Admission: RE | Admit: 2014-02-14 | Discharge: 2014-02-14 | Disposition: A | Discharge status: Home / Self Care | Payer: Medicare Other | Source: Ambulatory Visit | Attending: Pulmonary Disease | Admitting: Pulmonary Disease

## 2014-02-14 ENCOUNTER — Encounter: Payer: Self-pay | Admitting: Pulmonary Disease

## 2014-02-14 ENCOUNTER — Ambulatory Visit: Payer: Medicare Other | Admitting: Internal Medicine

## 2014-02-14 ENCOUNTER — Ambulatory Visit (INDEPENDENT_AMBULATORY_CARE_PROVIDER_SITE_OTHER): Payer: Medicare Other | Admitting: Pulmonary Disease

## 2014-02-14 VITALS — BP 142/88 | HR 79 | Temp 97.9°F | Ht 69.0 in | Wt 174.8 lb

## 2014-02-14 DIAGNOSIS — R918 Other nonspecific abnormal finding of lung field: Secondary | ICD-10-CM

## 2014-02-14 DIAGNOSIS — R222 Localized swelling, mass and lump, trunk: Secondary | ICD-10-CM

## 2014-02-14 NOTE — Assessment & Plan Note (Signed)
The patient has a history of a right lung mass first noted in may of last year, with a negative biopsy. He was treated with a course of Levaquin, but unfortunately never kept appointments for followup. He tells me that he has not had any type of imaging since that time. It is unclear whether this represented an infection such as a lung abscess, possible tuberculosis, some type of inflammatory process, or possibly a malignancy. He does have a long history of tobacco abuse. At this point, he will need a followup chest x-ray. If this is abnormal, he will need a followup CT chest. If normal, no further followup is required.

## 2014-02-14 NOTE — Patient Instructions (Addendum)
Will send for a plain chest xray, and if still abnormal, will order a scan of your chest at [facility]. If your chest xray is completely clear, then no further followup is required. Try and stop smoking.

## 2014-02-14 NOTE — Progress Notes (Signed)
   Subjective:    Patient ID: [name], male    DOB: 1941/05/09, 73 y.o.   MRN: [id number]  HPI The patient is a 73 year old male who I've been asked to see for followup of a lung mass. This was first noted in may of last year during a hospitalization, and underwent a transthoracic needle aspiration. This showed acute inflammation and acellular debris, but unfortunately no culture was sent. The patient had a quantiferon gold test done that was indeterminate.  The patient was started on Levaquin at discharge for a possible lung abscess, and unfortunately he never followed up with scheduled visits. He comes in today where he is continuing to smoke. He denies any significant cough, mucus, or chest congestion. He tells me that he has a poor appetite but has not had significant weight loss. He denies any night sweats or chills, and has had no chest discomfort. He denies any history of tuberculosis exposure.   Review of Systems  Constitutional: Positive for appetite change. Negative for fever and unexpected weight change.  HENT: Negative for congestion, dental problem, ear pain, nosebleeds, postnasal drip, rhinorrhea, sinus pressure, sneezing, sore throat and trouble swallowing.   Eyes: Negative for redness and itching.  Respiratory: Negative for cough, chest tightness, shortness of breath and wheezing.   Cardiovascular: Negative for palpitations and leg swelling.  Gastrointestinal: Negative for nausea and vomiting.  Genitourinary: Negative for dysuria.  Musculoskeletal: Negative for joint swelling.  Skin: Negative for rash.  Neurological: Negative for headaches.  Hematological: Does not bruise/bleed easily.  Psychiatric/Behavioral: Positive for dysphoric mood. The patient is not nervous/anxious.        Objective:   Physical Exam Constitutional:  Well developed, no acute distress  HENT:  Nares patent without discharge  Oropharynx without exudate, palate and uvula are normal  Eyes:   Perrla, eomi, no scleral icterus  Neck:  No JVD, no TMG  Cardiovascular:  Normal rate, regular rhythm, no rubs or gallops.  No murmurs        Intact distal pulses but diminished  Pulmonary :  Normal breath sounds, no stridor or respiratory distress   No rales, rhonchi, or wheezing  Abdominal:  Soft, nondistended, bowel sounds present.  No tenderness noted.   Musculoskeletal:  Mild lower extremity edema noted.  Lymph Nodes:  No cervical lymphadenopathy noted  Skin:  No cyanosis noted  Neurologic:  Alert, appropriate, moves all 4 extremities without obvious deficit.         Assessment & Plan:

## 2014-02-15 ENCOUNTER — Telehealth: Payer: Self-pay | Admitting: Pulmonary Disease

## 2014-02-15 NOTE — Telephone Encounter (Signed)
Notes Recorded by Kathee Delton, MD on 02/14/2014 at 5:06 PM Let pt know that his chest xray shows the large lung spot has resolved. Great news! No further followup needed, but he needs to stop smoking. ---  I spoke with patient about results and he verbalized understanding and had no questions

## 2014-03-09 ENCOUNTER — Other Ambulatory Visit: Payer: Self-pay | Admitting: Internal Medicine

## 2014-03-12 ENCOUNTER — Telehealth: Payer: Self-pay

## 2014-03-12 NOTE — Telephone Encounter (Signed)
Patient called request that MD send rx for an appetite stimulate. Thanks

## 2014-03-13 ENCOUNTER — Other Ambulatory Visit: Payer: Self-pay | Admitting: *Deleted

## 2014-03-13 MED ORDER — GLUCOSE BLOOD VI STRP: 1.0000 | ORAL_STRIP | Freq: Three times a day (TID) | Status: DC

## 2014-03-13 MED ORDER — MIRTAZAPINE 15 MG PO TABS: ORAL_TABLET | ORAL | Status: DC

## 2014-03-13 NOTE — Telephone Encounter (Signed)
Ok Remerone at dinner - 4-5 pm Thx

## 2014-03-13 NOTE — Addendum Note (Signed)
Addended by: Cassandria Anger on: 03/13/2014 08:57 AM   Modules accepted: Orders

## 2014-04-04 ENCOUNTER — Ambulatory Visit (INDEPENDENT_AMBULATORY_CARE_PROVIDER_SITE_OTHER): Payer: Medicare Other | Admitting: Internal Medicine

## 2014-04-04 ENCOUNTER — Encounter: Payer: Self-pay | Admitting: Internal Medicine

## 2014-04-04 VITALS — BP 142/68 | HR 84 | Temp 98.8°F | Wt 168.0 lb

## 2014-04-04 DIAGNOSIS — J449 Chronic obstructive pulmonary disease, unspecified: Secondary | ICD-10-CM

## 2014-04-04 DIAGNOSIS — J209 Acute bronchitis, unspecified: Secondary | ICD-10-CM | POA: Insufficient documentation

## 2014-04-04 MED ORDER — UMECLIDINIUM-VILANTEROL 62.5-25 MCG/INH IN AEPB: 1.0000 | INHALATION_SPRAY | Freq: Every day | RESPIRATORY_TRACT | Status: DC

## 2014-04-04 MED ORDER — HYDROCODONE-HOMATROPINE 5-1.5 MG/5ML PO SYRP: 5.0000 mL | ORAL_SOLUTION | Freq: Four times a day (QID) | ORAL | Status: DC | PRN

## 2014-04-04 MED ORDER — LEVOFLOXACIN 500 MG PO TABS: 500.0000 mg | ORAL_TABLET | Freq: Every day | ORAL | Status: DC

## 2014-04-04 MED ORDER — METHYLPREDNISOLONE ACETATE 80 MG/ML IJ SUSP
80.0000 mg | Freq: Once | INTRAMUSCULAR | Status: AC
  Administered 2014-04-04: 80 mg via INTRAMUSCULAR

## 2014-04-04 NOTE — Assessment & Plan Note (Signed)
Levaquin x 7 d Hydroc cough syr Rx

## 2014-04-04 NOTE — Progress Notes (Signed)
Subjective:     Cough This is a new problem. The current episode started in the past 7 days. The problem has been gradually worsening. The problem occurs constantly. The cough is non-productive. Associated symptoms include nasal congestion, shortness of breath and wheezing. Pertinent negatives include no chest pain, ear congestion, rash or sore throat. His past medical history is significant for emphysema.     F/u:  Renal failure - CRF COPD  DM (diabetes mellitus), type 2 with peripheral vascular complications  Normocytic anemia  Tremors of nervous system  Iron deficiency anemia  Skin Ca The patient also presents for a follow-up of  chronic hypertension, chronic dyslipidemia, type 2 diabetes, COPD, Barrett's, CAD controlled with medicines. He had Barrett's ablation x4, one more is due.     Review of Systems  Constitutional: Positive for unexpected weight change. Negative for appetite change and fatigue.  HENT: Negative for congestion, nosebleeds, sneezing, sore throat and trouble swallowing.   Eyes: Negative for itching and visual disturbance.  Respiratory: Positive for cough, shortness of breath and wheezing.   Cardiovascular: Negative for chest pain, palpitations and leg swelling.  Gastrointestinal: Positive for diarrhea. Negative for nausea, vomiting, constipation, blood in stool, abdominal distention and rectal pain.  Genitourinary: Negative for frequency and hematuria.  Musculoskeletal: Negative for back pain, gait problem, joint swelling and neck pain.  Skin: Negative for rash.  Neurological: Negative for dizziness, tremors, speech difficulty and weakness.  Psychiatric/Behavioral: Negative for sleep disturbance, dysphoric mood and agitation. The patient is not nervous/anxious.    Wt Readings from Last 3 Encounters:  04/04/14 168 lb (76.204 kg)  02/14/14 174 lb 12.8 oz (79.289 kg)  02/06/14 174 lb (78.926 kg)   BP Readings from Last 3 Encounters:  04/04/14 142/68   02/14/14 142/88  02/06/14 130/92        Objective:   Physical Exam  Constitutional: He is oriented to person, place, and time. He appears well-developed.  HENT:  Head: Normocephalic and atraumatic.  Mouth/Throat: No oropharyngeal exudate.  Eyes: Conjunctivae are normal. Pupils are equal, round, and reactive to light.  Neck: Normal range of motion. No JVD present. No thyromegaly present.  Cardiovascular: Normal rate, regular rhythm, normal heart sounds and intact distal pulses.  Exam reveals no gallop and no friction rub.   No murmur heard. Pulmonary/Chest: Effort normal and breath sounds normal. No respiratory distress. He has no wheezes. He has no rales. He exhibits no tenderness.  Abdominal: Soft. Bowel sounds are normal. He exhibits no distension and no mass. There is no tenderness. There is no rebound and no guarding.  Musculoskeletal: Normal range of motion. He exhibits no edema and no tenderness.  Lymphadenopathy:    He has no cervical adenopathy.  Neurological: He is alert and oriented to person, place, and time. He has normal reflexes. No cranial nerve deficit. He exhibits normal muscle tone. Coordination normal.  Skin: Skin is warm and dry. No rash noted.  Psychiatric: He has a normal mood and affect. His behavior is normal. Judgment and thought content normal.  AKs Dyspneic  I personally provided Anoro inhaler use teaching. After the teaching patient was able to demonstrate it's use effectively. All questions were answered    Lab Results  Component Value Date   WBC 12.2* 02/06/2014   HGB 11.3* 02/06/2014   HCT 33.2* 02/06/2014   PLT 247.0 02/06/2014   GLUCOSE 57* 02/06/2014   CHOL 154 08/11/2012   TRIG 326.0* 08/11/2012   HDL 31.40* 08/11/2012  LDLDIRECT 80.2 08/11/2012   LDLCALC 90 02/10/2011   ALT 44 02/06/2014   AST 26 02/06/2014   NA 139 02/06/2014   K 3.6 02/06/2014   CL 108 02/06/2014   CREATININE 2.0* 02/06/2014   BUN 43* 02/06/2014   CO2 21 02/06/2014   TSH 2.00 02/06/2014    PSA 0.99 04/17/2010   INR 1.27 03/15/2013   HGBA1C 6.2 02/06/2014              Assessment & Plan:

## 2014-04-04 NOTE — Assessment & Plan Note (Signed)
6/15 exacerbation depomedrol 80 mg IM Anoro

## 2014-04-04 NOTE — Progress Notes (Signed)
Pre visit review using our clinic review tool, if applicable. No additional management support is needed unless otherwise documented below in the visit note. 

## 2014-04-12 ENCOUNTER — Other Ambulatory Visit: Payer: Self-pay | Admitting: Internal Medicine

## 2014-04-19 ENCOUNTER — Telehealth: Payer: Self-pay | Admitting: *Deleted

## 2014-04-19 ENCOUNTER — Other Ambulatory Visit (HOSPITAL_COMMUNITY): Payer: Medicare Other

## 2014-04-19 ENCOUNTER — Ambulatory Visit: Payer: Medicare Other | Admitting: Family

## 2014-04-19 NOTE — Telephone Encounter (Signed)
PT left vm stating he can not afford the cough syrup that he was given at last OV. He is requesting a cheaper alt. Please advise.

## 2014-04-19 NOTE — Telephone Encounter (Signed)
Other Rx has codeine (can't prescribe due to allergy to Tramadol). Use OTC meds for cough Thx

## 2014-04-20 MED ORDER — MEGESTROL ACETATE 40 MG/ML PO SUSP: 400.0000 mg | Freq: Two times a day (BID) | ORAL | Status: DC

## 2014-04-20 NOTE — Telephone Encounter (Signed)
Left mess for patient to call back.  

## 2014-04-20 NOTE — Telephone Encounter (Signed)
Try Megace - see Rx Thx

## 2014-04-20 NOTE — Telephone Encounter (Signed)
Pt informed. He states he needs something to help increase his appetite. He states he only eats in the morning and has no appetite the rest of the day. What he has taken is not doing the job. Please advise.

## 2014-04-23 ENCOUNTER — Telehealth: Payer: Self-pay | Admitting: *Deleted

## 2014-04-23 MED ORDER — MEGESTROL ACETATE 40 MG/ML PO SUSP: 400.0000 mg | Freq: Two times a day (BID) | ORAL | Status: DC

## 2014-04-23 NOTE — Telephone Encounter (Signed)
Wife has been notified see duplicate msg from today 04/23/14...Johny Chess

## 2014-04-23 NOTE — Telephone Encounter (Signed)
Previous msg on [employer] top. md rx megace to help stimulate appetite. Called wife gave md recommendation. [facility].../lmb

## 2014-04-23 NOTE — Telephone Encounter (Signed)
Left msg on triage stating pt is not eating, but he is drinking the boost freeze. His mom is very ill and about to pass away. Called Remo Lipps back advise appt. She stated he has appt on 05/09/14 not able to bring him. Is there anything pt can take until he see md on 7/8...[name]

## 2014-04-23 NOTE — Telephone Encounter (Signed)
Ps see previous note - Megesterol Thx

## 2014-04-24 ENCOUNTER — Telehealth: Payer: Self-pay | Admitting: Internal Medicine

## 2014-04-24 ENCOUNTER — Other Ambulatory Visit: Payer: Self-pay | Admitting: *Deleted

## 2014-04-24 MED ORDER — NORTRIPTYLINE HCL 10 MG PO CAPS: 20.0000 mg | ORAL_CAPSULE | Freq: Every day | ORAL | Status: DC

## 2014-04-24 NOTE — Telephone Encounter (Signed)
Pt called requesting updated rx on his nortriptyline & temazepam sent to [facility]. Already sent nortriptyline pls advise on temazepam

## 2014-04-24 NOTE — Telephone Encounter (Signed)
Pt called stated that Dr. Camila Li call in [facility] but they can not pick it up due to the pharmacy need an authorization from the doctor. Please call pt.

## 2014-04-24 NOTE — Telephone Encounter (Signed)
Forwarding msg to stacy. Pt needing prior authorization...[name]

## 2014-04-25 MED ORDER — TEMAZEPAM 15 MG PO CAPS: 15.0000 mg | ORAL_CAPSULE | Freq: Every evening | ORAL | Status: DC | PRN

## 2014-04-25 NOTE — Addendum Note (Signed)
Addended by: Estell Harpin T on: 04/25/2014 08:34 AM   Modules accepted: Orders

## 2014-04-26 ENCOUNTER — Telehealth: Payer: Self-pay | Admitting: *Deleted

## 2014-04-26 MED ORDER — HYDROCODONE-HOMATROPINE 5-1.5 MG/5ML PO SYRP: 5.0000 mL | ORAL_SOLUTION | Freq: Four times a day (QID) | ORAL | Status: DC | PRN

## 2014-04-26 NOTE — Telephone Encounter (Signed)
Called pt no answer LMOM with md response. Left rx in cabinet for pick-up.Marland KitchenJohny [name]

## 2014-04-26 NOTE — Telephone Encounter (Signed)
My condolences on his mother passing. Ok to ref [employer]

## 2014-04-26 NOTE — Telephone Encounter (Signed)
Left msg on triage stating pt has a bad cough not resting at night. Wanting to get refill on the hycodan cough syrup that md rx couple weeks ago. Also wanted to inform md that his mom past last night...[name]

## 2014-04-27 ENCOUNTER — Telehealth: Payer: Self-pay | Admitting: Internal Medicine

## 2014-04-27 NOTE — Telephone Encounter (Signed)
[name] call stated that she can not get him to drink anything other than water. She is very concern and not sure what to do. Pt request phone call from the assistant.

## 2014-04-27 NOTE — Telephone Encounter (Signed)
[name] called back to say he is drinking boost.

## 2014-04-29 ENCOUNTER — Encounter (HOSPITAL_COMMUNITY): Payer: Self-pay | Admitting: Emergency Medicine

## 2014-04-29 ENCOUNTER — Inpatient Hospital Stay (HOSPITAL_COMMUNITY)
Admission: EM | Admit: 2014-04-29 | Discharge: 2014-05-03 | DRG: 177 | Disposition: A | Discharge status: Home Health | Payer: Medicare Other | Attending: Internal Medicine | Admitting: Internal Medicine

## 2014-04-29 ENCOUNTER — Inpatient Hospital Stay (HOSPITAL_COMMUNITY): Payer: Medicare Other

## 2014-04-29 ENCOUNTER — Emergency Department (HOSPITAL_COMMUNITY): Payer: Medicare Other

## 2014-04-29 DIAGNOSIS — E1151 Type 2 diabetes mellitus with diabetic peripheral angiopathy without gangrene: Secondary | ICD-10-CM | POA: Diagnosis present

## 2014-04-29 DIAGNOSIS — J189 Pneumonia, unspecified organism: Secondary | ICD-10-CM | POA: Diagnosis present

## 2014-04-29 DIAGNOSIS — R627 Adult failure to thrive: Secondary | ICD-10-CM | POA: Diagnosis present

## 2014-04-29 DIAGNOSIS — J690 Pneumonitis due to inhalation of food and vomit: Principal | ICD-10-CM | POA: Diagnosis present

## 2014-04-29 DIAGNOSIS — E43 Unspecified severe protein-calorie malnutrition: Secondary | ICD-10-CM | POA: Diagnosis present

## 2014-04-29 DIAGNOSIS — J449 Chronic obstructive pulmonary disease, unspecified: Secondary | ICD-10-CM

## 2014-04-29 DIAGNOSIS — N179 Acute kidney failure, unspecified: Secondary | ICD-10-CM | POA: Diagnosis present

## 2014-04-29 DIAGNOSIS — N183 Chronic kidney disease, stage 3 unspecified: Secondary | ICD-10-CM | POA: Diagnosis present

## 2014-04-29 DIAGNOSIS — R740 Nonspecific elevation of levels of transaminase and lactic acid dehydrogenase [LDH]: Secondary | ICD-10-CM

## 2014-04-29 DIAGNOSIS — E872 Acidosis, unspecified: Secondary | ICD-10-CM | POA: Diagnosis present

## 2014-04-29 DIAGNOSIS — F329 Major depressive disorder, single episode, unspecified: Secondary | ICD-10-CM | POA: Diagnosis present

## 2014-04-29 DIAGNOSIS — D638 Anemia in other chronic diseases classified elsewhere: Secondary | ICD-10-CM | POA: Diagnosis present

## 2014-04-29 DIAGNOSIS — J439 Emphysema, unspecified: Secondary | ICD-10-CM | POA: Diagnosis present

## 2014-04-29 DIAGNOSIS — Z66 Do not resuscitate: Secondary | ICD-10-CM | POA: Diagnosis present

## 2014-04-29 DIAGNOSIS — K449 Diaphragmatic hernia without obstruction or gangrene: Secondary | ICD-10-CM | POA: Diagnosis present

## 2014-04-29 DIAGNOSIS — E875 Hyperkalemia: Secondary | ICD-10-CM | POA: Diagnosis present

## 2014-04-29 DIAGNOSIS — I798 Other disorders of arteries, arterioles and capillaries in diseases classified elsewhere: Secondary | ICD-10-CM | POA: Diagnosis present

## 2014-04-29 DIAGNOSIS — J208 Acute bronchitis due to other specified organisms: Secondary | ICD-10-CM

## 2014-04-29 DIAGNOSIS — Z833 Family history of diabetes mellitus: Secondary | ICD-10-CM

## 2014-04-29 DIAGNOSIS — D509 Iron deficiency anemia, unspecified: Secondary | ICD-10-CM | POA: Diagnosis present

## 2014-04-29 DIAGNOSIS — R630 Anorexia: Secondary | ICD-10-CM

## 2014-04-29 DIAGNOSIS — I2510 Atherosclerotic heart disease of native coronary artery without angina pectoris: Secondary | ICD-10-CM | POA: Diagnosis present

## 2014-04-29 DIAGNOSIS — E1129 Type 2 diabetes mellitus with other diabetic kidney complication: Secondary | ICD-10-CM | POA: Diagnosis present

## 2014-04-29 DIAGNOSIS — R634 Abnormal weight loss: Secondary | ICD-10-CM

## 2014-04-29 DIAGNOSIS — E1159 Type 2 diabetes mellitus with other circulatory complications: Secondary | ICD-10-CM | POA: Diagnosis present

## 2014-04-29 DIAGNOSIS — R918 Other nonspecific abnormal finding of lung field: Secondary | ICD-10-CM | POA: Diagnosis present

## 2014-04-29 DIAGNOSIS — N289 Disorder of kidney and ureter, unspecified: Secondary | ICD-10-CM | POA: Diagnosis present

## 2014-04-29 DIAGNOSIS — R945 Abnormal results of liver function studies: Secondary | ICD-10-CM | POA: Diagnosis present

## 2014-04-29 DIAGNOSIS — D72829 Elevated white blood cell count, unspecified: Secondary | ICD-10-CM

## 2014-04-29 DIAGNOSIS — G589 Mononeuropathy, unspecified: Secondary | ICD-10-CM | POA: Diagnosis present

## 2014-04-29 DIAGNOSIS — F17200 Nicotine dependence, unspecified, uncomplicated: Secondary | ICD-10-CM | POA: Diagnosis present

## 2014-04-29 DIAGNOSIS — Z880 Allergy status to penicillin: Secondary | ICD-10-CM

## 2014-04-29 DIAGNOSIS — D649 Anemia, unspecified: Secondary | ICD-10-CM | POA: Diagnosis present

## 2014-04-29 DIAGNOSIS — I252 Old myocardial infarction: Secondary | ICD-10-CM

## 2014-04-29 DIAGNOSIS — N189 Chronic kidney disease, unspecified: Secondary | ICD-10-CM

## 2014-04-29 DIAGNOSIS — Z7982 Long term (current) use of aspirin: Secondary | ICD-10-CM

## 2014-04-29 DIAGNOSIS — N62 Hypertrophy of breast: Secondary | ICD-10-CM | POA: Diagnosis present

## 2014-04-29 DIAGNOSIS — R7402 Elevation of levels of lactic acid dehydrogenase (LDH): Secondary | ICD-10-CM | POA: Diagnosis present

## 2014-04-29 DIAGNOSIS — I129 Hypertensive chronic kidney disease with stage 1 through stage 4 chronic kidney disease, or unspecified chronic kidney disease: Secondary | ICD-10-CM | POA: Diagnosis present

## 2014-04-29 DIAGNOSIS — F3289 Other specified depressive episodes: Secondary | ICD-10-CM | POA: Diagnosis present

## 2014-04-29 DIAGNOSIS — R7989 Other specified abnormal findings of blood chemistry: Secondary | ICD-10-CM | POA: Diagnosis present

## 2014-04-29 DIAGNOSIS — N058 Unspecified nephritic syndrome with other morphologic changes: Secondary | ICD-10-CM | POA: Diagnosis present

## 2014-04-29 DIAGNOSIS — E860 Dehydration: Secondary | ICD-10-CM

## 2014-04-29 DIAGNOSIS — J4489 Other specified chronic obstructive pulmonary disease: Secondary | ICD-10-CM | POA: Diagnosis present

## 2014-04-29 DIAGNOSIS — R7401 Elevation of levels of liver transaminase levels: Secondary | ICD-10-CM | POA: Diagnosis present

## 2014-04-29 DIAGNOSIS — Z6826 Body mass index (BMI) 26.0-26.9, adult: Secondary | ICD-10-CM

## 2014-04-29 DIAGNOSIS — Z800 Family history of malignant neoplasm of digestive organs: Secondary | ICD-10-CM

## 2014-04-29 DIAGNOSIS — Z79899 Other long term (current) drug therapy: Secondary | ICD-10-CM

## 2014-04-29 DIAGNOSIS — Z8249 Family history of ischemic heart disease and other diseases of the circulatory system: Secondary | ICD-10-CM

## 2014-04-29 LAB — COMPREHENSIVE METABOLIC PANEL
ALT: 131 U/L — ABNORMAL HIGH (ref 0–53)
AST: 102 U/L — ABNORMAL HIGH (ref 0–37)
Albumin: 2.1 g/dL — ABNORMAL LOW (ref 3.5–5.2)
Alkaline Phosphatase: 594 U/L — ABNORMAL HIGH (ref 39–117)
BUN: 68 mg/dL — ABNORMAL HIGH (ref 6–23)
CO2: 17 meq/L — AB (ref 19–32)
CREATININE: 2.6 mg/dL — AB (ref 0.50–1.35)
Calcium: 9.5 mg/dL (ref 8.4–10.5)
Chloride: 100 mEq/L (ref 96–112)
GFR calc Af Amer: 27 mL/min — ABNORMAL LOW (ref >90)
GFR, EST NON AFRICAN AMERICAN: 23 mL/min — AB (ref >90)
Glucose, Bld: 106 mg/dL — ABNORMAL HIGH (ref 70–99)
Potassium: 3.8 mEq/L (ref 3.7–5.3)
Sodium: 136 mEq/L — ABNORMAL LOW (ref 137–147)
TOTAL PROTEIN: 7.2 g/dL (ref 6.0–8.3)
Total Bilirubin: 0.2 mg/dL — ABNORMAL LOW (ref 0.3–1.2)

## 2014-04-29 LAB — GLUCOSE, CAPILLARY
Glucose-Capillary: 139 mg/dL — ABNORMAL HIGH (ref 70–99)
Glucose-Capillary: 106 mg/dL — ABNORMAL HIGH (ref 70–99)

## 2014-04-29 LAB — CBC
HCT: 26.7 % — ABNORMAL LOW (ref 39.0–52.0)
HEMOGLOBIN: 8.7 g/dL — AB (ref 13.0–17.0)
MCH: 29.7 pg (ref 26.0–34.0)
MCHC: 32.6 g/dL (ref 30.0–36.0)
MCV: 91.1 fL (ref 78.0–100.0)
Platelets: 474 10*3/uL — ABNORMAL HIGH (ref 150–400)
RBC: 2.93 MIL/uL — ABNORMAL LOW (ref 4.22–5.81)
RDW: 14.6 % (ref 11.5–15.5)
WBC: 20.2 10*3/uL — ABNORMAL HIGH (ref 4.0–10.5)

## 2014-04-29 LAB — URINALYSIS, ROUTINE W REFLEX MICROSCOPIC
Bilirubin Urine: NEGATIVE
Glucose, UA: NEGATIVE mg/dL
HGB URINE DIPSTICK: NEGATIVE
Ketones, ur: NEGATIVE mg/dL
LEUKOCYTES UA: NEGATIVE
Nitrite: NEGATIVE
PROTEIN: 30 mg/dL — AB
SPECIFIC GRAVITY, URINE: 1.014 (ref 1.005–1.030)
UROBILINOGEN UA: 0.2 mg/dL (ref 0.0–1.0)
pH: 5.5 (ref 5.0–8.0)

## 2014-04-29 LAB — I-STAT CHEM 8, ED
BUN: 94 mg/dL — ABNORMAL HIGH (ref 6–23)
CREATININE: 3 mg/dL — AB (ref 0.50–1.35)
Calcium, Ion: 1.17 mmol/L (ref 1.13–1.30)
Chloride: 105 mEq/L (ref 96–112)
Glucose, Bld: 144 mg/dL — ABNORMAL HIGH (ref 70–99)
HEMATOCRIT: 29 % — AB (ref 39.0–52.0)
HEMOGLOBIN: 9.9 g/dL — AB (ref 13.0–17.0)
POTASSIUM: 6 meq/L — AB (ref 3.7–5.3)
SODIUM: 134 meq/L — AB (ref 137–147)
TCO2: 20 mmol/L (ref 0–100)

## 2014-04-29 LAB — MAGNESIUM: Magnesium: 2 mg/dL (ref 1.5–2.5)

## 2014-04-29 LAB — URINE MICROSCOPIC-ADD ON: Urine-Other: NONE SEEN

## 2014-04-29 LAB — PHOSPHORUS: Phosphorus: 3.6 mg/dL (ref 2.3–4.6)

## 2014-04-29 LAB — POC OCCULT BLOOD, ED: Fecal Occult Bld: NEGATIVE

## 2014-04-29 LAB — CBG MONITORING, ED: GLUCOSE-CAPILLARY: 145 mg/dL — AB (ref 70–99)

## 2014-04-29 MED ORDER — MIRTAZAPINE 15 MG PO TABS
15.0000 mg | ORAL_TABLET | Freq: Every day | ORAL | Status: DC
  Administered 2014-04-29 – 2014-05-02 (×4): 15 mg via ORAL
  Filled 2014-04-29 (×6): qty 1

## 2014-04-29 MED ORDER — DEXTROSE-NACL 5-0.45 % IV SOLN
INTRAVENOUS | Status: AC
  Administered 2014-04-29: 14:00:00 via INTRAVENOUS

## 2014-04-29 MED ORDER — HEPARIN SODIUM (PORCINE) 5000 UNIT/ML IJ SOLN
5000.0000 [IU] | Freq: Three times a day (TID) | INTRAMUSCULAR | Status: DC
  Administered 2014-04-29 – 2014-05-03 (×11): 5000 [IU] via SUBCUTANEOUS
  Filled 2014-04-29 (×14): qty 1

## 2014-04-29 MED ORDER — PANTOPRAZOLE SODIUM 40 MG PO TBEC
40.0000 mg | DELAYED_RELEASE_TABLET | Freq: Every day | ORAL | Status: DC
  Administered 2014-04-30 – 2014-05-03 (×4): 40 mg via ORAL
  Filled 2014-04-29 (×4): qty 1

## 2014-04-29 MED ORDER — NORTRIPTYLINE HCL 10 MG PO CAPS
20.0000 mg | ORAL_CAPSULE | Freq: Every day | ORAL | Status: DC
  Administered 2014-04-29 – 2014-05-02 (×4): 20 mg via ORAL
  Filled 2014-04-29 (×5): qty 2

## 2014-04-29 MED ORDER — TEMAZEPAM 15 MG PO CAPS
15.0000 mg | ORAL_CAPSULE | Freq: Every evening | ORAL | Status: DC | PRN
  Administered 2014-04-30 – 2014-05-02 (×3): 15 mg via ORAL
  Filled 2014-04-29 (×3): qty 1

## 2014-04-29 MED ORDER — LEVOFLOXACIN 750 MG PO TABS
750.0000 mg | ORAL_TABLET | ORAL | Status: DC
  Administered 2014-05-01 – 2014-05-03 (×2): 750 mg via ORAL
  Filled 2014-04-29 (×2): qty 1

## 2014-04-29 MED ORDER — IMIPENEM-CILASTATIN 250 MG IV SOLR
250.0000 mg | Freq: Four times a day (QID) | INTRAVENOUS | Status: DC
  Administered 2014-04-29 – 2014-05-02 (×10): 250 mg via INTRAVENOUS
  Filled 2014-04-29 (×12): qty 250

## 2014-04-29 MED ORDER — BOOST / RESOURCE BREEZE PO LIQD
1.0000 | Freq: Three times a day (TID) | ORAL | Status: DC
  Administered 2014-04-29 – 2014-04-30 (×3): 1 via ORAL

## 2014-04-29 MED ORDER — ALPRAZOLAM 0.5 MG PO TABS
0.5000 mg | ORAL_TABLET | Freq: Two times a day (BID) | ORAL | Status: DC | PRN
  Administered 2014-04-30: 0.5 mg via ORAL
  Filled 2014-04-29: qty 1

## 2014-04-29 MED ORDER — ACETAMINOPHEN 325 MG PO TABS
650.0000 mg | ORAL_TABLET | Freq: Four times a day (QID) | ORAL | Status: DC | PRN
  Administered 2014-04-29 – 2014-05-01 (×3): 650 mg via ORAL
  Filled 2014-04-29 (×2): qty 2

## 2014-04-29 MED ORDER — VITAMIN D3 25 MCG (1000 UNIT) PO TABS
4000.0000 [IU] | ORAL_TABLET | Freq: Every morning | ORAL | Status: DC
  Administered 2014-04-30 – 2014-05-03 (×4): 4000 [IU] via ORAL
  Filled 2014-04-29 (×4): qty 4

## 2014-04-29 MED ORDER — LABETALOL HCL 200 MG PO TABS
200.0000 mg | ORAL_TABLET | Freq: Three times a day (TID) | ORAL | Status: DC
  Administered 2014-04-29 – 2014-05-03 (×12): 200 mg via ORAL
  Filled 2014-04-29 (×14): qty 1

## 2014-04-29 MED ORDER — LEVOFLOXACIN IN D5W 750 MG/150ML IV SOLN: 750.0000 mg | INTRAVENOUS | Status: DC

## 2014-04-29 MED ORDER — INSULIN ASPART 100 UNIT/ML ~~LOC~~ SOLN
0.0000 [IU] | Freq: Three times a day (TID) | SUBCUTANEOUS | Status: DC
  Administered 2014-04-30: 2 [IU] via SUBCUTANEOUS
  Administered 2014-05-01 – 2014-05-02 (×3): 1 [IU] via SUBCUTANEOUS

## 2014-04-29 MED ORDER — MEGESTROL ACETATE 40 MG PO TABS
40.0000 mg | ORAL_TABLET | Freq: Two times a day (BID) | ORAL | Status: DC
  Administered 2014-04-29 – 2014-05-03 (×8): 40 mg via ORAL
  Filled 2014-04-29 (×9): qty 1

## 2014-04-29 MED ORDER — GABAPENTIN 100 MG PO CAPS
100.0000 mg | ORAL_CAPSULE | Freq: Two times a day (BID) | ORAL | Status: DC
  Administered 2014-04-29 – 2014-05-03 (×8): 100 mg via ORAL
  Filled 2014-04-29 (×9): qty 1

## 2014-04-29 MED ORDER — ATORVASTATIN CALCIUM 40 MG PO TABS
40.0000 mg | ORAL_TABLET | Freq: Every morning | ORAL | Status: DC
  Filled 2014-04-29: qty 1

## 2014-04-29 MED ORDER — GLIMEPIRIDE 1 MG PO TABS
1.0000 mg | ORAL_TABLET | Freq: Every day | ORAL | Status: DC
  Filled 2014-04-29 (×2): qty 1

## 2014-04-29 MED ORDER — DEXTROSE-NACL 5-0.9 % IV SOLN
INTRAVENOUS | Status: DC
  Administered 2014-04-29 – 2014-04-30 (×2): via INTRAVENOUS

## 2014-04-29 MED ORDER — UMECLIDINIUM-VILANTEROL 62.5-25 MCG/INH IN AEPB: 1.0000 | INHALATION_SPRAY | Freq: Every morning | RESPIRATORY_TRACT | Status: DC

## 2014-04-29 MED ORDER — ADULT MULTIVITAMIN W/MINERALS CH
1.0000 | ORAL_TABLET | Freq: Every morning | ORAL | Status: DC
  Administered 2014-04-30 – 2014-05-03 (×4): 1 via ORAL
  Filled 2014-04-29 (×4): qty 1

## 2014-04-29 MED ORDER — ASPIRIN EC 81 MG PO TBEC
81.0000 mg | DELAYED_RELEASE_TABLET | Freq: Every morning | ORAL | Status: DC
  Administered 2014-04-29 – 2014-05-03 (×5): 81 mg via ORAL
  Filled 2014-04-29 (×5): qty 1

## 2014-04-29 MED ORDER — LEVOFLOXACIN IN D5W 750 MG/150ML IV SOLN
750.0000 mg | INTRAVENOUS | Status: AC
  Administered 2014-04-29: 750 mg via INTRAVENOUS
  Filled 2014-04-29: qty 150

## 2014-04-29 MED ORDER — PROMETHAZINE HCL 25 MG PO TABS: 12.5000 mg | ORAL_TABLET | Freq: Four times a day (QID) | ORAL | Status: DC | PRN

## 2014-04-29 MED ORDER — SODIUM CHLORIDE 0.9 % IV BOLUS (SEPSIS)
1000.0000 mL | Freq: Once | INTRAVENOUS | Status: AC
  Administered 2014-04-29: 1000 mL via INTRAVENOUS

## 2014-04-29 MED ORDER — SODIUM BICARBONATE 650 MG PO TABS
1300.0000 mg | ORAL_TABLET | Freq: Every morning | ORAL | Status: DC
  Filled 2014-04-29: qty 2

## 2014-04-29 MED ORDER — CLONIDINE HCL 0.1 MG PO TABS
0.1000 mg | ORAL_TABLET | Freq: Two times a day (BID) | ORAL | Status: DC
  Administered 2014-04-29 – 2014-05-03 (×8): 0.1 mg via ORAL
  Filled 2014-04-29 (×9): qty 1

## 2014-04-29 MED ORDER — FLUOXETINE HCL 10 MG PO CAPS
10.0000 mg | ORAL_CAPSULE | Freq: Every morning | ORAL | Status: DC
  Administered 2014-04-30 – 2014-05-03 (×4): 10 mg via ORAL
  Filled 2014-04-29 (×4): qty 1

## 2014-04-29 MED ORDER — CALCIUM CARBONATE-VITAMIN D 500-200 MG-UNIT PO TABS
1.0000 | ORAL_TABLET | Freq: Three times a day (TID) | ORAL | Status: DC
  Administered 2014-04-29 – 2014-05-03 (×11): 1 via ORAL
  Filled 2014-04-29 (×13): qty 1

## 2014-04-29 NOTE — ED Notes (Signed)
MD At bedside

## 2014-04-29 NOTE — Progress Notes (Signed)
ANTIBIOTIC CONSULT NOTE - INITIAL  Pharmacy Consult for Levaquin Indication: CAP  Allergies  Allergen Reactions  . Amlodipine Besylate Swelling  . Benazepril Hcl Other (See Comments)    elevated K  . Risperidone Other (See Comments)    loss of motor skills  . Tramadol Nausea And Vomiting  . Valsartan Other (See Comments)    elevated K  . Penicillins Itching and Rash    Patient Measurements: Height: 5\' 9"  (175.3 cm) Weight: 178 lb (80.74 kg) IBW/kg (Calculated) : 70.7  Vital Signs: Temp: 97.6 F (36.4 C) (06/28 0939) Temp src: Oral (06/28 0939) BP: 159/63 mmHg (06/28 1130) Pulse Rate: 70 (06/28 1145)  Labs:  Recent Labs  04/29/14 0959 04/29/14 1011  WBC 20.2*  --   HGB 8.7* 9.9*  PLT 474*  --   CREATININE  --  3.00*   Estimated Creatinine Clearance: 25.7 ml/min (by C-G formula based on Cr of 2.6).  Microbiology: No results found for this or any previous visit (from the past 720 hour(s)).  Medical History: Past Medical History  Diagnosis Date  . Diabetes mellitus   . Hypertension   . Anxiety   . COPD (chronic obstructive pulmonary disease)   . Hyperlipemia   . GERD (gastroesophageal reflux disease)   . Barrett's esophagus with high grade dysplasia     s/p RFA  . PVD (peripheral vascular disease)   . CAD (coronary artery disease)   . Tobacco user   . Depression   . Hiatal hernia   . Myocardial infarction 12/1996  . AAA (abdominal aortic aneurysm)   . Renal insufficiency 2010    Medications:  Anti-infectives   None     Assessment: 64 yoM admitted 6/28 with suspected CAP.  Noted penicillin allergy (itching, rash).  Pharmacy consulted to dose Levaquin.  Tmax: 97.6  WBCs: 20.2  Renal:  Acute insufficiency (baseline SCr appears 1.5-2); current SCr 3, CrCl ~ 26 ml/min   Goal of Therapy:  Appropriate abx dosing, eradication of infection.   Plan:   Levaquin 750mg  IV q48h  Follow up renal function and cultures as available.    Gretta Arab PharmD, BCPS Pager [phone] 04/29/2014 12:11 PM

## 2014-04-29 NOTE — Progress Notes (Signed)
ANTIBIOTIC CONSULT NOTE - INITIAL  Pharmacy Consult for Primaxin Indication: R/O Intra-abdominal infection  Allergies  Allergen Reactions  . Amlodipine Besylate Swelling  . Benazepril Hcl Other (See Comments)    elevated K  . Risperidone Other (See Comments)    loss of motor skills  . Tramadol Nausea And Vomiting  . Valsartan Other (See Comments)    elevated K  . Penicillins Itching and Rash    Patient Measurements: Height: 5\' 9"  (175.3 cm) Weight: 178 lb (80.74 kg) IBW/kg (Calculated) : 70.7 Adjusted Body Weight:   Vital Signs: Temp: 102.3 F (39.1 C) (06/28 2051) Temp src: Oral (06/28 2051) BP: 170/67 mmHg (06/28 2051) Pulse Rate: 90 (06/28 2051) Intake/Output from previous day:   Intake/Output from this shift:    Labs:  Recent Labs  04/29/14 0959 04/29/14 1011 04/29/14 1150  WBC 20.2*  --   --   HGB 8.7* 9.9*  --   PLT 474*  --   --   CREATININE  --  3.00* 2.60*   Estimated Creatinine Clearance: 25.7 ml/min (by C-G formula based on Cr of 2.6). No results found for this basename: VANCOTROUGH, VANCOPEAK, VANCORANDOM, GENTTROUGH, GENTPEAK, GENTRANDOM, TOBRATROUGH, TOBRAPEAK, TOBRARND, AMIKACINPEAK, AMIKACINTROU, AMIKACIN,  in the last 72 hours   Microbiology: No results found for this or any previous visit (from the past 720 hour(s)).  Medical History: Past Medical History  Diagnosis Date  . Diabetes mellitus   . Hypertension   . Anxiety   . COPD (chronic obstructive pulmonary disease)   . Hyperlipemia   . GERD (gastroesophageal reflux disease)   . Barrett's esophagus with high grade dysplasia     s/p RFA  . PVD (peripheral vascular disease)   . CAD (coronary artery disease)   . Tobacco user   . Depression   . Hiatal hernia   . Myocardial infarction 12/1996  . AAA (abdominal aortic aneurysm)   . Renal insufficiency 2010    Medications:  Anti-infectives   Start     Dose/Rate Route Frequency Ordered Stop   05/01/14 1000  levofloxacin  (LEVAQUIN) IVPB 750 mg  Status:  Discontinued     750 mg 100 mL/hr over 90 Minutes Intravenous Every 48 hours 04/29/14 1306 04/29/14 1542   05/01/14 1000  levofloxacin (LEVAQUIN) tablet 750 mg     750 mg Oral Every 48 hours 04/29/14 1552     04/29/14 2200  imipenem-cilastatin (PRIMAXIN) 250 mg in sodium chloride 0.9 % 100 mL IVPB     250 mg 200 mL/hr over 30 Minutes Intravenous 4 times per day 04/29/14 2154     04/29/14 1230  levofloxacin (LEVAQUIN) IVPB 750 mg     750 mg 100 mL/hr over 90 Minutes Intravenous NOW 04/29/14 1217 04/29/14 1434     Assessment: Patient with r/o  Intra-abdominal infection.  Goal of Therapy:  Primaxin dosed based on patient weight and renal function   Plan:  Follow up culture results Primaxin 250mg  iv q6hr  Tyler Deis, Shea Stakes Crowford 04/29/2014,9:54 PM

## 2014-04-29 NOTE — ED Notes (Signed)
Pt returned from CT °

## 2014-04-29 NOTE — ED Notes (Signed)
Attempted to call report. RN will call back. 

## 2014-04-29 NOTE — ED Notes (Signed)
MD J. Knapp at bedside. 

## 2014-04-29 NOTE — H&P (Signed)
[facility] History and Physical  DONYE CAMPANELLI PNT:[id number] DOB: [birth year] DOA: 04/29/2014  Referring physician: Dr. Tomi Bamberger PCP: Walker Kehr, MD   Chief Complaint: cough and poor oral intake  HPI: [name] is a 73 y.o. male  With past medical history significant for iron deficiency anemia, chronic renal failure stage III, diabetes mellitus, depression, protein calorie malnutrition. Patient presents to the hospital after the above complaints. States to poor oral intake has been going on for several weeks. Cough has recently presented and getting worse. Given persistence of symptoms and increasing weakness patient decided to present to the emergency department for further evaluation recommendations. Nothing he is aware of makes it better or worse.  In the ED patient was found to have elevated white blood cell count of 20,000 and serum creatinine of 2.6.   Review of Systems:  Constitutional:  + weight loss, night sweats, Fevers, chills, fatigue.  HEENT:  No headaches, Difficulty swallowing,Tooth/dental problems,Sore throat,  No sneezing, itching, ear ache, nasal congestion, post nasal drip,  Cardio-vascular:  No chest pain, Orthopnea, PND, swelling in lower extremities, anasarca, dizziness, palpitations  GI:  No heartburn, indigestion, abdominal pain, nausea, vomiting, diarrhea,+ change in bowel habits,+ loss of appetite  Resp:  No shortness of breath with exertion or at rest. No excess mucus, no productive cough,+ non-productive cough, No coughing up of blood.No change in color of mucus.No wheezing.No chest wall deformity  Skin:  no rash or lesions.  GU:  no dysuria, change in color of urine, no urgency or frequency. No flank pain.  Musculoskeletal:  No joint pain or swelling. No decreased range of motion. No back pain.  Psych:  No change in mood or affect. No depression or anxiety. No memory loss.   Past Medical History  Diagnosis Date  . Diabetes mellitus     . Hypertension   . Anxiety   . COPD (chronic obstructive pulmonary disease)   . Hyperlipemia   . GERD (gastroesophageal reflux disease)   . Barrett's esophagus with high grade dysplasia     s/p RFA  . PVD (peripheral vascular disease)   . CAD (coronary artery disease)   . Tobacco user   . Depression   . Hiatal hernia   . Myocardial infarction 12/1996  . AAA (abdominal aortic aneurysm)   . Renal insufficiency 2010   Past Surgical History  Procedure Laterality Date  . Tonsillectomy    . Nissen fundoplication    . Abdominal aortic aneurysm repair  2010    and right common iliac artery aneurysm, DR HAYES  . Upper gastrointestinal endoscopy  07/07/2011    barretts esophagus, hiatal hernia, gastritis, ablations in esophagus  . Colonoscopy  05/07/2005    diverticulosis, internal and external hemorrhoids  . Abdominal aortic aneurysm repair    . Radiofrequency ablation  multiple    Barrett's, high-grade dysplasia  . Colonoscopy N/A 03/01/2013    Procedure: COLONOSCOPY;  Surgeon: Gatha Mayer, MD;  Location: [facility];  Service: Endoscopy;  Laterality: N/A;   Social History:  reports that he has been smoking Cigarettes.  He has a 54 pack-year smoking history. He has never used smokeless tobacco. He reports that he does not drink alcohol or use illicit drugs.  Allergies  Allergen Reactions  . Amlodipine Besylate Swelling  . Benazepril Hcl Other (See Comments)    elevated K  . Risperidone Other (See Comments)    loss of motor skills  . Tramadol Nausea And Vomiting  . Valsartan Other (  See Comments)    elevated K  . Penicillins Itching and Rash    Family History  Problem Relation Age of Onset  . Coronary artery disease Other   . Diabetes Other   . Asthma Other   . Mental illness Mother     dementia  . Cancer Mother   . Diabetes Mother   . Heart disease Father   . Diabetes Father   . Stomach cancer Father   . Cancer Father   . Colon cancer Neg Hx   . Esophageal cancer  Neg Hx      Prior to Admission medications   Medication Sig Start Date End Date Taking? Authorizing Provider  ALPRAZolam Duanne Moron) 0.5 MG tablet Take 0.5 mg by mouth 2 (two) times daily as needed for anxiety.   Yes Historical Provider, MD  aspirin EC 81 MG tablet Take 81 mg by mouth every morning.    Yes Historical Provider, MD  atorvastatin (LIPITOR) 40 MG tablet Take 40 mg by mouth every morning.   Yes Historical Provider, MD  calcium-vitamin D (OSCAL WITH D) 500-200 MG-UNIT per tablet Take 1 tablet by mouth 3 (three) times daily.   Yes Historical Provider, MD  cholecalciferol (VITAMIN D) 1000 UNITS tablet Take 4,000 Units by mouth every morning.   Yes Historical Provider, MD  cloNIDine (CATAPRES) 0.1 MG tablet Take 0.1 mg by mouth 2 (two) times daily.   Yes Historical Provider, MD  feeding supplement (RESOURCE BREEZE) LIQD Take 1 Container by mouth 4 (four) times daily -  with meals and at bedtime. 03/29/13  Yes Robbie Lis, MD  FLUoxetine (PROZAC) 10 MG capsule Take 10 mg by mouth every morning.   Yes Historical Provider, MD  gabapentin (NEURONTIN) 100 MG capsule Take 100 mg by mouth 2 (two) times daily.   Yes Historical Provider, MD  glimepiride (AMARYL) 1 MG tablet Take 1 mg by mouth daily with breakfast.   Yes Historical Provider, MD  HYDROcodone-homatropine (HYCODAN) 5-1.5 MG/5ML syrup Take 5 mLs by mouth every 6 (six) hours as needed for cough. 04/26/14  Yes Aleksei Plotnikov V, MD  labetalol (NORMODYNE) 200 MG tablet Take 200 mg by mouth 3 (three) times daily.   Yes Historical Provider, MD  megestrol (MEGACE) 40 MG tablet Take 40 mg by mouth 2 (two) times daily.   Yes Historical Provider, MD  mirtazapine (REMERON) 15 MG tablet Take 15 mg by mouth at bedtime.   Yes Historical Provider, MD  Multiple Vitamin (MULTIVITAMIN WITH MINERALS) TABS tablet Take 1 tablet by mouth every morning.   Yes Historical Provider, MD  nortriptyline (PAMELOR) 10 MG capsule Take 20 mg by mouth at bedtime.   Yes  Historical Provider, MD  omeprazole (PRILOSEC) 40 MG capsule Take 40 mg by mouth 2 (two) times daily.   Yes Historical Provider, MD  promethazine (PHENERGAN) 12.5 MG tablet Take 12.5 mg by mouth every 6 (six) hours as needed for nausea or vomiting.   Yes Historical Provider, MD  sodium bicarbonate 650 MG tablet Take 1,300 mg by mouth every morning.    Yes Historical Provider, MD  temazepam (RESTORIL) 15 MG capsule Take 15-30 mg by mouth at bedtime as needed for sleep.   Yes Historical Provider, MD  Umeclidinium-Vilanterol (ANORO ELLIPTA) 62.5-25 MCG/INH AEPB Inhale 1 puff into the lungs every morning.   Yes Historical Provider, MD   Physical Exam: Filed Vitals:   04/29/14 1352  BP: 176/71  Pulse: 75  Temp: 98.4 F (36.9 C)  Resp:  22    BP 176/71  Pulse 75  Temp(Src) 98.4 F (36.9 C) (Oral)  Resp 22  Ht 5\' 9"  (1.753 m)  Wt 80.74 kg (178 lb)  BMI 26.27 kg/m2  SpO2 98%  General:  Appears calm and comfortable Eyes: PERRL, normal lids, irises & conjunctiva ENT: grossly normal hearing, lips & tongue, poor oral hygeine  Neck: no LAD, masses or thyromegaly Cardiovascular: RRR, no m/r/g. No LE edema. Telemetry: SR, no arrhythmias  Respiratory: Rhales BL, no wheezes Abdomen: soft, nt, nd Skin: no rash or induration seen on limited exam Musculoskeletal: grossly normal tone BUE/BLE Psychiatric: normal mood and affect Neurologic: moves extremities equally, no facial asymmetry          Labs on Admission:  Basic Metabolic Panel:  Recent Labs Lab 04/29/14 1011 04/29/14 1150  NA 134* 136*  K 6.0* 3.8  CL 105 100  CO2  --  17*  GLUCOSE 144* 106*  BUN 94* 68*  CREATININE 3.00* 2.60*  CALCIUM  --  9.5   Liver Function Tests:  Recent Labs Lab 04/29/14 1150  AST 102*  ALT 131*  ALKPHOS 594*  BILITOT 0.2*  PROT 7.2  ALBUMIN 2.1*   No results found for this basename: LIPASE, AMYLASE,  in the last 168 hours No results found for this basename: AMMONIA,  in the last 168  hours CBC:  Recent Labs Lab 04/29/14 0959 04/29/14 1011  WBC 20.2*  --   HGB 8.7* 9.9*  HCT 26.7* 29.0*  MCV 91.1  --   PLT 474*  --    Cardiac Enzymes: No results found for this basename: CKTOTAL, CKMB, CKMBINDEX, TROPONINI,  in the last 168 hours  BNP (last 3 results) No results found for this basename: PROBNP,  in the last 8760 hours CBG:  Recent Labs Lab 04/29/14 0951  GLUCAP 145*    Radiological Exams on Admission: Dg Chest 2 View  04/29/2014   CLINICAL DATA:  Weakness, anorexia  EXAM: CHEST  2 VIEW  COMPARISON:  02/14/2014  FINDINGS: There is nodular mass like consolidation in right lower lobe measures 8.2 cm x 5.4 cm. Further correlation with CT scan of the chest is recommended to exclude a mass. Osteopenia and degenerative changes thoracic spine. Aortic graft.  IMPRESSION: Nodular mass like consolidation in right lower lobe measures 8.2 cm. Further correlation with CT scan of the chest is recommended.   Electronically Signed   By: Lahoma Crocker M.D.   On: 04/29/2014 11:09   Ct Chest Wo Contrast  04/29/2014   CLINICAL DATA:  Weakness.  Anorexia.  EXAM: CT CHEST WITHOUT CONTRAST  TECHNIQUE: Multidetector CT imaging of the chest was performed following the standard protocol without IV contrast.  COMPARISON:  Chest radiograph 02/14/2014; 04/29/2014.  FINDINGS: Visualized neck base is unremarkable. Probable asymmetric gynecomastia within the left chest wall. 1.4 cm precarinal lymph node. 1.0 cm subcarinal lymph node. Normal heart size. Coronary arterial calcifications. No pericardial effusion. Calcification of the thoracic aorta.  Central airways are patent. Within the right upper lobe there is a spiculated 1.6 x 1.4 cm mass (image 19; series 5) in the area of previously biopsied and predominately resolved infectious process. Along the right fissure between the right upper and right middle lobes there is a large masslike consolidative opacity measuring 10 x 6.8 x 5.7 cm. There is  surrounding ground-glass opacity within the right upper and right middle lobes. Additionally there is dependent ground-glass opacity within the bilateral lower lobes. No pleural effusion  or pneumothorax.  Incidental imaging of the upper abdomen demonstrates a small hiatal hernia. Atrophy of the left kidney, incompletely evaluated.  No aggressive or acute appearing osseous lesions.  IMPRESSION: 1. Large masslike consolidative opacity within the right upper and right middle lobes measuring up to 10 cm is favored to be infectious in etiology given the interval change between plain film radiographs 02/14/2014 and 04/29/2014. Continued radiographic followup is recommended to ensure resolution. 2. Spiculated masslike opacity within the right upper lobe is likely secondary to scarring from resolved prior infectious right upper lobe process. Recommend further evaluation with dedicated chest CT in 3 months to evaluate for continued interval resolution and/or stability to exclude malignancy. 3. Mildly prominent mediastinal lymph nodes favored to be reactive in etiology. 4. Probable asymmetric gynecomastia within the left chest wall. Recommend clinical and mammographic correlation as clinically indicated if concern for mass.   Electronically Signed   By: Lovey Newcomer M.D.   On: 04/29/2014 13:22    EKG: Independently reviewed. Sinus Rhythm with no ST elevation or depression  Assessment/Plan Right lung mass: - At this point per imaging results favored to be from infectious etiology. - Place on oral antibiotic Levaquin per pharmacy to dose - Repeat imaging specifically with CT scan in 3 months is recommended by radiologist  Active Problems:   DEPRESSION - Stable continue home regimen    CORONARY ARTERY DISEASE - Stable continue home regimen    DM (diabetes mellitus), type 2 with peripheral vascular complications - Diabetic diet -Sliding scale insulin, with home oral hypoglycemic    Renal failure (ARF), acute  on chronic - Most likely due to poor oral intake. Suspect prerenal causes. Will obtain renal ultrasound to rule out obstructive causes.    Protein-calorie malnutrition, severe - Consulted RD    Anemia, iron deficiency - Fecal occult negative    PNA (pneumonia) - Please see principal problem  DVT prophylaxis -Heparin subcutaneous  Code Status: DNR Family Communication: discussed with patient and family member at bedside. Disposition Plan:  Pending improvement in condition  Time spent: > 55 minutes  Velvet Bathe [facility] Pager [phone]  **Disclaimer: This note may have been dictated with voice recognition software. Similar sounding words can inadvertently be transcribed and this note may contain transcription errors which may not have been corrected upon publication of note.**

## 2014-04-29 NOTE — ED Notes (Signed)
Provided Pt with Urinal and asked him to notify when he was able to void

## 2014-04-29 NOTE — ED Provider Notes (Signed)
CSN: [id number]     Arrival date & time 04/29/14  4627 History   First MD Initiated Contact with Patient 04/29/14 1004     Chief Complaint  Patient presents with  . Weakness  . Anorexia   HPI Pt has not been eating well for at least the last week.  Pt states he just does not want to eat.  He has some nausea but has not had any vomiting.  No abdominal pain.  Pt has been drinking water.  Pt called his PCP.  They were trying to prescribe him an appetite stimulant but there were issues with his insurance.  No syncope.  He does feel like he is getting weaker.  Pt's mother passed away recently but his symptoms started before that.  He has had this problem before and was in the hospital last year.  His electrolytes were abnormal at that time. Past Medical History  Diagnosis Date  . Diabetes mellitus   . Hypertension   . Anxiety   . COPD (chronic obstructive pulmonary disease)   . Hyperlipemia   . GERD (gastroesophageal reflux disease)   . Barrett's esophagus with high grade dysplasia     s/p RFA  . PVD (peripheral vascular disease)   . CAD (coronary artery disease)   . Tobacco user   . Depression   . Hiatal hernia   . Myocardial infarction 12/1996  . AAA (abdominal aortic aneurysm)   . Renal insufficiency 2010   Past Surgical History  Procedure Laterality Date  . Tonsillectomy    . Nissen fundoplication    . Abdominal aortic aneurysm repair  2010    and right common iliac artery aneurysm, DR HAYES  . Upper gastrointestinal endoscopy  07/07/2011    barretts esophagus, hiatal hernia, gastritis, ablations in esophagus  . Colonoscopy  05/07/2005    diverticulosis, internal and external hemorrhoids  . Abdominal aortic aneurysm repair    . Radiofrequency ablation  multiple    Barrett's, high-grade dysplasia  . Colonoscopy N/A 03/01/2013    Procedure: COLONOSCOPY;  Surgeon: Gatha Mayer, MD;  Location: [facility];  Service: Endoscopy;  Laterality: N/A;   Family History  Problem Relation  Age of Onset  . Coronary artery disease Other   . Diabetes Other   . Asthma Other   . Mental illness Mother     dementia  . Cancer Mother   . Diabetes Mother   . Heart disease Father   . Diabetes Father   . Stomach cancer Father   . Cancer Father   . Colon cancer Neg Hx   . Esophageal cancer Neg Hx    History  Substance Use Topics  . Smoking status: Current Every Day Smoker -- 1.00 packs/day for 54 years    Types: Cigarettes  . Smokeless tobacco: Never Used     Comment: Pt smokes 1/2-1 PPD  . 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  HENT: Negative for rhinorrhea.   Respiratory: Positive for cough.   Gastrointestinal: Negative for vomiting, abdominal pain and diarrhea.  Genitourinary: Negative for frequency.  Psychiatric/Behavioral: Positive for dysphoric mood.  All other systems reviewed and are negative.     Allergies  Amlodipine besylate; Benazepril hcl; Risperidone; Tramadol; Valsartan; and Penicillins  Home Medications   Prior to Admission medications   Medication Sig Start Date End Date Taking? Authorizing Provider  ALPRAZolam Duanne Moron) 0.5 MG tablet Take 0.5  mg by mouth 2 (two) times daily as needed for anxiety.   Yes Historical Provider, MD  aspirin EC 81 MG tablet Take 81 mg by mouth every morning.    Yes Historical Provider, MD  atorvastatin (LIPITOR) 40 MG tablet Take 40 mg by mouth every morning.   Yes Historical Provider, MD  calcium-vitamin D (OSCAL WITH D) 500-200 MG-UNIT per tablet Take 1 tablet by mouth 3 (three) times daily.   Yes Historical Provider, MD  cholecalciferol (VITAMIN D) 1000 UNITS tablet Take 4,000 Units by mouth every morning.   Yes Historical Provider, MD  cloNIDine (CATAPRES) 0.1 MG tablet Take 0.1 mg by mouth 2 (two) times daily.   Yes Historical Provider, MD  feeding supplement (RESOURCE BREEZE) LIQD Take 1 Container by mouth 4 (four) times daily -  with meals and at  bedtime. 03/29/13  Yes Robbie Lis, MD  FLUoxetine (PROZAC) 10 MG capsule Take 10 mg by mouth every morning.   Yes Historical Provider, MD  gabapentin (NEURONTIN) 100 MG capsule Take 100 mg by mouth 2 (two) times daily.   Yes Historical Provider, MD  glimepiride (AMARYL) 1 MG tablet Take 1 mg by mouth daily with breakfast.   Yes Historical Provider, MD  HYDROcodone-homatropine (HYCODAN) 5-1.5 MG/5ML syrup Take 5 mLs by mouth every 6 (six) hours as needed for cough. 04/26/14  Yes Aleksei Plotnikov V, MD  labetalol (NORMODYNE) 200 MG tablet Take 200 mg by mouth 3 (three) times daily.   Yes Historical Provider, MD  megestrol (MEGACE) 40 MG tablet Take 40 mg by mouth 2 (two) times daily.   Yes Historical Provider, MD  mirtazapine (REMERON) 15 MG tablet Take 15 mg by mouth at bedtime.   Yes Historical Provider, MD  Multiple Vitamin (MULTIVITAMIN WITH MINERALS) TABS tablet Take 1 tablet by mouth every morning.   Yes Historical Provider, MD  nortriptyline (PAMELOR) 10 MG capsule Take 20 mg by mouth at bedtime.   Yes Historical Provider, MD  omeprazole (PRILOSEC) 40 MG capsule Take 40 mg by mouth 2 (two) times daily.   Yes Historical Provider, MD  promethazine (PHENERGAN) 12.5 MG tablet Take 12.5 mg by mouth every 6 (six) hours as needed for nausea or vomiting.   Yes Historical Provider, MD  sodium bicarbonate 650 MG tablet Take 1,300 mg by mouth every morning.    Yes Historical Provider, MD  temazepam (RESTORIL) 15 MG capsule Take 15-30 mg by mouth at bedtime as needed for sleep.   Yes Historical Provider, MD  Umeclidinium-Vilanterol (ANORO ELLIPTA) 62.5-25 MCG/INH AEPB Inhale 1 puff into the lungs every morning.   Yes Historical Provider, MD   BP 159/63  Pulse 70  Temp(Src) 97.6 F (36.4 C) (Oral)  Resp 25  SpO2 93% Physical Exam  Nursing note and vitals reviewed. Constitutional: He appears well-developed and well-nourished. No distress.  HENT:  Head: Normocephalic and atraumatic.  Right Ear:  External ear normal.  Left Ear: External ear normal.  Eyes: Conjunctivae are normal. Right eye exhibits no discharge. Left eye exhibits no discharge. No scleral icterus.  Neck: Neck supple. No tracheal deviation present.  Cardiovascular: Normal rate, regular rhythm and intact distal pulses.   Pulmonary/Chest: Effort normal and breath sounds normal. No stridor. No respiratory distress. He has no wheezes. He has no rales.  Abdominal: Soft. Bowel sounds are normal. He exhibits no distension. There is no tenderness. There is no rebound and no guarding.  Musculoskeletal: He exhibits no edema and no tenderness.  Neurological: He is  alert. He has normal strength. No cranial nerve deficit (no facial droop, extraocular movements intact, no slurred speech) or sensory deficit. He exhibits normal muscle tone. He displays no seizure activity. Coordination normal.  General weakness, requiring assistance with chair to bed transfers  Skin: Skin is warm and dry. No rash noted.  Psychiatric: He exhibits a depressed mood.    ED Course  Procedures (including critical care time) Labs Review Labs Reviewed  CBC - Abnormal; Notable for the following:    WBC 20.2 (*)    RBC 2.93 (*)    Hemoglobin 8.7 (*)    HCT 26.7 (*)    Platelets 474 (*)    All other components within normal limits  CBG MONITORING, ED - Abnormal; Notable for the following:    Glucose-Capillary 145 (*)    All other components within normal limits  I-STAT CHEM 8, ED - Abnormal; Notable for the following:    Sodium 134 (*)    Potassium 6.0 (*)    BUN 94 (*)    Creatinine, Ser 3.00 (*)    Glucose, Bld 144 (*)    Hemoglobin 9.9 (*)    HCT 29.0 (*)    All other components within normal limits  URINALYSIS, ROUTINE W REFLEX MICROSCOPIC  COMPREHENSIVE METABOLIC PANEL  POC OCCULT BLOOD, ED    Imaging Review Dg Chest 2 View  04/29/2014   CLINICAL DATA:  Weakness, anorexia  EXAM: CHEST  2 VIEW  COMPARISON:  02/14/2014  FINDINGS: There is  nodular mass like consolidation in right lower lobe measures 8.2 cm x 5.4 cm. Further correlation with CT scan of the chest is recommended to exclude a mass. Osteopenia and degenerative changes thoracic spine. Aortic graft.  IMPRESSION: Nodular mass like consolidation in right lower lobe measures 8.2 cm. Further correlation with CT scan of the chest is recommended.   Electronically Signed   By: Lahoma Crocker M.D.   On: 04/29/2014 11:09     EKG Interpretation   Date/Time:  Sunday April 29 2014 10:04:10 EDT Ventricular Rate:  72 PR Interval:  184 QRS Duration: 108 QT Interval:  397 QTC Calculation: 434 R Axis:   39 Text Interpretation:  Sinus rhythm Consider left atrial enlargement  Probable left ventricular hypertrophy No significant change since last  tracing Confirmed by KNAPP  MD-J, JON ([id number]) on 04/29/2014 10:05:53 AM      MDM   Final diagnoses:  Acute renal insufficiency  Dehydration  Anorexia  Lung mass     Patient has decreased appetite with progressive weakness. It may be a component of depression causing some of his anorexia. Patient does not complain of any pain he just does not want to eat. He has had a recent death in the family.  Patient does have elevated white blood cell count. Check urinalysis and chest x-ray to assess for possible bacterial infection.  With his acute renal insufficiency, hyperkalemia and will require admission for IV hydration and further treatment  1203  Abnormal CXR.  WIll start on abx for possible cap.  Non con chest ct for further evaluation.  Pt does smoke and is at risk for lung ca    Dorie Rank, MD 04/29/14 1204

## 2014-04-29 NOTE — ED Notes (Signed)
Pt attempting to provide a urine sample.

## 2014-04-29 NOTE — ED Notes (Signed)
Pt from home c/o nausea x a couple of weeks and loss of appetite. He denies pain. Last meal was about three days ago (Oatmeal). Pt is dealing with the death of his mother (this past Wednesday). Hx of diabetes. Pt is pale in appearance and reports weakness.

## 2014-04-30 ENCOUNTER — Encounter (HOSPITAL_COMMUNITY): Payer: Self-pay | Admitting: Radiology

## 2014-04-30 ENCOUNTER — Inpatient Hospital Stay (HOSPITAL_COMMUNITY): Payer: Medicare Other

## 2014-04-30 DIAGNOSIS — R222 Localized swelling, mass and lump, trunk: Secondary | ICD-10-CM

## 2014-04-30 DIAGNOSIS — J189 Pneumonia, unspecified organism: Secondary | ICD-10-CM

## 2014-04-30 DIAGNOSIS — J449 Chronic obstructive pulmonary disease, unspecified: Secondary | ICD-10-CM

## 2014-04-30 DIAGNOSIS — R7989 Other specified abnormal findings of blood chemistry: Secondary | ICD-10-CM

## 2014-04-30 DIAGNOSIS — D509 Iron deficiency anemia, unspecified: Secondary | ICD-10-CM

## 2014-04-30 LAB — BASIC METABOLIC PANEL
BUN: 59 mg/dL — ABNORMAL HIGH (ref 6–23)
CO2: 17 meq/L — AB (ref 19–32)
CREATININE: 2.34 mg/dL — AB (ref 0.50–1.35)
Calcium: 9.4 mg/dL (ref 8.4–10.5)
Chloride: 106 mEq/L (ref 96–112)
GFR calc Af Amer: 30 mL/min — ABNORMAL LOW (ref >90)
GFR calc non Af Amer: 26 mL/min — ABNORMAL LOW (ref >90)
GLUCOSE: 145 mg/dL — AB (ref 70–99)
Potassium: 3.5 mEq/L — ABNORMAL LOW (ref 3.7–5.3)
Sodium: 141 mEq/L (ref 137–147)

## 2014-04-30 LAB — GLUCOSE, CAPILLARY
GLUCOSE-CAPILLARY: 83 mg/dL (ref 70–99)
GLUCOSE-CAPILLARY: 78 mg/dL (ref 70–99)
Glucose-Capillary: 151 mg/dL — ABNORMAL HIGH (ref 70–99)
Glucose-Capillary: 104 mg/dL — ABNORMAL HIGH (ref 70–99)

## 2014-04-30 LAB — CBC
HEMATOCRIT: 25.7 % — AB (ref 39.0–52.0)
HEMOGLOBIN: 8.3 g/dL — AB (ref 13.0–17.0)
MCH: 29.7 pg (ref 26.0–34.0)
MCHC: 32.3 g/dL (ref 30.0–36.0)
MCV: 92.1 fL (ref 78.0–100.0)
PLATELETS: 416 10*3/uL — AB (ref 150–400)
RBC: 2.79 MIL/uL — AB (ref 4.22–5.81)
RDW: 14.2 % (ref 11.5–15.5)
WBC: 19.1 10*3/uL — ABNORMAL HIGH (ref 4.0–10.5)

## 2014-04-30 LAB — HEPATITIS PANEL, ACUTE
HCV Ab: NEGATIVE
HEP A IGM: NONREACTIVE
HEP B S AG: NEGATIVE
Hep B C IgM: NONREACTIVE

## 2014-04-30 MED ORDER — SODIUM BICARBONATE 8.4 % IV SOLN
INTRAVENOUS | Status: DC
  Administered 2014-04-30 – 2014-05-01 (×2): via INTRAVENOUS
  Filled 2014-04-30 (×5): qty 150

## 2014-04-30 MED ORDER — SODIUM BICARBONATE 650 MG PO TABS
650.0000 mg | ORAL_TABLET | Freq: Three times a day (TID) | ORAL | Status: DC
  Administered 2014-04-30 – 2014-05-03 (×10): 650 mg via ORAL
  Filled 2014-04-30 (×12): qty 1

## 2014-04-30 MED ORDER — BOOST / RESOURCE BREEZE PO LIQD
1.0000 | Freq: Two times a day (BID) | ORAL | Status: DC
  Administered 2014-04-30 – 2014-05-03 (×6): 1 via ORAL

## 2014-04-30 MED ORDER — SODIUM CHLORIDE 0.9 % IV SOLN: INTRAVENOUS | Status: DC

## 2014-04-30 MED ORDER — POTASSIUM CHLORIDE CRYS ER 20 MEQ PO TBCR
20.0000 meq | EXTENDED_RELEASE_TABLET | Freq: Once | ORAL | Status: AC
  Administered 2014-04-30: 20 meq via ORAL
  Filled 2014-04-30: qty 1

## 2014-04-30 MED ORDER — IOHEXOL 300 MG/ML  SOLN
25.0000 mL | INTRAMUSCULAR | Status: AC
  Administered 2014-04-30 (×2): 25 mL via ORAL

## 2014-04-30 NOTE — Progress Notes (Signed)
INITIAL NUTRITION ASSESSMENT  DOCUMENTATION CODES Per approved criteria  -Severe  malnutrition in the context of chronic illness  Pt meets criteria for severe MALNUTRITION in the context of chronic illness as evidenced by PO intake <75% for approximately one month, moderate subcutaneous fat loss, and severe muscle wasting.   INTERVENTION: -Recommend Resource Breeze po BID, each supplement provides 250 kcal and 9 grams of protein -Recommend MagicCup supplement BID -Consider liberalizing diet d/t hx of poor PO intake -Will continue to monitor  NUTRITION DIAGNOSIS: Inadequate oral intake related to decreased appetite/depression as evidenced by PO intake <75% for 3 weeks  Goal: Pt to meet >/= 90% of their estimated nutrition needs    Monitor:  Total protein/energy intake, labs, weights, renal profile  Reason for Assessment: MST/Consult to Assess  73 y.o. male  Admitting Dx: <principal problem not specified>  ASSESSMENT: [name] is a 73 y.o. male With past medical history significant for iron deficiency anemia, chronic renal failure stage III, diabetes mellitus, depression, protein calorie malnutrition. Patient presents to the hospital after the above complaints. States to poor oral intake has been going on for several weeks. Cough has recently presented and getting worse  -Pt reported decreased appetite for 3 weeks pta -Denied any abd pain, nausea, dysphagia that inhibited PO intake. Noted general disinterest in foods, possibly related to depression as pt appeared very apathetic to nutrition, foods, appetite. -Diet recall indicated pt consuming <1 meals/day. Wife prepares meals, but pt has been refusing. Consuming minimal fluids -Pt unsure if weight loss has occurred, stated normal weight around 170 lbs -Pt has had Resource Breeze during previous admits, dislikes Ensure/Boost supplements. Will modify to BID d/t diet's fluid restriction -Pt enjoys sweets, was willing to trial  MagicCup BID.  -Current PO intake 0%, breakfast was held this AM for CT of abd -Consider liberalizing diet to regular once improvement in renal profile Nutrition Focused Physical Exam:  Subcutaneous Fat:  Orbital Region: WDL Upper Arm Region: moderate wasting Thoracic and Lumbar Region: n/a  Muscle:  Temple Region: severe Clavicle Bone Region: severe Clavicle and Acromion Bone Region: moderate Scapular Bone Region: n/a Dorsal Hand: moderate  Patellar Region: severe Anterior Thigh Region: moderate Posterior Calf Region: severe  Edema: none noted    Height: Ht Readings from Last 1 Encounters:  04/29/14 5\' 9"  (1.753 m)    Weight: Wt Readings from Last 1 Encounters:  04/29/14 178 lb (80.74 kg)    Ideal Body Weight: 160 lbs  % Ideal Body Weight: 111%  Wt Readings from Last 10 Encounters:  04/29/14 178 lb (80.74 kg)  04/04/14 168 lb (76.204 kg)  02/14/14 174 lb 12.8 oz (79.289 kg)  02/06/14 174 lb (78.926 kg)  11/16/13 169 lb (76.658 kg)  09/06/13 170 lb 6.4 oz (77.293 kg)  08/14/13 166 lb (75.297 kg)  06/08/13 162 lb (73.483 kg)  06/05/13 161 lb (73.029 kg)  06/02/13 163 lb (73.936 kg)    Usual Body Weight: 170 lbs  % Usual Body Weight: 105%  BMI:  Body mass index is 26.27 kg/(m^2).  Estimated Nutritional Needs: Kcal: 2000-2200 Protein: 65-80 gram Fluid: 1200 ml/daily per MD  Skin: WDL  Diet Order: Renal  EDUCATION NEEDS: -Education needs addressed   Intake/Output Summary (Last 24 hours) at 04/30/14 1212 Last data filed at 04/30/14 1156  Gross per 24 hour  Intake   1205 ml  Output    850 ml  Net    355 ml    Last BM: pta  Labs:   Recent Labs Lab 04/29/14 1011 04/29/14 1150 04/29/14 1200 04/30/14 0500  NA 134* 136*  --  141  K 6.0* 3.8  --  3.5*  CL 105 100  --  106  CO2  --  17*  --  17*  BUN 94* 68*  --  59*  CREATININE 3.00* 2.60*  --  2.34*  CALCIUM  --  9.5  --  9.4  MG  --   --  2.0  --   PHOS  --   --  3.6  --    GLUCOSE 144* 106*  --  145*    CBG (last 3)   Recent Labs  04/29/14 2043 04/30/14 0730 04/30/14 1125  GLUCAP 106* 83 151*    Scheduled Meds: . aspirin EC  81 mg Oral q morning - 10a  . calcium-vitamin D  1 tablet Oral TID  . cholecalciferol  4,000 Units Oral q morning - 10a  . cloNIDine  0.1 mg Oral BID  . feeding supplement (RESOURCE BREEZE)  1 Container Oral TID WC & HS  . FLUoxetine  10 mg Oral q morning - 10a  . gabapentin  100 mg Oral BID  . heparin  5,000 Units Subcutaneous 3 times per day  . imipenem-cilastatin  250 mg Intravenous 4 times per day  . insulin aspart  0-9 Units Subcutaneous TID WC  . labetalol  200 mg Oral TID  . [START ON 05/01/2014] levofloxacin  750 mg Oral Q48H  . megestrol  40 mg Oral BID  . mirtazapine  15 mg Oral QHS  . multivitamin with minerals  1 tablet Oral q morning - 10a  . nortriptyline  20 mg Oral QHS  . pantoprazole  40 mg Oral Daily  . potassium chloride  20 mEq Oral Once  . sodium bicarbonate  650 mg Oral TID  . Umeclidinium-Vilanterol  1 puff Inhalation q morning - 10a    Continuous Infusions: .  sodium bicarbonate  infusion 1000 mL      Past Medical History  Diagnosis Date  . Diabetes mellitus   . Hypertension   . Anxiety   . COPD (chronic obstructive pulmonary disease)   . Hyperlipemia   . GERD (gastroesophageal reflux disease)   . Barrett's esophagus with high grade dysplasia     s/p RFA  . PVD (peripheral vascular disease)   . CAD (coronary artery disease)   . Tobacco user   . Depression   . Hiatal hernia   . Myocardial infarction 12/1996  . AAA (abdominal aortic aneurysm)   . Renal insufficiency 2010    Past Surgical History  Procedure Laterality Date  . Tonsillectomy    . Nissen fundoplication    . Abdominal aortic aneurysm repair  2010    and right common iliac artery aneurysm, DR HAYES  . Upper gastrointestinal endoscopy  07/07/2011    barretts esophagus, hiatal hernia, gastritis, ablations in esophagus   . Colonoscopy  05/07/2005    diverticulosis, internal and external hemorrhoids  . Abdominal aortic aneurysm repair    . Radiofrequency ablation  multiple    Barrett's, high-grade dysplasia  . Colonoscopy N/A 03/01/2013    Procedure: COLONOSCOPY;  Surgeon: Gatha Mayer, MD;  Location: [facility];  Service: Endoscopy;  Laterality: N/A;    Atlee Abide MS RD LDN Clinical Dietitian [phone]

## 2014-04-30 NOTE — Consult Note (Signed)
Name: [name] MRN: [id number] DOB: Nov 07, 1940    ADMISSION DATE:  04/29/2014 CONSULTATION DATE:  04/30/14  REFERRING MD :  Dr. Tyrell Antonio  PRIMARY SERVICE:  TRH   CHIEF COMPLAINT:  Concern for Lung Mass  BRIEF PATIENT DESCRIPTION: 73 y/o M with PMH of heavy tobacco abuse and prior PNA in 03/2013 which appeared as a mass like lesion that cleared with antibiotics (followed by Dr. Gwenette Greet) who presented to [facility] on 6/28 with cough, decreased appetite, weight loss and nausea.  Work up noted concern for new mass like lesions on right, PCCM consulted for evaluation.    SIGNIFICANT EVENTS: 6/28 - admit with cough, decreased appetite, weight loss and nausea.  New mass like lesion on CXR / CT  STUDIES:  02/14/14 - CXR >> CLEARING of previous mass like consolidation  .......................................................................... 6/28 - CT Chest >>large mass like consolidation RUL, RML, spiculated mass RUL - thought to be scarring, mildly prominent mediastinal LAN    CULTURES: Sputum 6/29 >>  ANTIBIOTICS: Primaxin 6/28 >> Levaquin 6/28 >>  HISTORY OF PRESENT ILLNESS:  73 y/o M with PMH of heavy tobacco abuse, esophageal dysplasia s/p ablation at [facility], DM, COPD, HTN, Anxiety, PVD / CAD, Depression, AAA  and prior PNA in 03/2013 which appeared as a mass like lesion that cleared with antibiotics who presented to [facility] on 6/28 with approx 2 weeks of cough, decreased appetite, weight loss and nausea.  Work up noted concern for new mass like lesions/consolidation on right.  Previously in 2014, the patient had a similar episode and underwent biopsy that was negative.  Follow up CXR with Dr. Gwenette Greet in 01/2014 had cleared.    Patient was recently treated at the first of June of bronchitis with 7 days of levaquin.  He completed therapy and felt better then had a second sickening with increased cough, green sputum production.  CT assessed and noted a large mass like consolidation RUL, RML,  spiculated mass RUL - thought to be scarring, mildly prominent mediastinal LAN.  Patient reports fevers overnight.  He has had decreased appetite and weight loss - unsure of amount but has gone down a pant size.  Recently, his mother passed away and he feels this has contributed to the weight loss.  He denies HA, N/VD, chest pain, pain with inspiration, night sweats.     PAST MEDICAL HISTORY :  Past Medical History  Diagnosis Date  . Diabetes mellitus   . Hypertension   . Anxiety   . COPD (chronic obstructive pulmonary disease)   . Hyperlipemia   . GERD (gastroesophageal reflux disease)   . Barrett's esophagus with high grade dysplasia     s/p RFA  . PVD (peripheral vascular disease)   . CAD (coronary artery disease)   . Tobacco user   . Depression   . Hiatal hernia   . Myocardial infarction 12/1996  . AAA (abdominal aortic aneurysm)   . Renal insufficiency 2010   Past Surgical History  Procedure Laterality Date  . Tonsillectomy    . Nissen fundoplication    . Abdominal aortic aneurysm repair  2010    and right common iliac artery aneurysm, DR HAYES  . Upper gastrointestinal endoscopy  07/07/2011    barretts esophagus, hiatal hernia, gastritis, ablations in esophagus  . Colonoscopy  05/07/2005    diverticulosis, internal and external hemorrhoids  . Abdominal aortic aneurysm repair    . Radiofrequency ablation  multiple    Barrett's, high-grade dysplasia  . Colonoscopy N/A  03/01/2013    Procedure: COLONOSCOPY;  Surgeon: Gatha Mayer, MD;  Location: [facility];  Service: Endoscopy;  Laterality: N/A;   Prior to Admission medications   Medication Sig Start Date End Date Taking? Authorizing Jarome Trull  ALPRAZolam Duanne Moron) 0.5 MG tablet Take 0.5 mg by mouth 2 (two) times daily as needed for anxiety.   Yes Historical Ashana Tullo, MD  aspirin EC 81 MG tablet Take 81 mg by mouth every morning.    Yes Historical Izza Bickle, MD  atorvastatin (LIPITOR) 40 MG tablet Take 40 mg by mouth every  morning.   Yes Historical Annison Birchard, MD  calcium-vitamin D (OSCAL WITH D) 500-200 MG-UNIT per tablet Take 1 tablet by mouth 3 (three) times daily.   Yes Historical Marlis Oldaker, MD  cholecalciferol (VITAMIN D) 1000 UNITS tablet Take 4,000 Units by mouth every morning.   Yes Historical Jaid Quirion, MD  cloNIDine (CATAPRES) 0.1 MG tablet Take 0.1 mg by mouth 2 (two) times daily.   Yes Historical Adriyana Greenbaum, MD  feeding supplement (RESOURCE BREEZE) LIQD Take 1 Container by mouth 4 (four) times daily -  with meals and at bedtime. 03/29/13  Yes Robbie Lis, MD  FLUoxetine (PROZAC) 10 MG capsule Take 10 mg by mouth every morning.   Yes Historical Aydrian Halpin, MD  gabapentin (NEURONTIN) 100 MG capsule Take 100 mg by mouth 2 (two) times daily.   Yes Historical Saud Bail, MD  glimepiride (AMARYL) 1 MG tablet Take 1 mg by mouth daily with breakfast.   Yes Historical Jakobee Brackins, MD  HYDROcodone-homatropine (HYCODAN) 5-1.5 MG/5ML syrup Take 5 mLs by mouth every 6 (six) hours as needed for cough. 04/26/14  Yes Aleksei Plotnikov V, MD  labetalol (NORMODYNE) 200 MG tablet Take 200 mg by mouth 3 (three) times daily.   Yes Historical Iman Reinertsen, MD  megestrol (MEGACE) 40 MG tablet Take 40 mg by mouth 2 (two) times daily.   Yes Historical Cordney Barstow, MD  mirtazapine (REMERON) 15 MG tablet Take 15 mg by mouth at bedtime.   Yes Historical Chan Rosasco, MD  Multiple Vitamin (MULTIVITAMIN WITH MINERALS) TABS tablet Take 1 tablet by mouth every morning.   Yes Historical Tyrik Stetzer, MD  nortriptyline (PAMELOR) 10 MG capsule Take 20 mg by mouth at bedtime.   Yes Historical Alize Borrayo, MD  omeprazole (PRILOSEC) 40 MG capsule Take 40 mg by mouth 2 (two) times daily.   Yes Historical Bastien Strawser, MD  promethazine (PHENERGAN) 12.5 MG tablet Take 12.5 mg by mouth every 6 (six) hours as needed for nausea or vomiting.   Yes Historical Kailene Steinhart, MD  sodium bicarbonate 650 MG tablet Take 1,300 mg by mouth every morning.    Yes Historical Panagiota Perfetti, MD  temazepam  (RESTORIL) 15 MG capsule Take 15-30 mg by mouth at bedtime as needed for sleep.   Yes Historical Clell Trahan, MD  Umeclidinium-Vilanterol (ANORO ELLIPTA) 62.5-25 MCG/INH AEPB Inhale 1 puff into the lungs every morning.   Yes Historical Arkel Cartwright, MD   Allergies  Allergen Reactions  . Amlodipine Besylate Swelling  . Benazepril Hcl Other (See Comments)    elevated K  . Risperidone Other (See Comments)    loss of motor skills  . Tramadol Nausea And Vomiting  . Valsartan Other (See Comments)    elevated K  . Penicillins Itching and Rash    FAMILY HISTORY:  Family History  Problem Relation Age of Onset  . Coronary artery disease Other   . Diabetes Other   . Asthma Other   . Mental illness Mother     dementia  .  Cancer Mother   . Diabetes Mother   . Heart disease Father   . Diabetes Father   . Stomach cancer Father   . Cancer Father   . Colon cancer Neg Hx   . Esophageal cancer Neg Hx    SOCIAL HISTORY:  reports that he has been smoking Cigarettes.  He has a 54 pack-year smoking history. He has never used smokeless tobacco. He reports that he does not drink alcohol or use illicit drugs.  REVIEW OF SYSTEMS:   Constitutional: Negative for fever, chills, weight loss, malaise/fatigue and diaphoresis.  HENT: Negative for hearing loss, ear pain, nosebleeds, congestion, sore throat, neck pain, tinnitus and ear discharge.   Eyes: Negative for blurred vision, double vision, photophobia, pain, discharge and redness.  Respiratory: Negative for hemoptysis,, wheezing and stridor.  See HPI Cardiovascular: Negative for chest pain, palpitations, orthopnea, claudication, leg swelling and PND.  Gastrointestinal: Negative for heartburn, nausea, vomiting, abdominal pain, diarrhea, constipation, blood in stool and melena.  Genitourinary: Negative for dysuria, urgency, frequency, hematuria and flank pain.  Musculoskeletal: Negative for myalgias, back pain, joint pain and falls.  Skin: Negative for  itching and rash.  Neurological: Negative for dizziness, tingling, tremors, sensory change, speech change, focal weakness, seizures, loss of consciousness, weakness and headaches.  Endo/Heme/Allergies: Negative for environmental allergies and polydipsia. Does not bruise/bleed easily.  SUBJECTIVE:   VITAL SIGNS: Temp:  [97.5 F (36.4 C)-102.3 F (39.1 C)] 97.5 F (36.4 C) (06/29 0451) Pulse Rate:  [68-90] 68 (06/29 0451) Resp:  [20-28] 20 (06/29 0451) BP: (160-176)/(66-71) 174/66 mmHg (06/29 0451) SpO2:  [92 %-98 %] 96 % (06/29 0451) Weight:  [178 lb (80.74 kg)] 178 lb (80.74 kg) (06/28 1220)  PHYSICAL EXAMINATION: General:  Elderly male in NAD Neuro:  Flat affect, MAE, non-focal HEENT:  Mm pink/moist, no jvd, no LAN Cardiovascular:  s1s2 rrr, no m/r/g, no jvd Lungs:  resp's even/non-labored, posterior R crackles, clear anterior Abdomen:  Round/soft, bsx4 active Musculoskeletal:  No acute deformities  Skin:  Warm/dry, no edema   Recent Labs Lab 04/29/14 1011 04/29/14 1150 04/30/14 0500  NA 134* 136* 141  K 6.0* 3.8 3.5*  CL 105 100 106  CO2  --  17* 17*  BUN 94* 68* 59*  CREATININE 3.00* 2.60* 2.34*  GLUCOSE 144* 106* 145*    Recent Labs Lab 04/29/14 0959 04/29/14 1011 04/30/14 0500  HGB 8.7* 9.9* 8.3*  HCT 26.7* 29.0* 25.7*  WBC 20.2*  --  19.1*  PLT 474*  --  416*   Ct Abdomen Pelvis Wo Contrast  04/30/2014   CLINICAL DATA:  Abnormal LFTs. Poor p.o. intake. Barrett's. Lung mass. Weight loss. History of AAA. History of COPD.  EXAM: CT ABDOMEN AND PELVIS WITHOUT CONTRAST  TECHNIQUE: Multidetector CT imaging of the abdomen and pelvis was performed following the standard protocol without IV contrast.  COMPARISON:  Multiple prior studies including ultrasound of the kidneys on 04/29/2014, CT of the abdomen and pelvis on 03/14/2013 and earlier  FINDINGS: Lower chest: Partially imaged right middle lobe opacity again noted. There is right pleural effusion associated with  right basilar atelectasis. Coronary artery calcifications are noted.  Upper abdomen: Again noted is aorto iliac stent. There is parenchymal thinning involving the lower pole of the right kidney and the entire left kidney. Left renal cyst is noted, as seen on ultrasound. No focal abnormality identified within the liver or spleen. The adrenal glands are normal in appearance. The gallbladder is present. The pancreas has a normal  appearance.  Bowel: The stomach and small bowel loops have a normal appearance. Colonic loops are normal in appearance. The appendix is well seen and has a normal appearance.  Pelvis: No free pelvic fluid. No retroperitoneal or mesenteric adenopathy. The prostate gland and seminal vesicles have a normal appearance.  Abdominal wall: No abnormality.  Osseous structures: Mild degenerative changes are present in the spine.  IMPRESSION: 1. Partially imaged right middle lobe opacity. 2. Age advanced coronary artery atherosclerosis. Recommend assessment of coronary risk factors and consideration of medical therapy. 3. No evidence for bowel obstruction or abscess.   Electronically Signed   By: Shon Hale M.D.   On: 04/30/2014 10:15   Dg Chest 2 View  04/29/2014   CLINICAL DATA:  Weakness, anorexia  EXAM: CHEST  2 VIEW  COMPARISON:  02/14/2014  FINDINGS: There is nodular mass like consolidation in right lower lobe measures 8.2 cm x 5.4 cm. Further correlation with CT scan of the chest is recommended to exclude a mass. Osteopenia and degenerative changes thoracic spine. Aortic graft.  IMPRESSION: Nodular mass like consolidation in right lower lobe measures 8.2 cm. Further correlation with CT scan of the chest is recommended.   Electronically Signed   By: Lahoma Crocker M.D.   On: 04/29/2014 11:09   Ct Chest Wo Contrast  04/29/2014   CLINICAL DATA:  Weakness.  Anorexia.  EXAM: CT CHEST WITHOUT CONTRAST  TECHNIQUE: Multidetector CT imaging of the chest was performed following the standard protocol  without IV contrast.  COMPARISON:  Chest radiograph 02/14/2014; 04/29/2014.  FINDINGS: Visualized neck base is unremarkable. Probable asymmetric gynecomastia within the left chest wall. 1.4 cm precarinal lymph node. 1.0 cm subcarinal lymph node. Normal heart size. Coronary arterial calcifications. No pericardial effusion. Calcification of the thoracic aorta.  Central airways are patent. Within the right upper lobe there is a spiculated 1.6 x 1.4 cm mass (image 19; series 5) in the area of previously biopsied and predominately resolved infectious process. Along the right fissure between the right upper and right middle lobes there is a large masslike consolidative opacity measuring 10 x 6.8 x 5.7 cm. There is surrounding ground-glass opacity within the right upper and right middle lobes. Additionally there is dependent ground-glass opacity within the bilateral lower lobes. No pleural effusion or pneumothorax.  Incidental imaging of the upper abdomen demonstrates a small hiatal hernia. Atrophy of the left kidney, incompletely evaluated.  No aggressive or acute appearing osseous lesions.  IMPRESSION: 1. Large masslike consolidative opacity within the right upper and right middle lobes measuring up to 10 cm is favored to be infectious in etiology given the interval change between plain film radiographs 02/14/2014 and 04/29/2014. Continued radiographic followup is recommended to ensure resolution. 2. Spiculated masslike opacity within the right upper lobe is likely secondary to scarring from resolved prior infectious right upper lobe process. Recommend further evaluation with dedicated chest CT in 3 months to evaluate for continued interval resolution and/or stability to exclude malignancy. 3. Mildly prominent mediastinal lymph nodes favored to be reactive in etiology. 4. Probable asymmetric gynecomastia within the left chest wall. Recommend clinical and mammographic correlation as clinically indicated if concern for  mass.   Electronically Signed   By: Lovey Newcomer M.D.   On: 04/29/2014 13:22   US Renal  04/30/2014   CLINICAL DATA:  Elevated creatinine. AAA repair. Diabetes, hypertension. History of renal insufficiency.  EXAM: RENAL/URINARY TRACT ULTRASOUND COMPLETE  COMPARISON:  CT of the abdomen and pelvis on 03/14/2013, 03/13/2010  FINDINGS: Right Kidney:  Length: 11.1 cm in length. The lower pole region appear slightly hyperechoic. Morphology of the lower pole appear stable compared multiple prior studies, consistent with possible previous lower pole infarct. Otherwise, echogenicity within normal limits. No mass or hydronephrosis visualized.  Left Kidney:  Length: 10.0 cm. Echogenicity within normal limits. Thin renal parenchyma. Lower pole cyst is again noted which measures 1.7 cm in diameter.  Bladder:  Appears normal for degree of bladder distention.  IMPRESSION: 1. Stable appearance of the right kidney. 2. Stable left lower pole cyst. 3. No hydronephrosis.   Electronically Signed   By: Shon Hale M.D.   On: 04/30/2014 08:16    ASSESSMENT / PLAN:  RUL Spiculated Mass vs Scarring / Infiltrate - previous biopsy negative for malignancy & infiltrate resolved post abx.  Followed by Dr. Gwenette Greet.   RUL / RML  Mass-like consolidation - most likely consistent with PNA given clear cxr in 01/2014.  Tobacco Abuse  Plan: -complete 10 days total abx -follow up 1-2 weeks post abx rx with CT scan of chest for review with Dr. Gwenette Greet -assess sputum culture -no indication for bronchoscopy at this time.  -assess swallowing, concern for aspiration as contribution (pt denies overt symptoms)    Noe Gens, NP-C [facility] Pgr: [phone] or [phone]  Attending:  I have seen and examined the patient with nurse practitioner/resident and agree with the note above.   I think that this is going to turn out to be pneumonia again like last year.  Need to check for aspiration.  Roselie Awkward,  MD [place] [facility] Pager: [phone] Cell: [phone] If no response, call [phone]   04/30/2014, 12:11 PM

## 2014-04-30 NOTE — Progress Notes (Signed)
[facility] PROGRESS NOTE  [name] FBP:[id number] DOB: 06-17-1941 DOA: 04/29/2014 PCP: Walker Kehr, MD  Assessment/Plan: 1-Lung Mass, PNA: will continue with Levaquin and Primaxin. He has history of smoking. Will consult pulmonary for further evaluation. Sputum culture.   2-Acute on chronic renal failure stage III:Per records Cr range 2.0 to 2.3. Component pre renal , decrease volume. Cr peak at 3.0. Renal function improving with IV fluids.   3-Metabolic acidosis: in setting of renal failure. No hypotension less likely related to infection. Will change IV fluids to Bicarb Gtt.   3;Leukocytosis;in setting of PNA. Continue with antibiotics. Follow up blood culture.   4-Transaminases: Hepatitis panel non reactive. CT abdomen unrevealing. Will order RUQ [place]. Repeat labs in am.  Hypertension; continue with clonidine.  Diabetes: SSI. Hold oral hypoglycemic agents.  Anemia: follow trend. Anemia panel;.  DVT prophylaxis; Heparin.   Code Status: DNR Family Communication:  Disposition Plan: Remain inpatient.    Consultants:  Pulmonary   Procedures:  US abdomen:   Antibiotics:  Primaxin 6-28  Levaquin 6-28  HPI/Subjective: Still with decrease appetite. Relates productive cough. No worsening dyspnea.   Objective: Filed Vitals:   04/30/14 0451  BP: 174/66  Pulse: 68  Temp: 97.5 F (36.4 C)  Resp: 20    Intake/Output Summary (Last 24 hours) at 04/30/14 0737 Last data filed at 04/30/14 0647  Gross per 24 hour  Intake   1205 ml  Output    550 ml  Net    655 ml   Filed Weights   04/29/14 1220  Weight: 80.74 kg (178 lb)    Exam:   General:  No distress, alert answering questions.   Cardiovascular: S 1, S 2 RRR  Respiratory: Bilateral crackles, ronchus.   Abdomen: BS present., soft, NT  Musculoskeletal: no edema.   Data Reviewed: Basic Metabolic Panel:  Recent Labs Lab 04/29/14 1011 04/29/14 1150 04/29/14 1200 04/30/14 0500  NA 134*  136*  --  141  K 6.0* 3.8  --  3.5*  CL 105 100  --  106  CO2  --  17*  --  17*  GLUCOSE 144* 106*  --  145*  BUN 94* 68*  --  59*  CREATININE 3.00* 2.60*  --  2.34*  CALCIUM  --  9.5  --  9.4  MG  --   --  2.0  --   PHOS  --   --  3.6  --    Liver Function Tests:  Recent Labs Lab 04/29/14 1150  AST 102*  ALT 131*  ALKPHOS 594*  BILITOT 0.2*  PROT 7.2  ALBUMIN 2.1*   No results found for this basename: LIPASE, AMYLASE,  in the last 168 hours No results found for this basename: AMMONIA,  in the last 168 hours CBC:  Recent Labs Lab 04/29/14 0959 04/29/14 1011 04/30/14 0500  WBC 20.2*  --  19.1*  HGB 8.7* 9.9* 8.3*  HCT 26.7* 29.0* 25.7*  MCV 91.1  --  92.1  PLT 474*  --  416*   Cardiac Enzymes: No results found for this basename: CKTOTAL, CKMB, CKMBINDEX, TROPONINI,  in the last 168 hours BNP (last 3 results) No results found for this basename: PROBNP,  in the last 8760 hours CBG:  Recent Labs Lab 04/29/14 0951 04/29/14 1743 04/29/14 2043 04/30/14 0730  GLUCAP 145* 139* 106* 83    No results found for this or any previous visit (from the past 240 hour(s)).   Studies: Dg Chest  2 View  04/29/2014   CLINICAL DATA:  Weakness, anorexia  EXAM: CHEST  2 VIEW  COMPARISON:  02/14/2014  FINDINGS: There is nodular mass like consolidation in right lower lobe measures 8.2 cm x 5.4 cm. Further correlation with CT scan of the chest is recommended to exclude a mass. Osteopenia and degenerative changes thoracic spine. Aortic graft.  IMPRESSION: Nodular mass like consolidation in right lower lobe measures 8.2 cm. Further correlation with CT scan of the chest is recommended.   Electronically Signed   By: Lahoma Crocker M.D.   On: 04/29/2014 11:09   Ct Chest Wo Contrast  04/29/2014   CLINICAL DATA:  Weakness.  Anorexia.  EXAM: CT CHEST WITHOUT CONTRAST  TECHNIQUE: Multidetector CT imaging of the chest was performed following the standard protocol without IV contrast.  COMPARISON:   Chest radiograph 02/14/2014; 04/29/2014.  FINDINGS: Visualized neck base is unremarkable. Probable asymmetric gynecomastia within the left chest wall. 1.4 cm precarinal lymph node. 1.0 cm subcarinal lymph node. Normal heart size. Coronary arterial calcifications. No pericardial effusion. Calcification of the thoracic aorta.  Central airways are patent. Within the right upper lobe there is a spiculated 1.6 x 1.4 cm mass (image 19; series 5) in the area of previously biopsied and predominately resolved infectious process. Along the right fissure between the right upper and right middle lobes there is a large masslike consolidative opacity measuring 10 x 6.8 x 5.7 cm. There is surrounding ground-glass opacity within the right upper and right middle lobes. Additionally there is dependent ground-glass opacity within the bilateral lower lobes. No pleural effusion or pneumothorax.  Incidental imaging of the upper abdomen demonstrates a small hiatal hernia. Atrophy of the left kidney, incompletely evaluated.  No aggressive or acute appearing osseous lesions.  IMPRESSION: 1. Large masslike consolidative opacity within the right upper and right middle lobes measuring up to 10 cm is favored to be infectious in etiology given the interval change between plain film radiographs 02/14/2014 and 04/29/2014. Continued radiographic followup is recommended to ensure resolution. 2. Spiculated masslike opacity within the right upper lobe is likely secondary to scarring from resolved prior infectious right upper lobe process. Recommend further evaluation with dedicated chest CT in 3 months to evaluate for continued interval resolution and/or stability to exclude malignancy. 3. Mildly prominent mediastinal lymph nodes favored to be reactive in etiology. 4. Probable asymmetric gynecomastia within the left chest wall. Recommend clinical and mammographic correlation as clinically indicated if concern for mass.   Electronically Signed   By:  Lovey Newcomer M.D.   On: 04/29/2014 13:22    Scheduled Meds: . aspirin EC  81 mg Oral q morning - 10a  . calcium-vitamin D  1 tablet Oral TID  . cholecalciferol  4,000 Units Oral q morning - 10a  . cloNIDine  0.1 mg Oral BID  . feeding supplement (RESOURCE BREEZE)  1 Container Oral TID WC & HS  . FLUoxetine  10 mg Oral q morning - 10a  . gabapentin  100 mg Oral BID  . heparin  5,000 Units Subcutaneous 3 times per day  . imipenem-cilastatin  250 mg Intravenous 4 times per day  . insulin aspart  0-9 Units Subcutaneous TID WC  . iohexol  25 mL Oral Q1 Hr x 2  . labetalol  200 mg Oral TID  . [START ON 05/01/2014] levofloxacin  750 mg Oral Q48H  . megestrol  40 mg Oral BID  . mirtazapine  15 mg Oral QHS  . multivitamin with  minerals  1 tablet Oral q morning - 10a  . nortriptyline  20 mg Oral QHS  . pantoprazole  40 mg Oral Daily  . sodium bicarbonate  650 mg Oral TID  . Umeclidinium-Vilanterol  1 puff Inhalation q morning - 10a   Continuous Infusions: . sodium chloride      Active Problems:   DEPRESSION   CORONARY ARTERY DISEASE   DM (diabetes mellitus), type 2 with peripheral vascular complications   Renal failure (ARF), acute on chronic   Right Lung mass   CKD (chronic kidney disease) stage 3, GFR 30-59 ml/min   Protein-calorie malnutrition, severe   Anemia, iron deficiency   Acute renal insufficiency   PNA (pneumonia)   Abnormal LFTs    Time spent: 35 minutes.     Niel Hummer A  [facility] Pager [phone]. If 7PM-7AM, please contact night-coverage at [email or web address], password [facility] 04/30/2014, 7:37 AM  LOS: 1 day

## 2014-04-30 NOTE — Telephone Encounter (Signed)
Noted. Thx.

## 2014-04-30 NOTE — Telephone Encounter (Signed)
Left msg on triage wanting to let md know that pt has been admitted to [facility] due to dehydration & infection in his chest. He is in room 1330...[name]

## 2014-04-30 NOTE — Progress Notes (Signed)
[facility] Floor Coverage PM Note  Called re: temp 102. Reviewed admission labs and imaging. Complex lung mass/infiltrate/scar. Abnormal LFTs. Broadened Abx coverage to include enteric pathogens/acute cholicystitis-PCN allergic-Primaxin ordered. Blood cultures ordered. CT w/o contrast of abdomen ordered.  Lane Hacker, DO Internal Medicine

## 2014-05-01 ENCOUNTER — Telehealth: Payer: Self-pay | Admitting: Pulmonary Disease

## 2014-05-01 ENCOUNTER — Inpatient Hospital Stay (HOSPITAL_COMMUNITY): Payer: Medicare Other

## 2014-05-01 DIAGNOSIS — J189 Pneumonia, unspecified organism: Secondary | ICD-10-CM

## 2014-05-01 DIAGNOSIS — R634 Abnormal weight loss: Secondary | ICD-10-CM

## 2014-05-01 DIAGNOSIS — J209 Acute bronchitis, unspecified: Secondary | ICD-10-CM

## 2014-05-01 LAB — GLUCOSE, CAPILLARY
GLUCOSE-CAPILLARY: 134 mg/dL — AB (ref 70–99)
Glucose-Capillary: 119 mg/dL — ABNORMAL HIGH (ref 70–99)
Glucose-Capillary: 146 mg/dL — ABNORMAL HIGH (ref 70–99)
Glucose-Capillary: 127 mg/dL — ABNORMAL HIGH (ref 70–99)

## 2014-05-01 LAB — COMPREHENSIVE METABOLIC PANEL
ALT: 207 U/L — ABNORMAL HIGH (ref 0–53)
AST: 167 U/L — ABNORMAL HIGH (ref 0–37)
Albumin: 1.9 g/dL — ABNORMAL LOW (ref 3.5–5.2)
Alkaline Phosphatase: 664 U/L — ABNORMAL HIGH (ref 39–117)
BUN: 43 mg/dL — ABNORMAL HIGH (ref 6–23)
CALCIUM: 9 mg/dL (ref 8.4–10.5)
CO2: 21 meq/L (ref 19–32)
CREATININE: 1.96 mg/dL — AB (ref 0.50–1.35)
Chloride: 103 mEq/L (ref 96–112)
GFR, EST AFRICAN AMERICAN: 38 mL/min — AB (ref >90)
GFR, EST NON AFRICAN AMERICAN: 32 mL/min — AB (ref >90)
GLUCOSE: 146 mg/dL — AB (ref 70–99)
Potassium: 3.3 mEq/L — ABNORMAL LOW (ref 3.7–5.3)
Sodium: 140 mEq/L (ref 137–147)
TOTAL PROTEIN: 6.8 g/dL (ref 6.0–8.3)
Total Bilirubin: 0.2 mg/dL — ABNORMAL LOW (ref 0.3–1.2)

## 2014-05-01 LAB — CBC
HCT: 24 % — ABNORMAL LOW (ref 39.0–52.0)
Hemoglobin: 8 g/dL — ABNORMAL LOW (ref 13.0–17.0)
MCH: 30.2 pg (ref 26.0–34.0)
MCHC: 33.3 g/dL (ref 30.0–36.0)
MCV: 90.6 fL (ref 78.0–100.0)
Platelets: 415 10*3/uL — ABNORMAL HIGH (ref 150–400)
RBC: 2.65 MIL/uL — AB (ref 4.22–5.81)
RDW: 14 % (ref 11.5–15.5)
WBC: 19.7 10*3/uL — ABNORMAL HIGH (ref 4.0–10.5)

## 2014-05-01 LAB — FERRITIN: Ferritin: 2882 ng/mL — ABNORMAL HIGH (ref 22–322)

## 2014-05-01 LAB — EXPECTORATED SPUTUM ASSESSMENT W REFEX TO RESP CULTURE

## 2014-05-01 LAB — RETICULOCYTES
RBC.: 2.65 MIL/uL — ABNORMAL LOW (ref 4.22–5.81)
RETIC CT PCT: 1.6 % (ref 0.4–3.1)
Retic Count, Absolute: 42.4 10*3/uL (ref 19.0–186.0)

## 2014-05-01 LAB — GAMMA GT: GGT: 813 U/L — ABNORMAL HIGH (ref 7–51)

## 2014-05-01 LAB — IRON AND TIBC
Iron: 10 ug/dL — ABNORMAL LOW (ref 42–135)
Saturation Ratios: 7 % — ABNORMAL LOW (ref 20–55)
TIBC: 148 ug/dL — AB (ref 215–435)
UIBC: 138 ug/dL (ref 125–400)

## 2014-05-01 LAB — ACETAMINOPHEN LEVEL

## 2014-05-01 LAB — FOLATE: Folate: >20 ng/mL

## 2014-05-01 LAB — VITAMIN B12: VITAMIN B 12: 1080 pg/mL — AB (ref 211–911)

## 2014-05-01 MED ORDER — POTASSIUM CHLORIDE CRYS ER 20 MEQ PO TBCR
20.0000 meq | EXTENDED_RELEASE_TABLET | Freq: Once | ORAL | Status: AC
  Administered 2014-05-01: 20 meq via ORAL
  Filled 2014-05-01: qty 1

## 2014-05-01 MED ORDER — POTASSIUM CHLORIDE CRYS ER 20 MEQ PO TBCR: 40.0000 meq | EXTENDED_RELEASE_TABLET | Freq: Once | ORAL | Status: DC

## 2014-05-01 NOTE — Progress Notes (Addendum)
[facility] PROGRESS NOTE  [name] QQI:[id number] DOB: 1941-05-26 DOA: 04/29/2014 PCP: Walker Kehr, MD  Assessment/Plan: 73 year old with significant for iron deficiency anemia, chronic renal failure stage III, diabetes mellitus, depression, protein calorie malnutrition who presents on 6-28 with cough, fever, poor oral intake. He was found to have acute on chronic renal failure, large mass right upper and middle lobe. He has been getting treatment for PNA. Renal function improved with IV fluids. Regarding Lung mass, PNA pulmonary recommend outpatient follow up. He was also found to have transaminases work up in process.   1-Lung Mass, PNA: will continue with Levaquin and Primaxin day 3 .  Sputum culture with with rares gram positive cocci in pairs and chains, follow final results. Pulmonary recommending out patient imagine follow up. Speech recommend regular thin diet.   2-Acute on chronic renal failure stage III:Per records Cr range 2.0 to 2.3. Component pre renal , decrease volume. Cr peak at 3.0. Renal function improving with IV fluids. Cr decrease to 1.9. Will stop bicarb Gtt.   3-Metabolic acidosis: in setting of renal failure. Resolved on Bicarb Gtt. Will continue with oral bicarb supplement.   3;Leukocytosis;in setting of PNA. Continue with antibiotics.  blood culture no growth to date. Trending down today at 19.  4-Transaminases: Viral Hepatitis panel non reactive. CT abdomen unrevealing. RUQ [place] normal. Repeat labs in am. Will order GGT and tylenol level. He denies alcohol. Will ask pharmacy to review medications.   Hypertension; continue with clonidine.  Diabetes: SSI. Hold oral hypoglycemic agents.  Anemia: follow trend. Anemia panel;.  DVT prophylaxis; Heparin.   Code Status: DNR Family Communication: Care discussed with patient and wife 6-29. Unable to reach wife, called multiple times 6-30.  Disposition Plan: Remain inpatient.    Consultants:  Pulmonary    Procedures:  US abdomen:   Antibiotics:  Primaxin 6-28  Levaquin 6-28  HPI/Subjective: Feeling better. Denies dyspnea. Eating more today. He denies abdominal pain.   Objective: Filed Vitals:   05/01/14 0649  BP: 188/84  Pulse:   Temp:   Resp:     Intake/Output Summary (Last 24 hours) at 05/01/14 1351 Last data filed at 05/01/14 0232  Gross per 24 hour  Intake  617.5 ml  Output    700 ml  Net  -82.5 ml   Filed Weights   04/29/14 1220  Weight: 80.74 kg (178 lb)    Exam:   General:  No distress, alert answering questions.   Cardiovascular: S 1, S 2 RRR  Respiratory: Bilateral crackles, ronchus.   Abdomen: BS present., soft, NT  Musculoskeletal: no edema.   Data Reviewed: Basic Metabolic Panel:  Recent Labs Lab 04/29/14 1011 04/29/14 1150 04/29/14 1200 04/30/14 0500 05/01/14 0455  NA 134* 136*  --  141 140  K 6.0* 3.8  --  3.5* 3.3*  CL 105 100  --  106 103  CO2  --  17*  --  17* 21  GLUCOSE 144* 106*  --  145* 146*  BUN 94* 68*  --  59* 43*  CREATININE 3.00* 2.60*  --  2.34* 1.96*  CALCIUM  --  9.5  --  9.4 9.0  MG  --   --  2.0  --   --   PHOS  --   --  3.6  --   --    Liver Function Tests:  Recent Labs Lab 04/29/14 1150 05/01/14 0455  AST 102* 167*  ALT 131* 207*  ALKPHOS 594* 664*  BILITOT 0.2* 0.2*  PROT 7.2 6.8  ALBUMIN 2.1* 1.9*   No results found for this basename: LIPASE, AMYLASE,  in the last 168 hours No results found for this basename: AMMONIA,  in the last 168 hours CBC:  Recent Labs Lab 04/29/14 0959 04/29/14 1011 04/30/14 0500 05/01/14 0455  WBC 20.2*  --  19.1* 19.7*  HGB 8.7* 9.9* 8.3* 8.0*  HCT 26.7* 29.0* 25.7* 24.0*  MCV 91.1  --  92.1 90.6  PLT 474*  --  416* 415*   Cardiac Enzymes: No results found for this basename: CKTOTAL, CKMB, CKMBINDEX, TROPONINI,  in the last 168 hours BNP (last 3 results) No results found for this basename: PROBNP,  in the last 8760 hours CBG:  Recent Labs Lab  04/30/14 0730 04/30/14 1125 04/30/14 1645 04/30/14 2122 05/01/14 0746  GLUCAP 83 151* 78 104* 119*    Recent Results (from the past 240 hour(s))  CULTURE, BLOOD (ROUTINE X 2)     Status: None   Collection Time    04/29/14 10:05 PM      Result Value Ref Range Status   Specimen Description BLOOD L ARM   Final   Special Requests BOTTLES DRAWN AEROBIC AND ANAEROBIC 10CC EACH   Final   Culture  Setup Time     Final   Value: 04/30/2014 01:48     Performed at [facility]   Culture     Final   Value:        BLOOD CULTURE RECEIVED NO GROWTH TO DATE CULTURE WILL BE HELD FOR 5 DAYS BEFORE ISSUING A FINAL NEGATIVE REPORT     Performed at [facility]   Report Status PENDING   Incomplete  CULTURE, BLOOD (ROUTINE X 2)     Status: None   Collection Time    04/29/14 10:08 PM      Result Value Ref Range Status   Specimen Description BLOOD R ARM   Final   Special Requests BOTTLES DRAWN AEROBIC AND ANAEROBIC 10 CC EACH   Final   Culture  Setup Time     Final   Value: 04/30/2014 01:48     Performed at [facility]   Culture     Final   Value:        BLOOD CULTURE RECEIVED NO GROWTH TO DATE CULTURE WILL BE HELD FOR 5 DAYS BEFORE ISSUING A FINAL NEGATIVE REPORT     Performed at [facility]   Report Status PENDING   Incomplete  CULTURE, EXPECTORATED SPUTUM-ASSESSMENT     Status: None   Collection Time    05/01/14  5:48 AM      Result Value Ref Range Status   Specimen Description SPUTUM   Final   Special Requests NONE   Final   Sputum evaluation     Final   Value: THIS SPECIMEN IS ACCEPTABLE. RESPIRATORY CULTURE REPORT TO FOLLOW.   Report Status 05/01/2014 FINAL   Final  CULTURE, RESPIRATORY (NON-EXPECTORATED)     Status: None   Collection Time    05/01/14  5:48 AM      Result Value Ref Range Status   Specimen Description SPUTUM   Final   Special Requests NONE   Final   Gram Stain     Final   Value: ABUNDANT WBC PRESENT,BOTH PMN AND MONONUCLEAR     NO  SQUAMOUS EPITHELIAL CELLS SEEN     RARE GRAM POSITIVE COCCI     IN PAIRS IN CHAINS  Performed at [facility] PENDING   Incomplete   Report Status PENDING   Incomplete     Studies: Ct Abdomen Pelvis Wo Contrast  04/30/2014   CLINICAL DATA:  Abnormal LFTs. Poor p.o. intake. Barrett's. Lung mass. Weight loss. History of AAA. History of COPD.  EXAM: CT ABDOMEN AND PELVIS WITHOUT CONTRAST  TECHNIQUE: Multidetector CT imaging of the abdomen and pelvis was performed following the standard protocol without IV contrast.  COMPARISON:  Multiple prior studies including ultrasound of the kidneys on 04/29/2014, CT of the abdomen and pelvis on 03/14/2013 and earlier  FINDINGS: Lower chest: Partially imaged right middle lobe opacity again noted. There is right pleural effusion associated with right basilar atelectasis. Coronary artery calcifications are noted.  Upper abdomen: Again noted is aorto iliac stent. There is parenchymal thinning involving the lower pole of the right kidney and the entire left kidney. Left renal cyst is noted, as seen on ultrasound. No focal abnormality identified within the liver or spleen. The adrenal glands are normal in appearance. The gallbladder is present. The pancreas has a normal appearance.  Bowel: The stomach and small bowel loops have a normal appearance. Colonic loops are normal in appearance. The appendix is well seen and has a normal appearance.  Pelvis: No free pelvic fluid. No retroperitoneal or mesenteric adenopathy. The prostate gland and seminal vesicles have a normal appearance.  Abdominal wall: No abnormality.  Osseous structures: Mild degenerative changes are present in the spine.  IMPRESSION: 1. Partially imaged right middle lobe opacity. 2. Age advanced coronary artery atherosclerosis. Recommend assessment of coronary risk factors and consideration of medical therapy. 3. No evidence for bowel obstruction or abscess.   Electronically Signed   By: Shon Hale M.D.   On: 04/30/2014 10:15   US Renal  04/30/2014   CLINICAL DATA:  Elevated creatinine. AAA repair. Diabetes, hypertension. History of renal insufficiency.  EXAM: RENAL/URINARY TRACT ULTRASOUND COMPLETE  COMPARISON:  CT of the abdomen and pelvis on 03/14/2013, 03/13/2010  FINDINGS: Right Kidney:  Length: 11.1 cm in length. The lower pole region appear slightly hyperechoic. Morphology of the lower pole appear stable compared multiple prior studies, consistent with possible previous lower pole infarct. Otherwise, echogenicity within normal limits. No mass or hydronephrosis visualized.  Left Kidney:  Length: 10.0 cm. Echogenicity within normal limits. Thin renal parenchyma. Lower pole cyst is again noted which measures 1.7 cm in diameter.  Bladder:  Appears normal for degree of bladder distention.  IMPRESSION: 1. Stable appearance of the right kidney. 2. Stable left lower pole cyst. 3. No hydronephrosis.   Electronically Signed   By: Shon Hale M.D.   On: 04/30/2014 08:16   US Abdomen Limited Ruq  05/01/2014   CLINICAL DATA:  Elevated LFTs. History of diabetes and hypertension.  EXAM: US ABDOMEN LIMITED - RIGHT UPPER QUADRANT  COMPARISON:  04/30/2014 CT of the abdomen and pelvis  FINDINGS: Gallbladder:  Gallbladder has a normal appearance. Gallbladder wall is 2.1 mm, within normal limits. No stones or pericholecystic fluid. No sonographic Murphy's sign.  Common bile duct:  Diameter: 3.0 mm  Liver:  No focal lesion identified. Within normal limits in parenchymal echogenicity.  IMPRESSION: Normal evaluation of the right upper quadrant.   Electronically Signed   By: Shon Hale M.D.   On: 05/01/2014 10:15    Scheduled Meds: . aspirin EC  81 mg Oral q morning - 10a  . calcium-vitamin D  1 tablet Oral TID  . cholecalciferol  4,000 Units Oral q morning - 10a  . cloNIDine  0.1 mg Oral BID  . feeding supplement (RESOURCE BREEZE)  1 Container Oral BID BM  . FLUoxetine  10 mg Oral q morning - 10a  .  gabapentin  100 mg Oral BID  . heparin  5,000 Units Subcutaneous 3 times per day  . imipenem-cilastatin  250 mg Intravenous 4 times per day  . insulin aspart  0-9 Units Subcutaneous TID WC  . labetalol  200 mg Oral TID  . levofloxacin  750 mg Oral Q48H  . megestrol  40 mg Oral BID  . mirtazapine  15 mg Oral QHS  . multivitamin with minerals  1 tablet Oral q morning - 10a  . nortriptyline  20 mg Oral QHS  . pantoprazole  40 mg Oral Daily  . sodium bicarbonate  650 mg Oral TID  . Umeclidinium-Vilanterol  1 puff Inhalation q morning - 10a   Continuous Infusions:    Active Problems:   DEPRESSION   CORONARY ARTERY DISEASE   DM (diabetes mellitus), type 2 with peripheral vascular complications   Renal failure (ARF), acute on chronic   Right Lung mass   CKD (chronic kidney disease) stage 3, GFR 30-59 ml/min   Protein-calorie malnutrition, severe   Anemia, iron deficiency   Acute renal insufficiency   PNA (pneumonia)   Abnormal LFTs    Time spent: 35 minutes.     Niel Hummer A  [facility] Pager [phone]. If 7PM-7AM, please contact night-coverage at [email or web address], password [facility] 05/01/2014, 1:51 PM  LOS: 2 days

## 2014-05-01 NOTE — Evaluation (Addendum)
Clinical/Bedside Swallow Evaluation Patient Details  Name: [name] MRN: [id number] Date of Birth: 01/24/1941  Today's Date: 05/01/2014 Time: 1050-1117 SLP Time Calculation (min): 27 min  Past Medical History:  Past Medical History  Diagnosis Date  . Diabetes mellitus   . Hypertension   . Anxiety   . COPD (chronic obstructive pulmonary disease)   . Hyperlipemia   . GERD (gastroesophageal reflux disease)   . Barrett's esophagus with high grade dysplasia     s/p RFA  . PVD (peripheral vascular disease)   . CAD (coronary artery disease)   . Tobacco user   . Depression   . Hiatal hernia   . Myocardial infarction 12/1996  . AAA (abdominal aortic aneurysm)   . Renal insufficiency 2010   Past Surgical History:  Past Surgical History  Procedure Laterality Date  . Tonsillectomy    . Nissen fundoplication    . Abdominal aortic aneurysm repair  2010    and right common iliac artery aneurysm, DR HAYES  . Upper gastrointestinal endoscopy  07/07/2011    barretts esophagus, hiatal hernia, gastritis, ablations in esophagus  . Colonoscopy  05/07/2005    diverticulosis, internal and external hemorrhoids  . Abdominal aortic aneurysm repair    . Radiofrequency ablation  multiple    Barrett's, high-grade dysplasia  . Colonoscopy N/A 03/01/2013    Procedure: COLONOSCOPY;  Surgeon: Gatha Mayer, MD;  Location: [facility];  Service: Endoscopy;  Laterality: N/A;   HPI:  73 yo male adm to [facility] with acute renal insufficiency - Pt PMH + for CRF, DM, depression, PVD, HDL, protein malnutrition, Barrett's esophagus s/p multiple ablations, GERD and hiatal hernia s/p Nissen in 1984 per pt, AAA s/p repair.  Pt found to have right lower lobe nodular mass like consolidation - ? scarring right upper lobe ? related to h/o pna. Swallow evaluation ordered due to concerns pt may be aspirating.     Assessment / Plan / Recommendation Clinical Impression  Pt presents with functional oropharyngeal swallow  with negative CN exam and without indications of aspiration/airway compromise or stasis.  Pt denies coughing with intake nor symptoms of reflux, esophageal dysphagia.     Given extensive esophageal hx and pt's reported symptoms, SLP questions if pt may be esophageal deficits resulting in occasional aspiration without awareness.   Pt reports episodes of dry heaving, gagging and nausea issues at home after eating at times.  He also indicated intake is poor due to lack of appetite.   SLP provided pt with compensation strategies as he reports xerostomia which he attributes to medications.  Also provided information re: respiratory conditions and eating and esophageal dysphagia mitigation tips.  Using teach back, all information reviewed adequately.  SLP to sign off.  Thanks for this consult.      Aspiration Risk  Mild (due to respiratory conditition allowing possible episodic aspiration from respiration/swallow discoordination)    Diet Recommendation Regular;Thin liquid (softer foods due to edentulous status (two teeth))   Liquid Administration via: Cup;Straw Medication Administration:  (as tolerated) Supervision: Patient able to self feed Compensations: Slow rate;Small sips/bites;Check for pocketing (consume liquids t/o meal) Postural Changes and/or Swallow Maneuvers: Upright 30-60 min after meal;Seated upright 90 degrees    Other  Recommendations Oral Care Recommendations: Oral care BID   Follow Up Recommendations   n/a    Frequency and Duration   n/a     Pertinent Vitals/Pain Afebrile decreased      Swallow Study Prior Functional Status   see  HHX and clinical impressions    General Date of Onset: 05/01/14 HPI: 73 yo male adm to [facility] with acute renal insufficiency - Pt PMH + for CRF, DM, depression, PVD, HDL, protein malnutrition, Barrett's esophagus s/p multiple ablations, GERD and hiatal hernia s/p Nissen in 1984 per pt, AAA s/p repair.  Pt found to have right lower lobe nodular  mass like consolidation - ? scarring right upper lobe ? related to h/o pna. Swallow evaluation ordered due to concerns pt may be aspirating.   Type of Study: Bedside swallow evaluation Diet Prior to this Study: Regular;Thin liquids Temperature Spikes Noted: Yes (low grade) Respiratory Status: Room air History of Recent Intubation: No Behavior/Cognition: Alert;Cooperative Oral Cavity - Dentition:  (pt has two lower teeth, upper dentures at home that he does not use) Self-Feeding Abilities: Able to feed self Patient Positioning: Upright in bed Baseline Vocal Quality: Clear Volitional Cough: Strong Volitional Swallow: Able to elicit    Oral/Motor/Sensory Function Overall Oral Motor/Sensory Function: Appears within functional limits for tasks assessed   Ice Chips Ice chips: Not tested   Thin Liquid Thin Liquid: Within functional limits Presentation: Cup;Straw    Nectar Thick Nectar Thick Liquid: Not tested   Honey Thick Honey Thick Liquid: Not tested   Puree Puree: Not tested   Solid   GO    Solid: Within functional limits Presentation: Self Fed Other Comments: peaches and bite of muffin       Claudie Fisherman, MS [facility] SLP [phone]    Pt reports his GI Md is Dr Carlean Purl.

## 2014-05-01 NOTE — Progress Notes (Signed)
Pt's BP this AM 196/65 automatic, 190/76 manual. NP on call paged. Received orders to give AM BP meds now. This RN to continue to monitor. Noreene Larsson RN, BSN

## 2014-05-01 NOTE — Progress Notes (Signed)
Upon my 3rd attempt to visit; pt was in bed when I arrived; he said he was about to take a nap and eyes began closing during our brief visit. Will attempt to visit again and refer to pm Chaplain. Pls pg [phone] if assistance is needed prior to visit. Ernest Haber Chaplain  05/01/14 1100  Clinical Encounter Type  Visited With Patient

## 2014-05-01 NOTE — Progress Notes (Signed)
Name: [name] MRN: [id number] DOB: 09-27-1941    ADMISSION DATE:  04/29/2014 CONSULTATION DATE:  04/30/14  REFERRING MD :  Dr. Tyrell Antonio  PRIMARY SERVICE:  TRH   CHIEF COMPLAINT:  Concern for Lung Mass  BRIEF PATIENT DESCRIPTION: 73 y/o M with PMH of heavy tobacco abuse and prior PNA in 03/2013 which appeared as a mass like lesion that cleared with antibiotics (followed by Dr. Gwenette Greet) who presented to [facility] on 6/28 with cough, decreased appetite, weight loss and nausea.  Work up noted concern for new mass like lesions on right, PCCM consulted for evaluation.    SIGNIFICANT EVENTS: 6/28 - admit with cough, decreased appetite, weight loss and nausea.  New mass like lesion on CXR / CT  STUDIES:  02/14/14 - CXR >> CLEARING of previous mass like consolidation  .......................................................................... 6/28 - CT Chest >>large mass like consolidation RUL, RML, spiculated mass RUL - thought to be scarring, mildly prominent mediastinal LAN  6/30 - SLP >> concern for esophageal dysphagia, diet rec's noted for regular diet, thin liquids, soft foods  CULTURES: Sputum 6/29 >>  ANTIBIOTICS: Primaxin 6/28 >> Levaquin 6/28 >>    SUBJECTIVE: Pt denies acute complaints.  Noted fever overnight.    VITAL SIGNS: Temp:  [98.8 F (37.1 C)-100.7 F (38.2 C)] 99.2 F (37.3 C) (06/30 0612) Pulse Rate:  [74-80] 74 (06/30 0503) Resp:  [17-39] 39 (06/30 0503) BP: (157-196)/(53-84) 188/84 mmHg (06/30 0649) SpO2:  [94 %-97 %] 94 % (06/30 0503)  PHYSICAL EXAMINATION: General:  Elderly male in NAD Neuro:  Flat affect, MAE, non-focal HEENT:  Mm pink/moist, no jvd, no LAN Cardiovascular:  s1s2 rrr, no m/r/g, no jvd Lungs:  resp's even/non-labored, few crackles R base Abdomen:  Round/soft, bsx4 active Musculoskeletal:  No acute deformities  Skin:  Warm/dry, no edema   Recent Labs Lab 04/29/14 1150 04/30/14 0500 05/01/14 0455  NA 136* 141 140  K 3.8 3.5*  3.3*  CL 100 106 103  CO2 17* 17* 21  BUN 68* 59* 43*  CREATININE 2.60* 2.34* 1.96*  GLUCOSE 106* 145* 146*    Recent Labs Lab 04/29/14 0959 04/29/14 1011 04/30/14 0500 05/01/14 0455  HGB 8.7* 9.9* 8.3* 8.0*  HCT 26.7* 29.0* 25.7* 24.0*  WBC 20.2*  --  19.1* 19.7*  PLT 474*  --  416* 415*   Ct Abdomen Pelvis Wo Contrast  04/30/2014   CLINICAL DATA:  Abnormal LFTs. Poor p.o. intake. Barrett's. Lung mass. Weight loss. History of AAA. History of COPD.  EXAM: CT ABDOMEN AND PELVIS WITHOUT CONTRAST  TECHNIQUE: Multidetector CT imaging of the abdomen and pelvis was performed following the standard protocol without IV contrast.  COMPARISON:  Multiple prior studies including ultrasound of the kidneys on 04/29/2014, CT of the abdomen and pelvis on 03/14/2013 and earlier  FINDINGS: Lower chest: Partially imaged right middle lobe opacity again noted. There is right pleural effusion associated with right basilar atelectasis. Coronary artery calcifications are noted.  Upper abdomen: Again noted is aorto iliac stent. There is parenchymal thinning involving the lower pole of the right kidney and the entire left kidney. Left renal cyst is noted, as seen on ultrasound. No focal abnormality identified within the liver or spleen. The adrenal glands are normal in appearance. The gallbladder is present. The pancreas has a normal appearance.  Bowel: The stomach and small bowel loops have a normal appearance. Colonic loops are normal in appearance. The appendix is well seen and has a normal appearance.  Pelvis:  No free pelvic fluid. No retroperitoneal or mesenteric adenopathy. The prostate gland and seminal vesicles have a normal appearance.  Abdominal wall: No abnormality.  Osseous structures: Mild degenerative changes are present in the spine.  IMPRESSION: 1. Partially imaged right middle lobe opacity. 2. Age advanced coronary artery atherosclerosis. Recommend assessment of coronary risk factors and consideration of  medical therapy. 3. No evidence for bowel obstruction or abscess.   Electronically Signed   By: Shon Hale M.D.   On: 04/30/2014 10:15   Dg Chest 2 View  04/29/2014   CLINICAL DATA:  Weakness, anorexia  EXAM: CHEST  2 VIEW  COMPARISON:  02/14/2014  FINDINGS: There is nodular mass like consolidation in right lower lobe measures 8.2 cm x 5.4 cm. Further correlation with CT scan of the chest is recommended to exclude a mass. Osteopenia and degenerative changes thoracic spine. Aortic graft.  IMPRESSION: Nodular mass like consolidation in right lower lobe measures 8.2 cm. Further correlation with CT scan of the chest is recommended.   Electronically Signed   By: Lahoma Crocker M.D.   On: 04/29/2014 11:09   Ct Chest Wo Contrast  04/29/2014   CLINICAL DATA:  Weakness.  Anorexia.  EXAM: CT CHEST WITHOUT CONTRAST  TECHNIQUE: Multidetector CT imaging of the chest was performed following the standard protocol without IV contrast.  COMPARISON:  Chest radiograph 02/14/2014; 04/29/2014.  FINDINGS: Visualized neck base is unremarkable. Probable asymmetric gynecomastia within the left chest wall. 1.4 cm precarinal lymph node. 1.0 cm subcarinal lymph node. Normal heart size. Coronary arterial calcifications. No pericardial effusion. Calcification of the thoracic aorta.  Central airways are patent. Within the right upper lobe there is a spiculated 1.6 x 1.4 cm mass (image 19; series 5) in the area of previously biopsied and predominately resolved infectious process. Along the right fissure between the right upper and right middle lobes there is a large masslike consolidative opacity measuring 10 x 6.8 x 5.7 cm. There is surrounding ground-glass opacity within the right upper and right middle lobes. Additionally there is dependent ground-glass opacity within the bilateral lower lobes. No pleural effusion or pneumothorax.  Incidental imaging of the upper abdomen demonstrates a small hiatal hernia. Atrophy of the left kidney,  incompletely evaluated.  No aggressive or acute appearing osseous lesions.  IMPRESSION: 1. Large masslike consolidative opacity within the right upper and right middle lobes measuring up to 10 cm is favored to be infectious in etiology given the interval change between plain film radiographs 02/14/2014 and 04/29/2014. Continued radiographic followup is recommended to ensure resolution. 2. Spiculated masslike opacity within the right upper lobe is likely secondary to scarring from resolved prior infectious right upper lobe process. Recommend further evaluation with dedicated chest CT in 3 months to evaluate for continued interval resolution and/or stability to exclude malignancy. 3. Mildly prominent mediastinal lymph nodes favored to be reactive in etiology. 4. Probable asymmetric gynecomastia within the left chest wall. Recommend clinical and mammographic correlation as clinically indicated if concern for mass.   Electronically Signed   By: Lovey Newcomer M.D.   On: 04/29/2014 13:22   US Renal  04/30/2014   CLINICAL DATA:  Elevated creatinine. AAA repair. Diabetes, hypertension. History of renal insufficiency.  EXAM: RENAL/URINARY TRACT ULTRASOUND COMPLETE  COMPARISON:  CT of the abdomen and pelvis on 03/14/2013, 03/13/2010  FINDINGS: Right Kidney:  Length: 11.1 cm in length. The lower pole region appear slightly hyperechoic. Morphology of the lower pole appear stable compared multiple prior studies, consistent with possible  previous lower pole infarct. Otherwise, echogenicity within normal limits. No mass or hydronephrosis visualized.  Left Kidney:  Length: 10.0 cm. Echogenicity within normal limits. Thin renal parenchyma. Lower pole cyst is again noted which measures 1.7 cm in diameter.  Bladder:  Appears normal for degree of bladder distention.  IMPRESSION: 1. Stable appearance of the right kidney. 2. Stable left lower pole cyst. 3. No hydronephrosis.   Electronically Signed   By: Shon Hale M.D.   On:  04/30/2014 08:16    ASSESSMENT / PLAN:  RUL Spiculated Mass vs Scarring / Infiltrate - previous biopsy negative for malignancy & infiltrate resolved post abx.  Followed by Dr. Gwenette Greet.   RUL / RML  Mass-like consolidation - most likely consistent with PNA given clear cxr in 01/2014.  Tobacco Abuse  Plan: -complete 14 days total abx -if afebrile in am, transition to augmentin to complete abx.  Would keep for one more day post abx change to ensure tolerance -follow up post abx rx with CXR for review with Dr. Gwenette Greet - office will arrange (have notified) -assess sputum culture -no indication for bronchoscopy at this time.  -assess swallowing, concern for aspiration as contribution (pt denies overt symptoms)   -smoking cessation   Noe Gens, NP-C [facility] Pgr: [phone] or [phone]   Attending:  I have seen and examined the patient with nurse practitioner/resident and agree with the note above.   Would change to augmentin tomorrow and watch another 24 hours for fever, clinical decline Per SLP report today would consider barium swallow.  He denies any esophageal symptoms so doubt barium esophogram will be helpful. Have arranged follow up with Dr. Gwenette Greet for 3 weeks from now with CXR  PCCM to sign off, call if ?'s  Roselie Awkward, MD [facility] Pager: [phone] Cell: [phone] If no response, call [phone]

## 2014-05-01 NOTE — Telephone Encounter (Signed)
Staff message sent to triage: Message Received: Today     Donita Brooks, NP P [facility] Triage Pool    Please set up an appt with Dr. Gwenette Greet and outpt CXR for 3 wks from today.   Thanks!  Noe Gens, NP-C  [facility]  Pgr: [phone] or [phone]      [facility] TCB x1 for pt

## 2014-05-01 NOTE — Progress Notes (Signed)
Attempted to visit; pt out of rm. Will attempt again later. Ernest Haber Chaplain  05/01/14 0900  Clinical Encounter Type  Visited With Other (Comment)

## 2014-05-01 NOTE — Clinical Documentation Improvement (Signed)
  Clinical Indicators:  Known history of DM2, Stage 3 CKD, PVD and CAD  02/06/14 - HgbA1C 6.2    Please document any Diabetic Manifestations or Diabetic Associated Conditions in the progress notes and discharge summary, including if the Diabetes is:   - Controlled   - Uncontrolled  Manifestations:   - DM PVD   - DM nephropathy  Other Diabetic Manifestation or Associated Conditions  Unable to Clinically Determine    Thank You,  Erling Conte ,RN BSN CCDS Certified Clinical Documentation Specialist:  [phone] [facility]

## 2014-05-02 DIAGNOSIS — E1159 Type 2 diabetes mellitus with other circulatory complications: Secondary | ICD-10-CM

## 2014-05-02 DIAGNOSIS — D72829 Elevated white blood cell count, unspecified: Secondary | ICD-10-CM

## 2014-05-02 DIAGNOSIS — N189 Chronic kidney disease, unspecified: Secondary | ICD-10-CM

## 2014-05-02 DIAGNOSIS — E43 Unspecified severe protein-calorie malnutrition: Secondary | ICD-10-CM

## 2014-05-02 DIAGNOSIS — E875 Hyperkalemia: Secondary | ICD-10-CM | POA: Diagnosis present

## 2014-05-02 DIAGNOSIS — I798 Other disorders of arteries, arterioles and capillaries in diseases classified elsewhere: Secondary | ICD-10-CM

## 2014-05-02 DIAGNOSIS — N179 Acute kidney failure, unspecified: Secondary | ICD-10-CM

## 2014-05-02 LAB — GLUCOSE, CAPILLARY
GLUCOSE-CAPILLARY: 104 mg/dL — AB (ref 70–99)
GLUCOSE-CAPILLARY: 114 mg/dL — AB (ref 70–99)
Glucose-Capillary: 124 mg/dL — ABNORMAL HIGH (ref 70–99)
Glucose-Capillary: 129 mg/dL — ABNORMAL HIGH (ref 70–99)

## 2014-05-02 LAB — COMPREHENSIVE METABOLIC PANEL
ALT: 242 U/L — ABNORMAL HIGH (ref 0–53)
AST: 212 U/L — ABNORMAL HIGH (ref 0–37)
Albumin: 1.9 g/dL — ABNORMAL LOW (ref 3.5–5.2)
Alkaline Phosphatase: 649 U/L — ABNORMAL HIGH (ref 39–117)
BILIRUBIN TOTAL: 0.2 mg/dL — AB (ref 0.3–1.2)
BUN: 38 mg/dL — AB (ref 6–23)
CHLORIDE: 102 meq/L (ref 96–112)
CO2: 23 mEq/L (ref 19–32)
CREATININE: 1.93 mg/dL — AB (ref 0.50–1.35)
Calcium: 9.3 mg/dL (ref 8.4–10.5)
GFR calc non Af Amer: 33 mL/min — ABNORMAL LOW (ref >90)
GFR, EST AFRICAN AMERICAN: 38 mL/min — AB (ref >90)
Glucose, Bld: 116 mg/dL — ABNORMAL HIGH (ref 70–99)
Potassium: 3.8 mEq/L (ref 3.7–5.3)
Sodium: 142 mEq/L (ref 137–147)
Total Protein: 6.8 g/dL (ref 6.0–8.3)

## 2014-05-02 LAB — CBC
HEMATOCRIT: 24.4 % — AB (ref 39.0–52.0)
Hemoglobin: 7.9 g/dL — ABNORMAL LOW (ref 13.0–17.0)
MCH: 29.5 pg (ref 26.0–34.0)
MCHC: 32.4 g/dL (ref 30.0–36.0)
MCV: 91 fL (ref 78.0–100.0)
Platelets: 452 10*3/uL — ABNORMAL HIGH (ref 150–400)
RBC: 2.68 MIL/uL — AB (ref 4.22–5.81)
RDW: 14.2 % (ref 11.5–15.5)
WBC: 19.7 10*3/uL — AB (ref 4.0–10.5)

## 2014-05-02 MED ORDER — TIOTROPIUM BROMIDE MONOHYDRATE 18 MCG IN CAPS
18.0000 ug | ORAL_CAPSULE | Freq: Every day | RESPIRATORY_TRACT | Status: DC
  Administered 2014-05-03: 18 ug via RESPIRATORY_TRACT
  Filled 2014-05-02: qty 5

## 2014-05-02 MED ORDER — LEVOFLOXACIN 750 MG PO TABS: 750.0000 mg | ORAL_TABLET | ORAL | Status: DC

## 2014-05-02 MED ORDER — HYDRALAZINE HCL 20 MG/ML IJ SOLN
10.0000 mg | Freq: Once | INTRAMUSCULAR | Status: AC
  Administered 2014-05-02: 10 mg via INTRAVENOUS
  Filled 2014-05-02: qty 0.5

## 2014-05-02 MED ORDER — ARFORMOTEROL TARTRATE 15 MCG/2ML IN NEBU
15.0000 ug | INHALATION_SOLUTION | Freq: Two times a day (BID) | RESPIRATORY_TRACT | Status: DC
  Administered 2014-05-02 – 2014-05-03 (×2): 15 ug via RESPIRATORY_TRACT
  Filled 2014-05-02 (×5): qty 2

## 2014-05-02 NOTE — Telephone Encounter (Signed)
[facility] TCB x2

## 2014-05-02 NOTE — Progress Notes (Signed)
Pt BP 210/86. On-call notified. New orders received

## 2014-05-02 NOTE — Progress Notes (Addendum)
ANTIBIOTIC CONSULT NOTE - follow up  Pharmacy Consult for Primaxin/Levaquin Indication: R/O Intra-abdominal infection, CAP  Allergies  Allergen Reactions  . Amlodipine Besylate Swelling  . Benazepril Hcl Other (See Comments)    elevated K  . Risperidone Other (See Comments)    loss of motor skills  . Tramadol Nausea And Vomiting  . Valsartan Other (See Comments)    elevated K  . Penicillins Itching and Rash    Patient Measurements: Height: 5\' 9"  (175.3 cm) Weight: 178 lb (80.74 kg) IBW/kg (Calculated) : 70.7 Adjusted Body Weight:   Vital Signs: Temp: 98.7 F (37.1 C) (07/01 0517) Temp src: Oral (07/01 0517) BP: 158/84 mmHg (07/01 0517) Pulse Rate: 83 (07/01 0517) Intake/Output from previous day: 06/30 0701 - 07/01 0700 In: -  Out: 1050 [Urine:1050] Intake/Output from this shift:    Labs:  Recent Labs  04/30/14 0500 05/01/14 0455 05/02/14 0425  WBC 19.1* 19.7* 19.7*  HGB 8.3* 8.0* 7.9*  PLT 416* 415* 452*  CREATININE 2.34* 1.96* 1.93*   Estimated Creatinine Clearance: 34.6 ml/min (by C-G formula based on Cr of 1.93). No results found for this basename: VANCOTROUGH, Corlis Leak, VANCORANDOM, GENTTROUGH, GENTPEAK, GENTRANDOM, TOBRATROUGH, TOBRAPEAK, TOBRARND, AMIKACINPEAK, AMIKACINTROU, AMIKACIN,  in the last 72 hours   Microbiology: Recent Results (from the past 720 hour(s))  CULTURE, BLOOD (ROUTINE X 2)     Status: None   Collection Time    04/29/14 10:05 PM      Result Value Ref Range Status   Specimen Description BLOOD L ARM   Final   Special Requests BOTTLES DRAWN AEROBIC AND ANAEROBIC 10CC EACH   Final   Culture  Setup Time     Final   Value: 04/30/2014 01:48     Performed at [facility]   Culture     Final   Value:        BLOOD CULTURE RECEIVED NO GROWTH TO DATE CULTURE WILL BE HELD FOR 5 DAYS BEFORE ISSUING A FINAL NEGATIVE REPORT     Performed at [facility]   Report Status PENDING   Incomplete  CULTURE, BLOOD (ROUTINE X 2)      Status: None   Collection Time    04/29/14 10:08 PM      Result Value Ref Range Status   Specimen Description BLOOD R ARM   Final   Special Requests BOTTLES DRAWN AEROBIC AND ANAEROBIC 10 CC EACH   Final   Culture  Setup Time     Final   Value: 04/30/2014 01:48     Performed at [facility]   Culture     Final   Value:        BLOOD CULTURE RECEIVED NO GROWTH TO DATE CULTURE WILL BE HELD FOR 5 DAYS BEFORE ISSUING A FINAL NEGATIVE REPORT     Performed at [facility]   Report Status PENDING   Incomplete  CULTURE, EXPECTORATED SPUTUM-ASSESSMENT     Status: None   Collection Time    05/01/14  5:48 AM      Result Value Ref Range Status   Specimen Description SPUTUM   Final   Special Requests NONE   Final   Sputum evaluation     Final   Value: THIS SPECIMEN IS ACCEPTABLE. RESPIRATORY CULTURE REPORT TO FOLLOW.   Report Status 05/01/2014 FINAL   Final  CULTURE, RESPIRATORY (NON-EXPECTORATED)     Status: None   Collection Time    05/01/14  5:48 AM  Result Value Ref Range Status   Specimen Description SPUTUM   Final   Special Requests NONE   Final   Gram Stain     Final   Value: ABUNDANT WBC PRESENT,BOTH PMN AND MONONUCLEAR     NO SQUAMOUS EPITHELIAL CELLS SEEN     RARE GRAM POSITIVE COCCI     IN PAIRS IN CHAINS     Performed at [facility]   Culture     Final   Value: RARE CANDIDA ALBICANS     Performed at [facility]   Report Status PENDING   Incomplete    Medical History: Past Medical History  Diagnosis Date  . Diabetes mellitus   . Hypertension   . Anxiety   . COPD (chronic obstructive pulmonary disease)   . Hyperlipemia   . GERD (gastroesophageal reflux disease)   . Barrett's esophagus with high grade dysplasia     s/p RFA  . PVD (peripheral vascular disease)   . CAD (coronary artery disease)   . Tobacco user   . Depression   . Hiatal hernia   . Myocardial infarction 12/1996  . AAA (abdominal aortic aneurysm)   . Renal  insufficiency 2010    Medications:  Anti-infectives   Start     Dose/Rate Route Frequency Ordered Stop   05/01/14 1000  levofloxacin (LEVAQUIN) IVPB 750 mg  Status:  Discontinued     750 mg 100 mL/hr over 90 Minutes Intravenous Every 48 hours 04/29/14 1306 04/29/14 1542   05/01/14 1000  levofloxacin (LEVAQUIN) tablet 750 mg     750 mg Oral Every 48 hours 04/29/14 1552     04/29/14 2200  imipenem-cilastatin (PRIMAXIN) 250 mg in sodium chloride 0.9 % 100 mL IVPB     250 mg 200 mL/hr over 30 Minutes Intravenous 4 times per day 04/29/14 2154     04/29/14 1230  levofloxacin (LEVAQUIN) IVPB 750 mg     750 mg 100 mL/hr over 90 Minutes Intravenous NOW 04/29/14 1217 04/29/14 1434     Assessment: 36 yoM presented to ER with cough and poor oral intake admitted 6/28 with suspected CAP vs new lung RLL mass to consult pulmonary. Noted penicillin allergy (itching, rash). Pharmacy consulted to dose Levaquin. After initial dose, Dr. Wendee Beavers specified PO Levaquin.  6/28 >> Levaquin >> 6/28 >> Primaxin >>   Afebrile  WBC 19.7, unchanged  SCr 1.93, CrCl N 35, unchanged  Sputum cx: rare C.albicans  LFTs continue to rise   Goal of Therapy:  Primaxin dosed based on patient weight and renal function   Plan:  1) Continue Primaxin 250mg  IV q6 and Levaquin 750mg  PO q48 for now per current CrCl 2) Note that pulmonary recommends changing abx's to Augmentin today and to complete 14 days of abx's total -  --But note that patient with PCN allergy of rash and itching - Would continue Levaquin or could use clindamycin if want to cover anaerobes 3) After reviewing medications, Primaxin is most likely to be causing rise in LFTs from a medication standpoint.   Adrian Saran, PharmD, BCPS Pager [phone] 05/02/2014 10:57 AM

## 2014-05-02 NOTE — Progress Notes (Signed)
Progress Note   [name] UMP:[id number] DOB: 07-21-1941 DOA: 04/29/2014 PCP: Walker Kehr, MD   Brief Narrative:   [name] is an 73 y.o. male with a PMH of iron deficiency anemia, stage III chronic kidney disease, diabetes mellitus with last hemoglobin A1c 6.2%, and protein calorie malnutrition who was admitted 04/29/14 with poor oral intake, cough, and generalized weakness. Upon initial evaluation in the ED, the patient was noted to have a WBC of 20,000 and a serum creatinine of 2.6.  Assessment/Plan:   Principal Problem:   PNA (pneumonia)  Currently on day #3 of imipenem and Levaquin. History of penicillin allergy. Will narrow to oral Levaquin.  Followup sputum and blood cultures.  WBC remains markedly elevated.  Speech therapy evaluation performed 05/01/14. Mild aspiration risk.  Active Problems:   Normocytic anemia  B12 1080, folate greater than 20, ferritin 2882, iron 10, TIBC 148. Findings consistent with anemia of chronic inflammation/disease.    Transaminitis  Zocor on hold.  Acetaminophen levels not elevated. Acute hepatitis serologies negative.  GGT elevated at 813, but patient denies alcohol abuse.  Abdominal ultrasound done 04/30/14, within normal limits.  Stop Primaxin and monitor.    Hypertension  Continue clonidine and labetalol.    COPD  Continue Brovana and Spiriva.    DEPRESSION  Continue Prozac, Pamelor and Remeron.    CORONARY ARTERY DISEASE  Continue aspirin.  Age advanced coronary artery atherosclerosis noted incidentally on CT scan.    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  Continue Neurontin for neuropathy.  Continue insulin sensitive SSI. CBGs 104-146. Amaryl on hold.    Renal failure (ARF), acute on chronic / stage III chronic kidney disease / metabolic acidosis  /hyperkalemia  Baseline creatinine 2.0. Creatinine now back to usual baseline values.  Continue sodium bicarbonate supplementation. Needed IV bicarbonate supplementation transiently secondary to worsening creatinine/renal function.  Bland sediment on urinalysis.  Hyperkalemia resolved.    Right Lung mass / left gynecomastia  Masslike consolidative opacity noted in the right upper and middle lobes, likely infectious in etiology.  Followup chest CT in 3 months recommended.  Evaluated by [facility] 04/30/2014 with recommendations to complete 10 days of antibiotics and to follow up with pulmonology 1-2 weeks post antibiotics. No current recommendations for bronchoscopy.   Seen by speech therapist 05/01/14 per pulmonology recommendations.    Protein-calorie malnutrition, severe  Continue resource nutritional supplements.  Continue Megace and Remeron.    DVT Prophylaxis  Continue subcutaneous heparin.  Code Status: Full. Family Communication: No family at the bedside. Disposition Plan: Home when stable.   IV Access:    Peripheral IV   Procedures:    Radiographs and abdominal ultrasound with findings as noted below.   Medical Consultants:    Dr. Simonne Maffucci, PCCM   Other Consultants:    Speech therapy: Regular diet/thin liquids.  Occupational therapy: Home health OT.  Physical therapy: No PT followup.   Anti-Infectives:    Primaxin 04/29/14--->  Levaquin 04/29/14--->  Subjective:   [name] reports a nonproductive cough, but no dyspnea. No nausea or vomiting. Appetite remains poor. Bowels moved today.   Objective:    Filed Vitals:   05/01/14 0649 05/01/14 1401 05/01/14 2111 05/02/14 0517  BP: 188/84 188/68 162/84 158/84  Pulse:  71 72 83  Temp:  98.5 F (36.9 C) 99.3 F (37.4 C) 98.7 F (37.1 C)  TempSrc:  Oral Oral Oral  Resp:  22 20 19   Height:      Weight:      SpO2:  94% 98% 98%    Intake/Output Summary (Last 24 hours) at  05/02/14 1208 Last data filed at 05/02/14 0517  Gross per 24 hour  Intake      0 ml  Output   1050 ml  Net  -1050 ml    Exam: Gen:  NAD Cardiovascular:  RRR, No M/R/G Respiratory:  Lungs CTAB Gastrointestinal:  Abdomen soft, NT/ND, + BS Extremities:  No C/E/C   Data Reviewed:    Labs: Basic Metabolic Panel:  Recent Labs Lab 04/29/14 1011 04/29/14 1150 04/29/14 1200 04/30/14 0500 05/01/14 0455 05/02/14 0425  NA 134* 136*  --  141 140 142  K 6.0* 3.8  --  3.5* 3.3* 3.8  CL 105 100  --  106 103 102  CO2  --  17*  --  17* 21 23  GLUCOSE 144* 106*  --  145* 146* 116*  BUN 94* 68*  --  59* 43* 38*  CREATININE 3.00* 2.60*  --  2.34* 1.96* 1.93*  CALCIUM  --  9.5  --  9.4 9.0 9.3  MG  --   --  2.0  --   --   --   PHOS  --   --  3.6  --   --   --    GFR Estimated Creatinine Clearance: 34.6 ml/min (by C-G formula based on Cr of 1.93). Liver Function Tests:  Recent Labs Lab 04/29/14 1150 05/01/14 0455 05/02/14 0425  AST 102* 167* 212*  ALT 131* 207* 242*  ALKPHOS 594* 664* 649*  BILITOT 0.2* 0.2* 0.2*  PROT 7.2 6.8 6.8  ALBUMIN 2.1* 1.9* 1.9*    CBC:  Recent Labs Lab 04/29/14 0959 04/29/14 1011 04/30/14 0500 05/01/14 0455 05/02/14 0425  WBC 20.2*  --  19.1* 19.7* 19.7*  HGB 8.7* 9.9* 8.3* 8.0* 7.9*  HCT 26.7* 29.0* 25.7* 24.0* 24.4*  MCV 91.1  --  92.1 90.6 91.0  PLT 474*  --  416* 415* 452*   CBG:  Recent Labs Lab 05/01/14 0746 05/01/14 1225 05/01/14 1656 05/01/14 2308 05/02/14 0734  GLUCAP 119* 134* 146* 127* 104*   Anemia work up:  Recent Labs  05/01/14 0455  VITAMINB12 1080*  FOLATE >20.0  FERRITIN 2882*  TIBC 148*  IRON 10*  RETICCTPCT 1.6   Sepsis Labs:  Recent Labs Lab 04/29/14 0959 04/30/14 0500 05/01/14 0455 05/02/14 0425  WBC 20.2* 19.1* 19.7* 19.7*   Microbiology Recent Results (from the past 240 hour(s))  CULTURE, BLOOD (ROUTINE X 2)     Status: None   Collection Time    04/29/14 10:05 PM      Result Value  Ref Range Status   Specimen Description BLOOD L ARM   Final   Special Requests BOTTLES DRAWN AEROBIC AND ANAEROBIC 10CC EACH   Final   Culture  Setup Time     Final   Value: 04/30/2014 01:48     Performed at [facility]   Culture     Final   Value:        BLOOD CULTURE RECEIVED NO GROWTH TO DATE CULTURE WILL BE HELD FOR 5 DAYS BEFORE ISSUING A FINAL NEGATIVE REPORT     Performed at [facility]   Report Status PENDING   Incomplete  CULTURE, BLOOD (ROUTINE X 2)     Status: None  Collection Time    04/29/14 10:08 PM      Result Value Ref Range Status   Specimen Description BLOOD R ARM   Final   Special Requests BOTTLES DRAWN AEROBIC AND ANAEROBIC 10 CC EACH   Final   Culture  Setup Time     Final   Value: 04/30/2014 01:48     Performed at [facility]   Culture     Final   Value:        BLOOD CULTURE RECEIVED NO GROWTH TO DATE CULTURE WILL BE HELD FOR 5 DAYS BEFORE ISSUING A FINAL NEGATIVE REPORT     Performed at [facility]   Report Status PENDING   Incomplete  CULTURE, EXPECTORATED SPUTUM-ASSESSMENT     Status: None   Collection Time    05/01/14  5:48 AM      Result Value Ref Range Status   Specimen Description SPUTUM   Final   Special Requests NONE   Final   Sputum evaluation     Final   Value: THIS SPECIMEN IS ACCEPTABLE. RESPIRATORY CULTURE REPORT TO FOLLOW.   Report Status 05/01/2014 FINAL   Final  CULTURE, RESPIRATORY (NON-EXPECTORATED)     Status: None   Collection Time    05/01/14  5:48 AM      Result Value Ref Range Status   Specimen Description SPUTUM   Final   Special Requests NONE   Final   Gram Stain     Final   Value: ABUNDANT WBC PRESENT,BOTH PMN AND MONONUCLEAR     NO SQUAMOUS EPITHELIAL CELLS SEEN     RARE GRAM POSITIVE COCCI     IN PAIRS IN CHAINS     Performed at [facility]   Culture     Final   Value: RARE CANDIDA ALBICANS     Performed at [facility]   Report Status PENDING   Incomplete      Radiographs/Studies:   Ct Abdomen Pelvis Wo Contrast  04/30/2014   CLINICAL DATA:  Abnormal LFTs. Poor p.o. intake. Barrett's. Lung mass. Weight loss. History of AAA. History of COPD.  EXAM: CT ABDOMEN AND PELVIS WITHOUT CONTRAST  TECHNIQUE: Multidetector CT imaging of the abdomen and pelvis was performed following the standard protocol without IV contrast.  COMPARISON:  Multiple prior studies including ultrasound of the kidneys on 04/29/2014, CT of the abdomen and pelvis on 03/14/2013 and earlier  FINDINGS: Lower chest: Partially imaged right middle lobe opacity again noted. There is right pleural effusion associated with right basilar atelectasis. Coronary artery calcifications are noted.  Upper abdomen: Again noted is aorto iliac stent. There is parenchymal thinning involving the lower pole of the right kidney and the entire left kidney. Left renal cyst is noted, as seen on ultrasound. No focal abnormality identified within the liver or spleen. The adrenal glands are normal in appearance. The gallbladder is present. The pancreas has a normal appearance.  Bowel: The stomach and small bowel loops have a normal appearance. Colonic loops are normal in appearance. The appendix is well seen and has a normal appearance.  Pelvis: No free pelvic fluid. No retroperitoneal or mesenteric adenopathy. The prostate gland and seminal vesicles have a normal appearance.  Abdominal wall: No abnormality.  Osseous structures: Mild degenerative changes are present in the spine.  IMPRESSION: 1. Partially imaged right middle lobe opacity. 2. Age advanced coronary artery atherosclerosis. Recommend assessment of coronary risk factors and consideration of medical therapy. 3. No evidence for bowel  obstruction or abscess.   Electronically Signed   By: Shon Hale M.D.   On: 04/30/2014 10:15   Dg Chest 2 View  04/29/2014   CLINICAL DATA:  Weakness, anorexia  EXAM: CHEST  2 VIEW  COMPARISON:  02/14/2014  FINDINGS: There is  nodular mass like consolidation in right lower lobe measures 8.2 cm x 5.4 cm. Further correlation with CT scan of the chest is recommended to exclude a mass. Osteopenia and degenerative changes thoracic spine. Aortic graft.  IMPRESSION: Nodular mass like consolidation in right lower lobe measures 8.2 cm. Further correlation with CT scan of the chest is recommended.   Electronically Signed   By: Lahoma Crocker M.D.   On: 04/29/2014 11:09   Ct Chest Wo Contrast  04/29/2014   CLINICAL DATA:  Weakness.  Anorexia.  EXAM: CT CHEST WITHOUT CONTRAST  TECHNIQUE: Multidetector CT imaging of the chest was performed following the standard protocol without IV contrast.  COMPARISON:  Chest radiograph 02/14/2014; 04/29/2014.  FINDINGS: Visualized neck base is unremarkable. Probable asymmetric gynecomastia within the left chest wall. 1.4 cm precarinal lymph node. 1.0 cm subcarinal lymph node. Normal heart size. Coronary arterial calcifications. No pericardial effusion. Calcification of the thoracic aorta.  Central airways are patent. Within the right upper lobe there is a spiculated 1.6 x 1.4 cm mass (image 19; series 5) in the area of previously biopsied and predominately resolved infectious process. Along the right fissure between the right upper and right middle lobes there is a large masslike consolidative opacity measuring 10 x 6.8 x 5.7 cm. There is surrounding ground-glass opacity within the right upper and right middle lobes. Additionally there is dependent ground-glass opacity within the bilateral lower lobes. No pleural effusion or pneumothorax.  Incidental imaging of the upper abdomen demonstrates a small hiatal hernia. Atrophy of the left kidney, incompletely evaluated.  No aggressive or acute appearing osseous lesions.  IMPRESSION: 1. Large masslike consolidative opacity within the right upper and right middle lobes measuring up to 10 cm is favored to be infectious in etiology given the interval change between plain  film radiographs 02/14/2014 and 04/29/2014. Continued radiographic followup is recommended to ensure resolution. 2. Spiculated masslike opacity within the right upper lobe is likely secondary to scarring from resolved prior infectious right upper lobe process. Recommend further evaluation with dedicated chest CT in 3 months to evaluate for continued interval resolution and/or stability to exclude malignancy. 3. Mildly prominent mediastinal lymph nodes favored to be reactive in etiology. 4. Probable asymmetric gynecomastia within the left chest wall. Recommend clinical and mammographic correlation as clinically indicated if concern for mass.   Electronically Signed   By: Lovey Newcomer M.D.   On: 04/29/2014 13:22   US Renal  04/30/2014   CLINICAL DATA:  Elevated creatinine. AAA repair. Diabetes, hypertension. History of renal insufficiency.  EXAM: RENAL/URINARY TRACT ULTRASOUND COMPLETE  COMPARISON:  CT of the abdomen and pelvis on 03/14/2013, 03/13/2010  FINDINGS: Right Kidney:  Length: 11.1 cm in length. The lower pole region appear slightly hyperechoic. Morphology of the lower pole appear stable compared multiple prior studies, consistent with possible previous lower pole infarct. Otherwise, echogenicity within normal limits. No mass or hydronephrosis visualized.  Left Kidney:  Length: 10.0 cm. Echogenicity within normal limits. Thin renal parenchyma. Lower pole cyst is again noted which measures 1.7 cm in diameter.  Bladder:  Appears normal for degree of bladder distention.  IMPRESSION: 1. Stable appearance of the right kidney. 2. Stable left lower pole cyst. 3.  No hydronephrosis.   Electronically Signed   By: Shon Hale M.D.   On: 04/30/2014 08:16   US Abdomen Limited Ruq  05/01/2014   CLINICAL DATA:  Elevated LFTs. History of diabetes and hypertension.  EXAM: US ABDOMEN LIMITED - RIGHT UPPER QUADRANT  COMPARISON:  04/30/2014 CT of the abdomen and pelvis  FINDINGS: Gallbladder:  Gallbladder has a normal  appearance. Gallbladder wall is 2.1 mm, within normal limits. No stones or pericholecystic fluid. No sonographic Murphy's sign.  Common bile duct:  Diameter: 3.0 mm  Liver:  No focal lesion identified. Within normal limits in parenchymal echogenicity.  IMPRESSION: Normal evaluation of the right upper quadrant.   Electronically Signed   By: Shon Hale M.D.   On: 05/01/2014 10:15    Medications:   . arformoterol  15 mcg Nebulization BID  . aspirin EC  81 mg Oral q morning - 10a  . calcium-vitamin D  1 tablet Oral TID  . cholecalciferol  4,000 Units Oral q morning - 10a  . cloNIDine  0.1 mg Oral BID  . feeding supplement (RESOURCE BREEZE)  1 Container Oral BID BM  . FLUoxetine  10 mg Oral q morning - 10a  . gabapentin  100 mg Oral BID  . heparin  5,000 Units Subcutaneous 3 times per day  . imipenem-cilastatin  250 mg Intravenous 4 times per day  . insulin aspart  0-9 Units Subcutaneous TID WC  . labetalol  200 mg Oral TID  . levofloxacin  750 mg Oral Q48H  . megestrol  40 mg Oral BID  . mirtazapine  15 mg Oral QHS  . multivitamin with minerals  1 tablet Oral q morning - 10a  . nortriptyline  20 mg Oral QHS  . pantoprazole  40 mg Oral Daily  . sodium bicarbonate  650 mg Oral TID  . tiotropium  18 mcg Inhalation Daily   Continuous Infusions:   Time spent: 25 minutes.    LOS: 3 days   [facility] Pager [phone]. If unable to reach me by pager, please call my cell phone at [phone].  *Please refer to [email or web address], password [email or web address] to get updated schedule on who will round on this patient, as hospitalists switch teams weekly. If 7PM-7AM, please contact night-coverage at [email or web address], password [email or web address] for any overnight needs.  05/02/2014, 12:08 PM    **Disclaimer: This note was dictated with voice recognition software. Similar sounding words can inadvertently be transcribed and this note may contain transcription errors which may not have been corrected upon publication  of note.**

## 2014-05-02 NOTE — Evaluation (Signed)
Occupational Therapy Evaluation Patient Details Name: [name] MRN: [id number] DOB: [birth year] Today's Date: 05/02/2014    History of Present Illness [name] is a 73 y.o. male With past medical history significant for iron deficiency anemia, chronic renal failure stage III, diabetes mellitus, depression, protein calorie malnutrition. He is admitted with PNA, acute reanl insufficiency, metabolic acidosis.    Clinical Impression   Pt presents to OT with decreased I with ADL activity due to problems below and will benefit from skilled OT to increase I with ADL activity to return to PLOF   Follow Up Recommendations  Home health OT          Precautions / Restrictions Precautions Precautions: None Precaution Comments: pt reports no falls in past year Restrictions Weight Bearing Restrictions: No      Mobility Bed Mobility Overal bed mobility: Needs Assistance Bed Mobility: Sit to Supine       Sit to supine: Supervision   General bed mobility comments: verbal cues for positioning in the bed  Transfers Overall transfer level: Needs assistance Equipment used: None (IV pole) Transfers: Sit to/from Stand Sit to Stand: Supervision         General transfer comment: VC to push up with arms    Balance Overall balance assessment: Needs assistance   Sitting balance-Leahy Scale: Normal     Standing balance support: Single extremity supported Standing balance-Leahy Scale: Good                              ADL Overall ADL's : Needs assistance/impaired     Grooming: Standing;Supervision/safety   Upper Body Bathing: Supervision/ safety;Sitting   Lower Body Bathing: Min guard;Sit to/from stand   Upper Body Dressing : Set up;Sitting   Lower Body Dressing: Minimal assistance;Sit to/from stand   Toilet Transfer: Min Art therapist Details (indicate cue type and reason): pt did better with BSC over toilet. will get one for home use          Functional mobility during ADLs: Min guard General ADL Comments: overall S- min A with ADL activity      Extremity/Trunk Assessment Upper Extremity Assessment Upper Extremity Assessment: Overall WFL for tasks assessed   Lower Extremity Assessment Lower Extremity Assessment: Overall WFL for tasks assessed   Cervical / Trunk Assessment Cervical / Trunk Assessment: Normal   Communication Communication Communication: No difficulties   Cognition Arousal/Alertness: Awake/alert Behavior During Therapy: WFL for tasks assessed/performed Overall Cognitive Status: Within Functional Limits for tasks assessed                     General Comments               Home Living Family/patient expects to be discharged to:: Private residence Living Arrangements: Spouse/significant other Available Help at Discharge: Family;Available PRN/intermittently (wife works part time) Type of Home: House Home Access: Level entry     [place]: One level     Bathroom Shower/Tub: [place] unit;Walk-in shower   Bathroom Toilet: Standard     Home Equipment: Environmental consultant - 2 wheels;Grab bars - tub/shower          Prior Functioning/Environment Level of Independence: Independent with assistive device(s)        Comments: independent, occaissionally uses RW    OT Diagnosis: Generalized weakness      OT Treatment/Interventions: Self-care/ADL training;Patient/family education;DME and/or AE instruction    OT Goals(Current goals can be  found in the care plan section) Acute Rehab OT Goals Patient Stated Goal: to go home, likes to watch tv- sports OT Goal Formulation: With patient Time For Goal Achievement: 05/16/14 Potential to Achieve Goals: Good  OT Frequency: Min 2X/week              End of Session Nurse Communication: Mobility status  Activity Tolerance: Patient tolerated treatment well Patient left: in chair   Time: 0233-4356 OT Time Calculation (min): 23  min Charges:  OT General Charges $OT Visit: 1 Procedure OT Evaluation $Initial OT Evaluation Tier I: 1 Procedure OT Treatments $Self Care/Home Management : 8-22 mins G-Codes:    Payton Mccallum D 2014/05/20, 10:20 AM

## 2014-05-02 NOTE — Evaluation (Addendum)
Physical Therapy Evaluation Patient Details Name: [name] MRN: [id number] DOB: 1941/08/17 Today's Date: 05/02/2014   History of Present Illness  AMAURI KEEFE is a 73 y.o. male With past medical history significant for iron deficiency anemia, chronic renal failure stage III, diabetes mellitus, depression, protein calorie malnutrition. He is admitted with PNA, acute reanl insufficiency, metabolic acidosis.   Clinical Impression  *Pt admitted with *PNA, lung mass, metabolic acidosis, acute renal failure*. Pt currently with functional limitations due to the deficits listed below (see PT Problem List).  Pt will benefit from skilled PT to increase their independence and safety with mobility to allow discharge to the venue listed below.  Pt was independent with mobility and ADLs prior to admission. Today he walked 100' with RW. It is expected he can return home and be independent with mobility. Will follow during acute stay.   **    Follow Up Recommendations No PT follow up    Equipment Recommendations  None recommended by PT    Recommendations for Other Services       Precautions / Restrictions Precautions Precautions: None Precaution Comments: pt reports no falls in past year Restrictions Weight Bearing Restrictions: No      Mobility  Bed Mobility Overal bed mobility: Modified Independent             General bed mobility comments: HOB elevated 25*  Transfers Overall transfer level: Needs assistance Equipment used: None Transfers: Sit to/from Stand Sit to Stand: Min guard         General transfer comment: min/guard for balance  Ambulation/Gait Ambulation/Gait assistance: Supervision Ambulation Distance (Feet): 100 Feet Assistive device: Rolling walker (2 wheeled)     Gait velocity interpretation: at or above normal speed for age/gender General Gait Details: Pt initially walked without AD but was reaching for UE support so then walked with RW. Steady with  RW.   Stairs            Wheelchair Mobility    Modified Rankin (Stroke Patients Only)       Balance Overall balance assessment: Needs assistance   Sitting balance-Leahy Scale: Normal     Standing balance support: Single extremity supported Standing balance-Leahy Scale: Good                               Pertinent Vitals/Pain **0/10 pain *    Home Living Family/patient expects to be discharged to:: Private residence Living Arrangements: Spouse/significant other Available Help at Discharge: Family;Available PRN/intermittently (wife works part time)   Home Access: Level entry     Home Layout: One level [place]: Environmental consultant - 2 wheels;Grab bars - tub/shower      Prior Function Level of Independence: Independent with assistive device(s)         Comments: independent, occaissionally uses RW     Hand Dominance        Extremity/Trunk Assessment   Upper Extremity Assessment: Overall WFL for tasks assessed           Lower Extremity Assessment: Overall WFL for tasks assessed      Cervical / Trunk Assessment: Normal  Communication   Communication: No difficulties  Cognition Arousal/Alertness: Awake/alert Behavior During Therapy: WFL for tasks assessed/performed Overall Cognitive Status: Within Functional Limits for tasks assessed                      General Comments  Exercises        Assessment/Plan    PT Assessment Patient needs continued PT services  PT Diagnosis Generalized weakness   PT Problem List Decreased activity tolerance;Decreased balance;Decreased mobility  PT Treatment Interventions Gait training;Functional mobility training;Therapeutic activities;Therapeutic exercise   PT Goals (Current goals can be found in the Care Plan section) Acute Rehab PT Goals Patient Stated Goal: to go home, likes to watch tv PT Goal Formulation: With patient Time For Goal Achievement: 05/16/14 Potential to Achieve  Goals: Good    Frequency Min 3X/week   Barriers to discharge        Co-evaluation               End of Session Equipment Utilized During Treatment: Gait belt Activity Tolerance: Patient tolerated treatment well Patient left: in chair;with call bell/phone within reach Nurse Communication: Mobility status         Time: 6553-7482 PT Time Calculation (min): 15 min   Charges:   PT Evaluation $Initial PT Evaluation Tier I: 1 Procedure PT Treatments $Gait Training: 8-22 mins   PT G Codes:          Philomena Doheny 05/02/2014, 9:12 AM [phone]

## 2014-05-03 DIAGNOSIS — E860 Dehydration: Secondary | ICD-10-CM

## 2014-05-03 DIAGNOSIS — N183 Chronic kidney disease, stage 3 unspecified: Secondary | ICD-10-CM

## 2014-05-03 DIAGNOSIS — R627 Adult failure to thrive: Secondary | ICD-10-CM

## 2014-05-03 LAB — COMPREHENSIVE METABOLIC PANEL
ALT: 187 U/L — ABNORMAL HIGH (ref 0–53)
AST: 144 U/L — AB (ref 0–37)
Albumin: 2 g/dL — ABNORMAL LOW (ref 3.5–5.2)
Alkaline Phosphatase: 554 U/L — ABNORMAL HIGH (ref 39–117)
Anion gap: 17 — ABNORMAL HIGH (ref 5–15)
BILIRUBIN TOTAL: 0.2 mg/dL — AB (ref 0.3–1.2)
BUN: 39 mg/dL — AB (ref 6–23)
CHLORIDE: 103 meq/L (ref 96–112)
CO2: 24 meq/L (ref 19–32)
Calcium: 9.1 mg/dL (ref 8.4–10.5)
Creatinine, Ser: 1.99 mg/dL — ABNORMAL HIGH (ref 0.50–1.35)
GFR calc Af Amer: 37 mL/min — ABNORMAL LOW (ref >90)
GFR, EST NON AFRICAN AMERICAN: 32 mL/min — AB (ref >90)
Glucose, Bld: 133 mg/dL — ABNORMAL HIGH (ref 70–99)
Potassium: 3.6 mEq/L — ABNORMAL LOW (ref 3.7–5.3)
Sodium: 144 mEq/L (ref 137–147)
Total Protein: 7 g/dL (ref 6.0–8.3)

## 2014-05-03 LAB — CULTURE, RESPIRATORY W GRAM STAIN

## 2014-05-03 LAB — CBC
HCT: 25.2 % — ABNORMAL LOW (ref 39.0–52.0)
Hemoglobin: 8.1 g/dL — ABNORMAL LOW (ref 13.0–17.0)
MCH: 29.5 pg (ref 26.0–34.0)
MCHC: 32.1 g/dL (ref 30.0–36.0)
MCV: 91.6 fL (ref 78.0–100.0)
PLATELETS: 473 10*3/uL — AB (ref 150–400)
RBC: 2.75 MIL/uL — ABNORMAL LOW (ref 4.22–5.81)
RDW: 14.4 % (ref 11.5–15.5)
WBC: 20.1 10*3/uL — AB (ref 4.0–10.5)

## 2014-05-03 LAB — GLUCOSE, CAPILLARY: GLUCOSE-CAPILLARY: 143 mg/dL — AB (ref 70–99)

## 2014-05-03 LAB — CULTURE, RESPIRATORY

## 2014-05-03 MED ORDER — LEVOFLOXACIN 750 MG PO TABS: 750.0000 mg | ORAL_TABLET | ORAL | Status: DC

## 2014-05-03 MED ORDER — METRONIDAZOLE 500 MG PO TABS
500.0000 mg | ORAL_TABLET | Freq: Three times a day (TID) | ORAL | Status: DC
Start: 2014-05-03 — End: 2014-05-03

## 2014-05-03 MED ORDER — POTASSIUM CHLORIDE CRYS ER 20 MEQ PO TBCR
40.0000 meq | EXTENDED_RELEASE_TABLET | Freq: Once | ORAL | Status: AC
  Administered 2014-05-03: 40 meq via ORAL
  Filled 2014-05-03: qty 2

## 2014-05-03 MED ORDER — HYDRALAZINE HCL 20 MG/ML IJ SOLN
5.0000 mg | Freq: Once | INTRAMUSCULAR | Status: AC
  Administered 2014-05-03: 5 mg via INTRAVENOUS
  Filled 2014-05-03: qty 0.25

## 2014-05-03 MED ORDER — METRONIDAZOLE 500 MG PO TABS: 500.0000 mg | ORAL_TABLET | Freq: Three times a day (TID) | ORAL | Status: DC

## 2014-05-03 NOTE — Care Management Note (Signed)
    Page 1 of 1   05/03/2014     11:38:18 AM CARE MANAGEMENT NOTE 05/03/2014  Patient:  [name], [name]   Account Number:  [id number]  Date Initiated:  04/30/2014  Documentation initiated by:  Sunday Spillers  Subjective/Objective Assessment:   73 yo male admitted with cough and poor oral intake. PTA lived at home with spouse.     Action/Plan:   Home when stable   Anticipated DC Date:  05/03/2014   Anticipated DC Plan:  [place]  CM consult      [facility] Choice  HOME HEALTH   Choice offered to / List presented to:  C-1 Patient        [place] arranged  [place] RN      [facility] agency  [place]   Status of service:  Completed, signed off Medicare Important Message given?  YES (If response is "NO", the following Medicare IM given date fields will be blank) Date Medicare IM given:  05/29/2014 Medicare IM given by:   Date Additional Medicare IM given:  05/02/2014 Additional Medicare IM given by:  [facility] Yuepheng Schaller  Discharge Disposition:  [place]  Per UR Regulation:  Reviewed for med. necessity/level of care/duration of stay  If discussed at [place] of Stay Meetings, dates discussed:    Comments:  05/03/14 10:30 CM met with pt in room to offer choice for home health agency.  Pt chooses [facility] to render HHPT/OT/RN.  Pt does not need any DME.  Address and contact information verified with pt.  Referral called to [facility].  [facility] requested CM to fax facesheet, orders, H&P, DC summary, F-2-F to [facility] Intake [phone].  No other CM needs were communicated.  Mariane Masters, BSN, CM [phone].

## 2014-05-03 NOTE — Discharge Summary (Signed)
Physician Discharge Summary  [name] RCV:[id number] DOB: 02/09/1941 DOA: 04/29/2014  PCP: Walker Kehr, MD  Admit date: 04/29/2014 Discharge date: 05/03/2014   Recommendations for Outpatient Follow-Up:   1. Note: HH PT/OT/RN set up. 2. Dr. Linus Salmons (ID) recommends treatment with Levaquin/Flagyl for 4 weeks. 3. Note: Lipitor on hold secondary to LFT elevation. Recommend followup LFTs in 1-2 weeks. 4. Followup with Dr. Gwenette Greet in 5 weeks to repeat CT chest/post hospitalization F/U. 5. Recommend repeat CBC in one week to ensure resolution of elevated WBC with ongoing treatment.   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  Diet recommendation: Low sodium, heart healthy.  Carbohydrate-modified.     History of Present Illness:   [name] is an 73 y.o. male with a PMH of iron deficiency anemia, stage III chronic kidney disease, diabetes mellitus with last hemoglobin A1c 6.2%, and protein calorie malnutrition who was admitted 04/29/14 with poor oral intake, cough, and generalized weakness. Upon initial evaluation in the ED, the patient was noted to have a WBC of 20,000 and a serum creatinine of 2.6.  Hospital Course by Problem:   Principal Problem:  PNA (pneumonia), suspected aspiration with weakness and failure to thrive  Initially treated with imipenem and  Levaquin. History of penicillin allergy. Antibiotics narrowed to Levaquin on 05/02/14, but with persistent WBC elevation, spoke with Dr. Linus Salmons of ID who recommended 4 weeks of therapy with Levaquin and Flagyl.  Sputum culture grew rare Candida albicans. Blood cultures negative to date.  WBC remains markedly elevated.  Speech therapy evaluation performed 05/01/14. Mild aspiration risk. Home PT/OT/RN set up.  Active Problems:  Normocytic anemia  B12 1080, folate greater than 20, ferritin 2882, iron 10, TIBC 148. Findings consistent with anemia of chronic inflammation/disease.  Transaminitis  Zocor on hold. Patient fully instructed to avoid taking this medication until cleared to do so by PCP. Acetaminophen levels not elevated. Acute hepatitis serologies negative.  GGT elevated at 813, but patient denies alcohol abuse.  Abdominal ultrasound done 04/30/14, within normal limits.  Primaxin discontinued in case contributory.  Hypertension  Continue clonidine and labetalol.  COPD  Treated with Brovana and Spiriva while in the hospital. Resume usual home medications at discharge.  DEPRESSION  Continue Prozac, Pamelor and Remeron.  CORONARY ARTERY DISEASE  Continue aspirin.  Age advanced coronary artery atherosclerosis noted incidentally on CT scan.  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  Continue Neurontin for neuropathy.  Treated with insulin sensitive SSI while in the hospital. Resume Amaryl at discharge.  Renal failure (ARF), acute on chronic / stage III chronic kidney disease / metabolic acidosis /hyperkalemia  Baseline creatinine 2.0. Creatinine now back to usual baseline  values.  Continue sodium bicarbonate supplementation. Needed IV bicarbonate supplementation transiently secondary to worsening creatinine/renal function.  Bland sediment on urinalysis.    Hyperkalemia resolved.  Right Lung mass / left gynecomastia  Masslike consolidative opacity noted in the right upper and middle lobes, likely infectious in etiology.  Followup chest CT in 3 months recommended.  Evaluated by [facility] 04/30/2014 with recommendations to complete 10 days of antibiotics and to follow up with pulmonology 1-2 weeks post antibiotics. No current recommendations for bronchoscopy.  Seen by speech therapist 05/01/14 per pulmonology recommendations. Mild aspiration risk. Spoke with Dr. Linus Salmons of ID who recommended 4 weeks of treatment with Levaquin/lateral at discharge.  Protein-calorie malnutrition, severe  Continue resource nutritional supplements.  Continue Megace and Remeron.   Procedures:    Radiographs as noted below.   Medical Consultants:    Dr. Simonne Maffucci, Pulmonology  Dr. Scharlene Gloss, Infectious Disease (telephone consult only)   Discharge Exam:   Filed Vitals:   05/03/14 0612  BP: 158/80  Pulse:   Temp:   Resp:    Filed Vitals:   05/03/14 0443 05/03/14 0447 05/03/14 0612 05/03/14 0756  BP: 195/75 188/82 158/80   Pulse: 74     Temp: 98.4 F (36.9 C)     TempSrc: Oral     Resp: 16     Height:      Weight:      SpO2: 98%   95%    Gen:  NAD Cardiovascular:  RRR, No M/R/G Respiratory: Lungs CTAB Gastrointestinal: Abdomen soft, NT/ND with normal active bowel sounds. Extremities: No C/E/C    Discharge Instructions:   Discharge Instructions   Call MD for:  difficulty breathing, headache or visual disturbances    Complete by:  As directed      Call MD for:  extreme fatigue    Complete by:  As directed      Call MD for:  temperature >100.4    Complete by:  As directed      Diet - low sodium heart healthy    Complete by:  As directed      Diet Carb Modified    Complete by:  As directed      Discharge instructions    Complete by:  As directed   Do not take Lipitor until you have been cleared to do so by your primary care  doctor.  Avoid alcohol while on Flagyl.  Take Flagyl and Levaquin as prescribed. The Flagyl will be taken 3 times a day and the Levaquin taken every other day for one month. Do not stop these medications unless instructed to do so by Dr. Gwenette Greet.  You will need followup imaging of your chest to ensure complete eradication of the pneumonia and to ensure there is no underlying disease process.  You were cared for by Dr. Jacquelynn Cree  (a hospitalist) during your hospital stay. If you have any questions about your discharge medications or the care you received while you were in the hospital after you are discharged, you can call the unit and ask to speak with the hospitalist on call if the hospitalist that took care of you is not available. Once you are discharged, your primary care physician will handle any further medical issues. Please note that NO REFILLS for any discharge medications will be authorized once you are discharged, as it is imperative that you return to your primary care physician (or establish a relationship with a primary care physician if you do not have  one) for your aftercare needs so that they can reassess your need for medications and monitor your lab values.  Any outstanding tests can be reviewed by your PCP at your follow up visit.  It is also important to review any medicine changes with your PCP.  Please bring these d/c instructions with you to your next visit so your physician can review these changes with you.     Face-to-face encounter (required for Medicare/Medicaid patients)    Complete by:  As directed   I Gordon Vandunk certify that this patient is under my care and that I, or a nurse practitioner or physician's assistant working with me, had a face-to-face encounter that meets the physician face-to-face encounter requirements with this patient on 05/03/2014. The encounter with the patient was in whole, or in part for the following medical condition(s) which is the primary  reason for home health care (List medical condition): The patient has been treated for community-acquired pneumonia, will be alone at times, and seems to be a bit confused about his medications, treatment plan. His wife is concerned that his condition will deteriorate and he will avoid coming back to the hospital, and therefore request a nurse for assessment purposes and to identify problems to prevent rehospitalization. He will also need ongoing teaching regarding disease process and need for tobacco cessation.  The encounter with the patient was in whole, or in part, for the following medical condition, which is the primary reason for home health care:  CAP, deconditioning.  I certify that, based on my findings, the following services are medically necessary home health services:   Nursing Physical therapy    My clinical findings support the need for the above services:  Unable to leave home safely without assistance and/or assistive device  Further, I certify that my clinical findings support that this patient is homebound due to:  Unable to leave home safely without assistance  Reason for Medically Necessary Home Health Services:   Skilled Nursing- Changes in Medication/Medication Management Skilled Nursing- Skilled Assessment/Observation Skilled Nursing- Teaching of Disease Process/Symptom Management Therapy- Therapeutic Exercises to Increase Strength and Endurance       Home Health    Complete by:  As directed   To provide the following care/treatments:   PT OT RN       Increase activity slowly    Complete by:  As directed             Medication List    STOP taking these medications       atorvastatin 40 MG tablet  Commonly known as:  LIPITOR      TAKE these medications       ALPRAZolam 0.5 MG tablet  Commonly known as:  XANAX  Take 0.5 mg by mouth 2 (two) times daily as needed for anxiety.     ANORO ELLIPTA 62.5-25 MCG/INH Aepb  Generic drug:  Umeclidinium-Vilanterol    Inhale 1 puff into the lungs every morning.     aspirin EC 81 MG tablet  Take 81 mg by mouth every morning.     calcium-vitamin D 500-200 MG-UNIT per tablet  Commonly known as:  OSCAL WITH D  Take 1 tablet by mouth 3 (three) times daily.     cholecalciferol 1000 UNITS tablet  Commonly known as:  VITAMIN D  Take 4,000 Units by mouth every morning.     cloNIDine 0.1 MG tablet  Commonly known as:  CATAPRES  Take 0.1 mg by mouth 2 (two) times daily.  feeding supplement (RESOURCE BREEZE) Liqd  Take 1 Container by mouth 4 (four) times daily -  with meals and at bedtime.     FLUoxetine 10 MG capsule  Commonly known as:  PROZAC  Take 10 mg by mouth every morning.     gabapentin 100 MG capsule  Commonly known as:  NEURONTIN  Take 100 mg by mouth 2 (two) times daily.     glimepiride 1 MG tablet  Commonly known as:  AMARYL  Take 1 mg by mouth daily with breakfast.     HYDROcodone-homatropine 5-1.5 MG/5ML syrup  Commonly known as:  HYCODAN  Take 5 mLs by mouth every 6 (six) hours as needed for cough.     labetalol 200 MG tablet  Commonly known as:  NORMODYNE  Take 200 mg by mouth 3 (three) times daily.     levofloxacin 750 MG tablet  Commonly known as:  LEVAQUIN  Take 1 tablet (750 mg total) by mouth every other day.     megestrol 40 MG tablet  Commonly known as:  MEGACE  Take 40 mg by mouth 2 (two) times daily.     metroNIDAZOLE 500 MG tablet  Commonly known as:  FLAGYL  Take 1 tablet (500 mg total) by mouth 3 (three) times daily.     mirtazapine 15 MG tablet  Commonly known as:  REMERON  Take 15 mg by mouth at bedtime.     multivitamin with minerals Tabs tablet  Take 1 tablet by mouth every morning.     nortriptyline 10 MG capsule  Commonly known as:  PAMELOR  Take 20 mg by mouth at bedtime.     omeprazole 40 MG capsule  Commonly known as:  PRILOSEC  Take 40 mg by mouth 2 (two) times daily.     promethazine 12.5 MG tablet  Commonly known as:  PHENERGAN   Take 12.5 mg by mouth every 6 (six) hours as needed for nausea or vomiting.     sodium bicarbonate 650 MG tablet  Take 1,300 mg by mouth every morning.     temazepam 15 MG capsule  Commonly known as:  RESTORIL  Take 15-30 mg by mouth at bedtime as needed for sleep.          The results of significant diagnostics from this hospitalization (including imaging, microbiology, ancillary and laboratory) are listed below for reference.     Significant Diagnostic Studies:   Radiographs: Ct Abdomen Pelvis Wo Contrast  04/30/2014   CLINICAL DATA:  Abnormal LFTs. Poor p.o. intake. Barrett's. Lung mass. Weight loss. History of AAA. History of COPD.  EXAM: CT ABDOMEN AND PELVIS WITHOUT CONTRAST  TECHNIQUE: Multidetector CT imaging of the abdomen and pelvis was performed following the standard protocol without IV contrast.  COMPARISON:  Multiple prior studies including ultrasound of the kidneys on 04/29/2014, CT of the abdomen and pelvis on 03/14/2013 and earlier  FINDINGS: Lower chest: Partially imaged right middle lobe opacity again noted. There is right pleural effusion associated with right basilar atelectasis. Coronary artery calcifications are noted.  Upper abdomen: Again noted is aorto iliac stent. There is parenchymal thinning involving the lower pole of the right kidney and the entire left kidney. Left renal cyst is noted, as seen on ultrasound. No focal abnormality identified within the liver or spleen. The adrenal glands are normal in appearance. The gallbladder is present. The pancreas has a normal appearance.  Bowel: The stomach and small bowel loops have a normal appearance. Colonic loops are normal in  appearance. The appendix is well seen and has a normal appearance.  Pelvis: No free pelvic fluid. No retroperitoneal or mesenteric adenopathy. The prostate gland and seminal vesicles have a normal appearance.  Abdominal wall: No abnormality.  Osseous structures: Mild degenerative changes are  present in the spine.  IMPRESSION: 1. Partially imaged right middle lobe opacity. 2. Age advanced coronary artery atherosclerosis. Recommend assessment of coronary risk factors and consideration of medical therapy. 3. No evidence for bowel obstruction or abscess.   Electronically Signed   By: Shon Hale M.D.   On: 04/30/2014 10:15   Dg Chest 2 View  04/29/2014   CLINICAL DATA:  Weakness, anorexia  EXAM: CHEST  2 VIEW  COMPARISON:  02/14/2014  FINDINGS: There is nodular mass like consolidation in right lower lobe measures 8.2 cm x 5.4 cm. Further correlation with CT scan of the chest is recommended to exclude a mass. Osteopenia and degenerative changes thoracic spine. Aortic graft.  IMPRESSION: Nodular mass like consolidation in right lower lobe measures 8.2 cm. Further correlation with CT scan of the chest is recommended.   Electronically Signed   By: Lahoma Crocker M.D.   On: 04/29/2014 11:09   Ct Chest Wo Contrast  04/29/2014   CLINICAL DATA:  Weakness.  Anorexia.  EXAM: CT CHEST WITHOUT CONTRAST  TECHNIQUE: Multidetector CT imaging of the chest was performed following the standard protocol without IV contrast.  COMPARISON:  Chest radiograph 02/14/2014; 04/29/2014.  FINDINGS: Visualized neck base is unremarkable. Probable asymmetric gynecomastia within the left chest wall. 1.4 cm precarinal lymph node. 1.0 cm subcarinal lymph node. Normal heart size. Coronary arterial calcifications. No pericardial effusion. Calcification of the thoracic aorta.  Central airways are patent. Within the right upper lobe there is a spiculated 1.6 x 1.4 cm mass (image 19; series 5) in the area of previously biopsied and predominately resolved infectious process. Along the right fissure between the right upper and right middle lobes there is a large masslike consolidative opacity measuring 10 x 6.8 x 5.7 cm. There is surrounding ground-glass opacity within the right upper and right middle lobes. Additionally there is dependent  ground-glass opacity within the bilateral lower lobes. No pleural effusion or pneumothorax.  Incidental imaging of the upper abdomen demonstrates a small hiatal hernia. Atrophy of the left kidney, incompletely evaluated.  No aggressive or acute appearing osseous lesions.  IMPRESSION: 1. Large masslike consolidative opacity within the right upper and right middle lobes measuring up to 10 cm is favored to be infectious in etiology given the interval change between plain film radiographs 02/14/2014 and 04/29/2014. Continued radiographic followup is recommended to ensure resolution. 2. Spiculated masslike opacity within the right upper lobe is likely secondary to scarring from resolved prior infectious right upper lobe process. Recommend further evaluation with dedicated chest CT in 3 months to evaluate for continued interval resolution and/or stability to exclude malignancy. 3. Mildly prominent mediastinal lymph nodes favored to be reactive in etiology. 4. Probable asymmetric gynecomastia within the left chest wall. Recommend clinical and mammographic correlation as clinically indicated if concern for mass.   Electronically Signed   By: Lovey Newcomer M.D.   On: 04/29/2014 13:22   US Renal  04/30/2014   CLINICAL DATA:  Elevated creatinine. AAA repair. Diabetes, hypertension. History of renal insufficiency.  EXAM: RENAL/URINARY TRACT ULTRASOUND COMPLETE  COMPARISON:  CT of the abdomen and pelvis on 03/14/2013, 03/13/2010  FINDINGS: Right Kidney:  Length: 11.1 cm in length. The lower pole region appear slightly hyperechoic. Morphology  of the lower pole appear stable compared multiple prior studies, consistent with possible previous lower pole infarct. Otherwise, echogenicity within normal limits. No mass or hydronephrosis visualized.  Left Kidney:  Length: 10.0 cm. Echogenicity within normal limits. Thin renal parenchyma. Lower pole cyst is again noted which measures 1.7 cm in diameter.  Bladder:  Appears normal for  degree of bladder distention.  IMPRESSION: 1. Stable appearance of the right kidney. 2. Stable left lower pole cyst. 3. No hydronephrosis.   Electronically Signed   By: Shon Hale M.D.   On: 04/30/2014 08:16   US Abdomen Limited Ruq  05/01/2014   CLINICAL DATA:  Elevated LFTs. History of diabetes and hypertension.  EXAM: US ABDOMEN LIMITED - RIGHT UPPER QUADRANT  COMPARISON:  04/30/2014 CT of the abdomen and pelvis  FINDINGS: Gallbladder:  Gallbladder has a normal appearance. Gallbladder wall is 2.1 mm, within normal limits. No stones or pericholecystic fluid. No sonographic Murphy's sign.  Common bile duct:  Diameter: 3.0 mm  Liver:  No focal lesion identified. Within normal limits in parenchymal echogenicity.  IMPRESSION: Normal evaluation of the right upper quadrant.   Electronically Signed   By: Shon Hale M.D.   On: 05/01/2014 10:15    Labs:  Basic Metabolic Panel:  Recent Labs Lab 04/29/14 1150 04/29/14 1200 04/30/14 0500 05/01/14 0455 05/02/14 0425 05/03/14 0424  NA 136*  --  141 140 142 144  K 3.8  --  3.5* 3.3* 3.8 3.6*  CL 100  --  106 103 102 103  CO2 17*  --  17* 21 23 24   GLUCOSE 106*  --  145* 146* 116* 133*  BUN 68*  --  59* 43* 38* 39*  CREATININE 2.60*  --  2.34* 1.96* 1.93* 1.99*  CALCIUM 9.5  --  9.4 9.0 9.3 9.1  MG  --  2.0  --   --   --   --   PHOS  --  3.6  --   --   --   --    GFR Estimated Creatinine Clearance: 33.6 ml/min (by C-G formula based on Cr of 1.99). Liver Function Tests:  Recent Labs Lab 04/29/14 1150 05/01/14 0455 05/02/14 0425 05/03/14 0424  AST 102* 167* 212* 144*  ALT 131* 207* 242* 187*  ALKPHOS 594* 664* 649* 554*  BILITOT 0.2* 0.2* 0.2* 0.2*  PROT 7.2 6.8 6.8 7.0  ALBUMIN 2.1* 1.9* 1.9* 2.0*   CBC:  Recent Labs Lab 04/29/14 0959 04/29/14 1011 04/30/14 0500 05/01/14 0455 05/02/14 0425 05/03/14 0424  WBC 20.2*  --  19.1* 19.7* 19.7* 20.1*  HGB 8.7* 9.9* 8.3* 8.0* 7.9* 8.1*  HCT 26.7* 29.0* 25.7* 24.0* 24.4* 25.2*    MCV 91.1  --  92.1 90.6 91.0 91.6  PLT 474*  --  416* 415* 452* 473*   CBG:  Recent Labs Lab 05/02/14 0734 05/02/14 1156 05/02/14 1631 05/02/14 2102 05/03/14 0735  GLUCAP 104* 114* 124* 129* 143*   Anemia work up  Recent Labs  05/01/14 0455  VITAMINB12 1080*  FOLATE >20.0  FERRITIN 2882*  TIBC 148*  IRON 10*  RETICCTPCT 1.6   Microbiology Recent Results (from the past 240 hour(s))  CULTURE, BLOOD (ROUTINE X 2)     Status: None   Collection Time    04/29/14 10:05 PM      Result Value Ref Range Status   Specimen Description BLOOD L ARM   Final   Special Requests BOTTLES DRAWN AEROBIC AND ANAEROBIC [place]  Final   Culture  Setup Time     Final   Value: 04/30/2014 01:48     Performed at [facility]   Culture     Final   Value:        BLOOD CULTURE RECEIVED NO GROWTH TO DATE CULTURE WILL BE HELD FOR 5 DAYS BEFORE ISSUING A FINAL NEGATIVE REPORT     Performed at [facility]   Report Status PENDING   Incomplete  CULTURE, BLOOD (ROUTINE X 2)     Status: None   Collection Time    04/29/14 10:08 PM      Result Value Ref Range Status   Specimen Description BLOOD R ARM   Final   Special Requests BOTTLES DRAWN AEROBIC AND ANAEROBIC 10 CC EACH   Final   Culture  Setup Time     Final   Value: 04/30/2014 01:48     Performed at [facility]   Culture     Final   Value:        BLOOD CULTURE RECEIVED NO GROWTH TO DATE CULTURE WILL BE HELD FOR 5 DAYS BEFORE ISSUING A FINAL NEGATIVE REPORT     Performed at [facility]   Report Status PENDING   Incomplete  CULTURE, EXPECTORATED SPUTUM-ASSESSMENT     Status: None   Collection Time    05/01/14  5:48 AM      Result Value Ref Range Status   Specimen Description SPUTUM   Final   Special Requests NONE   Final   Sputum evaluation     Final   Value: THIS SPECIMEN IS ACCEPTABLE. RESPIRATORY CULTURE REPORT TO FOLLOW.   Report Status 05/01/2014 FINAL   Final  CULTURE, RESPIRATORY  (NON-EXPECTORATED)     Status: None   Collection Time    05/01/14  5:48 AM      Result Value Ref Range Status   Specimen Description SPUTUM   Final   Special Requests NONE   Final   Gram Stain     Final   Value: ABUNDANT WBC PRESENT,BOTH PMN AND MONONUCLEAR     NO SQUAMOUS EPITHELIAL CELLS SEEN     RARE GRAM POSITIVE COCCI     IN PAIRS IN CHAINS     Performed at [facility]   Culture     Final   Value: RARE CANDIDA ALBICANS     Performed at [facility]   Report Status 05/03/2014 FINAL   Final    Time coordinating discharge: 40 minutes.  Signed:  Saamir Armstrong  Pager [phone] [facility] 05/03/2014, 10:25 AM

## 2014-05-03 NOTE — Discharge Instructions (Signed)
Pneumonia, Adult  Pneumonia is an infection of the lungs. It may be caused by a germ (virus or bacteria). Some types of pneumonia can spread easily from person to person. This can happen when you cough or sneeze.  HOME CARE   Only take medicine as told by your doctor.   Take your medicine (antibiotics) as told. Finish it even if you start to feel better.   Do not smoke.   You may use a vaporizer or humidifier in your room. This can help loosen thick spit (mucus).   Sleep so you are almost sitting up (semi-upright). This helps reduce coughing.   Rest.  A shot (vaccine) can help prevent pneumonia. Shots are often advised for:   People over 65 years old.   Patients on chemotherapy.   People with long-term (chronic) lung problems.   People with immune system problems.  GET HELP RIGHT AWAY IF:    You are getting worse.   You cannot control your cough, and you are losing sleep.   You cough up blood.   Your pain gets worse, even with medicine.   You have a fever.   Any of your problems are getting worse, not better.   You have shortness of breath or chest pain.  MAKE SURE YOU:    Understand these instructions.   Will watch your condition.   Will get help right away if you are not doing well or get worse.  Document Released: 04/06/2008 Document Revised: 01/11/2012 Document Reviewed: 01/09/2011  ExitCare Patient Information 2015 [employer]. This information is not intended to replace advice given to you by your health care provider. Make sure you discuss any questions you have with your health care provider.

## 2014-05-03 NOTE — Telephone Encounter (Signed)
[id number]. Mclain Freer, CMA  

## 2014-05-03 NOTE — Progress Notes (Signed)
Occupational Therapy Treatment Patient Details Name: [name] MRN: [id number] DOB: Jan 02, 1941 Today's Date: 05/03/2014    History of present illness [name] is a 73 y.o. male With past medical history significant for iron deficiency anemia, chronic renal failure stage III, diabetes mellitus, depression, protein calorie malnutrition. He is admitted with PNA, acute reanl insufficiency, metabolic acidosis.    OT comments  Pt doing well this morning and looking forward to going home  Follow Up Recommendations  Home health OT          Precautions / Restrictions Precautions Precautions: None Precaution Comments: pt reports no falls in past year Restrictions Weight Bearing Restrictions: No       Mobility Bed Mobility Overal bed mobility: Needs Assistance Bed Mobility: Sit to Supine       Sit to supine: Supervision   General bed mobility comments: verbal cues for positioning in the bed  Transfers   Equipment used: None (IV pole) Transfers: Sit to/from Stand Sit to Stand: Supervision         General transfer comment: VC to push up with arms        ADL Overall ADL's : Needs assistance/impaired     Grooming: Standing;Supervision/safety               Lower Body Dressing: Supervision/safety;Sit to/from stand   Toilet Transfer: Nature conservation officer;Ambulation   Toileting- Clothing Manipulation and Hygiene: Set up;Sit to/from stand       Functional mobility during ADLs: Min guard General ADL Comments: wife will provide A 24/7 iniitally                Cognition   Behavior During Therapy: WFL for tasks assessed/performed Overall Cognitive Status: Within Functional Limits for tasks assessed                                      Frequency Min 2X/week     Progress Toward Goals  OT Goals(current goals can now be found in the care plan section)  Progress towards OT goals: Progressing toward goals     Plan Discharge  plan remains appropriate       End of Session     Activity Tolerance Patient tolerated treatment well   Patient Left in chair   Nurse Communication Mobility status        Time: 4098-1191 OT Time Calculation (min): 25 min  Charges: OT General Charges $OT Visit: 1 Procedure OT Treatments $Self Care/Home Management : 23-37 mins  Forney Kleinpeter, Thereasa Parkin 05/03/2014, 8:56 AM

## 2014-05-03 NOTE — Progress Notes (Signed)
Pt was sitting up in chair when I arrived. He was concerned that his wife had not come to pick him up saying "it's almost lunch". Pt only gave short answers (non-conversationalist).  Following lead up questions as prompted he shared he has not "seen his mother since she died."  When asked how long, he said about a week ago; he said he did not get to go to the funeral since he was up here.  Pt did not share much information unless prompted.  We talked briefly about him finding his own way of saying goodbye since he missed her services.  Pt seemed most concerned about getting dressed and getting home.   Ernest Haber Chaplain  05/03/14 0900  Clinical Encounter Type  Visited With Patient  Visit Type Follow-up  Referral From Physician

## 2014-05-03 NOTE — Progress Notes (Signed)
BP 188/82, on-call notified. New orders given

## 2014-05-06 LAB — CULTURE, BLOOD (ROUTINE X 2)
CULTURE: NO GROWTH
Culture: NO GROWTH

## 2014-05-07 ENCOUNTER — Telehealth: Payer: Self-pay

## 2014-05-07 ENCOUNTER — Telehealth: Payer: Self-pay | Admitting: *Deleted

## 2014-05-07 DIAGNOSIS — E1151 Type 2 diabetes mellitus with diabetic peripheral angiopathy without gangrene: Secondary | ICD-10-CM

## 2014-05-07 NOTE — Telephone Encounter (Signed)
Called, spoke with pt - He had a HFU scheduled with [facility] on Aug 6.  We have rescheduled this for May 23, 2014 at 11 am.  Pt is aware to arrive at 10:30 am and have cxr done in basement then come up to 2nd floor to check in for appt with [place].  He is to call back if needed prior to appt. Pt verbalized understanding and voiced no further questions or concerns at this time.  CXR order placed.

## 2014-05-07 NOTE — Telephone Encounter (Signed)
Tried calling PT no answer x's 10 rings...[name]

## 2014-05-07 NOTE — Telephone Encounter (Signed)
Ok Thx 

## 2014-05-07 NOTE — Telephone Encounter (Signed)
Left msg on triage requesting verbal order for PT 2 x's a week for 4 wks. Then 1 x's week for 2 weeks. Is this ok...[name]

## 2014-05-07 NOTE — Telephone Encounter (Signed)
Diabetic bundle- lipid panel ordered

## 2014-05-09 ENCOUNTER — Ambulatory Visit: Payer: Medicare Other

## 2014-05-09 ENCOUNTER — Encounter: Payer: Self-pay | Admitting: Internal Medicine

## 2014-05-09 ENCOUNTER — Ambulatory Visit (INDEPENDENT_AMBULATORY_CARE_PROVIDER_SITE_OTHER): Payer: Medicare Other | Admitting: Internal Medicine

## 2014-05-09 VITALS — BP 100/70 | HR 88 | Temp 98.1°F | Resp 16 | Wt 150.0 lb

## 2014-05-09 DIAGNOSIS — R634 Abnormal weight loss: Secondary | ICD-10-CM

## 2014-05-09 DIAGNOSIS — D509 Iron deficiency anemia, unspecified: Secondary | ICD-10-CM

## 2014-05-09 DIAGNOSIS — N183 Chronic kidney disease, stage 3 unspecified: Secondary | ICD-10-CM

## 2014-05-09 DIAGNOSIS — J690 Pneumonitis due to inhalation of food and vomit: Secondary | ICD-10-CM

## 2014-05-09 DIAGNOSIS — J449 Chronic obstructive pulmonary disease, unspecified: Secondary | ICD-10-CM

## 2014-05-09 DIAGNOSIS — I129 Hypertensive chronic kidney disease with stage 1 through stage 4 chronic kidney disease, or unspecified chronic kidney disease: Secondary | ICD-10-CM

## 2014-05-09 DIAGNOSIS — E1151 Type 2 diabetes mellitus with diabetic peripheral angiopathy without gangrene: Secondary | ICD-10-CM

## 2014-05-09 DIAGNOSIS — N259 Disorder resulting from impaired renal tubular function, unspecified: Secondary | ICD-10-CM

## 2014-05-09 DIAGNOSIS — Z9181 History of falling: Secondary | ICD-10-CM

## 2014-05-09 DIAGNOSIS — R110 Nausea: Secondary | ICD-10-CM

## 2014-05-09 DIAGNOSIS — F17200 Nicotine dependence, unspecified, uncomplicated: Secondary | ICD-10-CM

## 2014-05-09 DIAGNOSIS — K2270 Barrett's esophagus without dysplasia: Secondary | ICD-10-CM

## 2014-05-09 DIAGNOSIS — I798 Other disorders of arteries, arterioles and capillaries in diseases classified elsewhere: Secondary | ICD-10-CM

## 2014-05-09 DIAGNOSIS — R222 Localized swelling, mass and lump, trunk: Secondary | ICD-10-CM

## 2014-05-09 DIAGNOSIS — K22711 Barrett's esophagus with high grade dysplasia: Secondary | ICD-10-CM

## 2014-05-09 DIAGNOSIS — R918 Other nonspecific abnormal finding of lung field: Secondary | ICD-10-CM

## 2014-05-09 DIAGNOSIS — J4489 Other specified chronic obstructive pulmonary disease: Secondary | ICD-10-CM

## 2014-05-09 DIAGNOSIS — E785 Hyperlipidemia, unspecified: Secondary | ICD-10-CM

## 2014-05-09 DIAGNOSIS — R296 Repeated falls: Secondary | ICD-10-CM

## 2014-05-09 LAB — BASIC METABOLIC PANEL
BUN: 42 mg/dL — ABNORMAL HIGH (ref 6–23)
CALCIUM: 9.7 mg/dL (ref 8.4–10.5)
CO2: 20 mEq/L (ref 19–32)
Chloride: 108 mEq/L (ref 96–112)
Creatinine, Ser: 2.5 mg/dL — ABNORMAL HIGH (ref 0.4–1.5)
GFR: 27.55 mL/min — ABNORMAL LOW (ref >60.00)
GLUCOSE: 63 mg/dL — AB (ref 70–99)
Potassium: 4 mEq/L (ref 3.5–5.1)
Sodium: 140 mEq/L (ref 135–145)

## 2014-05-09 LAB — MAGNESIUM: Magnesium: 1.3 mg/dL — ABNORMAL LOW (ref 1.5–2.5)

## 2014-05-09 LAB — CBC WITH DIFFERENTIAL/PLATELET
BASOS ABS: 0.1 10*3/uL (ref 0.0–0.1)
Basophils Relative: 0.4 % (ref 0.0–3.0)
EOS ABS: 0.4 10*3/uL (ref 0.0–0.7)
Eosinophils Relative: 2.4 % (ref 0.0–5.0)
HCT: 28 % — ABNORMAL LOW (ref 39.0–52.0)
HEMOGLOBIN: 9.1 g/dL — AB (ref 13.0–17.0)
LYMPHS PCT: 11.4 % — AB (ref 12.0–46.0)
Lymphs Abs: 2 10*3/uL (ref 0.7–4.0)
MCHC: 32.6 g/dL (ref 30.0–36.0)
MCV: 91.5 fl (ref 78.0–100.0)
MONO ABS: 1.2 10*3/uL — AB (ref 0.1–1.0)
Monocytes Relative: 7 % (ref 3.0–12.0)
NEUTROS ABS: 13.7 10*3/uL — AB (ref 1.4–7.7)
Neutrophils Relative %: 78.8 % — ABNORMAL HIGH (ref 43.0–77.0)
Platelets: 457 10*3/uL — ABNORMAL HIGH (ref 150.0–400.0)
RBC: 3.06 Mil/uL — ABNORMAL LOW (ref 4.22–5.81)
RDW: 14.4 % (ref 11.5–15.5)
WBC: 17.4 10*3/uL — ABNORMAL HIGH (ref 4.0–10.5)

## 2014-05-09 LAB — LIPID PANEL
Cholesterol: 96 mg/dL (ref 0–200)
HDL: 9.8 mg/dL — ABNORMAL LOW (ref >39.00)
LDL Cholesterol: 50 mg/dL (ref 0–99)
NONHDL: 86.2
Total CHOL/HDL Ratio: 10
Triglycerides: 183 mg/dL — ABNORMAL HIGH (ref 0.0–149.0)
VLDL: 36.6 mg/dL (ref 0.0–40.0)

## 2014-05-09 LAB — IBC PANEL
IRON: 40 ug/dL — AB (ref 42–165)
Saturation Ratios: 23.9 % (ref 20.0–50.0)
Transferrin: 119.7 mg/dL — ABNORMAL LOW (ref 212.0–360.0)

## 2014-05-09 NOTE — Assessment & Plan Note (Signed)
No relapse 

## 2014-05-09 NOTE — Assessment & Plan Note (Signed)
Labs  3 mo

## 2014-05-09 NOTE — Assessment & Plan Note (Signed)
Continue with current prescription therapy as reflected on the Med list.  

## 2014-05-09 NOTE — Progress Notes (Signed)
Pre visit review using our clinic review tool, if applicable. No additional management support is needed unless otherwise documented below in the visit note. 

## 2014-05-09 NOTE — Assessment & Plan Note (Signed)
Finish abx F/u w/Dr Gwenette Greet

## 2014-05-09 NOTE — Assessment & Plan Note (Signed)
Still smoking. He may switch to "vaping"...

## 2014-05-09 NOTE — Progress Notes (Signed)
Post - hosp adm f/up (finishing abx):  Subjective:   1. Note: HH PT/OT/RN set up. 2. Dr. Linus Salmons (ID) recommends treatment with Levaquin/Flagyl for 4 weeks. 3. Note: Lipitor on hold secondary to LFT elevation. Recommend followup LFTs in 1-2 weeks. 4. Followup with Dr. Gwenette Greet in 5 weeks to repeat CT chest/post hospitalization F/U. 5. Recommend repeat CBC in one week to ensure resolution of elevated WBC with ongoing treatment. 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  Diet recommendation: Low sodium, heart healthy. Carbohydrate-modified.  History of Present Illness:   [name] is an 73 y.o. male with a PMH of iron deficiency anemia, stage III chronic kidney disease, diabetes mellitus with last hemoglobin A1c 6.2%, and protein calorie malnutrition who was admitted 04/29/14 with poor oral intake, cough, and generalized weakness. Upon initial evaluation in the ED, the patient was noted to have a WBC of 20,000 and a serum creatinine of 2.6.  Hospital Course by Problem:   Principal Problem:  PNA (pneumonia), suspected aspiration with weakness and failure to thrive  Initially treated with imipenem and Levaquin. History of penicillin allergy. Antibiotics narrowed to Levaquin on 05/02/14, but with persistent WBC elevation, spoke with Dr. Linus Salmons of ID who recommended 4 weeks of therapy with Levaquin and Flagyl.  Sputum culture grew rare  Candida albicans. Blood cultures negative to date.  WBC remains markedly elevated.  Speech therapy evaluation performed 05/01/14. Mild aspiration risk.  Home PT/OT/RN set up. Active Problems:  Normocytic anemia  B12 1080, folate greater than 20, ferritin 2882, iron 10, TIBC 148. Findings consistent with anemia of chronic inflammation/disease. Transaminitis  Zocor on hold. Patient fully instructed to avoid taking this medication until cleared to do so by PCP.  Acetaminophen levels not elevated. Acute hepatitis serologies negative.  GGT elevated at 813, but patient denies alcohol abuse.  Abdominal ultrasound done 04/30/14, within normal limits.  Primaxin discontinued in case contributory. Hypertension  Continue clonidine and labetalol. COPD  Treated with Brovana and Spiriva while in the hospital. Resume usual home medications at discharge. DEPRESSION  Continue Prozac, Pamelor and Remeron. CORONARY ARTERY DISEASE  Continue aspirin.  Age advanced coronary artery atherosclerosis noted incidentally on CT scan. 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  Continue Neurontin for neuropathy.  Treated with insulin sensitive SSI while in the hospital. Resume Amaryl at discharge. Renal failure (ARF), acute on chronic / stage III chronic kidney disease / metabolic acidosis /hyperkalemia  Baseline creatinine 2.0. Creatinine now back to usual baseline values.  Continue sodium bicarbonate supplementation. Needed IV bicarbonate supplementation transiently secondary to worsening creatinine/renal function.  Bland sediment on urinalysis.  Hyperkalemia resolved. Right Lung mass / left gynecomastia  Masslike consolidative opacity noted in the right upper and middle lobes, likely infectious in etiology.  Followup chest CT in 3 months recommended.  Evaluated by [facility] 04/30/2014 with  recommendations to complete 10 days of antibiotics and to  follow up with pulmonology 1-2 weeks post antibiotics. No current recommendations for bronchoscopy.  Seen by speech therapist 05/01/14 per pulmonology recommendations. Mild aspiration risk.  Spoke with Dr. Linus Salmons of ID who recommended 4 weeks of treatment with Levaquin/lateral at discharge. Protein-calorie malnutrition, severe  Continue resource nutritional supplements.  Continue Megace and Remeron.       HPI   F/u:  Renal failure - CRF COPD  DM (diabetes mellitus), type 2 with peripheral vascular complications  Normocytic anemia  Tremors of nervous system  Iron deficiency anemia  Skin Ca The patient also presents for a follow-up of  chronic hypertension, chronic dyslipidemia, type 2 diabetes, COPD, Barrett's, CAD controlled with medicines. He had Barrett's ablation x4, one more is due.     Review of Systems  Constitutional: Positive for unexpected weight change. Negative for appetite change and fatigue.  HENT: Negative for congestion, nosebleeds, sneezing and trouble swallowing.   Eyes: Negative for itching and visual disturbance.  Cardiovascular: Negative for palpitations and leg swelling.  Gastrointestinal: Positive for diarrhea. Negative for nausea, vomiting, constipation, blood in stool, abdominal distention and rectal pain.  Genitourinary: Negative for frequency and hematuria.  Musculoskeletal: Negative for back pain, gait problem, joint swelling and neck pain.  Neurological: Negative for dizziness, tremors, speech difficulty and weakness.  Psychiatric/Behavioral: Negative for sleep disturbance, dysphoric mood and agitation. The patient is not nervous/anxious.    Wt Readings from Last 3 Encounters:  05/09/14 150 lb (68.04 kg)  04/29/14 178 lb (80.74 kg)  04/04/14 168 lb (76.204 kg)   BP Readings from Last 3 Encounters:  05/09/14 100/70  05/03/14 158/80  04/04/14 142/68        Objective:   Physical Exam   Constitutional: He is oriented to person, place, and time. He appears well-developed.  HENT:  Head: Normocephalic and atraumatic.  Mouth/Throat: No oropharyngeal exudate.  Eyes: Conjunctivae are normal. Pupils are equal, round, and reactive to light.  Neck: Normal range of motion. No JVD present. No thyromegaly present.  Cardiovascular: Normal rate, regular rhythm, normal heart sounds and intact distal pulses.  Exam reveals no gallop and no friction rub.   No murmur heard. Pulmonary/Chest: Effort normal and breath sounds normal. No respiratory distress. He has no wheezes. He has no rales. He exhibits no tenderness.  Abdominal: Soft. Bowel sounds are normal. He exhibits no distension and no mass. There is no tenderness. There is no rebound and no guarding.  Musculoskeletal: Normal range of motion. He exhibits no edema and no tenderness.  Lymphadenopathy:    He has no cervical adenopathy.  Neurological: He is alert and oriented to person, place, and time. He has normal reflexes. No cranial nerve deficit. He exhibits normal muscle tone. Coordination normal.  Skin: Skin is warm and dry. No rash noted.  Psychiatric: He has a normal mood and affect. His behavior is normal. Judgment and thought content normal.  AKs    Lab Results  Component Value Date   WBC 20.1* 05/03/2014   HGB 8.1* 05/03/2014   HCT 25.2* 05/03/2014   PLT 473* 05/03/2014   GLUCOSE 133* 05/03/2014   CHOL 154 08/11/2012   TRIG 326.0* 08/11/2012   HDL 31.40* 08/11/2012   LDLDIRECT 80.2 08/11/2012   LDLCALC 90 02/10/2011   ALT 187* 05/03/2014   AST 144* 05/03/2014   NA 144 05/03/2014   K 3.6* 05/03/2014   CL 103 05/03/2014   CREATININE 1.99* 05/03/2014   BUN 39* 05/03/2014   CO2 24 05/03/2014   TSH  2.00 02/06/2014   PSA 0.99 04/17/2010   INR 1.27 03/15/2013   HGBA1C 6.2 02/06/2014              Assessment & Plan:

## 2014-05-09 NOTE — Telephone Encounter (Signed)
Tried calling PT again still no answer x's 10 rings. Closing encounter,,,/lmb

## 2014-05-09 NOTE — Assessment & Plan Note (Signed)
F/u w/Dr Clance 

## 2014-05-09 NOTE — Assessment & Plan Note (Signed)
Anoro  °

## 2014-05-10 ENCOUNTER — Other Ambulatory Visit: Payer: Self-pay | Admitting: Internal Medicine

## 2014-05-10 MED ORDER — POLYSACCHARIDE IRON COMPLEX 150 MG PO CAPS: 150.0000 mg | ORAL_CAPSULE | Freq: Every day | ORAL | Status: AC

## 2014-05-10 MED ORDER — MAGNESIUM 300 MG PO CAPS: ORAL_CAPSULE | ORAL | Status: DC

## 2014-05-10 NOTE — Telephone Encounter (Signed)
Prior Auth submitted to pt's ins plan via CoverMyMeds. Will wait for ins decision.

## 2014-05-11 ENCOUNTER — Telehealth: Payer: Self-pay | Admitting: Internal Medicine

## 2014-05-11 NOTE — Telephone Encounter (Signed)
Pt's wife called back.  She wants a call today to let her know how to get this medicine.

## 2014-05-11 NOTE — Telephone Encounter (Signed)
Patient's wife called asking about the patient's PA for his medication megestrol (MEGACE) 40 MG. Please advise.

## 2014-05-11 NOTE — Telephone Encounter (Signed)
Is he taking Remeron. If not - re-start at 15 mg q 5 pm Thx

## 2014-05-11 NOTE — Telephone Encounter (Signed)
Pharmacist called  And stated they have been trying to get PA on this medication. Family is wanting med ASAP. Want to ask md if tablet be suitable it would be a little cheaper...[name]

## 2014-05-12 NOTE — Telephone Encounter (Signed)
Called pt gave him md response. He stated that he has the remeron will start back taking. Inform him that the insurance will not cover the megace & the remeron will help with increase appetite...[name]

## 2014-05-16 ENCOUNTER — Encounter: Payer: Self-pay | Admitting: Internal Medicine

## 2014-05-16 ENCOUNTER — Ambulatory Visit (INDEPENDENT_AMBULATORY_CARE_PROVIDER_SITE_OTHER): Payer: Medicare Other | Admitting: Internal Medicine

## 2014-05-16 VITALS — BP 120/80 | HR 80 | Temp 97.8°F | Resp 16 | Wt 147.0 lb

## 2014-05-16 DIAGNOSIS — J4489 Other specified chronic obstructive pulmonary disease: Secondary | ICD-10-CM

## 2014-05-16 DIAGNOSIS — J449 Chronic obstructive pulmonary disease, unspecified: Secondary | ICD-10-CM

## 2014-05-16 DIAGNOSIS — R630 Anorexia: Secondary | ICD-10-CM | POA: Insufficient documentation

## 2014-05-16 MED ORDER — METHYLPREDNISOLONE ACETATE 80 MG/ML IJ SUSP
80.0000 mg | Freq: Once | INTRAMUSCULAR | Status: AC
  Administered 2014-05-16: 80 mg via INTRAMUSCULAR

## 2014-05-16 MED ORDER — MIRTAZAPINE 15 MG PO TABS: ORAL_TABLET | ORAL | Status: DC

## 2014-05-16 NOTE — Assessment & Plan Note (Signed)
Megace - not covered Diet discussed Cont Remeron

## 2014-05-16 NOTE — Progress Notes (Signed)
Pre visit review using our clinic review tool, if applicable. No additional management support is needed unless otherwise documented below in the visit note. 

## 2014-05-16 NOTE — Patient Instructions (Signed)
Port or [place] or Apperitif 1 oz before lunch, dinner for appetite

## 2014-05-16 NOTE — Progress Notes (Signed)
Subjective:   C/o wt loss, no appetite    HPI   F/u:  Renal failure - CRF COPD  DM (diabetes mellitus), type 2 with peripheral vascular complications  Normocytic anemia  Tremors of nervous system  Iron deficiency anemia  Skin Ca The patient also presents for a follow-up of  chronic hypertension, chronic dyslipidemia, type 2 diabetes, COPD, Barrett's, CAD controlled with medicines. He had Barrett's ablation x4, one more is due.     Review of Systems  Constitutional: Positive for unexpected weight change. Negative for appetite change and fatigue.  HENT: Negative for congestion, nosebleeds, sneezing and trouble swallowing.   Eyes: Negative for itching and visual disturbance.  Cardiovascular: Negative for palpitations and leg swelling.  Gastrointestinal: Positive for diarrhea. Negative for nausea, vomiting, constipation, blood in stool, abdominal distention and rectal pain.  Genitourinary: Negative for frequency and hematuria.  Musculoskeletal: Negative for back pain, gait problem, joint swelling and neck pain.  Neurological: Negative for dizziness, tremors, speech difficulty and weakness.  Psychiatric/Behavioral: Negative for sleep disturbance, dysphoric mood and agitation. The patient is not nervous/anxious.    Wt Readings from Last 3 Encounters:  05/16/14 147 lb (66.679 kg)  05/09/14 150 lb (68.04 kg)  04/29/14 178 lb (80.74 kg)   BP Readings from Last 3 Encounters:  05/09/14 100/70  05/03/14 158/80  04/04/14 142/68        Objective:   Physical Exam  Constitutional: He is oriented to person, place, and time. He appears well-developed.  HENT:  Head: Normocephalic and atraumatic.  Mouth/Throat: No oropharyngeal exudate.  Eyes: Conjunctivae are normal. Pupils are equal, round, and reactive to light.  Neck: Normal range of motion. No JVD present. No thyromegaly present.  Cardiovascular: Normal rate, regular rhythm, normal heart sounds and intact distal pulses.   Exam reveals no gallop and no friction rub.   No murmur heard. Pulmonary/Chest: Effort normal and breath sounds normal. No respiratory distress. He has no wheezes. He has no rales. He exhibits no tenderness.  Abdominal: Soft. Bowel sounds are normal. He exhibits no distension and no mass. There is no tenderness. There is no rebound and no guarding.  Musculoskeletal: Normal range of motion. He exhibits no edema and no tenderness.  Lymphadenopathy:    He has no cervical adenopathy.  Neurological: He is alert and oriented to person, place, and time. He has normal reflexes. No cranial nerve deficit. He exhibits normal muscle tone. Coordination normal.  Skin: Skin is warm and dry. No rash noted.  Psychiatric: He has a normal mood and affect. His behavior is normal. Judgment and thought content normal.  AKs Dyspneic  I personally provided Anoro inhaler use teaching. After the teaching patient was able to demonstrate it's use effectively. All questions were answered    Lab Results  Component Value Date   WBC 17.4* 05/09/2014   HGB 9.1* 05/09/2014   HCT 28.0* 05/09/2014   PLT 457.0* 05/09/2014   GLUCOSE 63* 05/09/2014   CHOL 96 05/09/2014   TRIG 183.0* 05/09/2014   HDL 9.80* 05/09/2014   LDLDIRECT 80.2 08/11/2012   LDLCALC 50 05/09/2014   ALT 187* 05/03/2014   AST 144* 05/03/2014   NA 140 05/09/2014   K 4.0 05/09/2014   CL 108 05/09/2014   CREATININE 2.5* 05/09/2014   BUN 42* 05/09/2014   CO2 20 05/09/2014   TSH 2.00 02/06/2014   PSA 0.99 04/17/2010   INR 1.27 03/15/2013   HGBA1C 6.2 02/06/2014  Assessment & Plan:

## 2014-05-21 ENCOUNTER — Telehealth: Payer: Self-pay | Admitting: *Deleted

## 2014-05-21 NOTE — Telephone Encounter (Signed)
Notified pt wife with md response. Pt will increase to twice a day...[name]

## 2014-05-21 NOTE — Telephone Encounter (Signed)
Left msg on triage stating wanting to inform md that husband BS has been increasing. This morning BS was 269. Requesting md recommendation...Johny Chess

## 2014-05-21 NOTE — Telephone Encounter (Signed)
Is he taking Glimepiride 1 mg qd now? Take Glimepiride 1 mg bid if "yes" Thx

## 2014-05-23 ENCOUNTER — Telehealth: Payer: Self-pay | Admitting: *Deleted

## 2014-05-23 ENCOUNTER — Inpatient Hospital Stay: Payer: Medicare Other | Admitting: Pulmonary Disease

## 2014-05-23 ENCOUNTER — Ambulatory Visit (INDEPENDENT_AMBULATORY_CARE_PROVIDER_SITE_OTHER)
Admission: RE | Admit: 2014-05-23 | Discharge: 2014-05-23 | Disposition: A | Discharge status: Home / Self Care | Payer: Medicare Other | Source: Ambulatory Visit | Attending: Pulmonary Disease | Admitting: Pulmonary Disease

## 2014-05-23 ENCOUNTER — Other Ambulatory Visit: Payer: Self-pay | Admitting: Internal Medicine

## 2014-05-23 DIAGNOSIS — J189 Pneumonia, unspecified organism: Secondary | ICD-10-CM

## 2014-05-23 MED ORDER — OMEPRAZOLE 40 MG PO CPDR: 40.0000 mg | DELAYED_RELEASE_CAPSULE | Freq: Two times a day (BID) | ORAL | Status: AC

## 2014-05-23 NOTE — Telephone Encounter (Signed)
Pt drop off note stating he is out of his omeprazole. Having a hard time with [facility] send his medications. W[facility] to get [facility]. Inform pt will send...[name]

## 2014-05-24 DIAGNOSIS — R262 Difficulty in walking, not elsewhere classified: Secondary | ICD-10-CM

## 2014-05-24 DIAGNOSIS — R279 Unspecified lack of coordination: Secondary | ICD-10-CM

## 2014-05-24 DIAGNOSIS — E119 Type 2 diabetes mellitus without complications: Secondary | ICD-10-CM

## 2014-05-30 ENCOUNTER — Encounter: Payer: Self-pay | Admitting: Adult Health

## 2014-05-30 ENCOUNTER — Ambulatory Visit (INDEPENDENT_AMBULATORY_CARE_PROVIDER_SITE_OTHER): Payer: Medicare Other | Admitting: Adult Health

## 2014-05-30 ENCOUNTER — Other Ambulatory Visit: Payer: Self-pay | Admitting: *Deleted

## 2014-05-30 VITALS — BP 120/76 | HR 79 | Temp 97.8°F | Ht 69.0 in | Wt 147.2 lb

## 2014-05-30 DIAGNOSIS — J690 Pneumonitis due to inhalation of food and vomit: Secondary | ICD-10-CM

## 2014-05-30 DIAGNOSIS — J449 Chronic obstructive pulmonary disease, unspecified: Secondary | ICD-10-CM

## 2014-05-30 MED ORDER — GABAPENTIN 100 MG PO CAPS: 100.0000 mg | ORAL_CAPSULE | Freq: Two times a day (BID) | ORAL | Status: AC

## 2014-05-30 NOTE — Progress Notes (Signed)
Subjective:    Patient ID: [name], male    DOB: [birth year], 73 y.o.   MRN: [id number]  HPI 73 yo seen for pulmonary consult for lung mass   02/14/14 IOV  he patient is a 73 year old male who I've been asked to see for followup of a lung mass. This was first noted in may of last year during a hospitalization, and underwent a transthoracic needle aspiration. This showed acute inflammation and acellular debris, but unfortunately no culture was sent. The patient had a quantiferon gold test done that was indeterminate.  The patient was started on Levaquin at discharge for a possible lung abscess, and unfortunately he never followed up with scheduled visits. He comes in today where he is continuing to smoke. He denies any significant cough, mucus, or chest congestion. He tells me that he has a poor appetite but has not had significant weight loss. He denies any night sweats or chills, and has had no chest discomfort. He denies any history of tuberculosis exposure.   05/30/2014 [facility] follow up  Patient returns for a post hospital followup. Patient was admitted June 28 through July 2 for a right sided pneumonia, secondary to suspected aspiration. CT chest showed a large mass like consolidation of the right upper lobe, right middle lobe, and a spiculated mass in the right upper lobe. Patient had had previous workup one year ago with a similar presentation. M. Underwent a transthoracic biopsy with negative malignant cells. Patient had a recent chest x-ray in April 2015 that showed cleared. Consolidation. Patient has had fevers, decreased appetite, and weight loss. He was treated with aggressive IV antibiotics, and transitioned over to Levaquin and Flagyl. At discharge to be completed over 4 weeks. Patient was seen by infectious disease. Sputum culture grew rare Candida. Blood cultures were negative. Speech therapy evaluation showed mild aspiration risk.  Since discharge. Patient is feeling  better. He has a few days left of flagyl and has finished Levaquin .  He has not smoked in 2 weeks. Since discharge, however, is vaporing .  Patient was seen by pulmonary during hospitalization and with recommendations to have a CT chest in 3 months Patient has seen his primary care physician with followup. Lab work. He continues to have elevated white blood cell count, and LFTs. Patient denies any hemoptysis, orthopnea, PND, or increased leg swelling. Appetite is slightly improved.  Review of Systems Constitutional:   No  night sweats,  Fevers, chills, + fatigue, or  lassitude.  HEENT:   No headaches,  Difficulty swallowing,  Tooth/dental problems, or  Sore throat,                No sneezing, itching, ear ache, nasal congestion, post nasal drip,   CV:  No chest pain,  Orthopnea, PND, swelling in lower extremities, anasarca, dizziness, palpitations, syncope.   GI  No heartburn, indigestion, abdominal pain, nausea, vomiting, diarrhea, change in bowel habits, loss of appetite, bloody stools.   Resp:  .  No chest wall deformity  Skin: no rash or lesions.  GU: no dysuria, change in color of urine, no urgency or frequency.  No flank pain, no hematuria   MS:  No joint pain or swelling.  No decreased range of motion.  No back pain.  Psych:  No change in mood or affect. No depression or anxiety.  No memory loss.         Objective:   Physical Exam GEN: A/Ox3; pleasant , NAD, elderly and  frail , walks in cane   HEENT:  [place]/AT,  EACs-clear, TMs-wnl, NOSE-clear, THROAT-clear, no lesions, no postnasal drip or exudate noted.   NECK:  Supple w/ fair ROM; no JVD; normal carotid impulses w/o bruits; no thyromegaly or nodules palpated; no lymphadenopathy.  RESP  Decreased BS in bases no accessory muscle use, no dullness to percussion  CARD:  RRR, no m/r/g  , no peripheral edema, pulses intact, no cyanosis or clubbing.  GI:   Soft & nt; nml bowel sounds; no organomegaly or masses  detected.  Musco: Warm bil, no deformities or joint swelling noted.   Neuro: alert, no focal deficits noted.    Skin: Warm, no lesions or rashes  CXR  05/23/14 There has been interval improvement in the right middle lobe opacity consistent with resolving pneumonia or atelectasis. Persistent abnormality is demonstrated which may reflect a central obstructing  lesion.         Assessment & Plan:

## 2014-05-30 NOTE — Patient Instructions (Signed)
Great job on stopping smoking  Continue on [employer] Flagyl as directed.  Follow up Dr. Gwenette Greet in 3 weeks with Chest xray  Please contact office for sooner follow up if symptoms do not improve or worsen or seek emergency care

## 2014-05-30 NOTE — Assessment & Plan Note (Signed)
Recurrent Right sided PNA ? Aspiration related  CXR  shows improved aeration w/ decreased right sided consolidaiton  Will need serial follow up CXR for clearance Most likely will need CT chest in ~07/2014   Plan  Finish Flagyl as directed.  Follow up Dr. Gwenette Greet in 3 weeks with Chest xray  Please contact office for sooner follow up if symptoms do not improve or worsen or seek emergency care

## 2014-05-30 NOTE — Assessment & Plan Note (Signed)
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

## 2014-05-30 NOTE — Telephone Encounter (Signed)
Pt drop off letter stating his gabapentin has expired no longer using [facility]. [facility]. Called pt back inform him [facility].../lmb

## 2014-06-04 NOTE — Progress Notes (Signed)
Ov reviewed and agree with plan as outlined.  

## 2014-06-07 ENCOUNTER — Inpatient Hospital Stay: Payer: Medicare Other | Admitting: Pulmonary Disease

## 2014-06-22 ENCOUNTER — Ambulatory Visit (INDEPENDENT_AMBULATORY_CARE_PROVIDER_SITE_OTHER): Payer: Medicare Other | Admitting: Pulmonary Disease

## 2014-06-22 ENCOUNTER — Encounter: Payer: Self-pay | Admitting: Pulmonary Disease

## 2014-06-22 ENCOUNTER — Ambulatory Visit (INDEPENDENT_AMBULATORY_CARE_PROVIDER_SITE_OTHER)
Admission: RE | Admit: 2014-06-22 | Discharge: 2014-06-22 | Disposition: A | Discharge status: Home / Self Care | Payer: Medicare Other | Source: Ambulatory Visit | Attending: Pulmonary Disease | Admitting: Pulmonary Disease

## 2014-06-22 ENCOUNTER — Other Ambulatory Visit: Payer: Self-pay | Admitting: *Deleted

## 2014-06-22 VITALS — BP 140/86 | HR 68 | Temp 98.0°F | Ht 69.0 in | Wt 152.6 lb

## 2014-06-22 DIAGNOSIS — J189 Pneumonia, unspecified organism: Secondary | ICD-10-CM

## 2014-06-22 DIAGNOSIS — R222 Localized swelling, mass and lump, trunk: Secondary | ICD-10-CM

## 2014-06-22 DIAGNOSIS — J690 Pneumonitis due to inhalation of food and vomit: Secondary | ICD-10-CM

## 2014-06-22 DIAGNOSIS — R918 Other nonspecific abnormal finding of lung field: Secondary | ICD-10-CM

## 2014-06-22 HISTORY — DX: Pneumonia, unspecified organism: J18.9

## 2014-06-22 MED ORDER — GLIMEPIRIDE 1 MG PO TABS: 1.0000 mg | ORAL_TABLET | Freq: Two times a day (BID) | ORAL | Status: AC

## 2014-06-22 NOTE — Progress Notes (Signed)
   Subjective:    Patient ID: [name], male    DOB: [birth year], 73 y.o.   MRN: [id number]  HPI Patient comes in today for followup of his recent aspiration pneumonia. We have been following an ongoing right upper lobe process, unclear how much of this was infectious, inflammatory, or possibly an underlying malignancy. He has had serial chest x-rays that showed significant clearing of his right upper lobe process leading up to his admission in June for recurrent aspiration related to reflux. He had a CT scan in June which showed extensive pneumonia, but also a density that was adjacent to the chest wall that we have been following. He is been treated with antibiotics, and followed up with my nurse practitioner with a chest x-ray showing resolution of his prior pneumonia. Currently, he feels that he is doing very well, with no significant cough or purulent mucus. He has stopped smoking, but is using a vapor device instead.   Review of Systems  Constitutional: Negative for fever and unexpected weight change.  HENT: Negative for congestion, dental problem, ear pain, nosebleeds, postnasal drip, rhinorrhea, sinus pressure, sneezing, sore throat and trouble swallowing.   Eyes: Negative for redness and itching.  Respiratory: Negative for cough, chest tightness, shortness of breath and wheezing.   Cardiovascular: Negative for palpitations and leg swelling.  Gastrointestinal: Negative for nausea and vomiting.  Genitourinary: Negative for dysuria.  Musculoskeletal: Negative for joint swelling.  Skin: Negative for rash.  Neurological: Negative for headaches.  Hematological: Does not bruise/bleed easily.  Psychiatric/Behavioral: Negative for dysphoric mood. The patient is not nervous/anxious.        Objective:   Physical Exam Obese male in no acute distress Nose without purulence or discharge noted Neck without lymphadenopathy or thyromegaly Chest with decreased breath sounds, a few rhonchi,  but otherwise clear Cardiac exam with regular rate and rhythm Lower extremities with minimal edema, no cyanosis Alert and oriented, moves all 4 extremities       Assessment & Plan:

## 2014-06-22 NOTE — Telephone Encounter (Signed)
Pt states he is needing [facility] temazepam. No longer using [facility] but he can still get 90 supply. i have sent the glimepiride pls advise on temazepam.../lmb

## 2014-06-22 NOTE — Patient Instructions (Signed)
Your chest xray is much improved, but we need to do a followup ct chest for a spot that is being followed. [facility] schedule ct [facility], but you will need to go by [facility] hospital and have a disk made to of your most recent scan and take to [facility] for them to have a comparison.  You would also need to bring a disk of your new scan from [facility] to my office so that I can review. Continue to stay off cigarettes Will arrange followup after I see you scan from September.

## 2014-06-22 NOTE — Assessment & Plan Note (Signed)
The patient has gotten over his recent episode of aspiration pneumonia, but will need a followup scan for his density that was pleural-based on the last scan in June. This may all be scarring and inflammatory changes related to his recent pneumonia, however we cannot exclude the possibility of a malignancy. Will schedule the scan for the second week in September, and I will call him with the results. I have stressed to him again the importance of total smoking cessation.

## 2014-06-22 NOTE — Assessment & Plan Note (Signed)
The patient is having recurrent pneumonias in the right upper lobe that I suspect are related to aspiration. He has had significant clearing by a chest x-ray after being treated aggressively with antibiotics.

## 2014-06-25 ENCOUNTER — Telehealth: Payer: Self-pay | Admitting: Internal Medicine

## 2014-06-25 MED ORDER — TEMAZEPAM 15 MG PO CAPS: 15.0000 mg | ORAL_CAPSULE | Freq: Every evening | ORAL | Status: DC | PRN

## 2014-06-25 NOTE — Telephone Encounter (Signed)
error 

## 2014-06-25 NOTE — Telephone Encounter (Signed)
Patient called in requesting status on temazepam

## 2014-06-26 ENCOUNTER — Telehealth: Payer: Self-pay | Admitting: Pulmonary Disease

## 2014-06-26 ENCOUNTER — Telehealth: Payer: Self-pay | Admitting: *Deleted

## 2014-06-26 ENCOUNTER — Other Ambulatory Visit: Payer: Self-pay | Admitting: *Deleted

## 2014-06-26 ENCOUNTER — Other Ambulatory Visit: Payer: Self-pay | Admitting: Internal Medicine

## 2014-06-26 MED ORDER — TEMAZEPAM 15 MG PO CAPS: 15.0000 mg | ORAL_CAPSULE | Freq: Every evening | ORAL | Status: DC | PRN

## 2014-06-26 NOTE — Telephone Encounter (Signed)
Per Loleta Dicker[facility]- pt is filling Temazepam 1 month early. He wants MD aware last fill was 04/24/14 # 180 through [facility]. That Rx had 1 refill on it.  MD advised- he advises 1 month refill at this time. Gave verbal for Temazepam 1-2 qhs # 60 with 0 refills.  Will Call [facility] pharmacy to cancel any remaining refills on file [phone].

## 2014-06-26 NOTE — Telephone Encounter (Signed)
Called and spoke with pt and he stated that he will not get the results of his next CT until 9-16 and once he gets these he will send to [facility].  Will forward to [facility] to make him aware.  Nothing further is needed.

## 2014-06-26 NOTE — Telephone Encounter (Signed)
[facility] [address] Advised him that they have not received the RX re-fill on temazepam.

## 2014-06-26 NOTE — Telephone Encounter (Signed)
rx called into pharm.

## 2014-06-28 NOTE — Telephone Encounter (Signed)
All remaining refills cancelled at [place].

## 2014-07-03 ENCOUNTER — Other Ambulatory Visit: Payer: Self-pay | Admitting: Internal Medicine

## 2014-07-10 ENCOUNTER — Ambulatory Visit (INDEPENDENT_AMBULATORY_CARE_PROVIDER_SITE_OTHER): Payer: Medicare Other | Admitting: Internal Medicine

## 2014-07-10 ENCOUNTER — Encounter: Payer: Self-pay | Admitting: Internal Medicine

## 2014-07-10 ENCOUNTER — Other Ambulatory Visit (INDEPENDENT_AMBULATORY_CARE_PROVIDER_SITE_OTHER): Payer: Medicare Other

## 2014-07-10 VITALS — BP 128/90 | HR 76 | Temp 97.8°F | Resp 16 | Wt 156.0 lb

## 2014-07-10 DIAGNOSIS — K2270 Barrett's esophagus without dysplasia: Secondary | ICD-10-CM

## 2014-07-10 DIAGNOSIS — E1159 Type 2 diabetes mellitus with other circulatory complications: Secondary | ICD-10-CM

## 2014-07-10 DIAGNOSIS — G4700 Insomnia, unspecified: Secondary | ICD-10-CM

## 2014-07-10 DIAGNOSIS — I798 Other disorders of arteries, arterioles and capillaries in diseases classified elsewhere: Secondary | ICD-10-CM

## 2014-07-10 DIAGNOSIS — F411 Generalized anxiety disorder: Secondary | ICD-10-CM

## 2014-07-10 DIAGNOSIS — K22711 Barrett's esophagus with high grade dysplasia: Secondary | ICD-10-CM

## 2014-07-10 DIAGNOSIS — I2510 Atherosclerotic heart disease of native coronary artery without angina pectoris: Secondary | ICD-10-CM

## 2014-07-10 DIAGNOSIS — Z23 Encounter for immunization: Secondary | ICD-10-CM

## 2014-07-10 DIAGNOSIS — E1151 Type 2 diabetes mellitus with diabetic peripheral angiopathy without gangrene: Secondary | ICD-10-CM

## 2014-07-10 DIAGNOSIS — F329 Major depressive disorder, single episode, unspecified: Secondary | ICD-10-CM

## 2014-07-10 DIAGNOSIS — R634 Abnormal weight loss: Secondary | ICD-10-CM

## 2014-07-10 DIAGNOSIS — F17200 Nicotine dependence, unspecified, uncomplicated: Secondary | ICD-10-CM

## 2014-07-10 DIAGNOSIS — R296 Repeated falls: Secondary | ICD-10-CM

## 2014-07-10 DIAGNOSIS — Z9181 History of falling: Secondary | ICD-10-CM

## 2014-07-10 DIAGNOSIS — F3289 Other specified depressive episodes: Secondary | ICD-10-CM

## 2014-07-10 LAB — BASIC METABOLIC PANEL
BUN: 27 mg/dL — ABNORMAL HIGH (ref 6–23)
CHLORIDE: 109 meq/L (ref 96–112)
CO2: 23 meq/L (ref 19–32)
CREATININE: 1.5 mg/dL (ref 0.4–1.5)
Calcium: 9 mg/dL (ref 8.4–10.5)
GFR: 47.64 mL/min — ABNORMAL LOW (ref >60.00)
Glucose, Bld: 98 mg/dL (ref 70–99)
POTASSIUM: 4 meq/L (ref 3.5–5.1)
SODIUM: 140 meq/L (ref 135–145)

## 2014-07-10 LAB — HEMOGLOBIN A1C: Hgb A1c MFr Bld: 5.8 % (ref 4.6–6.5)

## 2014-07-10 MED ORDER — ALPRAZOLAM 0.5 MG PO TABS: ORAL_TABLET | ORAL | Status: DC

## 2014-07-10 MED ORDER — LABETALOL HCL 200 MG PO TABS: 200.0000 mg | ORAL_TABLET | Freq: Three times a day (TID) | ORAL | Status: DC

## 2014-07-10 MED ORDER — TEMAZEPAM 15 MG PO CAPS: 15.0000 mg | ORAL_CAPSULE | Freq: Every evening | ORAL | Status: DC | PRN

## 2014-07-10 NOTE — Assessment & Plan Note (Signed)
Wt Readings from Last 3 Encounters:  07/10/14 156 lb (70.761 kg)  06/22/14 152 lb 9.6 oz (69.219 kg)  05/30/14 147 lb 3.2 oz (66.769 kg)

## 2014-07-10 NOTE — Assessment & Plan Note (Signed)
Resolved

## 2014-07-10 NOTE — Assessment & Plan Note (Signed)
Chronic - on Temazepam  Potential benefits of a long term benzodiazepines  use as well as potential risks  and complications were explained to the patient and were aknowledged.

## 2014-07-10 NOTE — Progress Notes (Signed)
Pre visit review using our clinic review tool, if applicable. No additional management support is needed unless otherwise documented below in the visit note. 

## 2014-07-10 NOTE — Assessment & Plan Note (Signed)
Continue with current prescription therapy as reflected on the Med list.  

## 2014-07-10 NOTE — Assessment & Plan Note (Signed)
Chronic   Potential benefits of a long term benzodiazepines  use as well as potential risks  and complications were explained to the patient and were aknowledged. Not using Temazepam w/Alprazolam together

## 2014-07-10 NOTE — Assessment & Plan Note (Signed)
Refractory Quit on 05/09/14 - "vaping" now: feeling better

## 2014-07-10 NOTE — Progress Notes (Signed)
   Subjective:   F/u wt loss, no appetite - better    HPI   F/u:  Renal failure - CRF COPD  DM (diabetes mellitus), type 2 with peripheral vascular complications  Normocytic anemia  Tremors of nervous system  Iron deficiency anemia  Skin Ca The patient also presents for a follow-up of  chronic hypertension, chronic dyslipidemia, type 2 diabetes, COPD, Barrett's, CAD controlled with medicines. He had Barrett's ablation x4, one more is due.     Review of Systems  Constitutional: Positive for unexpected weight change. Negative for appetite change and fatigue.  HENT: Negative for congestion, nosebleeds, sneezing and trouble swallowing.   Eyes: Negative for itching and visual disturbance.  Cardiovascular: Negative for palpitations and leg swelling.  Gastrointestinal: Positive for diarrhea. Negative for nausea, vomiting, constipation, blood in stool, abdominal distention and rectal pain.  Genitourinary: Negative for frequency and hematuria.  Musculoskeletal: Negative for back pain, gait problem, joint swelling and neck pain.  Neurological: Negative for dizziness, tremors, speech difficulty and weakness.  Psychiatric/Behavioral: Negative for sleep disturbance, dysphoric mood and agitation. The patient is not nervous/anxious.    Wt Readings from Last 3 Encounters:  07/10/14 156 lb (70.761 kg)  06/22/14 152 lb 9.6 oz (69.219 kg)  05/30/14 147 lb 3.2 oz (66.769 kg)   BP Readings from Last 3 Encounters:  07/10/14 128/90  06/22/14 140/86  05/30/14 120/76        Objective:   Physical Exam  Constitutional: He is oriented to person, place, and time. He appears well-developed.  HENT:  Head: Normocephalic and atraumatic.  Mouth/Throat: No oropharyngeal exudate.  Eyes: Conjunctivae are normal. Pupils are equal, round, and reactive to light.  Neck: Normal range of motion. No JVD present. No thyromegaly present.  Cardiovascular: Normal rate, regular rhythm, normal heart sounds and  intact distal pulses.  Exam reveals no gallop and no friction rub.   No murmur heard. Pulmonary/Chest: Effort normal and breath sounds normal. No respiratory distress. He has no wheezes. He has no rales. He exhibits no tenderness.  Abdominal: Soft. Bowel sounds are normal. He exhibits no distension and no mass. There is no tenderness. There is no rebound and no guarding.  Musculoskeletal: Normal range of motion. He exhibits no edema and no tenderness.  Lymphadenopathy:    He has no cervical adenopathy.  Neurological: He is alert and oriented to person, place, and time. He has normal reflexes. No cranial nerve deficit. He exhibits normal muscle tone. Coordination normal.  Skin: Skin is warm and dry. No rash noted.  Psychiatric: He has a normal mood and affect. His behavior is normal. Judgment and thought content normal.  AKs Not dyspneic    Lab Results  Component Value Date   WBC 17.4* 05/09/2014   HGB 9.1* 05/09/2014   HCT 28.0* 05/09/2014   PLT 457.0* 05/09/2014   GLUCOSE 63* 05/09/2014   CHOL 96 05/09/2014   TRIG 183.0* 05/09/2014   HDL 9.80* 05/09/2014   LDLDIRECT 80.2 08/11/2012   LDLCALC 50 05/09/2014   ALT 187* 05/03/2014   AST 144* 05/03/2014   NA 140 05/09/2014   K 4.0 05/09/2014   CL 108 05/09/2014   CREATININE 2.5* 05/09/2014   BUN 42* 05/09/2014   CO2 20 05/09/2014   TSH 2.00 02/06/2014   PSA 0.99 04/17/2010   INR 1.27 03/15/2013   HGBA1C 6.2 02/06/2014              Assessment & Plan:

## 2014-07-12 ENCOUNTER — Telehealth: Payer: Self-pay | Admitting: *Deleted

## 2014-07-12 DIAGNOSIS — H9390 Unspecified disorder of ear, unspecified ear: Secondary | ICD-10-CM

## 2014-07-12 NOTE — Telephone Encounter (Signed)
Left msg on triage stating md told him he need to see ENT- Dr. Claria Dice. He call them and they told him he will need an referral.../lmb

## 2014-07-12 NOTE — Telephone Encounter (Signed)
Reason? Thx

## 2014-07-13 NOTE — Telephone Encounter (Signed)
Called pt he states it is for the knot below his ear. He already made the appt with Dr. Claria Dice for Monday they are just needing the referral.../lmb

## 2014-07-13 NOTE — Telephone Encounter (Signed)
Entered referral for ENT...[name]

## 2014-07-17 ENCOUNTER — Other Ambulatory Visit: Payer: Self-pay | Admitting: Internal Medicine

## 2014-07-17 ENCOUNTER — Telehealth: Payer: Self-pay

## 2014-07-17 DIAGNOSIS — I25759 Atherosclerosis of native coronary artery of transplanted heart with unspecified angina pectoris: Secondary | ICD-10-CM

## 2014-07-17 NOTE — Telephone Encounter (Signed)
Received a call from Dr. Clotilde Dieter in regards to pts labs.  He wanted to speak with you himself.

## 2014-07-17 NOTE — Telephone Encounter (Signed)
I called back and left a message w/Gail Thx

## 2014-07-18 ENCOUNTER — Telehealth: Payer: Self-pay | Admitting: *Deleted

## 2014-07-18 ENCOUNTER — Ambulatory Visit: Payer: Medicare Other

## 2014-07-18 VITALS — BP 118/82

## 2014-07-18 DIAGNOSIS — Z0130 Encounter for examination of blood pressure without abnormal findings: Secondary | ICD-10-CM

## 2014-07-18 NOTE — Telephone Encounter (Signed)
Finding 20x12 mm mass PET scan recommended  Disk is in [facility] green folder as Dr. Lajoyce Corners not able to compare previous scan since last CT done in our system 2 years ago.

## 2014-07-19 ENCOUNTER — Other Ambulatory Visit (HOSPITAL_COMMUNITY): Payer: Self-pay | Admitting: Otolaryngology

## 2014-07-19 DIAGNOSIS — R9389 Abnormal findings on diagnostic imaging of other specified body structures: Secondary | ICD-10-CM

## 2014-07-20 NOTE — Telephone Encounter (Signed)
Pt is calling back [phone].  [name]

## 2014-07-20 NOTE — Telephone Encounter (Signed)
I have not called pt. Please advise [place] if you did? thanks

## 2014-07-20 NOTE — Telephone Encounter (Signed)
Pt is returning [name]'s call & can be reached at [phone].  [name]

## 2014-07-20 NOTE — Telephone Encounter (Signed)
[name], I have called pt and discussed with him.  Told him it looks the same to me, but will show to radiologist at [facility] once I get back in town.  Please [facility] to [facility] and have them fax over FORMAL report on the ct chest. Thanks.

## 2014-07-20 NOTE — Telephone Encounter (Signed)
Spoke with the pt  He is asking if [facility] ever had a chance to review his disc  Please advise, thanks

## 2014-07-21 ENCOUNTER — Other Ambulatory Visit: Payer: Self-pay | Admitting: Internal Medicine

## 2014-07-23 NOTE — Telephone Encounter (Signed)
atc [phone] Line rang several times wcb

## 2014-07-23 NOTE — Telephone Encounter (Signed)
Called for report. Will await fax

## 2014-07-23 NOTE — Telephone Encounter (Signed)
Results received and placed in [facility] green folder. thanks

## 2014-07-24 ENCOUNTER — Inpatient Hospital Stay
Admission: RE | Admit: 2014-07-24 | Discharge: 2014-07-24 | Disposition: A | Discharge status: Home / Self Care | Payer: Self-pay | Source: Ambulatory Visit | Attending: Otolaryngology | Admitting: Otolaryngology

## 2014-07-24 ENCOUNTER — Other Ambulatory Visit (HOSPITAL_COMMUNITY): Payer: Self-pay | Admitting: Otolaryngology

## 2014-07-24 DIAGNOSIS — R52 Pain, unspecified: Secondary | ICD-10-CM

## 2014-07-25 ENCOUNTER — Telehealth: Payer: Self-pay | Admitting: *Deleted

## 2014-07-25 ENCOUNTER — Ambulatory Visit (INDEPENDENT_AMBULATORY_CARE_PROVIDER_SITE_OTHER): Payer: Medicare Other | Admitting: Physician Assistant

## 2014-07-25 ENCOUNTER — Encounter: Payer: Self-pay | Admitting: Physician Assistant

## 2014-07-25 VITALS — BP 132/84 | HR 73 | Ht 69.0 in | Wt 159.0 lb

## 2014-07-25 DIAGNOSIS — F17200 Nicotine dependence, unspecified, uncomplicated: Secondary | ICD-10-CM

## 2014-07-25 DIAGNOSIS — I10 Essential (primary) hypertension: Secondary | ICD-10-CM

## 2014-07-25 DIAGNOSIS — Z01810 Encounter for preprocedural cardiovascular examination: Secondary | ICD-10-CM

## 2014-07-25 DIAGNOSIS — I714 Abdominal aortic aneurysm, without rupture, unspecified: Secondary | ICD-10-CM

## 2014-07-25 DIAGNOSIS — I2510 Atherosclerotic heart disease of native coronary artery without angina pectoris: Secondary | ICD-10-CM

## 2014-07-25 DIAGNOSIS — Z720 Tobacco use: Secondary | ICD-10-CM

## 2014-07-25 DIAGNOSIS — R222 Localized swelling, mass and lump, trunk: Secondary | ICD-10-CM

## 2014-07-25 DIAGNOSIS — N189 Chronic kidney disease, unspecified: Secondary | ICD-10-CM

## 2014-07-25 DIAGNOSIS — R918 Other nonspecific abnormal finding of lung field: Secondary | ICD-10-CM

## 2014-07-25 DIAGNOSIS — E785 Hyperlipidemia, unspecified: Secondary | ICD-10-CM

## 2014-07-25 NOTE — Telephone Encounter (Signed)
Pt is due for his temazepam tomorrow, but he is going out of town today. Wanting to see if it a ok to go ahead and refill. Inform christy ok to fill early  this time only...[name]

## 2014-07-25 NOTE — Progress Notes (Signed)
Cardiology Office Note    Date:  07/25/2014   ID:  Johnny, [name] [birth year], MRN [id number]  PCP:  Walker Kehr, MD  Cardiologist:  Dr. Lauree Chandler   [facility]:  Dr. Scot Dock   History of Present Illness: [name] is a 73 y.o. male with a history of CAD, status post remote stenting to the RCA, HTN, HL, diabetes, CKD, AAA status post repair, tobacco abuse, GERD, benign lung mass and Barrett's esophagus. He was previously followed by Dr. Olevia Perches. Last catheterization in 2001 demonstrated patent stents and nonobstructive disease elsewhere. CKD is followed by Dr. Justin Mend. He has been intolerant to multiple blood pressure medications. AAA repair was in 11/2008 by Dr. Amedeo Plenty. Last Myoview in 2013 demonstrated no ischemia. Patient had admission for lung mass in 2014. This was found to be benign. Last seen by Dr. Lauree Chandler 09/2013.  Patient was admitted 05/2014 with suspected aspiration pneumonia in the setting of weakness and failure to thrive.  Of note, statin therapy was held in the setting of transaminitis.  He has been followed by Dr. Gwenette Greet and for aspiration pneumonia and R lung mass.  He has a left neck mass that has been evaluated by Dr. Ernesto Rutherford. He needs a needle biopsy. He is here for surgical clearance. Patient denies chest pain. He denies significant dyspnea with exertion. He is NYHA 2-2b.  He denies orthopnea, PND or edema. He denies syncope. He can achieve approximately 4 METs without symptoms consistent with angina.   Studies:  - LHC (6/01):  prox RCA stent patent, mid RCA 30%, prox and mid LAD 40%, dist sub-branch of CFX 40%, small dist AAA >> Med Rx  - Nuclear (11/13):  Normal stress nuclear study.  LV Ejection Fraction: 56%  - Carotid US (5/14):  R < 39%, L 40-59%  - Abdominal US (12/13):  Aorta and endograft patent - no leak   Recent Labs/Images:  02/06/2014: TSH 2.00  05/03/2014: ALT 187*; AST 144*  05/09/2014: HDL Cholesterol by NMR 9.80*; Hemoglobin 9.1*;  LDL (calc) 50; WBC 17.4*  07/10/2014: BUN 27*; Creatinine 1.5; Potassium 4.0; Sodium 140     Wt Readings from Last 3 Encounters:  07/25/14 159 lb (72.122 kg)  07/10/14 156 lb (70.761 kg)  06/22/14 152 lb 9.6 oz (69.219 kg)     Past Medical History  Diagnosis Date  . Diabetes mellitus   . Hypertension   . Anxiety   . COPD (chronic obstructive pulmonary disease)   . Hyperlipemia   . GERD (gastroesophageal reflux disease)   . Barrett's esophagus with high grade dysplasia     s/p RFA  . PVD (peripheral vascular disease)   . CAD (coronary artery disease)   . Tobacco user   . Depression   . Hiatal hernia   . Myocardial infarction 12/1996  . AAA (abdominal aortic aneurysm)   . Renal insufficiency 2010    Current Outpatient Prescriptions  Medication Sig Dispense Refill  . ALPRAZolam (XANAX) 0.5 MG tablet TAKE 1 TABLET BY MOUTH TWICE A DAY AS NEEDED  60 tablet  3  . aspirin EC 81 MG tablet Take 81 mg by mouth every morning.       . calcium-vitamin D (OSCAL WITH D) 500-200 MG-UNIT per tablet Take 1 tablet by mouth 3 (three) times daily.      . cholecalciferol (VITAMIN D) 1000 UNITS tablet Take 4,000 Units by mouth every morning.      . cloNIDine (CATAPRES) 0.1 MG tablet Take 0.1  mg by mouth 2 (two) times daily.      . feeding supplement (RESOURCE BREEZE) LIQD Take 1 Container by mouth 4 (four) times daily -  with meals and at bedtime.  1 Container  0  . FLUoxetine (PROZAC) 10 MG capsule Take 10 mg by mouth every morning.      . gabapentin (NEURONTIN) 100 MG capsule Take 1 capsule (100 mg total) by mouth 2 (two) times daily.  180 capsule  3  . glimepiride (AMARYL) 1 MG tablet Take 1 tablet (1 mg total) by mouth 2 (two) times daily.  180 tablet  3  . iron polysaccharides (NIFEREX) 150 MG capsule Take 1 capsule (150 mg total) by mouth daily.  30 capsule  2  . labetalol (NORMODYNE) 200 MG tablet Take 1 tablet (200 mg total) by mouth 3 (three) times daily.  90 tablet  11  . Magnesium 300  MG CAPS 1 po qd  30 capsule  2  . mirtazapine (REMERON) 15 MG tablet Use 1 tab at 3-4 pm  30 tablet  5  . Multiple Vitamin (MULTIVITAMIN WITH MINERALS) TABS tablet Take 1 tablet by mouth every morning.      Marland Kitchen omeprazole (PRILOSEC) 40 MG capsule Take 1 capsule (40 mg total) by mouth 2 (two) times daily.  180 capsule  3  . promethazine (PHENERGAN) 12.5 MG tablet Take 12.5 mg by mouth every 6 (six) hours as needed for nausea or vomiting.      . sodium bicarbonate 650 MG tablet Take 1,300 mg by mouth every morning.       . temazepam (RESTORIL) 15 MG capsule Take 1-2 capsules (15-30 mg total) by mouth at bedtime as needed for sleep.  60 capsule  3  . Umeclidinium-Vilanterol (ANORO ELLIPTA) 62.5-25 MCG/INH AEPB Inhale 1 puff into the lungs every morning.       No current facility-administered medications for this visit.     Allergies:   Amlodipine besylate; Benazepril hcl; Risperidone; Tramadol; Valsartan; and Penicillins   Social History:  The patient  reports that he quit smoking about 2 months ago. His smoking use included Cigarettes. He has a 54 pack-year smoking history. He has never used smokeless tobacco. He reports that he does not drink alcohol or use illicit drugs.   Family History:  The patient's family history includes Asthma in his other; Cancer in his father and mother; Coronary artery disease in his other; Diabetes in his father, mother, and other; Heart disease in his father; Mental illness in his mother; Stomach cancer in his father. There is no history of Colon cancer, Esophageal cancer, Heart attack, or Stroke.   ROS:  Please see the history of present illness.      All other systems reviewed and negative.    PHYSICAL EXAM: VS:  BP 132/84  Pulse 73  Ht 5\' 9"  (1.753 m)  Wt 159 lb (72.122 kg)  BMI 23.47 kg/m2 Well nourished, well developed, in no acute distress HEENT: normal Neck: no JVDlarge (~ 4-5 cm) hard non-mobile mass tender to palpation Cardiac:  normal S1, S2; RRR;  no murmur Lungs:  clear to auscultation bilaterally, no wheezing, rhonchi or rales Abd: soft, nontender, no hepatomegaly Ext: no edema Skin: warm and dry Neuro:  CNs 2-12 intact, no focal abnormalities noted  EKG:  NSR, HR 73, normal axis, nonspecific ST-T wave changes, no change when compared to prior tracings      ASSESSMENT AND PLAN:  1. Pre-operative cardiovascular examination:  The patient  does not have any unstable cardiac conditions.  He can achieve  Approximately 4 METs without symptoms to suggest angina. His ECG is stable. He had a low risk Myoview 2 years ago. He does not require any further cardiac evaluation prior to his noncardiac surgery. I reviewed this with Dr. Casandra Doffing (DOD) today who agreed. He should remain on beta blocker throughout the perioperative period to reduce the risk of cardiovascular complications. He should remain on aspirin if possible. 2. CAD:  Continue aspirin, beta blocker. He is currently off of statin therapy due to his significantly elevated LFTs. We can reassess his candidacy for statin therapy followup. 3. Hypertension:  Controlled. 4. Hyperlipidemia:  Remain off of statin for now due to significantly elevated LFTs. Reconsider statin therapy at followup. 5. Tobacco abuse:  He has quit smoking 6. Right Lung mass:  Follow up with pulmonology. 7. AAA:    Follow up with [facility]. 8. CKD (chronic kidney disease):  Recent creatinine stable.    Disposition:   FU with Dr. Angelena Form in 4 months.   Signed, Versie Starks, MHS 07/25/2014 12:42 PM    [facility] [address], [place], [place]  [address] Phone: [phone]; Fax: [phone]

## 2014-07-25 NOTE — Patient Instructions (Signed)
Your physician wants you to follow-up in:  4 months with Dr. Angelena Form. You will receive a reminder letter in the mail two months in advance. If you don't receive a letter, please call our office to schedule the follow-up appointment.

## 2014-07-26 ENCOUNTER — Ambulatory Visit: Payer: Medicare Other | Admitting: Physician Assistant

## 2014-07-30 ENCOUNTER — Other Ambulatory Visit: Payer: Self-pay | Admitting: Radiology

## 2014-07-30 NOTE — Telephone Encounter (Signed)
Pt is requesting results & can be reached at [phone].  [name]

## 2014-07-30 NOTE — Telephone Encounter (Signed)
I called made pt aware [place] not back until tomorrow.  Please advise [place] thanks

## 2014-07-31 ENCOUNTER — Encounter (HOSPITAL_COMMUNITY): Payer: Self-pay | Admitting: Pharmacy Technician

## 2014-08-01 ENCOUNTER — Other Ambulatory Visit: Payer: Self-pay | Admitting: Pulmonary Disease

## 2014-08-01 ENCOUNTER — Other Ambulatory Visit (HOSPITAL_COMMUNITY): Payer: Self-pay | Admitting: Otolaryngology

## 2014-08-01 ENCOUNTER — Ambulatory Visit (HOSPITAL_COMMUNITY)
Admission: RE | Admit: 2014-08-01 | Discharge: 2014-08-01 | Disposition: A | Discharge status: Home / Self Care | Payer: Medicare Other | Source: Ambulatory Visit | Attending: Otolaryngology | Admitting: Otolaryngology

## 2014-08-01 ENCOUNTER — Encounter (HOSPITAL_COMMUNITY): Payer: Self-pay

## 2014-08-01 DIAGNOSIS — R918 Other nonspecific abnormal finding of lung field: Secondary | ICD-10-CM

## 2014-08-01 DIAGNOSIS — R9389 Abnormal findings on diagnostic imaging of other specified body structures: Secondary | ICD-10-CM

## 2014-08-01 DIAGNOSIS — K119 Disease of salivary gland, unspecified: Secondary | ICD-10-CM | POA: Diagnosis not present

## 2014-08-01 LAB — PROTIME-INR
INR: 0.99 (ref 0.00–1.49)
PROTHROMBIN TIME: 13.1 s (ref 11.6–15.2)

## 2014-08-01 LAB — CBC
HCT: 32.5 % — ABNORMAL LOW (ref 39.0–52.0)
Hemoglobin: 10.5 g/dL — ABNORMAL LOW (ref 13.0–17.0)
MCH: 31.6 pg (ref 26.0–34.0)
MCHC: 32.3 g/dL (ref 30.0–36.0)
MCV: 97.9 fL (ref 78.0–100.0)
Platelets: 274 10*3/uL (ref 150–400)
RBC: 3.32 MIL/uL — ABNORMAL LOW (ref 4.22–5.81)
RDW: 15.4 % (ref 11.5–15.5)
WBC: 9.9 10*3/uL (ref 4.0–10.5)

## 2014-08-01 LAB — APTT: APTT: 34 s (ref 24–37)

## 2014-08-01 LAB — GLUCOSE, CAPILLARY: Glucose-Capillary: 104 mg/dL — ABNORMAL HIGH (ref 70–99)

## 2014-08-01 MED ORDER — LIDOCAINE HCL (PF) 1 % IJ SOLN
INTRAMUSCULAR | Status: AC
  Filled 2014-08-01: qty 10

## 2014-08-01 MED ORDER — SODIUM CHLORIDE 0.9 % IV SOLN: Freq: Once | INTRAVENOUS | Status: DC

## 2014-08-01 MED ORDER — GELATIN ABSORBABLE 12-7 MM EX MISC
CUTANEOUS | Status: AC
  Filled 2014-08-01: qty 1

## 2014-08-01 MED ORDER — FENTANYL CITRATE 0.05 MG/ML IJ SOLN
INTRAMUSCULAR | Status: AC
  Filled 2014-08-01: qty 4

## 2014-08-01 MED ORDER — MIDAZOLAM HCL 2 MG/2ML IJ SOLN
INTRAMUSCULAR | Status: AC | PRN
  Administered 2014-08-01 (×2): 0.5 mg via INTRAVENOUS

## 2014-08-01 MED ORDER — FENTANYL CITRATE 0.05 MG/ML IJ SOLN
INTRAMUSCULAR | Status: AC | PRN
  Administered 2014-08-01 (×2): 25 ug via INTRAVENOUS

## 2014-08-01 MED ORDER — MIDAZOLAM HCL 2 MG/2ML IJ SOLN
INTRAMUSCULAR | Status: AC
  Filled 2014-08-01: qty 4

## 2014-08-01 NOTE — Sedation Documentation (Signed)
Bandaid dry and intact to left face. No change in skin integrity or bruising noted to site.

## 2014-08-01 NOTE — Telephone Encounter (Signed)
Pt called back. Aware of recs. He would like to have PET scan done. Please advise [name] thanks

## 2014-08-01 NOTE — Telephone Encounter (Signed)
lmtcb x1 

## 2014-08-01 NOTE — Telephone Encounter (Signed)
Order sent for PET scan

## 2014-08-01 NOTE — Sedation Documentation (Signed)
Changed Bp cuff size to smaller d/t small size of pt arm to see if difference in Bp.  Bp unchanged w/ new size cuff. Pt stated he did take his Bp meds this am.

## 2014-08-01 NOTE — H&P (Signed)
Chief Complaint: "I'm here for a biopsy" Referring Physician:Crossley HPI: [name] is an 73 y.o. male with an enlarged left parotid mass seen on exam and imaged by CT. He is scheduled for biopsy of this lesion today. PMHx, meds, imaging reviewed. Has been NPO this am.  Past Medical History:  Past Medical History  Diagnosis Date  . Diabetes mellitus   . Hypertension   . Anxiety   . COPD (chronic obstructive pulmonary disease)   . Hyperlipemia   . GERD (gastroesophageal reflux disease)   . Barrett's esophagus with high grade dysplasia     s/p RFA  . PVD (peripheral vascular disease)   . CAD (coronary artery disease)   . Tobacco user   . Depression   . Hiatal hernia   . Myocardial infarction 12/1996  . AAA (abdominal aortic aneurysm)   . Renal insufficiency 2010    Past Surgical History:  Past Surgical History  Procedure Laterality Date  . Tonsillectomy    . Nissen fundoplication    . Abdominal aortic aneurysm repair  2010    and right common iliac artery aneurysm, DR HAYES  . Upper gastrointestinal endoscopy  07/07/2011    barretts esophagus, hiatal hernia, gastritis, ablations in esophagus  . Colonoscopy  05/07/2005    diverticulosis, internal and external hemorrhoids  . Abdominal aortic aneurysm repair    . Radiofrequency ablation  multiple    Barrett's, high-grade dysplasia  . Colonoscopy N/A 03/01/2013    Procedure: COLONOSCOPY;  Surgeon: Gatha Mayer, MD;  Location: [facility];  Service: Endoscopy;  Laterality: N/A;    Family History:  Family History  Problem Relation Age of Onset  . Coronary artery disease Other   . Diabetes Other   . Asthma Other   . Mental illness Mother     dementia  . Cancer Mother   . Diabetes Mother   . Heart disease Father   . Diabetes Father   . Stomach cancer Father   . Cancer Father   . Colon cancer Neg Hx   . Esophageal cancer Neg Hx   . Heart attack Neg Hx   . Stroke Neg Hx     Social History:  reports that he  quit smoking about 2 months ago. His smoking use included Cigarettes. He has a 54 pack-year smoking history. He has never used smokeless tobacco. He reports that he does not drink alcohol or use illicit drugs.  Allergies:  Allergies  Allergen Reactions  . Amlodipine Besylate Swelling  . Benazepril Hcl Other (See Comments)    Elevates potassium levels  . Risperidone Other (See Comments)    loss of motor skills  . Tramadol Nausea And Vomiting  . Valsartan Other (See Comments)    Elevates potassium levels  . Penicillins Itching and Rash    Medications:   Medication List    ASK your doctor about these medications       ALPRAZolam 0.5 MG tablet  Commonly known as:  XANAX  Take 0.5 mg by mouth 2 (two) times daily as needed for anxiety.     aspirin EC 81 MG tablet  Take 81 mg by mouth every morning.     calcium-vitamin D 500-200 MG-UNIT per tablet  Commonly known as:  OSCAL WITH D  Take 1 tablet by mouth 3 (three) times daily.     cholecalciferol 1000 UNITS tablet  Commonly known as:  VITAMIN D  Take 4,000 Units by mouth every morning.     cloNIDine  0.1 MG tablet  Commonly known as:  CATAPRES  Take 0.1 mg by mouth 2 (two) times daily.     FLUoxetine 10 MG tablet  Commonly known as:  PROZAC  Take 10 mg by mouth every morning.     gabapentin 100 MG capsule  Commonly known as:  NEURONTIN  Take 1 capsule (100 mg total) by mouth 2 (two) times daily.     glimepiride 1 MG tablet  Commonly known as:  AMARYL  Take 1 tablet (1 mg total) by mouth 2 (two) times daily.     iron polysaccharides 150 MG capsule  Commonly known as:  NIFEREX  Take 1 capsule (150 mg total) by mouth daily.     labetalol 200 MG tablet  Commonly known as:  NORMODYNE  Take 1 tablet (200 mg total) by mouth 3 (three) times daily.     Magnesium 300 MG Caps  Take 300 mg by mouth daily.     mirtazapine 15 MG tablet  Commonly known as:  REMERON  Take 15-30 mg by mouth at bedtime as needed (for sleep).      multivitamin with minerals Tabs tablet  Take 1 tablet by mouth every morning.     omeprazole 40 MG capsule  Commonly known as:  PRILOSEC  Take 1 capsule (40 mg total) by mouth 2 (two) times daily.     sodium bicarbonate 650 MG tablet  Take 1,300 mg by mouth every morning.     temazepam 15 MG capsule  Commonly known as:  RESTORIL  Take 1-2 capsules (15-30 mg total) by mouth at bedtime as needed for sleep.        Please HPI for pertinent positives, otherwise complete 10 system ROS negative.  Physical Exam: BP 161/91  Pulse 73  Temp(Src) 97.6 F (36.4 C) (Oral)  Resp 20  Ht 5\' 9"  (1.753 m)  Wt 160 lb (72.576 kg)  BMI 23.62 kg/m2  SpO2 100% Body mass index is 23.62 kg/(m^2).   General Appearance:  Alert, cooperative, no distress, appears stated age  Head:  Normocephalic, without obvious abnormality, atraumatic  ENT: Unremarkable  Neck: Supple, symmetrical, trachea midline  Lungs:   Clear to auscultation bilaterally, no w/r/r, respirations unlabored without use of accessory muscles.  Chest Wall:  No tenderness or deformity  Heart:  Regular rate and rhythm, S1, S2 normal, no murmur, rub or gallop.  Neurologic: Normal affect, no gross deficits.  Labs: Results for orders placed during the hospital encounter of 08/01/14 (from the past 48 hour(s))  GLUCOSE, CAPILLARY     Status: Abnormal   Collection Time    08/01/14  7:43 AM      Result Value Ref Range   Glucose-Capillary 104 (*) 70 - 99 mg/dL    Imaging: No results found.  Assessment/Plan (L)parotid mass For US guided biopsy Explained procedure, risks, complications, use of sedation. Labs pending Consent signed in chart  Ascencion Dike PA-C 08/01/2014, 8:15 AM

## 2014-08-01 NOTE — Sedation Documentation (Signed)
Patient is resting comfortably.Transferred to radiology nurses station.

## 2014-08-01 NOTE — Sedation Documentation (Signed)
Verbal instructions for moderate sedation and wound care signs and symptoms of infection reviewed with

## 2014-08-01 NOTE — Sedation Documentation (Signed)
Pt stated he was having pain to biopsy site stated level 7/10, pt drowsy eyes closing while speaking.  Ice pack applied to site, HOB elevated.

## 2014-08-01 NOTE — Sedation Documentation (Signed)
Dr. Earleen Newport aware of elevated Bp, discussed if pt took morning meds for Bp, pt stated he did.

## 2014-08-01 NOTE — Sedation Documentation (Addendum)
Moderate sedation instructions and wound care reviewed with pt and wife, verbalize understanding.  Unable to print d/c instructions today due to Epic problems. D/C via WC to exit.

## 2014-08-01 NOTE — Procedures (Signed)
Interventional Radiology Procedure Note  Procedure: US guided bx of left parotid gland.  2 x 18 G core biopsy.  .  Complications: No immediate Recommendations:  Routine analgesia. Routine dressing care.   Signed,  Dulcy Fanny. Earleen Newport, DO

## 2014-08-01 NOTE — Telephone Encounter (Signed)
Please let pt know the area we were concerned about appears unchanged, but still needs to be followed closely since it did not resolve or decrease with the rest of his changes. I would recommend doing a PET scan (like a ct but will light up things that may be cancer) to make sure we do not need to biopsy, then can followup if it doesn't light up.   Let me know if he is ok with this, and I can order.

## 2014-08-02 NOTE — Telephone Encounter (Signed)
Please advise PCC;s thanks 

## 2014-08-08 ENCOUNTER — Other Ambulatory Visit: Payer: Self-pay

## 2014-08-08 ENCOUNTER — Encounter (HOSPITAL_COMMUNITY): Payer: Self-pay

## 2014-08-08 MED ORDER — MIRTAZAPINE 15 MG PO TABS: 15.0000 mg | ORAL_TABLET | Freq: Every evening | ORAL | Status: DC | PRN

## 2014-08-08 MED ORDER — GLUCOSE BLOOD VI STRP: ORAL_STRIP | Status: AC

## 2014-08-09 ENCOUNTER — Encounter (HOSPITAL_COMMUNITY): Payer: Self-pay

## 2014-08-09 ENCOUNTER — Telehealth: Payer: Self-pay | Admitting: Pulmonary Disease

## 2014-08-09 ENCOUNTER — Encounter: Payer: Self-pay | Admitting: Internal Medicine

## 2014-08-09 ENCOUNTER — Other Ambulatory Visit: Payer: Self-pay | Admitting: *Deleted

## 2014-08-09 ENCOUNTER — Other Ambulatory Visit: Payer: Self-pay | Admitting: Otolaryngology

## 2014-08-09 ENCOUNTER — Encounter (HOSPITAL_COMMUNITY)
Admission: RE | Admit: 2014-08-09 | Discharge: 2014-08-09 | Disposition: A | Discharge status: Home / Self Care | Payer: Medicare Other | Source: Ambulatory Visit | Attending: Otolaryngology | Admitting: Otolaryngology

## 2014-08-09 DIAGNOSIS — Z87891 Personal history of nicotine dependence: Secondary | ICD-10-CM | POA: Insufficient documentation

## 2014-08-09 DIAGNOSIS — I252 Old myocardial infarction: Secondary | ICD-10-CM | POA: Insufficient documentation

## 2014-08-09 DIAGNOSIS — F419 Anxiety disorder, unspecified: Secondary | ICD-10-CM | POA: Insufficient documentation

## 2014-08-09 DIAGNOSIS — I2510 Atherosclerotic heart disease of native coronary artery without angina pectoris: Secondary | ICD-10-CM | POA: Diagnosis not present

## 2014-08-09 DIAGNOSIS — K219 Gastro-esophageal reflux disease without esophagitis: Secondary | ICD-10-CM | POA: Insufficient documentation

## 2014-08-09 DIAGNOSIS — F329 Major depressive disorder, single episode, unspecified: Secondary | ICD-10-CM | POA: Diagnosis not present

## 2014-08-09 DIAGNOSIS — I10 Essential (primary) hypertension: Secondary | ICD-10-CM | POA: Diagnosis not present

## 2014-08-09 DIAGNOSIS — E119 Type 2 diabetes mellitus without complications: Secondary | ICD-10-CM | POA: Diagnosis not present

## 2014-08-09 DIAGNOSIS — K449 Diaphragmatic hernia without obstruction or gangrene: Secondary | ICD-10-CM | POA: Diagnosis not present

## 2014-08-09 DIAGNOSIS — Z0000 Encounter for general adult medical examination without abnormal findings: Secondary | ICD-10-CM | POA: Diagnosis not present

## 2014-08-09 DIAGNOSIS — J449 Chronic obstructive pulmonary disease, unspecified: Secondary | ICD-10-CM | POA: Insufficient documentation

## 2014-08-09 DIAGNOSIS — E785 Hyperlipidemia, unspecified: Secondary | ICD-10-CM | POA: Diagnosis not present

## 2014-08-09 DIAGNOSIS — I739 Peripheral vascular disease, unspecified: Secondary | ICD-10-CM | POA: Insufficient documentation

## 2014-08-09 DIAGNOSIS — C07 Malignant neoplasm of parotid gland: Secondary | ICD-10-CM | POA: Insufficient documentation

## 2014-08-09 HISTORY — DX: Spontaneous ecchymoses: R23.3

## 2014-08-09 HISTORY — DX: Pneumonia, unspecified organism: J18.9

## 2014-08-09 HISTORY — DX: Other skin changes: R23.8

## 2014-08-09 LAB — COMPREHENSIVE METABOLIC PANEL
ALBUMIN: 3.3 g/dL — AB (ref 3.5–5.2)
ALK PHOS: 102 U/L (ref 39–117)
ALT: 47 U/L (ref 0–53)
AST: 31 U/L (ref 0–37)
Anion gap: 14 (ref 5–15)
BUN: 32 mg/dL — ABNORMAL HIGH (ref 6–23)
CO2: 19 mEq/L (ref 19–32)
Calcium: 9.1 mg/dL (ref 8.4–10.5)
Chloride: 109 mEq/L (ref 96–112)
Creatinine, Ser: 2.03 mg/dL — ABNORMAL HIGH (ref 0.50–1.35)
GFR calc Af Amer: 36 mL/min — ABNORMAL LOW (ref >90)
GFR calc non Af Amer: 31 mL/min — ABNORMAL LOW (ref >90)
GLUCOSE: 136 mg/dL — AB (ref 70–99)
Potassium: 4.5 mEq/L (ref 3.7–5.3)
SODIUM: 142 meq/L (ref 137–147)
TOTAL PROTEIN: 7.6 g/dL (ref 6.0–8.3)
Total Bilirubin: <0.2 mg/dL — ABNORMAL LOW (ref 0.3–1.2)

## 2014-08-09 LAB — CBC
HCT: 32.2 % — ABNORMAL LOW (ref 39.0–52.0)
HEMOGLOBIN: 10.2 g/dL — AB (ref 13.0–17.0)
MCH: 30.7 pg (ref 26.0–34.0)
MCHC: 31.7 g/dL (ref 30.0–36.0)
MCV: 97 fL (ref 78.0–100.0)
Platelets: 289 10*3/uL (ref 150–400)
RBC: 3.32 MIL/uL — ABNORMAL LOW (ref 4.22–5.81)
RDW: 15.2 % (ref 11.5–15.5)
WBC: 9.9 10*3/uL (ref 4.0–10.5)

## 2014-08-09 MED ORDER — LABETALOL HCL 200 MG PO TABS
200.0000 mg | ORAL_TABLET | Freq: Three times a day (TID) | ORAL | Status: DC
Start: 2014-08-09 — End: 2015-01-10

## 2014-08-09 NOTE — H&P (Signed)
PREOPERATIVE H&P  Chief Complaint: left parotid cancer  HPI: [name] is a 73 y.o. male who presents for evaluation of left parotid mass. 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 Over the last 2 months he developed an enlarging mass in the parotid gland. FNA demonstrated SCCa. CT scan showed an ill defined 2.5 cm left parotid mass with enlarged upper  Level II lymph node in the neck. He's taken to the OR for left parotidectomy and left neck dissection with planned post op XRT.  Past Medical History  Diagnosis Date  . Diabetes mellitus   . Hypertension   . Anxiety   . COPD (chronic obstructive pulmonary disease)   . Hyperlipemia   . GERD (gastroesophageal reflux disease)   . Barrett's esophagus with high grade dysplasia     s/p RFA  . PVD (peripheral vascular disease)   . CAD (coronary artery disease)   . Tobacco user   . Depression   . Hiatal hernia   . Myocardial infarction 12/1996  . AAA (abdominal aortic aneurysm)   . Renal insufficiency 2010  . Bruises easily     on hands  . Pneumonia 06/22/14   Past Surgical History  Procedure Laterality Date  . Tonsillectomy    . Nissen fundoplication    . Abdominal aortic aneurysm repair  2010    and right common iliac artery aneurysm, DR HAYES  . Upper gastrointestinal endoscopy  07/07/2011    barretts esophagus, hiatal hernia, gastritis, ablations in esophagus  . Colonoscopy  05/07/2005    diverticulosis, internal and external hemorrhoids  . Abdominal aortic aneurysm repair    . Radiofrequency ablation  multiple    Barrett's, high-grade dysplasia  . Colonoscopy N/A 03/01/2013    Procedure: COLONOSCOPY;  Surgeon: Gatha Mayer, MD;  Location: [facility];  Service: Endoscopy;  Laterality: N/A;  . Cardiac catheterization  1998    stent x 1   History   Social History  . Marital Status: Married    Spouse Name: N/A    Number of Children: 3  . Years of Education: N/A   Occupational History  . Retired Conservation officer, [employer]    Social History Main Topics  . Smoking status: Former Smoker -- 1.00 packs/day for 54 years    Types: Cigarettes    Quit date: 05/09/2014  . Smokeless tobacco: Never Used     Comment: pt uses e-cigs  . 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 Drug Use: No  . Sexual Activity: Not on file   Other Topics Concern  . Not on file   Social History Narrative   Regular Exercise -  NO   Family History  Problem Relation Age of Onset  . Coronary artery disease Other   . Diabetes Other   . Asthma Other   . Mental illness Mother     dementia  . Cancer Mother   . Diabetes Mother   . Heart disease Father   . Diabetes Father   . Stomach cancer Father   . Cancer Father   . Colon cancer Neg Hx   . Esophageal cancer Neg Hx   . Heart attack Neg Hx   . Stroke Neg Hx    Allergies  Allergen Reactions  . Amlodipine Besylate Swelling  . Benazepril Hcl Other (See Comments)    Elevates potassium levels  . Risperidone Other (See Comments)    loss of motor skills  .  Tramadol Nausea And Vomiting  . Valsartan Other (See Comments)    Elevates potassium levels  . Penicillins Itching and Rash   Prior to Admission medications   Medication Sig Start Date End Date Taking? Authorizing Provider  ALPRAZolam Duanne Moron) 0.5 MG tablet Take 0.5 mg by mouth 2 (two) times daily as needed for anxiety.   Yes Historical Provider, MD  aspirin EC 81 MG tablet Take 81 mg by mouth every morning.    Yes Historical Provider, MD  calcium-vitamin D (OSCAL WITH D) 500-200 MG-UNIT per tablet Take 1 tablet by mouth 3 (three) times daily.   Yes Historical Provider, MD  cholecalciferol (VITAMIN D) 1000 UNITS tablet Take 4,000 Units by mouth every morning.   Yes Historical Provider, MD  cloNIDine (CATAPRES) 0.1 MG tablet Take 0.1 mg by mouth 2 (two) times daily.   Yes Historical Provider, MD  FLUoxetine (PROZAC) 10 MG tablet Take 10 mg by mouth every morning.   Yes Historical Provider, MD  gabapentin  (NEURONTIN) 100 MG capsule Take 1 capsule (100 mg total) by mouth 2 (two) times daily. 05/30/14  Yes Aleksei Plotnikov V, MD  glimepiride (AMARYL) 1 MG tablet Take 1 tablet (1 mg total) by mouth 2 (two) times daily. 06/22/14  Yes Aleksei Plotnikov V, MD  iron polysaccharides (NIFEREX) 150 MG capsule Take 1 capsule (150 mg total) by mouth daily. 05/10/14  Yes Aleksei Plotnikov V, MD  labetalol (NORMODYNE) 200 MG tablet Take 1 tablet (200 mg total) by mouth 3 (three) times daily. 07/10/14  Yes Aleksei Plotnikov V, MD  Magnesium 300 MG CAPS Take 300 mg by mouth daily.   Yes Historical Provider, MD  Multiple Vitamin (MULTIVITAMIN WITH MINERALS) TABS tablet Take 1 tablet by mouth every morning.   Yes Historical Provider, MD  omeprazole (PRILOSEC) 40 MG capsule Take 1 capsule (40 mg total) by mouth 2 (two) times daily. 05/23/14  Yes Aleksei Plotnikov V, MD  sodium bicarbonate 650 MG tablet Take 1,300 mg by mouth every morning.    Yes Historical Provider, MD  temazepam (RESTORIL) 15 MG capsule Take 1-2 capsules (15-30 mg total) by mouth at bedtime as needed for sleep. 07/10/14  Yes Aleksei Plotnikov V, MD  glucose blood (ONE TOUCH TEST STRIPS) test strip Use as instructed 08/08/14   Aleksei Plotnikov V, MD  mirtazapine (REMERON) 15 MG tablet Take 1-2 tablets (15-30 mg total) by mouth at bedtime as needed (for sleep). 08/08/14   Aleksei Plotnikov V, MD  Umeclidinium-Vilanterol (ANORO ELLIPTA) 62.5-25 MCG/INH AEPB Inhale 1 puff into the lungs daily.    Historical Provider, MD     Positive ROS: negative  All other systems have been reviewed and were otherwise negative with the exception of those mentioned in the HPI and as above.  Physical Exam: There were no vitals filed for this visit.  General: Alert, no acute distress Oral: Normal oral mucosa and tonsils Nasal: Clear nasal passages Neck: 2-3 cm firm left parotid mass, normal facial nerve function. Minimal adenopathy left neck Ear: Ear canal is clear with  normal appearing TMs Cardiovascular: Regular rate and rhythm, no murmur.  Respiratory: Clear to auscultation Neurologic: Alert and oriented x 3   Assessment/Plan: PAROTID/LYMPH NODE CANCER LEFT SIDE Plan for Procedure(s): PAROTIDECTOMY WITH LIMITED LEFT NECK DISSECTION   Melony Overly, MD 08/09/2014 2:52 PM

## 2014-08-09 NOTE — Telephone Encounter (Signed)
Noted  

## 2014-08-09 NOTE — Telephone Encounter (Signed)
Called & spoke with the patient on 08/02/14 to get his PET scheduled.  Pt stated that he had a lot of medical expenses lately.  He stated he was going to contact the [place] to try to get them to cover this.  He stated he would call me back after he spoke with them.  Pt called back & stated he had spoken with someone at the [place] and the lady told him they would be scheduling the PET for him at [facility].  On 08/06/14, the PET was still not scheduled & I phoned the patient.  He stated he had not heard anything from the [place].  He said he still had my direct phone number and would call me once he heard from the [place] regarding them scheduling the PET scan. The patient understands this test needs to get done but he again stated that he had incurred a number of medical expenses lately & felt he needed to let the [place] cover this scan.

## 2014-08-09 NOTE — Pre-Procedure Instructions (Signed)
KYCEN SPALLA  08/09/2014   Your procedure is scheduled on:  Monday August 13, 2014 at 7:30 AM.  Report to [facility] Admitting at 5:30 AM.  Call this number if you have problems the morning of surgery: [phone]   Remember:   Do not eat food or drink liquids after midnight.   Take these medicines the morning of surgery with A SIP OF WATER: Alprazolam (Xanax) if needed, Clonidine (Catapres), Fluoxetine (Prozac), Gapabentin (Neurontin), Labetalol (Normodyne), Omeprazole (Prilosec), and Anora inhaler   Do NOT take any diabetic medications the morning of your surgery   Do not wear jewelry.  Do not wear lotions, powders, or cologne.   Men may shave face and neck.  Do not bring valuables to the hospital.  [facility] [place] is not responsible for any belongings or valuables.               Contacts, dentures or bridgework may not be worn into surgery.  Leave suitcase in the car. After surgery it may be brought to your room.  For patients admitted to the hospital, discharge time is determined by your treatment team.               Patients discharged the day of surgery will not be allowed to drive home.  Name and phone number of your driver: Family/Friend  Special Instructions:  Shower using CHG soap the night before and the morning of your surgery   Please read over the following fact sheets that you were given: Pain Booklet, Coughing and Deep Breathing and Surgical Site Infection Prevention

## 2014-08-09 NOTE — Progress Notes (Signed)
PCP is Dr. Alain Marion. Pulmonologist is Danton Sewer and Cardiologist is Dr. Angelena Form. Patient denied having any acute shortness of breath or chest pain. Nurse asked patient if he had any studies performed at the [facility] (PET, CT, Chest xray etc.) and patient stated he is supposed to have a PET scan at the [place] some time after this procedure. Nurse called Ebony Hail, PA to inquire if patient needed a repeat chest xray. Ebony Hail, [place] stated that it should be ok but if the assigned Anesthesiologist wanted one DOS then it could be obtained then.

## 2014-08-10 NOTE — Progress Notes (Signed)
Anesthesia Chart Review:  Patient is a 73 year old male scheduled for parotidectomy with limited left neck dissection on 08/13/14 by Dr. Melony Overly. Recent left parotid gland FNA 08/01/14 showed malignant cells consistent with SCC.  History includes recent former smoker, DM2, HTN, HLD, anxiety, depression, COPD, GERD, Barrett's esophagus s/p radiofrequency ablation, hiatal hernia, PVD with AAA repiar '10 (EVAR AAA with coil embolization of right hypogastric artery 12/2208), CAD/MI '98 with RCA stent, right PNA (felt to be aspiration) 05/2014, bruises easily, CKD (Dr. Justin Mend).  PCP is Dr. Alain Marion.  Pulmonologist is Dr. Gwenette Greet.  Cardiologist is Dr. Angelena Form.  He was last seen by cardiology on 07/25/14 by Richardson Dopp, PA-C for follow-up and preoperative evaluation prior to his needle biopsy. He was cleared for that procedure since he could achieve approximately 4 METS without symptoms to suggest angina and had low risk stress test within the past two years.  Studies:  - LHC (6/01): prox RCA stent patent, mid RCA 30%, prox and mid LAD 40%, dist sub-branch of CFX 40%, small dist AAA >> Med Rx  - Nuclear (11/13): Normal stress nuclear study. LV Ejection Fraction: 56%  - Carotid US (5/14): R < 39%, L 40-59%  - Abdominal US (12/13): Aorta and endograft patent - no leak  EKG on 07/25/14 showed: NSR, moderate voltage criteria for LVH, prolonged QT. Q waves in lead III, small in aVF.  CXR on 06/22/14 showed: Persistent but improved right middle lobe airspace opacity. The appearance is suggestive of residual atelectasis or scarring but continued follow-up is recommended. (Patient was seen by Dr. Gwenette Greet that day and felt "pateint has gotten over his recent episode of aspiration pneumonia."  However, due to finding of a spiculated masslike opacity on his 04/29/14 chest CT as well, he was planning 3 month follow-up CT which patient was trying to get scheduled at the [facility] due to mounting up medical bills.)  Chest  CT on 04/29/14 showed: 1. Large masslike consolidative opacity within the right upper and right middle lobes measuring up to 10 cm is favored to be infectious in etiology given the interval change between plain film radiographs 02/14/2014 and 04/29/2014. Continued radiographic followup is recommended to ensure resolution.  2. Spiculated masslike opacity within the right upper lobe is likely secondary to scarring from resolved prior infectious right upper lobe process. Recommend further evaluation with dedicated chest CT in 3 months to evaluate for continued interval resolution and/or stability to exclude malignancy.  3. Mildly prominent mediastinal lymph nodes favored to be reactive in etiology.  4. Probable asymmetric gynecomastia within the left chest wall. Recommend clinical and mammographic correlation as clinically indicated if concern for mass.  He had CT of the neck on 07/18/14 at an outside facility with images available for review in epic.  There was a limited one view chest image.  Patient denied any acute respiratory issues and was inclined not to repeat any more radiology exams at this PAT visit, so I will defer decision for additional preoperative CXR to his assigned anesthesiologist.    Preoperative labs noted. BUN/Cr 32/2.03, which overall appears stable. H/H 10.2/32.2, also appears stable.  He had recent pulmonology and cardiology follow-up.  In late August, Dr. Gwenette Greet thought he had recovered from his aspiration PNA.  He was cleared for his parotid needle biopsy last month by cardiology.  He has stopped smoking. His renal function appears stable.  If no acute changes then I would anticipate that he could proceed as planned. Further evaluation by  his assigned anesthesiologist on the day of surgery.  George Hugh [facility] Short Stay Center/Anesthesiology Phone [phone] 08/10/2014 11:44 AM

## 2014-08-13 ENCOUNTER — Inpatient Hospital Stay (HOSPITAL_COMMUNITY)
Admission: RE | Admit: 2014-08-13 | Discharge: 2014-08-15 | DRG: 129 | Disposition: A | Discharge status: Home / Self Care | Payer: Medicare Other | Source: Ambulatory Visit | Attending: Otolaryngology | Admitting: Otolaryngology

## 2014-08-13 ENCOUNTER — Encounter (HOSPITAL_COMMUNITY): Payer: Self-pay | Admitting: Certified Registered Nurse Anesthetist

## 2014-08-13 ENCOUNTER — Inpatient Hospital Stay (HOSPITAL_COMMUNITY): Payer: Medicare Other | Admitting: Certified Registered Nurse Anesthetist

## 2014-08-13 ENCOUNTER — Encounter (HOSPITAL_COMMUNITY)
Admission: RE | Disposition: A | Discharge status: Home / Self Care | Payer: Self-pay | Source: Ambulatory Visit | Attending: Otolaryngology

## 2014-08-13 ENCOUNTER — Encounter (HOSPITAL_COMMUNITY): Payer: Medicare Other | Admitting: Vascular Surgery

## 2014-08-13 DIAGNOSIS — I739 Peripheral vascular disease, unspecified: Secondary | ICD-10-CM | POA: Diagnosis present

## 2014-08-13 DIAGNOSIS — E785 Hyperlipidemia, unspecified: Secondary | ICD-10-CM | POA: Diagnosis present

## 2014-08-13 DIAGNOSIS — F419 Anxiety disorder, unspecified: Secondary | ICD-10-CM | POA: Diagnosis present

## 2014-08-13 DIAGNOSIS — K22711 Barrett's esophagus with high grade dysplasia: Secondary | ICD-10-CM | POA: Diagnosis present

## 2014-08-13 DIAGNOSIS — I252 Old myocardial infarction: Secondary | ICD-10-CM

## 2014-08-13 DIAGNOSIS — E119 Type 2 diabetes mellitus without complications: Secondary | ICD-10-CM | POA: Diagnosis present

## 2014-08-13 DIAGNOSIS — I10 Essential (primary) hypertension: Secondary | ICD-10-CM | POA: Diagnosis present

## 2014-08-13 DIAGNOSIS — Z85828 Personal history of other malignant neoplasm of skin: Secondary | ICD-10-CM

## 2014-08-13 DIAGNOSIS — Z720 Tobacco use: Secondary | ICD-10-CM

## 2014-08-13 DIAGNOSIS — C07 Malignant neoplasm of parotid gland: Principal | ICD-10-CM | POA: Diagnosis present

## 2014-08-13 DIAGNOSIS — J449 Chronic obstructive pulmonary disease, unspecified: Secondary | ICD-10-CM | POA: Diagnosis present

## 2014-08-13 DIAGNOSIS — I2510 Atherosclerotic heart disease of native coronary artery without angina pectoris: Secondary | ICD-10-CM | POA: Diagnosis present

## 2014-08-13 DIAGNOSIS — Z79899 Other long term (current) drug therapy: Secondary | ICD-10-CM

## 2014-08-13 DIAGNOSIS — F329 Major depressive disorder, single episode, unspecified: Secondary | ICD-10-CM | POA: Diagnosis present

## 2014-08-13 DIAGNOSIS — C770 Secondary and unspecified malignant neoplasm of lymph nodes of head, face and neck: Secondary | ICD-10-CM | POA: Diagnosis present

## 2014-08-13 DIAGNOSIS — Z87891 Personal history of nicotine dependence: Secondary | ICD-10-CM

## 2014-08-13 DIAGNOSIS — Z7982 Long term (current) use of aspirin: Secondary | ICD-10-CM

## 2014-08-13 DIAGNOSIS — Z955 Presence of coronary angioplasty implant and graft: Secondary | ICD-10-CM

## 2014-08-13 DIAGNOSIS — K219 Gastro-esophageal reflux disease without esophagitis: Secondary | ICD-10-CM | POA: Diagnosis present

## 2014-08-13 HISTORY — PX: PAROTIDECTOMY: SHX2163

## 2014-08-13 HISTORY — DX: Malignant (primary) neoplasm, unspecified: C80.1

## 2014-08-13 HISTORY — PX: PAROTIDECTOMY W/ NECK DISSECTION TOTAL: SUR1004

## 2014-08-13 HISTORY — DX: Type 2 diabetes mellitus without complications: E11.9

## 2014-08-13 HISTORY — DX: Secondary and unspecified malignant neoplasm of lymph node, unspecified: C77.9

## 2014-08-13 LAB — GLUCOSE, CAPILLARY
Glucose-Capillary: 129 mg/dL — ABNORMAL HIGH (ref 70–99)
Glucose-Capillary: 129 mg/dL — ABNORMAL HIGH (ref 70–99)

## 2014-08-13 SURGERY — EXCISION, PAROTID GLAND
Anesthesia: General | Site: Neck | Laterality: Left

## 2014-08-13 MED ORDER — GLYCOPYRROLATE 0.2 MG/ML IJ SOLN
INTRAMUSCULAR | Status: AC
  Filled 2014-08-13: qty 3

## 2014-08-13 MED ORDER — EPHEDRINE SULFATE 50 MG/ML IJ SOLN
INTRAMUSCULAR | Status: AC
  Filled 2014-08-13: qty 1

## 2014-08-13 MED ORDER — ALPRAZOLAM 0.5 MG PO TABS: 0.5000 mg | ORAL_TABLET | Freq: Two times a day (BID) | ORAL | Status: DC | PRN

## 2014-08-13 MED ORDER — LABETALOL HCL 200 MG PO TABS
200.0000 mg | ORAL_TABLET | Freq: Three times a day (TID) | ORAL | Status: DC
  Administered 2014-08-13 – 2014-08-14 (×5): 200 mg via ORAL
  Filled 2014-08-13 (×8): qty 1

## 2014-08-13 MED ORDER — ONDANSETRON HCL 4 MG/2ML IJ SOLN
INTRAMUSCULAR | Status: DC | PRN
  Administered 2014-08-13: 4 mg via INTRAVENOUS

## 2014-08-13 MED ORDER — 0.9 % SODIUM CHLORIDE (POUR BTL) OPTIME
TOPICAL | Status: DC | PRN
  Administered 2014-08-13: 1000 mL

## 2014-08-13 MED ORDER — SODIUM BICARBONATE 650 MG PO TABS
1300.0000 mg | ORAL_TABLET | Freq: Every morning | ORAL | Status: DC
  Administered 2014-08-13 – 2014-08-14 (×2): 1300 mg via ORAL
  Filled 2014-08-13 (×4): qty 2

## 2014-08-13 MED ORDER — ONDANSETRON HCL 4 MG PO TABS: 4.0000 mg | ORAL_TABLET | ORAL | Status: DC | PRN

## 2014-08-13 MED ORDER — FENTANYL CITRATE 0.05 MG/ML IJ SOLN
INTRAMUSCULAR | Status: DC | PRN
  Administered 2014-08-13 (×3): 50 ug via INTRAVENOUS
  Administered 2014-08-13: 100 ug via INTRAVENOUS

## 2014-08-13 MED ORDER — TEMAZEPAM 15 MG PO CAPS
15.0000 mg | ORAL_CAPSULE | Freq: Every evening | ORAL | Status: DC | PRN
  Administered 2014-08-13 – 2014-08-14 (×2): 15 mg via ORAL
  Filled 2014-08-13 (×2): qty 1

## 2014-08-13 MED ORDER — LIDOCAINE HCL (CARDIAC) 20 MG/ML IV SOLN
INTRAVENOUS | Status: AC
  Filled 2014-08-13: qty 5

## 2014-08-13 MED ORDER — ONDANSETRON HCL 4 MG/2ML IJ SOLN
INTRAMUSCULAR | Status: AC
  Filled 2014-08-13: qty 2

## 2014-08-13 MED ORDER — BACITRACIN ZINC 500 UNIT/GM EX OINT
1.0000 "application " | TOPICAL_OINTMENT | Freq: Three times a day (TID) | CUTANEOUS | Status: DC
  Administered 2014-08-13 – 2014-08-15 (×6): 1 via TOPICAL
  Filled 2014-08-13: qty 28.35

## 2014-08-13 MED ORDER — CEFAZOLIN SODIUM-DEXTROSE 2-3 GM-% IV SOLR
2.0000 g | INTRAVENOUS | Status: AC
  Administered 2014-08-13: 2 g via INTRAVENOUS
  Filled 2014-08-13: qty 50

## 2014-08-13 MED ORDER — ACETAMINOPHEN 650 MG RE SUPP: 650.0000 mg | RECTAL | Status: DC | PRN

## 2014-08-13 MED ORDER — NEOSTIGMINE METHYLSULFATE 10 MG/10ML IV SOLN
INTRAVENOUS | Status: DC | PRN
Start: 2014-08-13 — End: 2014-08-13
  Administered 2014-08-13: 4 mg via INTRAVENOUS

## 2014-08-13 MED ORDER — LIDOCAINE HCL 4 % MT SOLN
OROMUCOSAL | Status: DC | PRN
  Administered 2014-08-13: 4 mL via TOPICAL

## 2014-08-13 MED ORDER — MAGNESIUM OXIDE 400 (241.3 MG) MG PO TABS
400.0000 mg | ORAL_TABLET | Freq: Every day | ORAL | Status: DC
  Administered 2014-08-14: 400 mg via ORAL
  Filled 2014-08-13 (×2): qty 1

## 2014-08-13 MED ORDER — CALCIUM CARBONATE-VITAMIN D 500-200 MG-UNIT PO TABS
1.0000 | ORAL_TABLET | Freq: Three times a day (TID) | ORAL | Status: DC
  Administered 2014-08-13 – 2014-08-15 (×5): 1 via ORAL
  Filled 2014-08-13 (×8): qty 1

## 2014-08-13 MED ORDER — VITAMIN D3 25 MCG (1000 UNIT) PO TABS
4000.0000 [IU] | ORAL_TABLET | Freq: Every morning | ORAL | Status: DC
  Administered 2014-08-14: 4000 [IU] via ORAL
  Filled 2014-08-13 (×2): qty 4

## 2014-08-13 MED ORDER — MORPHINE SULFATE 2 MG/ML IJ SOLN: 2.0000 mg | INTRAMUSCULAR | Status: DC | PRN

## 2014-08-13 MED ORDER — ARTIFICIAL TEARS OP OINT
TOPICAL_OINTMENT | OPHTHALMIC | Status: DC | PRN
  Administered 2014-08-13: 1 via OPHTHALMIC

## 2014-08-13 MED ORDER — GLYCOPYRROLATE 0.2 MG/ML IJ SOLN
INTRAMUSCULAR | Status: DC | PRN
  Administered 2014-08-13: 0.6 mg via INTRAVENOUS

## 2014-08-13 MED ORDER — ASPIRIN EC 81 MG PO TBEC
81.0000 mg | DELAYED_RELEASE_TABLET | Freq: Every morning | ORAL | Status: DC
  Administered 2014-08-13 – 2014-08-14 (×2): 81 mg via ORAL
  Filled 2014-08-13 (×3): qty 1

## 2014-08-13 MED ORDER — FENTANYL CITRATE 0.05 MG/ML IJ SOLN
INTRAMUSCULAR | Status: AC
  Filled 2014-08-13: qty 5

## 2014-08-13 MED ORDER — PHENYLEPHRINE HCL 10 MG/ML IJ SOLN
10.0000 mg | INTRAVENOUS | Status: DC | PRN
  Administered 2014-08-13: 40 ug/min via INTRAVENOUS

## 2014-08-13 MED ORDER — PANTOPRAZOLE SODIUM 40 MG PO TBEC
40.0000 mg | DELAYED_RELEASE_TABLET | Freq: Every day | ORAL | Status: DC
Start: 2014-08-14 — End: 2014-08-15
  Administered 2014-08-14: 40 mg via ORAL
  Filled 2014-08-13: qty 1

## 2014-08-13 MED ORDER — OXYCODONE HCL 5 MG/5ML PO SOLN: 5.0000 mg | Freq: Once | ORAL | Status: DC | PRN

## 2014-08-13 MED ORDER — SODIUM CHLORIDE 0.9 % IJ SOLN
INTRAMUSCULAR | Status: AC
  Filled 2014-08-13: qty 10

## 2014-08-13 MED ORDER — LACTATED RINGERS IV SOLN
INTRAVENOUS | Status: DC | PRN
  Administered 2014-08-13 (×2): via INTRAVENOUS

## 2014-08-13 MED ORDER — ARTIFICIAL TEARS OP OINT
TOPICAL_OINTMENT | OPHTHALMIC | Status: AC
  Filled 2014-08-13: qty 3.5

## 2014-08-13 MED ORDER — ACETAMINOPHEN 160 MG/5ML PO SOLN: 650.0000 mg | ORAL | Status: DC | PRN

## 2014-08-13 MED ORDER — DEXAMETHASONE SODIUM PHOSPHATE 4 MG/ML IJ SOLN
INTRAMUSCULAR | Status: DC | PRN
  Administered 2014-08-13: 4 mg via INTRAVENOUS

## 2014-08-13 MED ORDER — EPHEDRINE SULFATE 50 MG/ML IJ SOLN
INTRAMUSCULAR | Status: DC | PRN
  Administered 2014-08-13 (×4): 5 mg via INTRAVENOUS
  Administered 2014-08-13: 10 mg via INTRAVENOUS

## 2014-08-13 MED ORDER — MAGNESIUM 300 MG PO CAPS: 300.0000 mg | ORAL_CAPSULE | Freq: Every day | ORAL | Status: DC

## 2014-08-13 MED ORDER — MIRTAZAPINE 15 MG PO TABS
15.0000 mg | ORAL_TABLET | Freq: Every evening | ORAL | Status: DC | PRN
  Administered 2014-08-14: 15 mg via ORAL
  Filled 2014-08-13: qty 2

## 2014-08-13 MED ORDER — GLUCOSE BLOOD VI STRP: 1.0000 | ORAL_STRIP | Status: DC | PRN

## 2014-08-13 MED ORDER — ONDANSETRON HCL 4 MG/2ML IJ SOLN: 4.0000 mg | Freq: Four times a day (QID) | INTRAMUSCULAR | Status: DC | PRN

## 2014-08-13 MED ORDER — MIDAZOLAM HCL 2 MG/2ML IJ SOLN
INTRAMUSCULAR | Status: AC
  Filled 2014-08-13: qty 2

## 2014-08-13 MED ORDER — CLONIDINE HCL 0.1 MG PO TABS
0.1000 mg | ORAL_TABLET | Freq: Two times a day (BID) | ORAL | Status: DC
  Administered 2014-08-13 – 2014-08-14 (×3): 0.1 mg via ORAL
  Filled 2014-08-13 (×5): qty 1

## 2014-08-13 MED ORDER — KCL IN DEXTROSE-NACL 20-5-0.45 MEQ/L-%-% IV SOLN
INTRAVENOUS | Status: DC
  Administered 2014-08-13 – 2014-08-14 (×3): via INTRAVENOUS
  Filled 2014-08-13 (×5): qty 1000

## 2014-08-13 MED ORDER — LIDOCAINE-EPINEPHRINE 1 %-1:100000 IJ SOLN
INTRAMUSCULAR | Status: DC | PRN
  Administered 2014-08-13: 20 mL

## 2014-08-13 MED ORDER — NEOSTIGMINE METHYLSULFATE 10 MG/10ML IV SOLN
INTRAVENOUS | Status: AC
  Filled 2014-08-13: qty 1

## 2014-08-13 MED ORDER — PROPOFOL 10 MG/ML IV BOLUS
INTRAVENOUS | Status: DC | PRN
  Administered 2014-08-13: 100 mg via INTRAVENOUS

## 2014-08-13 MED ORDER — HYDROCODONE-ACETAMINOPHEN 5-325 MG PO TABS
1.0000 | ORAL_TABLET | ORAL | Status: DC | PRN
  Administered 2014-08-13 – 2014-08-14 (×2): 2 via ORAL
  Filled 2014-08-13 (×2): qty 2

## 2014-08-13 MED ORDER — BACITRACIN ZINC 500 UNIT/GM EX OINT
TOPICAL_OINTMENT | CUTANEOUS | Status: AC
  Filled 2014-08-13: qty 15

## 2014-08-13 MED ORDER — FLUOXETINE HCL 10 MG PO CAPS
10.0000 mg | ORAL_CAPSULE | Freq: Every day | ORAL | Status: DC
  Administered 2014-08-14 – 2014-08-15 (×2): 10 mg via ORAL
  Filled 2014-08-13 (×3): qty 1

## 2014-08-13 MED ORDER — GABAPENTIN 100 MG PO CAPS
100.0000 mg | ORAL_CAPSULE | Freq: Two times a day (BID) | ORAL | Status: DC
  Administered 2014-08-13 – 2014-08-14 (×3): 100 mg via ORAL
  Filled 2014-08-13 (×5): qty 1

## 2014-08-13 MED ORDER — POLYSACCHARIDE IRON COMPLEX 150 MG PO CAPS
150.0000 mg | ORAL_CAPSULE | Freq: Every day | ORAL | Status: DC
  Administered 2014-08-13 – 2014-08-14 (×2): 150 mg via ORAL
  Filled 2014-08-13 (×3): qty 1

## 2014-08-13 MED ORDER — CEFAZOLIN SODIUM 1-5 GM-% IV SOLN
1.0000 g | Freq: Three times a day (TID) | INTRAVENOUS | Status: DC
  Administered 2014-08-13 – 2014-08-15 (×6): 1 g via INTRAVENOUS
  Filled 2014-08-13 (×8): qty 50

## 2014-08-13 MED ORDER — LIDOCAINE-EPINEPHRINE 1 %-1:100000 IJ SOLN
INTRAMUSCULAR | Status: AC
Start: 2014-08-13 — End: 2014-08-13
  Filled 2014-08-13: qty 1

## 2014-08-13 MED ORDER — LIDOCAINE HCL (CARDIAC) 20 MG/ML IV SOLN
INTRAVENOUS | Status: DC | PRN
Start: 2014-08-13 — End: 2014-08-13
  Administered 2014-08-13: 60 mg via INTRAVENOUS

## 2014-08-13 MED ORDER — ROCURONIUM BROMIDE 50 MG/5ML IV SOLN
INTRAVENOUS | Status: AC
  Filled 2014-08-13: qty 1

## 2014-08-13 MED ORDER — HYDROMORPHONE HCL 1 MG/ML IJ SOLN: 0.2500 mg | INTRAMUSCULAR | Status: DC | PRN

## 2014-08-13 MED ORDER — OXYCODONE HCL 5 MG PO TABS: 5.0000 mg | ORAL_TABLET | Freq: Once | ORAL | Status: DC | PRN

## 2014-08-13 MED ORDER — LABETALOL HCL 5 MG/ML IV SOLN
5.0000 mg | INTRAVENOUS | Status: AC | PRN
  Administered 2014-08-13 (×4): 5 mg via INTRAVENOUS

## 2014-08-13 MED ORDER — ADULT MULTIVITAMIN W/MINERALS CH
1.0000 | ORAL_TABLET | Freq: Every morning | ORAL | Status: DC
  Administered 2014-08-14: 1 via ORAL
  Filled 2014-08-13 (×2): qty 1

## 2014-08-13 MED ORDER — ROCURONIUM BROMIDE 100 MG/10ML IV SOLN
INTRAVENOUS | Status: DC | PRN
  Administered 2014-08-13: 50 mg via INTRAVENOUS

## 2014-08-13 MED ORDER — BACITRACIN ZINC 500 UNIT/GM EX OINT
TOPICAL_OINTMENT | CUTANEOUS | Status: DC | PRN
  Administered 2014-08-13: 1 via TOPICAL

## 2014-08-13 MED ORDER — LABETALOL HCL 5 MG/ML IV SOLN
INTRAVENOUS | Status: AC
  Administered 2014-08-13: 5 mg via INTRAVENOUS
  Filled 2014-08-13: qty 4

## 2014-08-13 MED ORDER — ONDANSETRON HCL 4 MG/2ML IJ SOLN: 4.0000 mg | INTRAMUSCULAR | Status: DC | PRN

## 2014-08-13 MED ORDER — FLUOXETINE HCL 20 MG PO TABS
10.0000 mg | ORAL_TABLET | Freq: Every day | ORAL | Status: DC
  Filled 2014-08-13: qty 1

## 2014-08-13 MED ORDER — PROPOFOL 10 MG/ML IV BOLUS
INTRAVENOUS | Status: AC
  Filled 2014-08-13: qty 20

## 2014-08-13 MED ORDER — GLIMEPIRIDE 1 MG PO TABS
1.0000 mg | ORAL_TABLET | Freq: Two times a day (BID) | ORAL | Status: DC
  Administered 2014-08-13 – 2014-08-15 (×4): 1 mg via ORAL
  Filled 2014-08-13 (×6): qty 1

## 2014-08-13 MED ORDER — UMECLIDINIUM-VILANTEROL 62.5-25 MCG/INH IN AEPB: 1.0000 | INHALATION_SPRAY | Freq: Every day | RESPIRATORY_TRACT | Status: DC

## 2014-08-13 SURGICAL SUPPLY — 61 items
ATTRACTOMAT 16X20 MAGNETIC DRP (DRAPES) ×2 IMPLANT
BLADE 10 SAFETY STRL DISP (BLADE) ×1 IMPLANT
BLADE SURG 12 STRL SS (BLADE) ×3 IMPLANT
BLADE SURG ROTATE 9660 (MISCELLANEOUS) IMPLANT
BNDG GAUZE ELAST 4 BULKY (GAUZE/BANDAGES/DRESSINGS) IMPLANT
CANISTER SUCTION 2500CC (MISCELLANEOUS) ×3 IMPLANT
CLEANER TIP ELECTROSURG 2X2 (MISCELLANEOUS) ×3 IMPLANT
CONT SPEC 4OZ CLIKSEAL STRL BL (MISCELLANEOUS) ×5 IMPLANT
CORDS BIPOLAR (ELECTRODE) ×2 IMPLANT
COVER SURGICAL LIGHT HANDLE (MISCELLANEOUS) ×3 IMPLANT
DRAIN HEMOVAC 1/8 X 5 (WOUND CARE) ×2 IMPLANT
DRAIN SNY 10 ROU (WOUND CARE) ×1 IMPLANT
DRAPE SURG 17X11 SM STRL (DRAPES) ×3 IMPLANT
ELECT COATED BLADE 2.86 ST (ELECTRODE) ×3 IMPLANT
ELECT REM PT RETURN 9FT ADLT (ELECTROSURGICAL) ×3
ELECTRODE REM PT RTRN 9FT ADLT (ELECTROSURGICAL) ×1 IMPLANT
EVACUATOR SILICONE 100CC (DRAIN) ×3 IMPLANT
GAUZE SPONGE 4X4 16PLY XRAY LF (GAUZE/BANDAGES/DRESSINGS) ×2 IMPLANT
GLOVE BIO SURGEON STRL SZ7 (GLOVE) ×4 IMPLANT
GLOVE BIOGEL PI IND STRL 6.5 (GLOVE) ×1 IMPLANT
GLOVE BIOGEL PI IND STRL 7.0 (GLOVE) ×2 IMPLANT
GLOVE BIOGEL PI IND STRL 8 (GLOVE) IMPLANT
GLOVE BIOGEL PI INDICATOR 6.5 (GLOVE) ×2
GLOVE BIOGEL PI INDICATOR 7.0 (GLOVE) ×4
GLOVE BIOGEL PI INDICATOR 8 (GLOVE) ×4
GLOVE ECLIPSE 7.0 STRL STRAW (GLOVE) ×2 IMPLANT
GLOVE SS BIOGEL STRL SZ 7.5 (GLOVE) ×1 IMPLANT
GLOVE SUPERSENSE BIOGEL SZ 7.5 (GLOVE) ×2
GLOVE SURG SS PI 6.5 STRL IVOR (GLOVE) ×2 IMPLANT
GLOVE SURG SS PI 7.5 STRL IVOR (GLOVE) ×6 IMPLANT
GOWN STRL REUS W/ TWL LRG LVL3 (GOWN DISPOSABLE) ×1 IMPLANT
GOWN STRL REUS W/ TWL XL LVL3 (GOWN DISPOSABLE) ×2 IMPLANT
GOWN STRL REUS W/TWL LRG LVL3 (GOWN DISPOSABLE) ×9
GOWN STRL REUS W/TWL XL LVL3 (GOWN DISPOSABLE) ×6
KIT BASIN OR (CUSTOM PROCEDURE TRAY) ×3 IMPLANT
KIT ROOM TURNOVER OR (KITS) ×3 IMPLANT
LOCATOR NERVE 3 VOLT (DISPOSABLE) ×2 IMPLANT
NS IRRIG 1000ML POUR BTL (IV SOLUTION) ×3 IMPLANT
PAD ARMBOARD 7.5X6 YLW CONV (MISCELLANEOUS) ×6 IMPLANT
PENCIL BUTTON HOLSTER BLD 10FT (ELECTRODE) ×3 IMPLANT
SPONGE INTESTINAL PEANUT (DISPOSABLE) ×2 IMPLANT
SPONGE LAP 18X18 X RAY DECT (DISPOSABLE) ×3 IMPLANT
STAPLER VISISTAT 35W (STAPLE) ×2 IMPLANT
SUT CHROMIC 3 0 PS 2 (SUTURE) ×5 IMPLANT
SUT ETHILON 5 0 P 3 18 (SUTURE) ×2
SUT ETHILON 8 0 BV130 4 (SUTURE) IMPLANT
SUT NYLON ETHILON 5-0 P-3 1X18 (SUTURE) ×1 IMPLANT
SUT SILK 2 0 (SUTURE) ×3
SUT SILK 2 0 FS (SUTURE) ×6 IMPLANT
SUT SILK 2-0 18XBRD TIE 12 (SUTURE) ×1 IMPLANT
SUT SILK 3 0 (SUTURE) ×3
SUT SILK 3-0 18XBRD TIE 12 (SUTURE) ×1 IMPLANT
SUT SILK 4 0 (SUTURE)
SUT SILK 4-0 18XBRD TIE 12 (SUTURE) IMPLANT
SUT VIC AB 3-0 FS2 27 (SUTURE) IMPLANT
TOWEL OR 17X24 6PK STRL BLUE (TOWEL DISPOSABLE) ×3 IMPLANT
TOWEL OR 17X26 10 PK STRL BLUE (TOWEL DISPOSABLE) ×3 IMPLANT
TRAY ENT MC OR (CUSTOM PROCEDURE TRAY) ×3 IMPLANT
TRAY FOLEY CATH 14FR (SET/KITS/TRAYS/PACK) ×2 IMPLANT
TRAY FOLEY CATH 14FRSI W/METER (CATHETERS) IMPLANT
WATER STERILE IRR 1000ML POUR (IV SOLUTION) ×1 IMPLANT

## 2014-08-13 NOTE — Transfer of Care (Signed)
Immediate Anesthesia Transfer of Care Note  Patient: [name]  Procedure(s) Performed: Procedure(s): PAROTIDECTOMY WITH LIMITED LEFT NECK DISSECTION (Left)  Patient Location: PACU  Anesthesia Type:General  Level of Consciousness: sedated  Airway & Oxygen Therapy: Patient Spontanous Breathing and Patient connected to nasal cannula oxygen  Post-op Assessment: Report given to PACU RN and Post -op Vital signs reviewed and stable  Post vital signs: Reviewed and stable  Complications: No apparent anesthesia complications

## 2014-08-13 NOTE — Progress Notes (Signed)
Nutrition Brief Note  Patient identified on the Malnutrition Screening Tool (MST) Report  Wt Readings from Last 15 Encounters:  08/13/14 165 lb (74.844 kg)  08/13/14 165 lb (74.844 kg)  08/09/14 163 lb (73.936 kg)  08/01/14 160 lb (72.576 kg)  07/25/14 159 lb (72.122 kg)  07/10/14 156 lb (70.761 kg)  06/22/14 152 lb 9.6 oz (69.219 kg)  05/30/14 147 lb 3.2 oz (66.769 kg)  05/16/14 147 lb (66.679 kg)  05/09/14 150 lb (68.04 kg)  04/29/14 178 lb (80.74 kg)  04/04/14 168 lb (76.204 kg)  02/14/14 174 lb 12.8 oz (79.289 kg)  02/06/14 174 lb (78.926 kg)  11/16/13 169 lb (76.658 kg)   [name] is a 73 y.o. male who presents for evaluation of left parotid mass. 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 Over the last 2 months he developed an enlarging mass in the parotid gland. FNA demonstrated SCCa. CT scan showed an ill defined 2.5 cm left parotid mass with enlarged upper Level II lymph node in the neck.  Pt s/p PAROTIDECTOMY WITH LIMITED LEFT NECK DISSECTION (Left) on 08/13/14.  Hx obtained from pt and wife at beside. Both deny weight loss. Reports good appetite PTA. Home diet is mechanical soft, with very tender meats. Observed pt eating lunch tray during visit and tolerating well. Both report no further nutritional needs or questions at this time.   Body mass index is 24.36 kg/(m^2). Patient meets criteria for normal weight rangr based on current BMI.   Current diet order is clear liquid, patient is consuming approximately n/a% of meals at this time. Labs and medications reviewed.   No nutrition interventions warranted at this time. If nutrition issues arise, please consult RD.   [name], RD, LDN Pager: [phone] After hours Pager: [phone]

## 2014-08-13 NOTE — Progress Notes (Signed)
Report rec'd from [facility], assuming care of patient for lunch relief

## 2014-08-13 NOTE — Progress Notes (Signed)
Post OP check Awake alert minimal pain. AF BP elevated Neck wound looks good with minimal swelling. JP output 20-30 cc Facial N function weak in the frontal branch, eye closes OK, lower branches working well Stable post op course

## 2014-08-13 NOTE — Interval H&P Note (Signed)
History and Physical Interval Note:  08/13/2014 7:29 AM  [name]  has presented today for surgery, with the diagnosis of PAROTID/LYMPH NODE CANCER LEFT SIDE  The various methods of treatment have been discussed with the patient and family. After consideration of risks, benefits and other options for treatment, the patient has consented to  Procedure(s): PAROTIDECTOMY WITH LIMITED LEFT NECK DISSECTION (Left) as a surgical intervention .  The patient's history has been reviewed, patient examined, no change in status, stable for surgery.  I have reviewed the patient's chart and labs.  Questions were answered to the patient's satisfaction.     [name]

## 2014-08-13 NOTE — Brief Op Note (Signed)
08/13/2014  10:43 AM  PATIENT:  [name]  73 y.o. male  PRE-OPERATIVE DIAGNOSIS:  PAROTID/LYMPH NODE CANCER LEFT SIDE  POST-OPERATIVE DIAGNOSIS:  PAROTID/LYMPH NODE CANCER LEFT SIDE  PROCEDURE:  Procedure(s): PAROTIDECTOMY WITH LIMITED LEFT NECK DISSECTION (Left)  SURGEON:  Surgeon(s) and Role:    * Rozetta Nunnery, MD - Primary    * Thornell Sartorius, MD - Assisting  PHYSICIAN ASSISTANT:   ASSISTANTS: Minna Merritts   ANESTHESIA:   general  EBL:  Total I/O In: 1300 [I.V.:1300] Out: 260 [Urine:160; Blood:100]  BLOOD ADMINISTERED:none  DRAINS: (10) Jackson-Pratt drain(s) with closed bulb suction in the left neck   LOCAL MEDICATIONS USED:  XYLOCAINE with EPI  8 cc  SPECIMEN:  Source of Specimen:  left parotid gland and left upper jugular nodes  DISPOSITION OF SPECIMEN:  PATHOLOGY  COUNTS:  YES  TOURNIQUET:  * No tourniquets in log *  DICTATION: .Other Dictation: Dictation Number [id number]  PLAN OF CARE: Admit to inpatient   PATIENT DISPOSITION:  PACU - hemodynamically stable.   Delay start of Pharmacological VTE agent (>24hrs) due to surgical blood loss or risk of bleeding: yes

## 2014-08-13 NOTE — Anesthesia Preprocedure Evaluation (Addendum)
Anesthesia Evaluation  Patient identified by MRN, date of birth, ID band Patient awake    Reviewed: Allergy & Precautions, H&P , NPO status , Patient's Chart, lab work & pertinent test results  Airway Mallampati: II  Neck ROM: full    Dental  (+) Edentulous Upper, Missing, Dental Advisory Given   Pulmonary COPDformer smoker,          Cardiovascular hypertension, + CAD, + Past MI and + Peripheral Vascular Disease     Neuro/Psych Anxiety Depression  Neuromuscular disease    GI/Hepatic hiatal hernia, GERD-  ,  Endo/Other  diabetes, Type 2  Renal/GU Renal InsufficiencyRenal disease     Musculoskeletal   Abdominal   Peds  Hematology   Anesthesia Other Findings   Reproductive/Obstetrics                          Anesthesia Physical Anesthesia Plan  ASA: III  Anesthesia Plan: General   Post-op Pain Management:    Induction: Intravenous  Airway Management Planned: Oral ETT  Additional Equipment:   Intra-op Plan:   Post-operative Plan: Extubation in OR  Informed Consent: I have reviewed the patients History and Physical, chart, labs and discussed the procedure including the risks, benefits and alternatives for the proposed anesthesia with the patient or authorized representative who has indicated his/her understanding and acceptance.     Plan Discussed with: CRNA, Anesthesiologist and Surgeon  Anesthesia Plan Comments:         Anesthesia Quick Evaluation

## 2014-08-13 NOTE — Progress Notes (Signed)
SBP 190'2-200; SR 80's. Dr Linna Caprice updated-new ord for IV Labetelol. Will cont to monitor closely.

## 2014-08-13 NOTE — Op Note (Signed)
NAME:  RYOTT, RAFFERTY NO.:  [id number]  MEDICAL RECORD NO.:  [id number]  LOCATION:  6N16C                        FACILITY:  [facility]  PHYSICIAN:  Leonides Sake. Lucia Gaskins, M.D.DATE OF BIRTH:  [birth year]  DATE OF PROCEDURE:  08/13/2014 DATE OF DISCHARGE:                              OPERATIVE REPORT   PREOPERATIVE DIAGNOSIS:  Left parotid squamous cell carcinoma.  POSTOPERATIVE DIAGNOSIS:  Left parotid squamous cell carcinoma.  OPERATIONS PERFORMED:  Left parotidectomy with facial nerve dissection and limited left upper neck dissection.  SURGEON:  Leonides Sake. Lucia Gaskins, M.D.  ASSISTANT SURGEON:  Minna Merritts, M.D.  ANESTHESIA:  General endotracheal.  COMPLICATIONS:  None.  BRIEF CLINICAL NOTE:  [name] is a 72 year old gentleman who has had an enlarging left parotid mass for several months.  Needle aspirate shows squamous cell carcinoma.  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 CT scan shows about a 3-cm left parotid mass, which is ill-defined and consistent with carcinoma as well as a few upper jugular nodes that are enlarged.  He was taken to the operating room at this time for left parotidectomy and left upper neck dissection with planned postoperative radiation therapy.  Preoperatively, he had reasonably good facial nerve function with no obvious weakness.  DESCRIPTION OF PROCEDURE:  After adequate endotracheal anesthesia, the patient received 2 g of Ancef IV preoperatively and neck was prepped with Betadine solution and draped out with sterile towels.  A parotid incision was made and extended down to the neck.  Subplatysmal flaps were elevated anteriorly over the parotid mass.  The mass extended all the way back down posteriorly to the cartilage of the external ear canal, but did not involve the cartilage, but the mass did extend anteriorly about 3 cm and superiorly up toward  the zygomatic arch region.  Dissection was then carried down in front of the cartilage of the ear down to identify the facial nerve trunk and the trunk was identified.  This was preserved.  First, dissection was carried out inferiorly and the inferior branches of the facial nerve were dissected out.  These were uninvolved with a cancer and got a clean margins inferiorly grossly.  However, the superior branch of the facial nerve that right into or adjacent to the cancer on the mass and several of the superior branches were actually had to be dissected out of the cancer bed.  The branches were preserved as much as possible, although few of them had to be sacrificed because of involvement of cancer.  Hemostasis was obtained with bipolar cautery.  The parotid and parotid mass were resected and sent as one specimen.  I would expect the lower division of the facial nerve functioned fairly well; however, the upper division involved cancer and I suspect several of these branches will not be that functional.  Especially the frontal branches.  After obtaining adequate hemostasis from the parotid bed, upper neck dissection was performed. There was a few palpable high jugular nodes.  Dissection was carried down around the sternocleidomastoid muscle, anteriorly extended the dissection to the submandibular gland.  There were no palpable nodes  around the submandibular gland.  The anterior facial vein was identified and then dissected back to the jugular vein.  The 12th and 11th cranial nerves were identified and preserved.  The upper jugular nodes were then dissected off of the internal jugular vein, which was preserved as was the 11th and 12th cranial nerves were identified and preserved.  This specimen, which included some palpable enlarged nodes as well as several smaller nodes were sent as a separate specimen as upper jugular node dissection.  Hemostasis was obtained with bipolar cautery.  A  Terrial Rhodes drain was brought out through a separate stab incision lower in the neck.  The wound defect was closed with 3-0 chromic sutures subcutaneously and a 5-0 nylon to reapproximate the skin edges.  The patient was awakened from anesthesia and transferred to the recovery room, postop doing well.  DISPOSITION:  The patient will be admitted to the surgical floor.          ______________________________ Leonides Sake Lucia Gaskins, M.D.     CEN/MEDQ  D:  08/13/2014  T:  08/13/2014  Job:  [id number]  cc:   Minna Merritts, M.D.

## 2014-08-13 NOTE — Anesthesia Procedure Notes (Signed)
Procedure Name: Intubation Date/Time: 08/13/2014 7:40 AM Performed by: Trixie Deis A Pre-anesthesia Checklist: Patient identified, Emergency Drugs available, Suction available, Patient being monitored and Timeout performed Patient Re-evaluated:Patient Re-evaluated prior to inductionOxygen Delivery Method: Circle system utilized Preoxygenation: Pre-oxygenation with 100% oxygen Intubation Type: IV induction Ventilation: Mask ventilation without difficulty and Oral airway inserted - appropriate to patient size Laryngoscope Size: Mac and 3 Grade View: Grade I Tube type: Oral Tube size: 7.5 mm Number of attempts: 1 Airway Equipment and Method: Stylet and LTA kit utilized Placement Confirmation: ETT inserted through vocal cords under direct vision,  breath sounds checked- equal and bilateral and positive ETCO2 Secured at: 23 cm Tube secured with: Tape Dental Injury: Teeth and Oropharynx as per pre-operative assessment

## 2014-08-13 NOTE — Progress Notes (Signed)
Patient only able to void 137mL 6 hours post-op after catheter removal.  Dr. Lucia Gaskins made aware & gave orders for In and Out cath if needed.  Bladder scan showed 296 mL and patient tried again to urinate on his own.  Unable to do so. In and Out catheter drained 4106mL urine.  Also discussed patient's elevated BP with Dr. Lucia Gaskins and received instruction to continue home PO blood pressure meds.

## 2014-08-13 NOTE — Progress Notes (Signed)
[id number]'H, sr 80's, pt awake alert, denies pain.    Dr Linna Caprice at bedside, updated. OK to tx pt to floor.

## 2014-08-13 NOTE — Anesthesia Postprocedure Evaluation (Signed)
  Anesthesia Post-op Note  Patient: [name]  Procedure(s) Performed: Procedure(s): PAROTIDECTOMY WITH LIMITED LEFT NECK DISSECTION (Left)  Patient Location: PACU  Anesthesia Type:General  Level of Consciousness: awake, alert  and oriented  Airway and Oxygen Therapy: Patient Spontanous Breathing and Patient connected to nasal cannula oxygen  Post-op Pain: mild  Post-op Assessment: Post-op Vital signs reviewed, Patient's Cardiovascular Status Stable, Respiratory Function Stable, Patent Airway, No signs of Nausea or vomiting and Pain level controlled  Post-op Vital Signs: stable  Last Vitals:  Filed Vitals:   08/13/14 1258  BP: 198/86  Pulse: 89  Temp: 36.7 C  Resp: 18    Complications: No apparent anesthesia complications

## 2014-08-14 NOTE — Care Management Note (Signed)
  Page 1 of 1   08/14/2014     10:23:01 AM CARE MANAGEMENT NOTE 08/14/2014  Patient:  PHUONG, MOFFATT   Account Number:  [id number]  Date Initiated:  08/14/2014  Documentation initiated by:  Magdalen Spatz  Subjective/Objective Assessment:     Action/Plan:   Anticipated DC Date:     Anticipated DC Plan:           Choice offered to / List presented to:             Status of service:   Medicare Important Message given?  YES (If response is "NO", the following Medicare IM given date fields will be blank) Date Medicare IM given:  08/14/2014 Medicare IM given by:  Magdalen Spatz Date Additional Medicare IM given:   Additional Medicare IM given by:    Discharge Disposition:    Per UR Regulation:    If discussed at Long Length of Stay Meetings, dates discussed:    Comments:

## 2014-08-14 NOTE — Progress Notes (Signed)
POD 1 AF VSS JP output  75 cc Path report showed 1 83f 2 intraparotid nodes + and 0 of 9 neck nodes + Neck wound doing well Facial N function paralysis of frontal branch, eye closes OK, normal lower branch function Stable post op course. Plan to discharge in am

## 2014-08-15 ENCOUNTER — Encounter (HOSPITAL_COMMUNITY): Payer: Self-pay | Admitting: Otolaryngology

## 2014-08-15 ENCOUNTER — Telehealth: Payer: Self-pay | Admitting: *Deleted

## 2014-08-15 MED ORDER — HYDROCODONE-ACETAMINOPHEN 5-325 MG PO TABS: 1.0000 | ORAL_TABLET | Freq: Four times a day (QID) | ORAL | Status: DC | PRN

## 2014-08-15 NOTE — Telephone Encounter (Signed)
Wife return call back she stated husband just want to wait and see his surgeon first. He will be starting radiation soon. Inform pt will let md know status...[name]

## 2014-08-15 NOTE — Progress Notes (Signed)
Discharge instructions and prescription for vicodin given and explained to pt and pt's wife.  Both verbalize understanding of all orders/instructions and deny any questions at this time.  IV removed and site is CDI.  JP removed by MD.  VSS.  Pt discharged to home with wife via volunteer w/c service with all belongings.  Pt in no s/s of distress. Syliva Overman

## 2014-08-15 NOTE — Progress Notes (Signed)
POD 2   Discharge AF VSS JP drain 45 cc yesterday and min in drain this am  Removed Neck wound doing well Min pain.  Discharge home Follow up 1 week Discharge note dictated [phone]

## 2014-08-15 NOTE — Discharge Instructions (Signed)
OK to get incision site wet Take your regular meds Tylenol or Hydrocodone 5 mg prn pain Return to office next Wed at 11 am Call office if any problems or questions   [phone]

## 2014-08-15 NOTE — Discharge Summary (Signed)
NAME:  [name], [name] NO.:  [id number]  MEDICAL RECORD NO.:  [id number]  LOCATION:  [id number]                        FACILITY:  [facility]  PHYSICIAN:  Leonides Sake. 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  OPERATION PERFORMED:  Left parotidectomy with facial nerve dissection and limited left neck dissection.  OTHER DIAGNOSES: 1. Chronic obstructive pulmonary disease. 2. Hypertension. 3. Renal insufficiency. 4. History of coronary artery disease.  CONSULTS:  None.  HOSPITAL COURSE:  The patient was admitted via the operating room on August 13, 2014 at which time, he underwent a left parotidectomy and left limited neck dissection.  He tolerated this well and was admitted to the surgical floor postoperatively on preoperative Ancef.  He had a JP drain placed and this was removed on 2nd postoperative day.  He remained afebrile throughout his hospitalization.  Following surgery, he had some weakness of the frontal branch of the facial nerve, but the remaining branches worked well.  He tolerated the diet well and following removal of his JP drain, he was discharged home on his 2nd postoperative day with no complications.  DISPOSITION:  The patient was discharged home.  He will follow up in my office in 1 week to have the sutures removed.  He will continue with his regular medications along with Tylenol, Motrin, or hydrocodone p.r.n. pain.  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          ______________________________ Leonides Sake. Lucia Gaskins, M.D.     CEN/MEDQ  D:  08/15/2014  T:  08/15/2014  Job:  [id number]

## 2014-08-15 NOTE — Telephone Encounter (Signed)
Called pt concerning TCM no answer LMOM (wife cell) to return call bck...[name]

## 2014-08-22 ENCOUNTER — Encounter: Payer: Self-pay | Admitting: Pulmonary Disease

## 2014-08-24 ENCOUNTER — Encounter: Payer: Self-pay | Admitting: *Deleted

## 2014-08-24 NOTE — Progress Notes (Signed)
Returned patient's call, answered his question re: length of time he can expect to be at [facility] when he comes for his consult appt with Dr.Squire next week.  I introduced myself as his Oncology Navigator, indicated I would be joining him when he comes next week.  I explained the role of a dental consultation for patients receiving RT to the neck area, indicated that I would be referring him to [facility] for an appt to be scheduled within a couple of days of seeing Dr. Isidore Moos.  I explained [place] arrival procedure.  He verbalized understanding of information provided.  Gayleen Orem, RN, BSN, [place] at [place] [phone]

## 2014-08-27 ENCOUNTER — Telehealth: Payer: Self-pay | Admitting: *Deleted

## 2014-08-27 NOTE — Telephone Encounter (Signed)
Returned patient's VM.  Explained, again, the purpose of the dental evaluation he has scheduled with Dr. Enrique Sack tomorrow morning.  Explained arrival procedure for this appt at [facility], explained current valet service.  He verbalized understanding.  Gayleen Orem, RN, BSN, [place] at [place] [phone]

## 2014-08-28 ENCOUNTER — Encounter (HOSPITAL_COMMUNITY): Payer: Self-pay | Admitting: Dentistry

## 2014-08-28 ENCOUNTER — Ambulatory Visit (HOSPITAL_COMMUNITY): Payer: Self-pay | Admitting: Dentistry

## 2014-08-28 VITALS — BP 185/72 | HR 68 | Temp 97.7°F

## 2014-08-28 DIAGNOSIS — K0530 Chronic periodontitis, unspecified: Secondary | ICD-10-CM

## 2014-08-28 DIAGNOSIS — K083 Retained dental root: Secondary | ICD-10-CM

## 2014-08-28 DIAGNOSIS — Z972 Presence of dental prosthetic device (complete) (partial): Secondary | ICD-10-CM

## 2014-08-28 DIAGNOSIS — IMO0002 Reserved for concepts with insufficient information to code with codable children: Secondary | ICD-10-CM

## 2014-08-28 DIAGNOSIS — C07 Malignant neoplasm of parotid gland: Secondary | ICD-10-CM

## 2014-08-28 DIAGNOSIS — K0889 Other specified disorders of teeth and supporting structures: Secondary | ICD-10-CM

## 2014-08-28 DIAGNOSIS — K08103 Complete loss of teeth, unspecified cause, class III: Secondary | ICD-10-CM

## 2014-08-28 DIAGNOSIS — K029 Dental caries, unspecified: Secondary | ICD-10-CM

## 2014-08-28 DIAGNOSIS — K117 Disturbances of salivary secretion: Secondary | ICD-10-CM

## 2014-08-28 DIAGNOSIS — M264 Malocclusion, unspecified: Secondary | ICD-10-CM

## 2014-08-28 DIAGNOSIS — K0820 Unspecified atrophy of edentulous alveolar ridge: Secondary | ICD-10-CM

## 2014-08-28 DIAGNOSIS — R682 Dry mouth, unspecified: Secondary | ICD-10-CM

## 2014-08-28 DIAGNOSIS — Z0189 Encounter for other specified special examinations: Secondary | ICD-10-CM

## 2014-08-28 DIAGNOSIS — K036 Deposits [accretions] on teeth: Secondary | ICD-10-CM

## 2014-08-28 DIAGNOSIS — Z01818 Encounter for other preprocedural examination: Secondary | ICD-10-CM

## 2014-08-28 NOTE — Progress Notes (Signed)
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 male who presented with left parotid mass. He was 1-2 years s/p excision of a left temporal skin cancer. Over the last 2 months before most recent surgery, he developed an enlarging mass in the parotid gland. FNA demonstrated SCCa. CT scan showed an ill defined 2.5 cm left parotid mass with enlarged upper Level II lymph node in the neck.   He was discussed at tumor board and taken to the OR for left parotidectomy and left neck dissection on 08-13-14 with planned post op XRT.  He had a CT of his Chest without contrast in Sept, ordered by Dr. Gwenette Greet, for chest lesions.  This is felt to be scarring but possibly due to cancer. He has a smoking history (currently "vapes.")  PET has been on hold.  Parotidectomy and resection of left superior jugular nodes occurred on 08-13-14 by Dr Lucia Gaskins:  1. Parotid gland, Left - SQUAMOUS CELL CARCINOMA, 3.1 CM. - CARCINOMA IS PRESENT AT INKED NON ORIENTED TISSUE EDGE(S). - METASTATIC CARCINOMA IN 1 OF 2 INTRAPAROTID LYMPH NODES (1/2). - SEE ONCOLOGY TABLE BELOW. 2. Lymph nodes, regional resection, Left superior jugular - THERE IS NO EVIDENCE OF CARCINOMA IN 9 OF 9 LYMPH NODES (0/9).  Dr. Enrique Sack recommended dental extractions yesterday but patient is fairly adamant about cancelling his operation for tomorrow.  Post operatively, since parotidectomy and neck dissection, he has a dry mouth, numbness of left ear, and left facial droop (mainly upper face/brow).  Former  Smoker -- Cigarettes- 1.00 packs/day for 77 years Quit date: 05/09/2014: Uses E-Cigarettes: Alcohol Use: Comment: sober x 25 years    PREVIOUS RADIATION THERAPY: No  PAST MEDICAL HISTORY:  has a past medical history of Hypertension; Anxiety; COPD (chronic obstructive pulmonary disease); Hyperlipemia; GERD (gastroesophageal reflux disease); Barrett's esophagus with high grade dysplasia; PVD (peripheral vascular disease); CAD (coronary artery disease); Tobacco user; Depression; Hiatal hernia; Myocardial infarction (12/1996); AAA (abdominal aortic aneurysm); Renal insufficiency (2010); Bruises easily; Pneumonia (06/22/14); Type II diabetes mellitus; Lymph node cancer; and Cancer.    PAST SURGICAL HISTORY: Past Surgical History  Procedure Laterality Date  . Tonsillectomy    . Nissen fundoplication    . Abdominal aortic aneurysm repair  2010    and right common iliac artery aneurysm, DR HAYES  . Upper gastrointestinal endoscopy  07/07/2011    barretts esophagus, hiatal hernia, gastritis, ablations in esophagus  . Colonoscopy  05/07/2005    diverticulosis, internal and external hemorrhoids  . Radiofrequency ablation  multiple    Barrett's, high-grade dysplasia  . Colonoscopy N/A 03/01/2013    Procedure: COLONOSCOPY;  Surgeon: Gatha Mayer, MD;  Location: [facility];  Service: Endoscopy;  Laterality: N/A;  . Coronary angioplasty with stent placement  1998    "1"  . Parotidectomy w/ neck dissection total Left 08/13/2014    PARTIAL NECK DISSECTION  . Hernia repair    . Parotidectomy Left 08/13/2014    Procedure: PAROTIDECTOMY WITH LIMITED LEFT NECK DISSECTION;  Surgeon: Rozetta Nunnery, MD;  Location: [place];  Service:  ENT;  Laterality: Left;    FAMILY HISTORY: family history includes Asthma in his other; Cancer in his father and mother; Coronary artery disease in his other; Diabetes in his father, mother, and other; Heart disease in his father; Mental illness in his mother; Stomach cancer in  his father. There is no history of Colon cancer, Esophageal cancer, Heart attack, or Stroke.  SOCIAL HISTORY:  reports that he quit smoking about 3 months ago. His smoking use included Cigarettes. He has a 58 pack-year smoking history. He has never used smokeless tobacco. He reports that he does not drink alcohol or use illicit drugs.  ALLERGIES: Amlodipine besylate; Benazepril hcl; Risperidone; Tramadol; Valsartan; and Penicillins  MEDICATIONS:  Current Outpatient Prescriptions  Medication Sig Dispense Refill  . ALPRAZolam (XANAX) 0.5 MG tablet Take 0.5 mg by mouth 2 (two) times daily as needed for anxiety.      Marland Kitchen aspirin EC 81 MG tablet Take 81 mg by mouth every morning.       . calcium-vitamin D (OSCAL WITH D) 500-200 MG-UNIT per tablet Take 1 tablet by mouth 3 (three) times daily.      . cholecalciferol (VITAMIN D) 1000 UNITS tablet Take 4,000 Units by mouth every morning.      . cloNIDine (CATAPRES) 0.1 MG tablet Take 0.1 mg by mouth 2 (two) times daily.      Marland Kitchen FLUoxetine (PROZAC) 10 MG tablet Take 10 mg by mouth every morning.      . gabapentin (NEURONTIN) 100 MG capsule Take 1 capsule (100 mg total) by mouth 2 (two) times daily.  180 capsule  3  . glimepiride (AMARYL) 1 MG tablet Take 1 tablet (1 mg total) by mouth 2 (two) times daily.  180 tablet  3  . glucose blood (ONE TOUCH TEST STRIPS) test strip Use as instructed  300 each  3  . iron polysaccharides (NIFEREX) 150 MG capsule Take 1 capsule (150 mg total) by mouth daily.  30 capsule  2  . labetalol (NORMODYNE) 200 MG tablet Take 1 tablet (200 mg total) by mouth 3 (three) times daily.  270 tablet  2  . Magnesium 300 MG CAPS Take 300 mg by mouth daily.      . Multiple Vitamin (MULTIVITAMIN WITH MINERALS) TABS tablet Take 1 tablet by mouth every morning.      Marland Kitchen omeprazole (PRILOSEC) 40 MG capsule Take 1 capsule (40 mg total) by mouth 2 (two) times daily.  180 capsule  3  . sodium bicarbonate 650 MG tablet Take 1,300 mg by mouth every  morning.       . temazepam (RESTORIL) 15 MG capsule Take 1-2 capsules (15-30 mg total) by mouth at bedtime as needed for sleep.  60 capsule  3  . Umeclidinium-Vilanterol (ANORO ELLIPTA) 62.5-25 MCG/INH AEPB Inhale 1 puff into the lungs daily.      Marland Kitchen HYDROcodone-acetaminophen (NORCO/VICODIN) 5-325 MG per tablet Take 1 tablet by mouth every 6 (six) hours as needed for moderate pain.  20 tablet  0  . mirtazapine (REMERON) 15 MG tablet Take 1-2 tablets (15-30 mg total) by mouth at bedtime as needed (for sleep).  90 tablet  3  . ONETOUCH DELICA LANCETS FINE MISC        No current facility-administered medications for this encounter.    REVIEW OF SYSTEMS:  Notable for that above.   PHYSICAL EXAM:  height is 5\' 9"  (1.753 m) and weight is 161 lb 9.6 oz (73.301 kg). His temperature is 97.8 F (  36.6 C). His blood pressure is 186/86 and his pulse is 70.   General: Alert and oriented, in no acute distress HEENT: Left facial droop, especially in brown. Oropharynx is clear but dry.  Surgical scars healing well. Neck:  Scars healing, no palpable cervical or supraclavicular lymphadenopathy. Heart: Regular in rate and rhythm  Chest: Clear to auscultation bilaterally, with no rhonchi, wheezes, or rales. Abdomen: Soft, nontender, nondistended, with no rigidity or guarding. Extremities: No cyanosis or edema. Lymphatics: see neck Skin: scaly skin lesions from sun exposure Musculoskeletal: symmetric strength and muscle tone throughout. Neurologic: left facial droop Psychiatric: Judgment and insight are intact. Affect is appropriate.   ECOG = 1  0 - Asymptomatic (Fully active, able to carry on all predisease activities without restriction)  1 - Symptomatic but completely ambulatory (Restricted in physically strenuous activity but ambulatory and able to carry out work of a light or sedentary nature. For example, light housework, office work)  2 - Symptomatic, <50% in bed during the day (Ambulatory and  capable of all self care but unable to carry out any work activities. Up and about more than 50% of waking hours)  3 - Symptomatic, >50% in bed, but not bedbound (Capable of only limited self-care, confined to bed or chair 50% or more of waking hours)  4 - Bedbound (Completely disabled. Cannot carry on any self-care. Totally confined to bed or chair)  5 - Death   Eustace Pen MM, Creech RH, Tormey DC, et al. [phone]). "Toxicity and response criteria of the [facility]". [place] Oncol. 5 (6): 649-55   LABORATORY DATA:  Lab Results  Component Value Date   WBC 9.9 08/09/2014   HGB 10.2* 08/09/2014   HCT 32.2* 08/09/2014   MCV 97.0 08/09/2014   PLT 289 08/09/2014   CMP     Component Value Date/Time   NA 142 08/09/2014 1135   K 4.5 08/09/2014 1135   CL 109 08/09/2014 1135   CO2 19 08/09/2014 1135   GLUCOSE 136* 08/09/2014 1135   GLUCOSE 131* 10/13/2006 0754   BUN 32* 08/09/2014 1135   CREATININE 2.03* 08/09/2014 1135   CALCIUM 9.1 08/09/2014 1135   CALCIUM 9.6 03/23/2013 0743   PROT 7.6 08/09/2014 1135   ALBUMIN 3.3* 08/09/2014 1135   AST 31 08/09/2014 1135   ALT 47 08/09/2014 1135   ALKPHOS 102 08/09/2014 1135   BILITOT <0.2* 08/09/2014 1135   GFRNONAA 31* 08/09/2014 1135   GFRAA 36* 08/09/2014 1135         RADIOGRAPHY: US Biopsy  08/01/2014   INDICATION: 73 year old male presents with left parotid mass for biopsy  EXAM: ULTRASOUND GUIDED LEFT NECK BIOPSY  COMPARISON:  CT 07/18/2014  MEDICATIONS: Fentanyl 1.0 mcg IV; Versed 50 mg IV  ANESTHESIA/SEDATION: Total Moderate Sedation time  81.2 minutes  COMPLICATIONS: None immediate  PROCEDURE: Informed written consent was obtained from the patient and the patient's family after a discussion of the risks, benefits and alternatives to treatment. The patient understands and consents the procedure. A timeout was performed prior to the initiation of the procedure.  Ultrasound survey of the left new parotid region was performed with images  stored and sent to PACs.  The patient was then prepped and draped in the usual sterile fashion. The skin and subcutaneous tissues overlying the region for biopsy were generously infiltrated with 1% lidocaine for local anesthesia.  Using ultrasound guidance, an 18 gauge core biopsy gun was advanced into the lesion. Both grayscale and color  ultrasound imaging was used for a safe biopsy. Two separate 18 gauge core biopsy were retrieved.  Sterile dressing was placed.  The samples were placed into both saline and formalin for transportation to the lab.  No complications were encountered and no significant blood loss was encountered.  The patient tolerated the procedure well and remained hemodynamically stable throughout.  IMPRESSION: Status post ultrasound-guided biopsy of left parotid mass. Tissue specimen sent to pathology for complete histopathologic analysis.  Signed,  Dulcy Fanny. Earleen Newport, DO  [facility]  [facility]   Electronically Signed   By: Corrie Mckusick O.D.   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 Plan is as below:  1) scheduled PET for Nov 6th to followup CT chest results.  2) Gayleen Orem, RN, our Head and Neck Oncology Navigator will cancel dental extractions as patient is firm that he doesn't want these. Educated on the risks of declining extractions.  Appreciate Dr. Ritta Slot help.  I will do my best to stay off of the jaw/tooth roots during RT planning.  Oral Hygiene recommendations /trismus prevention to be provided by Dr. Raliegh Ip.  3) Will refer to social work for social support  4) Will refer to nutrition for nutrition support  5) Will refer to PT for potential  deconditioning (he we see #3-5 at multidisciplanary clinic next week)  6) Simulation in mid November to allow more healing and for PET results to return. Anticipate 6 weeks of RT.    It was a pleasure meeting the patient today. We discussed the risks, benefits, and side effects of radiotherapy.  We talked in detail about acute and late effects. He understands that some of the most bothersome acute effects will be significant soreness of the mouth and throat, changes in taste, changes in salivary function, skin irritation, hair loss, dehydration, weight loss and fatigue. We talked about late effects which include but are not necessarily limited to  hypothyroidism, dry mouth, trismus, dental issues. No guarantees of treatment were given. A consent form was signed and placed in the patient's medical record. The patient is enthusiastic about proceeding with treatment. I look forward to participating in the patient's care.  __________________________________________   Eppie Gibson, MD

## 2014-08-28 NOTE — Progress Notes (Addendum)
Head and Neck Cancer Location of Tumor / Histology: Squamous Cell Carcinoma of the Left Parotid Gland   [name] is a 73 y.o who  is 1 year s/p excision of left temporal skin cancer. Over the last 2 months he developed an enlarging mass in the parotid gland. FNA demonstrated SCCa. CT scan showed an ill defined 2.5 cm left parotid mass with enlarged upper Level II lymph node in the neck.   Biopsies of Left parotid Gland and Left Neck Dissection(Facial Nerve Dissection) revealed:    and limited left neck dissection   08/13/14 Diagnosis 1. Parotid gland, Left - SQUAMOUS CELL CARCINOMA, 3.1 CM. - CARCINOMA IS PRESENT AT INKED NON ORIENTED TISSUE EDGE(S). - METASTATIC CARCINOMA IN 1 OF 2 INTRAPAROTID LYMPH NODES (1/2). - SEE ONCOLOGY TABLE BELOW. 2. Lymph nodes, regional resection, Left superior jugular - THERE IS NO EVIDENCE OF CARCINOMA IN 9 OF 9 LYMPH NODES (0/9).  Nutrition Status:  Weight changes: 5 lbs as of 09/03/14  Swallowing status: Denies pain or difficulty swallowing  Plans, if any, for PEG tube:  Tobacco/Marijuana/Snuff/ETOH use: Former Smoker -- Cigarettes- 1.00 packs/day for 56 years Quit date: 05/09/2014: Uses E-Cigarettes: Alcohol Use: Comment: sober x 25 years    Past/Anticipated interventions by otolaryngology, if any: Dr. Harrell Gave Newman/ Biopsy of left parotid gland  Past/Anticipated interventions by medical oncology, if any: Dr. Heath Lark 09/05/14  Referrals yet, to any of the following?  Social Work?   Dentistry? 08/28/14  Swallowing therapy? Serafina Royals 09/05/14  Nutrition? Ernestene Kiel - 11/415  Med/Onc? Dr. Heath Lark 09/05/14  PEG placement?   SAFETY ISSUES:  Prior radiation? NO  Pacemaker/ICD?NO  Possible current pregnancy? N/A  Is the patient on methotrexate? NO  Current Complaints / other details:  Admits to feeling depressed.  See Screening tool

## 2014-08-28 NOTE — Patient Instructions (Addendum)
RADIATION THERAPY AND DECISIONS REGARDING YOUR TEETH  Xerostomia (dry mouth) Your salivary glands may be in the filed of radiation.  Radiation may include all or part of your saliva glands.  This will cause your saliva to dry up and you will have a dry mouth.  The dry mouth will be for the rest of your life unless your radiation oncologist tells you otherwise.  Your saliva has many functions:  Saliva wets your tongue for speaking.  It coats your teeth and the inside of your mouth for easier movement.  It helps with chewing and swallowing food.  It helps clean away harmful acid and toxic products made by the germs in your mouth, therefore it helps prevent cavities.  It kills some germs in your mouth and helps to prevent gum disease.  It helps to carry flavor to your taste buds.  Once you have lost your saliva you will be at higher risk for tooth decay and gum disease.  What can be done to help improve your mouth when there's not enough saliva:  1.  Your dentist may give a prescription for Salagen.  It will not bring back all of your saliva but may bring back some of it.  Also your saliva may be thick and ropy or white and foamy. It will not feel like it use to feel.  2.  You will need to swish with water every time your mouth feels dry.  YOU CANNOT suck on any cough drops, mints, lemon drops, candy, vitamin C or any other products.  You cannot use anything other than water to make your mouth feel less dry.  If you want to drink anything else you have to drink it all at once and brush afterwards.  Be sure to discuss the details of your diet habits with your dentist or hygienist.  Radiation caries: This is decay that happens very quickly once your mouth is very dry due to radiation therapy.  Normally cavities take six months to two years to become a problem.  When you have dry mouth cavities may take as little as eight weeks to cause you a problem.  This is why dental check ups every two  months are necessary as long as you have a dry mouth. Radiation caries typically, but not always, start at your gum line where it is hard to see the cavity.  It is therefore also hard to fill these cavities adequately.  This high rate of cavities happens because your mouth no longer has saliva and therefore the acid made by the germs starts the decay process.  Whenever you eat anything the germs in your mouth change the food into acid.  The acid then burns a small hole in your tooth.  This small hole is the beginning of a cavity.  If this is not treated then it will grow bigger and become a cavity.  The way to avoid this hole getting bigger is to use fluoride every evening as prescribed by your dentist.  You have to make sure that your teeth are very clean before you use the fluoride.  This fluoride in turn will strengthen your teeth and prepare them for another day of fighting acid.  If you develop radiation caries many times the damage is so large that you will have to have all your teeth removed.  This could be a big problem if some of these teeth are in the field of radiation.  Further details of why this could be   a big problem will follow.  (See Osteoradionecrosis).  Loss of taste (dysgeusia) This happens to varying degrees once you've had radiation therapy to your jaw region.  Many times taste is not completely lost but becomes limited.  The loss of taste is mostly due to radiation affecting your taste buds.  However if you have no saliva in your mouth to carry the flavor to your taste buds it would be difficult for your taste buds to taste anything.  That is why using water or a prescription for Salagen prior to meals and during meals may help with some of the taste.  Keep in mind that taste generally returns very slowly over the course of several months or several years after radiation therapy.  Don't give up hope.  Trismus According to your Radiation Oncologist your TMJ or jaw joints are going to be  partially or fully in the field of radiation.  This means that over time the muscles that help you open and close your mouth may get stiff.  This will potentially result in your not being able to open your mouth wide enough or as wide as you can open it now.  Le me give you an example of how slowly this happens and how unaware people are of it.  A gentlemen that had radiation therapy two years ago came back to me complaining that bananas are just too large for him to be able to fit them in between his teeth.  He was not able to open wide enough to bite into a banana.  This happens slowly and over a period of time.  What do we do to try and prevent this?  Your dentist will probably give you a stack of sticks called a trismus exercise device .  This stack will help your remind your muscles and your jaw joint to open up to the same distance every day.  Use these sticks every morning when you wake up according to the instructions given by the dentist.   You must use these sticks for at least one to two years after radiation therapy.  The reason for that is because it happens so slowly and keeps going on for about two years after radiation therapy.  Your hospital dentist will help you monitor your mouth opening and make sure that it's not getting smaller.  Osteoradionecrosis (ORN) This is a condition where your jaw bone after having had radiation therapy becomes very dry.  It has very little blood supply to keep it alive.  If you develop a cavity that turns into an abscess or an infection then the jaw bone does not have enough blood supply to help fight the infection.  At this point it is very likely that the infection could cause the death of your jaw bone.  When you have dead bone it has to be removed.  Therefore you might end up having to have surgery to remove part of your jaw bone, the part of the jaw bone that has been affected.   Healing is also a problem if you are to have surgery in the areas where the bone  has had radiation therapy.  The same reasons apply.  If you have surgery you need more blood supply which is not available.  When blood supply and oxygen are not available again, there is a chance for the bone to die.  Occasionally ORN happens on its own with no obvious reason.  This is quite rare.  We believe that   patients who continue to smoke and/or drink alcohol have a higher chance of having this bone problem.  Therefore once your jaw bone has had radiation therapy if there are any teeth in that area, you should never have them pulled.  You should also never have any surgery on your teeth or gums in that area unless the oral surgeon or Periodontist is aware of your history of radiation. There is some expensive management techniques that might be used to limit your risks.  The risks for ORN either from infection or spontaneous ( or on it's own) are life long.    TRISMUS  Trismus is a condition where the jaw does not allow the mouth to open as wide as it usually does.  This can happen almost suddenly, or in other cases the process is so slow, it is hard to notice it-until it is too far along.  When the jaw joints and/or muscles have been exposed to radiation treatments, the onset of Trismus is very slow.  This is because the muscles are losing their stretching ability over a long period of time, as long as 2 YEARS after the end of radiation.  It is therefore important to exercise these muscles and joints.  TRISMUS EXERCISES   Stack of tongue depressors measuring the same or a little less than the last documented MIO (Maximum Interincisal Opening).  Secure them with a rubber band on both ends.  Place the stack in the patient's mouth, supporting the other end.  Allow 30 seconds for muscle stretching.  Rest for a few seconds.  Repeat 3-5 times  For all radiation patients, this exercise is recommended in the mornings and evenings unless otherwise instructed.  The exercise should be done for  a period of 2 YEARS after the end of radiation.  MIO should be checked routinely on recall dental visits by the general dentist or the hospital dentist.  The patient is advised to report any changes, soreness, or difficulties encountered when doing the exercises.  MOUTH CARE AFTER SURGERY  FACTS:  Ice used in ice bag helps keep the swelling down, and can help lessen the pain.  It is easier to treat pain BEFORE it happens.  Spitting disturbs the clot and may cause bleeding to start again, or to get worse.  Smoking delays healing and can cause complications.  Sharing prescriptions can be dangerous.  Do not take medications not recently prescribed for you.  Antibiotics may stop birth control pills from working.  Use other means of birth control while on antibiotics.  Warm salt water rinses after the first 24 hours will help lessen the swelling:  Use 1/2 teaspoonful of table salt per oz.of water.  DO NOT:  Do not spit.  Do not drink through a straw.  Strongly advised not to smoke, dip snuff or chew tobacco at least for 3 days.  Do not eat sharp or crunchy foods.  Avoid the area of surgery when chewing.  Do not stop your antibiotics before your instructions say to do so.  Do not eat hot foods until bleeding has stopped.  If you need to, let your food cool down to room temperature.  EXPECT:  Some swelling, especially first 2-3 days.  Soreness or discomfort in varying degrees.  Follow your dentist's instructions about how to handle pain before it starts.  Pinkish saliva or light blood in saliva, or on your pillow in the morning.  This can last around 24 hours.  Bruising inside or outside  the mouth.  This may not show up until 2-3 days after surgery.  Don't worry, it will go away in time.  Pieces of "bone" may work themselves loose.  It's OK.  If they bother you, let us know.  WHAT TO DO IMMEDIATELY AFTER SURGERY:  Bite on the gauze with steady pressure for 1-2 hours.  Don't  chew on the gauze.  Do not lie down flat.  Raise your head support especially for the first 24 hours.  Apply ice to your face on the side of the surgery.  You may apply it 20 minutes on and a few minutes off.  Ice for 8-12 hours.  You may use ice up to 24 hours.  Before the numbness wears off, take a pain pill as instructed.  Prescription pain medication is not always required.  SWELLING:  Expect swelling for the first couple of days.  It should get better after that.  If swelling increases 3 days or so after surgery; let us know as soon as possible.  FEVER:  Take Tylenol every 4 hours if needed to lower your temperature, especially if it is at 100F or higher.  Drink lots of fluids.  If the fever does not go away, let us know.  BREATHING TROUBLE:  Any unusual difficulty breathing means you have to have someone bring you to the emergency room ASAP  BLEEDING:  Light oozing is expected for 24 hours or so.  Prop head up with pillows  Avoid spitting  Do not confuse bright red fresh flowing blood with lots of saliva colored with a little bit of blood.  If you notice some bleeding, place gauze or a tea bag where it is bleeding and apply CONSTANT pressure by biting down for 1 hour.  Avoid talking during this time.  Do not remove the gauze or tea bag during this hour to "check" the bleeding.  If you notice bright RED bleeding FLOWING out of particular area, and filling the floor of your mouth, put a wad of gauze on that area, bite down firmly and constantly.  Call us immediately.  If we're closed, have someone bring you to the emergency room.  ORAL HYGIENE:  Brush your teeth as usual after meals and before bedtime.  Use a soft toothbrush around the area of surgery.  DO NOT AVOID BRUSHING.  Otherwise bacteria(germs) will grow and may delay healing or encourage infection.  Since you cannot spit, just gently rinse and let the water flow out of your mouth.  DO NOT SWISH  HARD.  EATING:  Cool liquids are a good point to start.  Increase to soft foods as tolerated.  PRESCRIPTIONS:  Follow the directions for your prescriptions exactly as written.  If Dr. Enrique Sack gave you a narcotic pain medication, do not drive, operate machinery or drink alcohol when on that medication.  QUESTIONS:  Call our office during office hours [phone] or call the Emergency Room at [phone].

## 2014-08-28 NOTE — Progress Notes (Signed)
DENTAL CONSULTATION  Date of Consultation:  08/28/2014 Patient Name:   [name] Date of Birth:   11/21/1940 Medical Record Number: [id number]  VITALS: BP 185/72  Pulse 68  Temp(Src) 97.7 F (36.5 C) (Oral)  CHIEF COMPLAINT: Patient referred by Dr. Isidore Moos for a medically necessary preradiation therapy dental protocol examination.  HPI: [name] is a 73 year old male recently diagnosed with left parotid cancer. Patient had a left parotidectomy and neck dissection on 08/13/2014 with Dr. Radene Journey. Patient with anticipated radiation therapy with Dr. Isidore Moos. Patient is now seen as part of a medically necessary preradiation therapy dental protocol examination.  The patient currently denies acute toothaches, swellings, or abscesses. Patient last seen by a Dentist 10 years ago to have a tooth pulled. This was with [facility]. Patient has no regular primary dentist. Patient had an upper complete denture fabricated approximately 10 years ago. Patient has no lower partial denture. Patient indicates that the upper complete denture "don't fit". This was fabricated by a Pharmacist, community on [employer].  PROBLEM LIST: Patient Active Problem List   Diagnosis Date Noted  . Cancer of parotid gland 08/13/2014    Priority: High  . Insomnia 07/10/2014  . Generalized anxiety disorder 07/10/2014  . Anorexia 05/16/2014  . Hyperkalemia 05/02/2014  . Acute renal insufficiency 04/29/2014  . PNA (pneumonia) 04/29/2014  . Abnormal LFTs 04/29/2014  . Acute bronchitis 04/04/2014  . Squamous cell carcinoma of skin of face 06/04/2013  . Tremors of nervous system 04/10/2013  . Normocytic anemia 03/27/2013  . Hypomagnesemia 03/24/2013  . Failure to thrive in adult 03/23/2013  . Hypocalcemia with severe weakness 03/23/2013  . Hypoglycemia 03/23/2013  . Protein-calorie malnutrition, severe 03/23/2013  . Orthostatic hypotension 03/18/2013  . Hypokalemia 03/16/2013  . Metabolic acidosis 42/87/6811  . Renal  failure, acute on chronic 03/15/2013  . Pneumonia 03/14/2013  . Elevated WBC count 03/14/2013  . Subacute confusional state 03/14/2013  . Right Lung mass 03/14/2013  . Frequent falls 03/14/2013  . CKD (chronic kidney disease) stage 3, GFR 30-59 ml/min 03/14/2013  . Dehydration 03/14/2013  . Leukocytosis 03/14/2013  . Syncope 03/14/2013  . Transaminitis 03/14/2013  . Loss of weight 03/13/2013  . Falls frequently 03/13/2013  . Fever and chills 03/13/2013  . Chronic diarrhea 02/27/2013  . Lymphocytic colitis 10/25/2012  . Nausea alone 10/25/2012  . Actinic keratoses 10/07/2012  . Renal failure (ARF), acute on chronic 08/11/2012  . Elbow pain 04/12/2012  . 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 HTN CKD UNS W/CKD STAGE I THRU STAGE IV/UNS 12/22/2010  . Neoplasm of uncertain behavior of skin 09/19/2010  . CKD (chronic kidney disease) 01/22/2010  . HYPERKALEMIA 10/09/2009  . PERIPHERAL VASCULAR DISEASE 03/11/2009  . CHRONIC OBSTRUCTIVE PULMONARY DISEASE, ACUTE EXACERBATION 06/16/2008  . TOBACCO USE DISORDER/SMOKER-SMOKING CESSATION DISCUSSED 05/17/2008  . DEPRESSION 12/23/2007  . HYPERLIPIDEMIA 08/31/2007  . ANXIETY 08/31/2007  . INSOMNIA, PERSISTENT 08/31/2007  . CORONARY ARTERY DISEASE 08/31/2007  . AAA 08/31/2007  . COPD 08/31/2007  . GERD 08/31/2007  . Barrett's esophagus with high grade dysplasia 05/07/2005    PMH: Past Medical History  Diagnosis Date  . Hypertension   . Anxiety   . COPD (chronic obstructive pulmonary disease)   . Hyperlipemia   . GERD (gastroesophageal reflux disease)   . Barrett's esophagus with high grade dysplasia     s/p RFA  . PVD (peripheral vascular disease)   . CAD (coronary artery disease)   .  Tobacco user   . Depression   . Hiatal hernia   . Myocardial infarction 12/1996  . AAA (abdominal aortic aneurysm)   . Renal insufficiency 2010  . Bruises easily      on hands  . Pneumonia 06/22/14  . Type II diabetes mellitus   . 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 Cancer     PAROTID/LYMPH NODE CANCER LEFT SIDE    PSH: Past Surgical History  Procedure Laterality Date  . Tonsillectomy    . Nissen fundoplication    . Abdominal aortic aneurysm repair  2010    and right common iliac artery aneurysm, DR HAYES  . Upper gastrointestinal endoscopy  07/07/2011    barretts esophagus, hiatal hernia, gastritis, ablations in esophagus  . Colonoscopy  05/07/2005    diverticulosis, internal and external hemorrhoids  . Radiofrequency ablation  multiple    Barrett's, high-grade dysplasia  . Colonoscopy N/A 03/01/2013    Procedure: COLONOSCOPY;  Surgeon: Gatha Mayer, MD;  Location: [facility];  Service: Endoscopy;  Laterality: N/A;  . Coronary angioplasty with stent placement  1998    "1"  . Parotidectomy w/ neck dissection total Left 08/13/2014    PARTIAL NECK DISSECTION  . Hernia repair    . Parotidectomy Left 08/13/2014    Procedure: PAROTIDECTOMY WITH LIMITED LEFT NECK DISSECTION;  Surgeon: Rozetta Nunnery, MD;  Location: [place];  Service: ENT;  Laterality: Left;    ALLERGIES: Allergies  Allergen Reactions  . Amlodipine Besylate Swelling  . Benazepril Hcl Other (See Comments)    Elevates potassium levels  . Risperidone Other (See Comments)    loss of motor skills  . Tramadol Nausea And Vomiting  . Valsartan Other (See Comments)    Elevates potassium levels  . Penicillins Itching and Rash    MEDICATIONS: Current Outpatient Prescriptions  Medication Sig Dispense Refill  . ALPRAZolam (XANAX) 0.5 MG tablet Take 0.5 mg by mouth 2 (two) times daily as needed for anxiety.      Marland Kitchen aspirin EC 81 MG tablet Take 81 mg by mouth every morning.       . calcium-vitamin D (OSCAL WITH D) 500-200 MG-UNIT per tablet Take 1 tablet by mouth 3 (three) times daily.      . cholecalciferol (VITAMIN D) 1000 UNITS tablet Take 4,000 Units by  mouth every morning.      . cloNIDine (CATAPRES) 0.1 MG tablet Take 0.1 mg by mouth 2 (two) times daily.      Marland Kitchen FLUoxetine (PROZAC) 10 MG tablet Take 10 mg by mouth every morning.      . gabapentin (NEURONTIN) 100 MG capsule Take 1 capsule (100 mg total) by mouth 2 (two) times daily.  180 capsule  3  . glimepiride (AMARYL) 1 MG tablet Take 1 tablet (1 mg total) by mouth 2 (two) times daily.  180 tablet  3  . glucose blood (ONE TOUCH TEST STRIPS) test strip Use as instructed  300 each  3  . HYDROcodone-acetaminophen (NORCO/VICODIN) 5-325 MG per tablet Take 1 tablet by mouth every 6 (six) hours as needed for moderate pain.  20 tablet  0  . iron polysaccharides (NIFEREX) 150 MG capsule Take 1 capsule (150 mg total) by mouth daily.  30 capsule  2  . labetalol (NORMODYNE) 200 MG tablet Take 1 tablet (200 mg total) by mouth 3 (three) times daily.  270 tablet  2  . Magnesium 300 MG CAPS Take 300 mg  by mouth daily.      . mirtazapine (REMERON) 15 MG tablet Take 1-2 tablets (15-30 mg total) by mouth at bedtime as needed (for sleep).  90 tablet  3  . Multiple Vitamin (MULTIVITAMIN WITH MINERALS) TABS tablet Take 1 tablet by mouth every morning.      Marland Kitchen omeprazole (PRILOSEC) 40 MG capsule Take 1 capsule (40 mg total) by mouth 2 (two) times daily.  180 capsule  3  . sodium bicarbonate 650 MG tablet Take 1,300 mg by mouth every morning.       . temazepam (RESTORIL) 15 MG capsule Take 1-2 capsules (15-30 mg total) by mouth at bedtime as needed for sleep.  60 capsule  3  . Umeclidinium-Vilanterol (ANORO ELLIPTA) 62.5-25 MCG/INH AEPB Inhale 1 puff into the lungs daily.       No current facility-administered medications for this visit.    LABS: Lab Results  Component Value Date   WBC 9.9 08/09/2014   HGB 10.2* 08/09/2014   HCT 32.2* 08/09/2014   MCV 97.0 08/09/2014   PLT 289 08/09/2014      Component Value Date/Time   NA 142 08/09/2014 1135   K 4.5 08/09/2014 1135   CL 109 08/09/2014 1135   CO2 19 08/09/2014  1135   GLUCOSE 136* 08/09/2014 1135   GLUCOSE 131* 10/13/2006 0754   BUN 32* 08/09/2014 1135   CREATININE 2.03* 08/09/2014 1135   CALCIUM 9.1 08/09/2014 1135   CALCIUM 9.6 03/23/2013 0743   GFRNONAA 31* 08/09/2014 1135   GFRAA 36* 08/09/2014 1135   Lab Results  Component Value Date   INR 0.99 08/01/2014   INR 1.27 03/15/2013   INR 1.21 03/14/2013   No results found for this basename: PTT    SOCIAL HISTORY: History   Social History  . Marital Status: Married    Spouse Name: N/A    Number of Children: 3  . Years of Education: N/A   Occupational History  . Retired Leisure centre manager    Social History Main Topics  . Smoking status: Former Smoker -- 1.00 packs/day for 58 years    Types: Cigarettes    Quit date: 05/09/2014  . Smokeless tobacco: Never Used     Comment: pt uses e-cigs  . 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 Drug Use: No  . Sexual Activity: Not Currently   Other Topics Concern  . Not on file   Social History Narrative   Regular Exercise -  NO    FAMILY HISTORY: Family History  Problem Relation Age of Onset  . Coronary artery disease Other   . Diabetes Other   . Asthma Other   . Mental illness Mother     dementia  . Cancer Mother   . Diabetes Mother   . Heart disease Father   . Diabetes Father   . Stomach cancer Father   . Cancer Father   . Colon cancer Neg Hx   . Esophageal cancer Neg Hx   . Heart attack Neg Hx   . Stroke Neg Hx     REVIEW OF SYSTEMS: Reviewed with the patient and included in the dental record.  DENTAL HISTORY: CHIEF COMPLAINT: Patient referred by Dr. Isidore Moos for a medically necessary preradiation therapy dental protocol examination .  HPI: [name] is a 73 year old male recently diagnosed with left parotid cancer. Patient had a left parotidectomy and neck dissection on 08/13/2014 with Dr. Radene Journey. Patient with anticipated radiation  therapy with Dr. Isidore Moos. Patient is now seen as part of a medically necessary  preradiation therapy dental protocol examination.  The patient currently denies acute toothaches, swellings, or abscesses. Patient last seen by a Dentist 10 years ago to have a tooth pulled. This was with [facility]. Patient has no regular primary dentist. Patient had an upper complete denture fabricated approximately 10 years ago. Patient has no lower partial denture. Patient indicates that the upper complete denture "don't fit". This was fabricated by a Pharmacist, community on [employer].  DENTAL EXAMINATION: GENERAL: The patient is well-developed, well-nourished male in no acute distress. HEAD AND NECK: There is no right neck lymphadenopathy. The left neck is consistent with left parotidectomy and neck dissection on 08/13/2014. Healing appears to be progressing well. Patient denies acute TMJ symptoms. INTRAORAL EXAM: Patient has xerostomia. There is no evidence of oral abscess formation. There is atrophy of the edentulous alveolar ridges. There is no clinical evidence of retained root in the area of the upper right quadrant molar #2. DENTITION: Patient is missing tooth numbers 1, 3 through 16, 17, 18, 19, 23, 24, 25, 29, 30, 31, and 32. There is a retained root tip in the area of #2 radiographically.This is close to the maxillary right sinus and there is no evidence of periapical pathology associated with this root tip. There are also retained roots in the area of tooth numbers 20, 21, 26, 27, and 28.  PERIODONTAL: Patient has chronic periodontitis with plaque and calculus accumulations, gingival recession, and tooth mobility. There is moderate bone loss. DENTAL CARIES/SUBOPTIMAL RESTORATIONS:  There are dental caries associated with remaining teeth. ENDODONTIC: Patient currently denies acute pulpitis symptoms. I do not see any evidence of periapical pathology at this time. CROWN AND BRIDGE: There are no Crown or Bridge restorations. PROSTHODONTIC: Patient has an upper complete denture. Patient has no lower  partial denture. Patient indicates that the upper denture "don't fit". The patient did not bring the upper denture with him. OCCLUSION: Poor occlusal scheme/malocclusion.  RADIOGRAPHIC INTERPRETATION: An orthopantogram was taken and supplemented with 6 periapical radiographs. There are multiple missing teeth. There is a retained root tip in the area of #2 that is close to the maxillary sinus. There are multiple retained roots in the area of tooth numbers 20, 21, 26, 27, and 28. Dental caries are noted. There is moderate bone loss. There is no apparent evidence of periapical pathology.   ASSESSMENTS: 1. Cancer of the left parotid gland-- status post left parotidectomy and neck dissection 2. Preradiation therapy dental protocol 3. Multiple retained root segments 4. Chronic periodontitis of bone loss 5. Accretions 6. Gingival recession 7. Tooth mobility 8. Dental caries 9. Multiple missing teeth 10. Ill-fitting maxillary complete denture with no lower partial denture 11. Xerostomia 12. Dry, fissured tongue. 13. Poor occlusal scheme and malocclusion 14. Risk for complications up to and including death due to overall history of cardiovascular and respiratory compromise.  PLAN/RECOMMENDATIONS: 1. I discussed the risks, benefits, and complications of various treatment options with the patient in relationship to his medical and dental conditions, anticipated radiation therapy, and radiation therapy side effects to include xerostomia, radiation caries, trismus, mucositis, taste changes, gum and jawbone changes, and risk for infection and osteoradionecrosis. We discussed various treatment options to include no treatment, multiple extractions with alveoloplasty, pre-prosthetic surgery as indicated, periodontal therapy, dental restorations, root canal therapy, crown and bridge therapy, implant therapy, and replacement of missing teeth as indicated. The patient currently wishes to proceed with  extraction  of tooth numbers 20, 21, 22, 26, 27, and 28 with alveoloplasty in the operating room with general anesthesia. Retained root tip #2 will be left as is secondary to potential complication for sinus involvement associated with its removal. The patient will then follow up with a dentist of his choice for fabrication of upper and lower complete dentures after adequate healing and approximately 3 months after the last radiation therapy treatment has been delivered. The dental operating procedure has been scheduled for this coming Thursday, 08/30/2014 at 9:15 AM at [facility].  2. Discussion of findings with medical team and coordination of future medical and dental care as needed.  I spent in excess of  90 minutes during the conduct of this consultation and >50% of this time involved direct face-to-face encounter for counseling and/or coordination of the patient's care.   Lenn Cal, DDS

## 2014-08-29 ENCOUNTER — Ambulatory Visit
Admission: RE | Admit: 2014-08-29 | Discharge: 2014-08-29 | Disposition: A | Discharge status: Home / Self Care | Payer: Medicare Other | Source: Ambulatory Visit | Attending: Radiation Oncology | Admitting: Radiation Oncology

## 2014-08-29 ENCOUNTER — Encounter: Payer: Self-pay | Admitting: *Deleted

## 2014-08-29 ENCOUNTER — Telehealth: Payer: Self-pay | Admitting: Pulmonary Disease

## 2014-08-29 ENCOUNTER — Encounter: Payer: Self-pay | Admitting: Radiation Oncology

## 2014-08-29 VITALS — BP 186/86 | HR 70 | Temp 97.8°F | Ht 69.0 in | Wt 161.6 lb

## 2014-08-29 DIAGNOSIS — R29810 Facial weakness: Secondary | ICD-10-CM | POA: Diagnosis not present

## 2014-08-29 DIAGNOSIS — Z79899 Other long term (current) drug therapy: Secondary | ICD-10-CM | POA: Diagnosis not present

## 2014-08-29 DIAGNOSIS — J449 Chronic obstructive pulmonary disease, unspecified: Secondary | ICD-10-CM | POA: Insufficient documentation

## 2014-08-29 DIAGNOSIS — K219 Gastro-esophageal reflux disease without esophagitis: Secondary | ICD-10-CM | POA: Insufficient documentation

## 2014-08-29 DIAGNOSIS — C770 Secondary and unspecified malignant neoplasm of lymph nodes of head, face and neck: Secondary | ICD-10-CM | POA: Insufficient documentation

## 2014-08-29 DIAGNOSIS — C07 Malignant neoplasm of parotid gland: Secondary | ICD-10-CM

## 2014-08-29 DIAGNOSIS — Z87891 Personal history of nicotine dependence: Secondary | ICD-10-CM | POA: Insufficient documentation

## 2014-08-29 DIAGNOSIS — Z7982 Long term (current) use of aspirin: Secondary | ICD-10-CM | POA: Insufficient documentation

## 2014-08-29 DIAGNOSIS — R200 Anesthesia of skin: Secondary | ICD-10-CM | POA: Diagnosis not present

## 2014-08-29 DIAGNOSIS — E119 Type 2 diabetes mellitus without complications: Secondary | ICD-10-CM | POA: Insufficient documentation

## 2014-08-29 DIAGNOSIS — C44300 Unspecified malignant neoplasm of skin of unspecified part of face: Secondary | ICD-10-CM

## 2014-08-29 DIAGNOSIS — F419 Anxiety disorder, unspecified: Secondary | ICD-10-CM | POA: Insufficient documentation

## 2014-08-29 DIAGNOSIS — I10 Essential (primary) hypertension: Secondary | ICD-10-CM | POA: Diagnosis not present

## 2014-08-29 DIAGNOSIS — R1310 Dysphagia, unspecified: Secondary | ICD-10-CM | POA: Insufficient documentation

## 2014-08-29 DIAGNOSIS — Z510 Encounter for antineoplastic radiation therapy: Secondary | ICD-10-CM | POA: Diagnosis not present

## 2014-08-29 DIAGNOSIS — C44320 Squamous cell carcinoma of skin of unspecified parts of face: Secondary | ICD-10-CM | POA: Diagnosis not present

## 2014-08-29 NOTE — Telephone Encounter (Signed)
Spoke with the pt  He is aware that we are going to work on precert for PET  He denied any other concerns  I will forward to [facility] to handle precert Thanks

## 2014-08-29 NOTE — Addendum Note (Signed)
Encounter addended by: Deirdre Evener, RN on: 08/29/2014  5:57 PM<BR>     Documentation filed: Charges VN

## 2014-08-29 NOTE — Telephone Encounter (Signed)
PCC's please advise.  Thank you.

## 2014-08-29 NOTE — Progress Notes (Signed)
Pt and wife state that he is not having surgery tomorrow. When asked if he has called Dr. Ritta Slot office, he stated that Dr. Isidore Moos was to call him. I called Dr. Ritta Slot office and left message that pt was cancelling, requested that someone call me back.

## 2014-08-29 NOTE — Addendum Note (Signed)
Encounter addended by: Deirdre Evener, RN on: 08/29/2014  2:42 PM<BR>     Documentation filed: [facility] Follow-up Actions, Flowsheet VN, Visit Diagnoses, Orders

## 2014-08-29 NOTE — Telephone Encounter (Signed)
[phone] patient calling back

## 2014-08-30 ENCOUNTER — Encounter (HOSPITAL_COMMUNITY): Admission: RE | Payer: Self-pay | Source: Ambulatory Visit

## 2014-08-30 ENCOUNTER — Telehealth: Payer: Self-pay | Admitting: *Deleted

## 2014-08-30 ENCOUNTER — Ambulatory Visit (HOSPITAL_COMMUNITY): Admission: RE | Admit: 2014-08-30 | Payer: Medicare Other | Source: Ambulatory Visit | Admitting: Dentistry

## 2014-08-30 SURGERY — MULTIPLE EXTRACTION WITH ALVEOLOPLASTY
Anesthesia: General

## 2014-08-30 MED ORDER — FLUOXETINE HCL 10 MG PO CAPS: 10.0000 mg | ORAL_CAPSULE | Freq: Every day | ORAL | Status: AC

## 2014-08-30 NOTE — Telephone Encounter (Signed)
Pt states the insurance will not cover the fluoxetine 10 tab not on drug formulary. Need change to fluoxetine hcl 10mg  caps. [facility]. Also he stated he doesn't need the remeron right now, but next year insurance will not cover. It will have to be change to either trazodone or B[place]a. Inform pt will notate...[name]

## 2014-08-30 NOTE — Telephone Encounter (Signed)
unc precert done oked per dr clance Joellen Jersey

## 2014-08-30 NOTE — Progress Notes (Signed)
Met with patient and his wife during initial consult with Dr. Isidore Moos. 1. Further introduced myself as their Navigator, explained my role as a member of the Care Team, provided contact information for myself and Dr. Isidore Moos, encouraged them to contact me with questions/concerns as treatments/procedures begin. 2. Provided New Patient Information packet:  Contact information for physician and navigator  Advance Directive information ([place] blue pamphlet)  Fall Prevention Patient Safety Plan  [facility] campus map with highlight of [place] 3. Provided introductory explanation of radiation treatment including SIM planning and fitting of head mask, showed them example of mask.   They verbalized understanding of information provided.   After appt, contacted Dr. Enrique Sack to cancel oral surgery per patient's wishes/Dr. Squire's concurrence.  I will schedule patient for next week's [place] for visits with dietician, SW, PT Lymphedema.  Gayleen Orem, RN, BSN, [place] at [place] [phone]

## 2014-08-30 NOTE — Telephone Encounter (Signed)
LMTCB Sally E Ottinger ° °

## 2014-09-03 ENCOUNTER — Telehealth: Payer: Self-pay | Admitting: Pulmonary Disease

## 2014-09-03 NOTE — Telephone Encounter (Signed)
Called spoke with pt. He reports he already spoke with [facility] and needed nothing further

## 2014-09-04 ENCOUNTER — Telehealth: Payer: Self-pay | Admitting: *Deleted

## 2014-09-04 NOTE — Telephone Encounter (Signed)
Spoke with patient regarding his attendance at tomorrow's H&N [place] with an arrival time of 12:00.  He verbalized understanding.  Gayleen Orem, RN, BSN, [place] at [place] [phone]

## 2014-09-05 ENCOUNTER — Ambulatory Visit: Payer: Medicare Other | Attending: Radiation Oncology | Admitting: Physical Therapy

## 2014-09-05 ENCOUNTER — Ambulatory Visit: Payer: Medicare Other | Admitting: Nutrition

## 2014-09-05 ENCOUNTER — Encounter: Payer: Self-pay | Admitting: *Deleted

## 2014-09-05 DIAGNOSIS — Z9049 Acquired absence of other specified parts of digestive tract: Secondary | ICD-10-CM | POA: Insufficient documentation

## 2014-09-05 DIAGNOSIS — R293 Abnormal posture: Secondary | ICD-10-CM

## 2014-09-05 DIAGNOSIS — Z8572 Personal history of non-Hodgkin lymphomas: Secondary | ICD-10-CM | POA: Insufficient documentation

## 2014-09-05 NOTE — Progress Notes (Signed)
Patient was seen during head and neck clinic.  73 year old male diagnosed with cancer of the left parotid gland.  He is a patient of Dr. Alvy Bimler and Dr. Isidore Moos.  Patient is to receive only radiation therapy.  Past medical history includes hypertension, anxiety, COPD, hyperlipidemia, GERD, Barrett's esophagus, PVD, CAD, tobacco, depression, MI, AAA, diabetes mellitus type II, and tobacco.  Medications include Xanax, Os-Cal, vitamin D, Prozac, Amaryl, magnesium, Remeron, multivitamin, and Restoril.  Labs include glucose 129, BUN 32, creatinine 2.03, albumin 3.3.  Height: 69 inches. Weight: 161.6 pounds October 29. Usual body weight: 169 pounds January 2015. BMI: 23.85.  Patient reports difficulty chewing secondary to poor fitting dentures.  He does not tolerate meat however he can chew fresh apples and other foods that are cut small.  He primarily eats soups, vegetables, eggs, cheese and ground hamburger.  Patient has been consuming 4 boost breeze daily.  Nutrition diagnosis: Unintended weight loss related to difficulty chewing and new diagnosis of cancer of the left parotid gland as evidenced by 4% weight loss from usual body weight.  Intervention: Patient educated to consume smaller, more frequent meals with adequate protein, and calories to minimize weight loss. Patient educated to continue boost breeze 4 times a day. Reviewed soft diet with patient. Fact sheets were provided.  Questions were answered.  Contact information was given.  Teach back method used.  Monitoring, evaluation, goals: Patient will tolerate adequate calories and protein to minimize further weight loss.  Next visit:To be scheduled with treatment.  **Disclaimer: This note was dictated with voice recognition software. Similar sounding words can inadvertently be transcribed and this note may contain transcription errors which may not have been corrected upon publication of note.**

## 2014-09-05 NOTE — Addendum Note (Signed)
Encounter addended by: Deirdre Evener, RN on: 09/05/2014 10:28 AM<BR>     Documentation filed: Notes Section

## 2014-09-05 NOTE — Progress Notes (Signed)
To provide support and encouragement, care continuity and to assess for needs, met with patient and his wife during H&N [place].  He did not express any concerns/questions, they understand they can contact me if that changes.  Gayleen Orem, RN, BSN, [place] at [place] [phone]

## 2014-09-06 ENCOUNTER — Encounter: Payer: Self-pay | Admitting: Physical Therapy

## 2014-09-06 DIAGNOSIS — R293 Abnormal posture: Secondary | ICD-10-CM | POA: Diagnosis not present

## 2014-09-06 NOTE — Therapy (Signed)
Physical Therapy Evaluation  Patient Details  Name: [name] MRN: [id number] Date of Birth: April 15, 1941  Encounter Date: 09/05/2014      PT End of Session - 09/06/14 1024    Visit Number 1   Number of Visits 1   PT Start Time 4315   PT Stop Time 1330   PT Time Calculation (min) 25 min      Past Medical History  Diagnosis Date  . Hypertension   . Anxiety   . COPD (chronic obstructive pulmonary disease)   . Hyperlipemia   . GERD (gastroesophageal reflux disease)   . Barrett's esophagus with high grade dysplasia     s/p RFA  . PVD (peripheral vascular disease)   . CAD (coronary artery disease)   . Tobacco user   . Depression   . Hiatal hernia   . Myocardial infarction 12/1996  . AAA (abdominal aortic aneurysm)   . Renal insufficiency 2010  . Bruises easily     on hands  . Pneumonia 06/22/14  . Type II diabetes mellitus   . 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 Cancer     PAROTID/LYMPH NODE CANCER LEFT SIDE    Past Surgical History  Procedure Laterality Date  . Tonsillectomy    . Nissen fundoplication    . Abdominal aortic aneurysm repair  2010    and right common iliac artery aneurysm, DR HAYES  . Upper gastrointestinal endoscopy  07/07/2011    barretts esophagus, hiatal hernia, gastritis, ablations in esophagus  . Colonoscopy  05/07/2005    diverticulosis, internal and external hemorrhoids  . Radiofrequency ablation  multiple    Barrett's, high-grade dysplasia  . Colonoscopy N/A 03/01/2013    Procedure: COLONOSCOPY;  Surgeon: Gatha Mayer, MD;  Location: [facility];  Service: Endoscopy;  Laterality: N/A;  . Coronary angioplasty with stent placement  1998    "1"  . Parotidectomy w/ neck dissection total Left 08/13/2014    PARTIAL NECK DISSECTION  . Hernia repair    . Parotidectomy Left 08/13/2014    Procedure: PAROTIDECTOMY WITH LIMITED LEFT NECK DISSECTION;  Surgeon: Rozetta Nunnery, MD;  Location: [facility];  Service: ENT;   Laterality: Left;    There were no vitals taken for this visit.  Visit Diagnosis:  Abnormal posture      Subjective Assessment - 09/06/14 0954    Pertinent History Pt. developed an enlarging mass in the parotid gland; biopsy showed squamous cell carcinoma; pt. with left parotid gland and left neck dissection.   Currently in Pain? No/denies          [facility] PT Assessment - 09/06/14 0958    Balance Screen   Has the patient fallen in the past 6 months No   Has the patient had a decrease in activity level because of a fear of falling?  No   Is the patient reluctant to leave their home because of a fear of falling?  No   Home Environment   Family/patient expects to be discharged to: Private residence   Living Arrangements Spouse/significant other   Type of Home Other(Comment)   Home Access --  townhome with no stairs   Observation/Other Assessments   Observations Neck tissue is currently soft to palpation   AROM   Cervical Flexion WFL   Cervical Extension 20% loss   Cervical - Right Side Bend 25% loss   Cervical - Left Side Bend 25% loss   Cervical -  Right Rotation WFL   Cervical - Left Rotation [facility]   Strength   Overall Strength Within functional limits for tasks performed   Ambulation/Gait   Ambulation/Gait Yes   Ambulation/Gait Assistance 7: Independent   Stairs Yes   Stairs Assistance Other (comment)  Pt.'s wife reports he avoids stairs since hospitalizations          [facility] Adult PT Treatment/Exercise - 10/02/14 0934    Modalities   Modalities --   Manual Therapy   Manual Therapy --          Education - 10-02-2014 1023    Education provided Yes   Education Details neck ROM, posture, walking, and lymphedema information   Education Details Patient;Spouse   Methods Explanation;Demonstration;Handout   Comprehension Verbalized understanding                G-Codes - 02-Oct-2014 1028    Functional Assessment Tool Used clinical judgement   Functional  Limitation Other PT primary   Other PT Primary Current Status (E3212) At least 1 percent but less than 20 percent impaired, limited or restricted   Other PT Primary Goal Status (Y4825) At least 1 percent but less than 20 percent impaired, limited or restricted   Other PT Primary Discharge Status (O0370) At least 1 percent but less than 20 percent impaired, limited or restricted      Problem List Patient Active Problem List   Diagnosis Date Noted  . Skin cancer of face 08/29/2014  . 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 Insomnia 07/10/2014  . Generalized anxiety disorder 07/10/2014  . Anorexia 05/16/2014  . Hyperkalemia 05/02/2014  . Acute renal insufficiency 04/29/2014  . PNA (pneumonia) 04/29/2014  . Abnormal LFTs 04/29/2014  . Acute bronchitis 04/04/2014  . Squamous cell carcinoma of skin of face 06/04/2013  . Tremors of nervous system 04/10/2013  . Normocytic anemia 03/27/2013  . Hypomagnesemia 03/24/2013  . Failure to thrive in adult 03/23/2013  . Hypocalcemia with severe weakness 03/23/2013  . Hypoglycemia 03/23/2013  . Protein-calorie malnutrition, severe 03/23/2013  . Orthostatic hypotension 03/18/2013  . Hypokalemia 03/16/2013  . Metabolic acidosis 48/88/9169  . Renal failure, acute on chronic 03/15/2013  . Pneumonia 03/14/2013  . Elevated WBC count 03/14/2013  . Subacute confusional state 03/14/2013  . Right Lung mass 03/14/2013  . Frequent falls 03/14/2013  . CKD (chronic kidney disease) stage 3, GFR 30-59 ml/min 03/14/2013  . Dehydration 03/14/2013  . Leukocytosis 03/14/2013  . Syncope 03/14/2013  . Transaminitis 03/14/2013  . Loss of weight 03/13/2013  . Falls frequently 03/13/2013  . Fever and chills 03/13/2013  . Chronic diarrhea 02/27/2013  . Lymphocytic colitis 10/25/2012  . Nausea alone 10/25/2012  . Actinic keratoses 10/07/2012  . Renal failure (ARF), acute on chronic 08/11/2012  . Elbow pain 04/12/2012  . DM (diabetes mellitus), type 2 with  peripheral vascular complications and renal complications, controlled 12/23/2011  . HTN CKD UNS W/CKD STAGE I THRU STAGE IV/UNS 12/22/2010  . Neoplasm of uncertain behavior of skin 09/19/2010  . CKD (chronic kidney disease) 01/22/2010  . HYPERKALEMIA 10/09/2009  . PERIPHERAL VASCULAR DISEASE 03/11/2009  . CHRONIC OBSTRUCTIVE PULMONARY DISEASE, ACUTE EXACERBATION 06/16/2008  . TOBACCO USE DISORDER/SMOKER-SMOKING CESSATION DISCUSSED 05/17/2008  . DEPRESSION 12/23/2007  . HYPERLIPIDEMIA 08/31/2007  . ANXIETY 08/31/2007  . INSOMNIA, PERSISTENT 08/31/2007  . CORONARY ARTERY DISEASE 08/31/2007  . AAA 08/31/2007  . COPD 08/31/2007  . GERD 08/31/2007  . 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 The patient was able to verbalize good understanding of each of these.         Head and Neck Clinic Goals - 09/06/14 1027    Patient will be able to verbalize understanding of a home exercise program for cervical range of motion, posture, and walking.    Status Achieved   Patient will be able to verbalize understanding of proper sitting and standing posture.    Status Achieved   Patient will be able to verbalize understanding of lymphedema risk and availability of treatment for this condition.    Status Achieved       Kaylenn Civil 09/06/2014, 10:33 AM

## 2014-09-07 ENCOUNTER — Encounter: Payer: Self-pay | Admitting: *Deleted

## 2014-09-07 ENCOUNTER — Encounter: Payer: Self-pay | Admitting: Pulmonary Disease

## 2014-09-07 ENCOUNTER — Ambulatory Visit (HOSPITAL_COMMUNITY)
Admission: RE | Admit: 2014-09-07 | Discharge: 2014-09-07 | Disposition: A | Discharge status: Home / Self Care | Payer: Medicare Other | Source: Ambulatory Visit | Attending: Pulmonary Disease | Admitting: Pulmonary Disease

## 2014-09-07 ENCOUNTER — Ambulatory Visit: Payer: Medicare Other | Admitting: Cardiovascular Disease

## 2014-09-07 DIAGNOSIS — R918 Other nonspecific abnormal finding of lung field: Secondary | ICD-10-CM

## 2014-09-07 DIAGNOSIS — I2510 Atherosclerotic heart disease of native coronary artery without angina pectoris: Secondary | ICD-10-CM | POA: Diagnosis not present

## 2014-09-07 DIAGNOSIS — I714 Abdominal aortic aneurysm, without rupture: Secondary | ICD-10-CM | POA: Diagnosis not present

## 2014-09-07 DIAGNOSIS — C07 Malignant neoplasm of parotid gland: Secondary | ICD-10-CM | POA: Diagnosis not present

## 2014-09-07 LAB — GLUCOSE, CAPILLARY: GLUCOSE-CAPILLARY: 122 mg/dL — AB (ref 70–99)

## 2014-09-07 MED ORDER — FLUDEOXYGLUCOSE F - 18 (FDG) INJECTION
8.2000 | Freq: Once | INTRAVENOUS | Status: AC | PRN
Start: 2014-09-07 — End: 2014-09-07
  Administered 2014-09-07: 8.2 via INTRAVENOUS

## 2014-09-07 NOTE — Progress Notes (Signed)
[place] Clinical Social Work  Clinical Social Work received follow call for my pt regarding distress screen dated 08/29/14. He shared his biggest stressor is whether or not the [place] will cover his radiation. He reports Ailene Ravel in [place] is working on this. Pt denied other concerns at this time and "is ready to get started" with his treatment. CSW explained role of Pt and Family Support center and how CSW team can assist. Pt aware and agrees to seek out help as needed. Pt reports he has a ride to treatment in the mornings.   ONCBCN DISTRESS SCREENING 08/29/2014  Screening Type Initial Screening  Distress experienced in past week (1-10) 8  Practical problem type Insurance  Emotional problem type Nervousness/Anxiety  Spiritual/Religous concerns type Loss of sense of purpose  Information Concerns Type Lack of info about complementary therapy choices  Physical Problem type Pain;Sleep/insomnia  Physician notified of physical symptoms Yes  Referral to clinical social work Yes   Clinical Social Work interventions: Education about Support Team  Loren Racer, [place]  [place] Phone: [phone] Fax: [phone]

## 2014-09-07 NOTE — Progress Notes (Signed)
[facility] Psychosocial Distress Screening Clinical Social Work  Clinical Social Work was referred by distress screening protocol.  The patient scored a 8 on the Psychosocial Distress Thermometer which indicates severe distress. Clinical Social Worker left vm for pt to assess for distress and other psychosocial needs. CSW awaits return call and will be available for future appointments.   ONCBCN DISTRESS SCREENING 08/29/2014  Screening Type Initial Screening  Distress experienced in past week (1-10) 8  Practical problem type Insurance  Emotional problem type Nervousness/Anxiety  Spiritual/Religous concerns type Loss of sense of purpose  Information Concerns Type Lack of info about complementary therapy choices  Physical Problem type Pain;Sleep/insomnia  Physician notified of physical symptoms Yes  Referral to clinical social work Yes    Clinical Social Worker follow up needed: No.  If yes, follow up plan: Loren Racer, [place]  [facility] [place] Phone: [phone] Fax: [phone]

## 2014-09-10 ENCOUNTER — Telehealth: Payer: Self-pay | Admitting: *Deleted

## 2014-09-10 NOTE — Telephone Encounter (Signed)
Per pt Email: To: Kathee Delton, MD     Subject: Non-Urgent Medical Question        I have competed my pet scan today at 18   please call me when you receive results      Please advise [place] thanks

## 2014-09-10 NOTE — Telephone Encounter (Signed)
   Result Note     Please let pt know that his PET shows further clearing of his lung abnormality. There is nothing in the chest on PET that is worrisome for cancer. This is great news. Does not need any further xrays   --  I spoke with patient about results and he verbalized understanding and had no questions

## 2014-09-12 ENCOUNTER — Encounter: Payer: Self-pay | Admitting: *Deleted

## 2014-09-12 ENCOUNTER — Ambulatory Visit
Admission: RE | Admit: 2014-09-12 | Discharge: 2014-09-12 | Disposition: A | Discharge status: Home / Self Care | Payer: Medicare Other | Source: Ambulatory Visit | Attending: Radiation Oncology | Admitting: Radiation Oncology

## 2014-09-12 DIAGNOSIS — C44300 Unspecified malignant neoplasm of skin of unspecified part of face: Secondary | ICD-10-CM

## 2014-09-12 DIAGNOSIS — Z510 Encounter for antineoplastic radiation therapy: Secondary | ICD-10-CM | POA: Diagnosis not present

## 2014-09-12 NOTE — Progress Notes (Signed)
[name] Psychosocial Distress Screening Clinical Social Work  Clinical Social Work was referred by distress screening protocol.  The patient scored a 8 on the Psychosocial Distress Thermometer which indicates severe distress. Clinical Social Worker met with patient and spouse after simulation appointment to assess for distress and other psychosocial needs. [name] reports he is eager to "get started" on treatment and now rates his distress "about a 4".  His main concern at this time is "getting through treatment".  [name] reports her distress a 6 and could not identify specific concerns at this time.  [name] both 4 children from previous marriages and 8 grandchildren.  CSW briefly explained CSW role and support services available at [facility].  CSW strongly encouraged patient/caregiver to follow up with CSW as needed.  ONCBCN DISTRESS SCREENING 08/29/2014  Screening Type Initial Screening  Distress experienced in past week (1-10) 8  Practical problem type Insurance  Emotional problem type Nervousness/Anxiety  Spiritual/Religous concerns type Loss of sense of purpose  Information Concerns Type Lack of info about complementary therapy choices  Physical Problem type Pain;Sleep/insomnia  Physician notified of physical symptoms Yes  Referral to clinical social work Yes    Clinical Social Worker follow up needed: No.  If yes, follow up plan:   Polo Riley, MSW, LCSW, OSW-C Clinical Social Worker [place] [phone]

## 2014-09-12 NOTE — Progress Notes (Signed)
Simulation, IMRT treatment planning  note   outpatient  Diagnosis:    ICD-9-CM ICD-10-CM   1. Skin cancer of face 173.31 C44.310      The patient was taken to the CT simulator and laid in the supine position on the table. An Aquaplast head and shoulder mask was custom fitted to the patient's anatomy. High-resolution CT axial imaging was obtained of the head and neck with contrast. I verified that the quality of the imaging is good for treatment planning. 1 Medically Necessary Treatment Device was fabricated and supervised by me: Aquaplast mask.   Treatment planning note I plan to treat the patient with helical Tomotherapy, IMRT. I plan to treat the patient's left tumor bed and ipsilateral neck nodes. I plan to treat to a total dose of 66 Gray in 33  Fractions due to positive margins. Dose calculation was ordered from dosimetry.  IMRT planning Note  IMRT is an important modality to deliver adequate dose to the patient's at risk tissues while sparing the patient's normal structures, including the: esophagus, parotid tissue, mandible, brain stem, spinal cord, oral cavity, brachial plexus.  This justifies the use of IMRT in the patient's treatment.    -----------------------------------  Eppie Gibson, MD

## 2014-09-18 ENCOUNTER — Telehealth: Payer: Self-pay | Admitting: *Deleted

## 2014-09-18 ENCOUNTER — Telehealth: Payer: Self-pay | Admitting: Internal Medicine

## 2014-09-18 NOTE — Telephone Encounter (Signed)
Is Halcion less expensive? Thx

## 2014-09-18 NOTE — Telephone Encounter (Signed)
Contacted patient to inquire if he is willing to have an RT this Sunday, 11/22, since [facility] closed Thanksgiving Thursday and Friday.  He agreed.  I notified Dr. Isidore Moos and Tomo.  Gayleen Orem, RN, BSN, [place] at [place] [phone]

## 2014-09-18 NOTE — Telephone Encounter (Signed)
Pt called in said that he can not afford the temazepam (RESTORIL) 15 MG capsule [[id number]]   It is a teir 3.  He wanted to know if there was anything cheaper?

## 2014-09-19 DIAGNOSIS — Z510 Encounter for antineoplastic radiation therapy: Secondary | ICD-10-CM | POA: Diagnosis not present

## 2014-09-19 MED ORDER — SUVOREXANT 20 MG PO TABS: 20.0000 mg | ORAL_TABLET | Freq: Every evening | ORAL | Status: DC | PRN

## 2014-09-19 NOTE — Telephone Encounter (Signed)
Ok Belsomra Thx

## 2014-09-19 NOTE — Telephone Encounter (Signed)
Pt stated the only two that he can get in [place] is trazodone or Belsoma.  [facility]

## 2014-09-20 NOTE — Telephone Encounter (Signed)
Notified pt with md response. [facility].../lmb 

## 2014-09-21 ENCOUNTER — Telehealth: Payer: Self-pay | Admitting: Radiation Oncology

## 2014-09-21 DIAGNOSIS — Z510 Encounter for antineoplastic radiation therapy: Secondary | ICD-10-CM | POA: Diagnosis not present

## 2014-09-21 NOTE — Telephone Encounter (Signed)
Spoke to Butch Penny at the [employer] who needed to confirm a patient appt (11/22) and our address. Her phone# [phone].

## 2014-09-23 ENCOUNTER — Ambulatory Visit
Admission: RE | Admit: 2014-09-23 | Discharge: 2014-09-23 | Disposition: A | Discharge status: Home / Self Care | Payer: Medicare Other | Source: Ambulatory Visit | Attending: Radiation Oncology | Admitting: Radiation Oncology

## 2014-09-23 ENCOUNTER — Ambulatory Visit: Payer: Medicare Other | Admitting: Radiation Oncology

## 2014-09-23 DIAGNOSIS — C44300 Unspecified malignant neoplasm of skin of unspecified part of face: Secondary | ICD-10-CM

## 2014-09-23 DIAGNOSIS — Z510 Encounter for antineoplastic radiation therapy: Secondary | ICD-10-CM | POA: Diagnosis not present

## 2014-09-24 ENCOUNTER — Ambulatory Visit: Payer: Medicare Other | Admitting: Radiation Oncology

## 2014-09-24 ENCOUNTER — Ambulatory Visit
Admission: RE | Admit: 2014-09-24 | Discharge: 2014-09-24 | Disposition: A | Discharge status: Home / Self Care | Payer: Medicare Other | Source: Ambulatory Visit | Attending: Radiation Oncology | Admitting: Radiation Oncology

## 2014-09-24 ENCOUNTER — Ambulatory Visit: Payer: Medicare Other

## 2014-09-24 ENCOUNTER — Encounter: Payer: Self-pay | Admitting: Radiation Oncology

## 2014-09-24 ENCOUNTER — Encounter: Payer: Self-pay | Admitting: Pulmonary Disease

## 2014-09-24 VITALS — BP 168/85 | HR 71 | Temp 97.6°F | Resp 10 | Wt 166.7 lb

## 2014-09-24 DIAGNOSIS — C44310 Basal cell carcinoma of skin of unspecified parts of face: Secondary | ICD-10-CM | POA: Diagnosis not present

## 2014-09-24 DIAGNOSIS — R220 Localized swelling, mass and lump, head: Secondary | ICD-10-CM | POA: Diagnosis not present

## 2014-09-24 DIAGNOSIS — Z510 Encounter for antineoplastic radiation therapy: Secondary | ICD-10-CM | POA: Insufficient documentation

## 2014-09-24 DIAGNOSIS — C07 Malignant neoplasm of parotid gland: Secondary | ICD-10-CM

## 2014-09-24 DIAGNOSIS — C44300 Unspecified malignant neoplasm of skin of unspecified part of face: Secondary | ICD-10-CM

## 2014-09-24 MED ORDER — BIAFINE EX EMUL
Freq: Two times a day (BID) | CUTANEOUS | Status: DC
  Administered 2014-09-24: 13:00:00 via TOPICAL

## 2014-09-24 NOTE — Addendum Note (Signed)
Encounter addended by: Maxine Fredman E Amay Mijangos, RN on: 09/24/2014  1:26 PM<BR>     Documentation filed: Inpatient MAR

## 2014-09-24 NOTE — Telephone Encounter (Signed)
We have printed note off for chantel to review

## 2014-09-24 NOTE — Addendum Note (Signed)
Encounter addended by: Jenene Slicker, RN on: 09/24/2014  1:24 PM<BR>     Documentation filed: Visit Diagnoses, Dx Association, Orders

## 2014-09-24 NOTE — Progress Notes (Signed)
He is currently in no pain. Pt complains of, Chills and Fatigue.  Pt denies dysphagia. PO Diet: Regular, reports drinking 4-5 boost a day. Oral exam reveals mucous membranes moist, pharynx normal without lesions and erythematous, reports having a dry mouth. Skin warm dry and intact.

## 2014-09-24 NOTE — Progress Notes (Signed)
   Weekly Management Note:  outpatient    ICD-9-CM ICD-10-CM   1. Skin cancer of face 173.31 C44.310     Current Dose:  4 Gy  Projected Dose: 66 Gy   Narrative:  The patient presents for routine under treatment assessment.  CBCT/MVCT images/Port film x-rays were reviewed.  The chart was checked. He is currently in no pain. Pt denies dysphagia. PO Diet: Regular, reports drinking 4-5 boost a day. Dr. Lucia Gaskins called me last week to report a new nodule in the inferior aspect of the left parotid bed.  Dr. Lucia Gaskins favored proceeding with RT, with surgery if needed for salvaged.  Physical Findings:  weight is 166 lb 11.2 oz (75.615 kg). His oral temperature is 97.6 F (36.4 C). His blood pressure is 168/85 and his pulse is 71. His respiration is 10 and oxygen saturation is 100%.  Soft ~1.5 cm mobile rounded mass in the inferior aspect of the left parotid bed. Suspect lymph node. Oropharyngeal mucosa is intact with no thrush or lesions.   skin intact and smooth over neck.    Impression:  The patient is tolerating radiotherapy.  Plan:  Continue radiotherapy as planned. I reviewed his tomo plan. Nodule appears is in the 66 Gy region. If this doesn't regress following RT, consider bx/surgery. I favor continuing RT, to spare pt from repercussions of delaying therapy or morbidly of more surgery.  ________________________________   Eppie Gibson, M.D.

## 2014-09-24 NOTE — Progress Notes (Signed)
Pt here post PUT appointment for Pt teaching.  Pt given Radiation and You booklet with areas of pertinence flagged.  Reviewed fatigue, hair loss, mouth, throat and skin changes.  As well as nutritional suggestions such as soft, easy to eat, high protein/calorie foods.   Skin care suggestions provided, such as using unscented soap, avoid shaving with a straight razor, to use electric instead, avoid allow direct shower spray to treatment site. In addition, suggested use of a humidifier. Pt given Biafine cream with instructions to use bid and to avoid applying any products to site within 4 hours of treatment.   Pt able to give teach back of frequency to apply Biafine, to shave with an electric razor, to avoid scratchy spicy foods.  Pt reports they feel comfortable with information and will contact nursing if any questions or concerns.

## 2014-09-24 NOTE — Addendum Note (Signed)
Encounter addended by: Jenene Slicker, RN on: 09/24/2014 10:48 AM<BR>     Documentation filed: Notes Section

## 2014-09-24 NOTE — Progress Notes (Signed)
IMRT Device Note    ICD-9-CM ICD-10-CM   1. Skin cancer of face 173.31 C44.310     10.1 delivered field widths represent one set of IMRT treatment devices. The code is [phone].  -----------------------------------  Eppie Gibson, MD

## 2014-09-25 ENCOUNTER — Ambulatory Visit
Admission: RE | Admit: 2014-09-25 | Discharge: 2014-09-25 | Disposition: A | Discharge status: Home / Self Care | Payer: Medicare Other | Source: Ambulatory Visit | Attending: Radiation Oncology | Admitting: Radiation Oncology

## 2014-09-25 ENCOUNTER — Ambulatory Visit: Payer: Medicare Other

## 2014-09-25 DIAGNOSIS — Z510 Encounter for antineoplastic radiation therapy: Secondary | ICD-10-CM | POA: Diagnosis not present

## 2014-09-26 ENCOUNTER — Ambulatory Visit: Payer: Medicare Other

## 2014-09-26 ENCOUNTER — Ambulatory Visit
Admission: RE | Admit: 2014-09-26 | Discharge: 2014-09-26 | Disposition: A | Discharge status: Home / Self Care | Payer: Medicare Other | Source: Ambulatory Visit | Attending: Radiation Oncology | Admitting: Radiation Oncology

## 2014-09-26 DIAGNOSIS — Z510 Encounter for antineoplastic radiation therapy: Secondary | ICD-10-CM | POA: Diagnosis not present

## 2014-09-28 ENCOUNTER — Ambulatory Visit: Payer: Medicare Other

## 2014-10-01 ENCOUNTER — Ambulatory Visit
Admission: RE | Admit: 2014-10-01 | Discharge: 2014-10-01 | Disposition: A | Discharge status: Home / Self Care | Payer: Medicare Other | Source: Ambulatory Visit | Attending: Radiation Oncology | Admitting: Radiation Oncology

## 2014-10-01 ENCOUNTER — Ambulatory Visit
Admission: RE | Admit: 2014-10-01 | Discharge: 2014-10-01 | Disposition: A | Discharge status: Home / Self Care | Payer: Non-veteran care | Source: Ambulatory Visit | Attending: Radiation Oncology | Admitting: Radiation Oncology

## 2014-10-01 ENCOUNTER — Ambulatory Visit: Payer: Medicare Other

## 2014-10-01 ENCOUNTER — Encounter: Payer: Self-pay | Admitting: Radiation Oncology

## 2014-10-01 DIAGNOSIS — Z510 Encounter for antineoplastic radiation therapy: Secondary | ICD-10-CM | POA: Diagnosis not present

## 2014-10-01 DIAGNOSIS — C44300 Unspecified malignant neoplasm of skin of unspecified part of face: Secondary | ICD-10-CM

## 2014-10-01 NOTE — Progress Notes (Signed)
   Weekly Management Note:  outpatient    ICD-9-CM ICD-10-CM   1. Skin cancer of face 173.31 C44.310     Current Dose: 10 Gy  Projected Dose: 66 Gy   Narrative:  The patient presents for routine under treatment assessment.  CBCT/MVCT images/Port film x-rays were reviewed.  The chart was checked. [name] has received 5 fractions to his left neck.  He reports left intermittent earache at a level 9/10 which started after surgery  Physical Findings:  height is 5\' 9"  (1.753 m) and weight is 165 lb 1.6 oz (74.889 kg). His temperature is 98.1 F (36.7 C). His blood pressure is 187/82 and his pulse is 67.  two 1.0cm - 1.5cm mobile rounded mass in the inferior aspect of the left parotid bed. Suspect 2 lymph nodes. Oropharyngeal mucosa is intact with no thrush or lesions.  Left tympanic membrane exam irregular - erythema, ?fluid;  skin intact and smooth over neck.    Impression:  The patient is tolerating radiotherapy.  Plan:  Continue radiotherapy as planned. Now has two masses in left parotid bed. Concerning for rapid progression after surgery.  I spoke with Dr Lucia Gaskins and another ENT surgeon from a [place] about his situation with the suspicious for progress. Consensus is to continue RT, save surgery for salvage.  Patient will call Dr. Lucia Gaskins to schedule appt for earache if it continues to bother him. ________________________________   Eppie Gibson, M.D.

## 2014-10-01 NOTE — Progress Notes (Signed)
[name] has received 5 fractions to his left neck.  He reports sore throat today.  Note marked dryness of oral cavity, but mucosa is intact.  He reports left intermittent earache at a level 9/10 when it occurs.

## 2014-10-02 ENCOUNTER — Ambulatory Visit: Payer: Medicare Other

## 2014-10-02 ENCOUNTER — Ambulatory Visit: Payer: Non-veteran care | Admitting: Nutrition

## 2014-10-02 ENCOUNTER — Telehealth: Payer: Self-pay | Admitting: *Deleted

## 2014-10-02 ENCOUNTER — Ambulatory Visit
Admission: RE | Admit: 2014-10-02 | Discharge: 2014-10-02 | Disposition: A | Discharge status: Home / Self Care | Payer: Medicare Other | Source: Ambulatory Visit | Attending: Radiation Oncology | Admitting: Radiation Oncology

## 2014-10-02 DIAGNOSIS — Z510 Encounter for antineoplastic radiation therapy: Secondary | ICD-10-CM | POA: Diagnosis not present

## 2014-10-02 NOTE — Telephone Encounter (Signed)
Called patient to encourage him to call Dr. Lucia Gaskins if his earache persists.  LVM, asked him to call me with any questions.  Gayleen Orem, RN, BSN, [place] at [place] [phone]

## 2014-10-02 NOTE — Progress Notes (Signed)
Nutrition follow up completed with patient and wife after radiation therapy. 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 Weight documented as 165 pounds, which is increased from 161.6 pounds October 29. Patient reports he is eating smaller amounts but more often.  He continues to eat fruits, vegetables, eggs, cheese and consumes at least 4 boost breeze daily. Patient denies nutrition side effects at this time.  Nutrition diagnosis: Unintended weight loss improved.  Intervention:  Patient was educated to continue small frequent meals with adequate protein and calories to minimize weight loss. Continue boost breeze 4 times a day. Teach back method used.  Monitoring, evaluation, goals: Patient will continue to tolerate adequate calories and protein for minimal weight loss.  Next visit:Tuesday, December 8 after radiation therapy.  **Disclaimer: This note was dictated with voice recognition software. Similar sounding words can inadvertently be transcribed and this note may contain transcription errors which may not have been corrected upon publication of note.**

## 2014-10-03 ENCOUNTER — Ambulatory Visit: Payer: Medicare Other

## 2014-10-03 ENCOUNTER — Ambulatory Visit
Admission: RE | Admit: 2014-10-03 | Discharge: 2014-10-03 | Disposition: A | Discharge status: Home / Self Care | Payer: Medicare Other | Source: Ambulatory Visit | Attending: Radiation Oncology | Admitting: Radiation Oncology

## 2014-10-03 DIAGNOSIS — Z510 Encounter for antineoplastic radiation therapy: Secondary | ICD-10-CM | POA: Diagnosis not present

## 2014-10-04 ENCOUNTER — Ambulatory Visit
Admission: RE | Admit: 2014-10-04 | Discharge: 2014-10-04 | Disposition: A | Discharge status: Home / Self Care | Payer: Medicare Other | Source: Ambulatory Visit | Attending: Radiation Oncology | Admitting: Radiation Oncology

## 2014-10-04 ENCOUNTER — Ambulatory Visit: Payer: Medicare Other

## 2014-10-04 DIAGNOSIS — Z510 Encounter for antineoplastic radiation therapy: Secondary | ICD-10-CM | POA: Diagnosis not present

## 2014-10-05 ENCOUNTER — Ambulatory Visit: Payer: Medicare Other

## 2014-10-05 ENCOUNTER — Ambulatory Visit
Admission: RE | Admit: 2014-10-05 | Discharge: 2014-10-05 | Disposition: A | Discharge status: Home / Self Care | Payer: Medicare Other | Source: Ambulatory Visit | Attending: Radiation Oncology | Admitting: Radiation Oncology

## 2014-10-05 DIAGNOSIS — Z510 Encounter for antineoplastic radiation therapy: Secondary | ICD-10-CM | POA: Diagnosis not present

## 2014-10-08 ENCOUNTER — Ambulatory Visit
Admission: RE | Admit: 2014-10-08 | Discharge: 2014-10-08 | Disposition: A | Discharge status: Home / Self Care | Payer: Medicare Other | Source: Ambulatory Visit | Attending: Radiation Oncology | Admitting: Radiation Oncology

## 2014-10-08 ENCOUNTER — Encounter: Payer: Self-pay | Admitting: Radiation Oncology

## 2014-10-08 ENCOUNTER — Ambulatory Visit: Payer: Medicare Other

## 2014-10-08 VITALS — BP 125/78 | HR 68 | Temp 98.0°F | Resp 12 | Wt 166.3 lb

## 2014-10-08 DIAGNOSIS — Z510 Encounter for antineoplastic radiation therapy: Secondary | ICD-10-CM | POA: Diagnosis not present

## 2014-10-08 DIAGNOSIS — C44320 Squamous cell carcinoma of skin of unspecified parts of face: Secondary | ICD-10-CM

## 2014-10-08 MED ORDER — LIDOCAINE VISCOUS 2 % MT SOLN: OROMUCOSAL | Status: DC

## 2014-10-08 NOTE — Progress Notes (Signed)
He is currently in no pain. Pt complains of sore throat, Fatigue and Generalized Weakness.  Pt denies dysphagia. PO Diet: Regular,soft and 6  Cans of boost. Oral exam reveals dry pink tonue. Skin is warm dry and intact.  Pt reports having soft bm everyday.

## 2014-10-08 NOTE — Progress Notes (Signed)
   Weekly Management Note:  outpatient    ICD-9-CM ICD-10-CM   1. Squamous cell carcinoma of skin of face 173.32 C44.320 lidocaine (XYLOCAINE) 2 % solution    Current Dose: 20 Gy  Projected Dose: 66 Gy   Narrative:  The patient presents for routine under treatment assessment.  CBCT/MVCT images/Port film x-rays were reviewed.  The chart was checked.He is currently in no pain. Pt complains of sore throat, Fatigue and Generalized Weakness.  Pt denies dysphagia. PO Diet: Regular,soft and 6  Cans of boost.   Pt reports having soft bm everyday. Nodules in left parotid region regressing.  Otalgia resolved.   Physical Findings:  weight is 166 lb 4.8 oz (75.433 kg). His oral temperature is 98 F (36.7 C). His blood pressure is 125/78 and his pulse is 68. His respiration is 12 and oxygen saturation is 100%.  Rounded masses in the inferior aspect of the left parotid bed have significantly regressed.   Oropharyngeal mucosa is erythematous and moist.  Impression:  The patient is tolerating radiotherapy; masses shrinking.  Plan:  Lidocaine Rx for odynophagia.  ________________________________   Eppie Gibson, M.D.

## 2014-10-09 ENCOUNTER — Ambulatory Visit: Payer: Medicare Other

## 2014-10-09 ENCOUNTER — Ambulatory Visit: Payer: Medicare Other | Admitting: Internal Medicine

## 2014-10-09 ENCOUNTER — Ambulatory Visit: Payer: Non-veteran care | Admitting: Nutrition

## 2014-10-09 ENCOUNTER — Ambulatory Visit
Admission: RE | Admit: 2014-10-09 | Discharge: 2014-10-09 | Disposition: A | Discharge status: Home / Self Care | Payer: Medicare Other | Source: Ambulatory Visit | Attending: Radiation Oncology | Admitting: Radiation Oncology

## 2014-10-09 DIAGNOSIS — Z510 Encounter for antineoplastic radiation therapy: Secondary | ICD-10-CM | POA: Diagnosis not present

## 2014-10-09 NOTE — Progress Notes (Signed)
Nutrition followup completed with patient and wife. 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 Weight has increased and was documented as 166.3 pounds December 7.  Increased from 165 pounds.   Patient reports he can eat anything that he wants. He is drinking 4-5 cans of boost breeze daily. Patient reports he doesn't drink much water.  Nutrition diagnosis: Unintended weight loss improved.  Intervention: Patient was educated to continue small frequent meals with adequate protein calories to minimize weight loss. Continue boost Breeze as needed. Recommended patient consume 2-16 ounce bottles of water daily in addition to other foods and liquids. Questions were answered.  Teach back method used.  Monitoring, evaluation, goals: Patient will continue to tolerate adequate calories and protein for minimal weight loss.  Next visit: Tuesday, December 15, after radiation therapy.  **Disclaimer: This note was dictated with voice recognition software. Similar sounding words can inadvertently be transcribed and this note may contain transcription errors which may not have been corrected upon publication of note.**

## 2014-10-10 ENCOUNTER — Other Ambulatory Visit (INDEPENDENT_AMBULATORY_CARE_PROVIDER_SITE_OTHER): Payer: Medicare Other

## 2014-10-10 ENCOUNTER — Encounter: Payer: Self-pay | Admitting: Internal Medicine

## 2014-10-10 ENCOUNTER — Ambulatory Visit (INDEPENDENT_AMBULATORY_CARE_PROVIDER_SITE_OTHER): Payer: Medicare Other | Admitting: Internal Medicine

## 2014-10-10 ENCOUNTER — Ambulatory Visit: Payer: Medicare Other

## 2014-10-10 ENCOUNTER — Ambulatory Visit
Admission: RE | Admit: 2014-10-10 | Discharge: 2014-10-10 | Disposition: A | Discharge status: Home / Self Care | Payer: Medicare Other | Source: Ambulatory Visit | Attending: Radiation Oncology | Admitting: Radiation Oncology

## 2014-10-10 VITALS — BP 130/76 | HR 64 | Temp 97.4°F | Ht 69.0 in | Wt 165.8 lb

## 2014-10-10 DIAGNOSIS — E1159 Type 2 diabetes mellitus with other circulatory complications: Secondary | ICD-10-CM | POA: Diagnosis not present

## 2014-10-10 DIAGNOSIS — E1151 Type 2 diabetes mellitus with diabetic peripheral angiopathy without gangrene: Secondary | ICD-10-CM

## 2014-10-10 DIAGNOSIS — Z510 Encounter for antineoplastic radiation therapy: Secondary | ICD-10-CM | POA: Diagnosis not present

## 2014-10-10 DIAGNOSIS — I2510 Atherosclerotic heart disease of native coronary artery without angina pectoris: Secondary | ICD-10-CM

## 2014-10-10 DIAGNOSIS — K22711 Barrett's esophagus with high grade dysplasia: Secondary | ICD-10-CM

## 2014-10-10 DIAGNOSIS — J439 Emphysema, unspecified: Secondary | ICD-10-CM

## 2014-10-10 DIAGNOSIS — C07 Malignant neoplasm of parotid gland: Secondary | ICD-10-CM

## 2014-10-10 LAB — BASIC METABOLIC PANEL
BUN: 46 mg/dL — AB (ref 6–23)
CALCIUM: 9.1 mg/dL (ref 8.4–10.5)
CO2: 20 mEq/L (ref 19–32)
Chloride: 109 mEq/L (ref 96–112)
Creatinine, Ser: 2.3 mg/dL — ABNORMAL HIGH (ref 0.4–1.5)
GFR: 29.89 mL/min — AB (ref >60.00)
Glucose, Bld: 83 mg/dL (ref 70–99)
Potassium: 4.1 mEq/L (ref 3.5–5.1)
Sodium: 137 mEq/L (ref 135–145)

## 2014-10-10 LAB — HEMOGLOBIN A1C: HEMOGLOBIN A1C: 6 % (ref 4.6–6.5)

## 2014-10-10 NOTE — Assessment & Plan Note (Signed)
Continue with current prescription therapy as reflected on the Med list.  

## 2014-10-10 NOTE — Assessment & Plan Note (Signed)
On XRT

## 2014-10-10 NOTE — Assessment & Plan Note (Signed)
Continue with current [facility] prescription therapy as reflected on the Med list.

## 2014-10-10 NOTE — Progress Notes (Signed)
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 He quit smoking in 7/15   HPI   F/u:  Renal failure - CRF COPD  DM (diabetes mellitus), type 2 with peripheral vascular complications  Normocytic anemia  Tremors of nervous system  Iron deficiency anemia  Skin Ca The patient also presents for a follow-up of  chronic hypertension, chronic dyslipidemia, type 2 diabetes, COPD, Barrett's, CAD controlled with medicines. He had Barrett's ablation x4, one more is due.     Review of Systems  Constitutional: Positive for unexpected weight change. Negative for appetite change and fatigue.  HENT: Negative for congestion, nosebleeds, sneezing and trouble swallowing.   Eyes: Negative for itching and visual disturbance.  Cardiovascular: Negative for palpitations and leg swelling.  Gastrointestinal: Positive for diarrhea. Negative for nausea, vomiting, constipation, blood in stool, abdominal distention and rectal pain.  Genitourinary: Negative for frequency and hematuria.  Musculoskeletal: Negative for back pain, joint swelling, gait problem and neck pain.  Neurological: Negative for dizziness, tremors, speech difficulty and weakness.  Psychiatric/Behavioral: Negative for sleep disturbance, dysphoric mood and agitation. The patient is not nervous/anxious.    Wt Readings from Last 3 Encounters:  10/10/14 165 lb 12.8 oz (75.206 kg)  10/08/14 166 lb 4.8 oz (75.433 kg)  10/01/14 165 lb 1.6 oz (74.889 kg)   BP Readings from Last 3 Encounters:  10/10/14 130/76  10/08/14 125/78  10/01/14 187/82        Objective:   Physical Exam  Constitutional: He is oriented to person, place, and time. He appears well-developed. No distress.  NAD  Eyes: Conjunctivae are normal. Pupils are equal, round, and reactive to light.  Neck: Normal range of motion. No JVD present. No thyromegaly present.  Cardiovascular: Normal rate, regular rhythm, normal  heart sounds and intact distal pulses.  Exam reveals no gallop and no friction rub.   No murmur heard. Pulmonary/Chest: Effort normal and breath sounds normal. No respiratory distress. He has no wheezes. He has no rales. He exhibits no tenderness.  Abdominal: Soft. Bowel sounds are normal. He exhibits no distension and no mass. There is no tenderness. There is no rebound and no guarding.  Musculoskeletal: Normal range of motion. He exhibits no edema or tenderness.  Lymphadenopathy:    He has no cervical adenopathy.  Neurological: He is alert and oriented to person, place, and time. He has normal reflexes. No cranial nerve deficit. He exhibits normal muscle tone. He displays a negative Romberg sign. Coordination and gait normal.  No meningeal signs  Skin: Skin is warm and dry. No rash noted.  Psychiatric: He has a normal mood and affect. His behavior is normal. Judgment and thought content normal.  AKs Not dyspneic Dry mouth    Lab Results  Component Value Date   WBC 9.9 08/09/2014   HGB 10.2* 08/09/2014   HCT 32.2* 08/09/2014   PLT 289 08/09/2014   GLUCOSE 136* 08/09/2014   CHOL 96 05/09/2014   TRIG 183.0* 05/09/2014   HDL 9.80* 05/09/2014   LDLDIRECT 80.2 08/11/2012   LDLCALC 50 05/09/2014   ALT 47 08/09/2014   AST 31 08/09/2014   NA 142 08/09/2014   K 4.5 08/09/2014   CL 109 08/09/2014   CREATININE 2.03* 08/09/2014   BUN 32* 08/09/2014   CO2 19 08/09/2014   TSH 2.00 02/06/2014   PSA 0.99 04/17/2010   INR 0.99 08/01/2014   HGBA1C 5.8 07/10/2014  Assessment & Plan:

## 2014-10-11 ENCOUNTER — Ambulatory Visit: Payer: Medicare Other

## 2014-10-11 ENCOUNTER — Ambulatory Visit
Admission: RE | Admit: 2014-10-11 | Discharge: 2014-10-11 | Disposition: A | Discharge status: Home / Self Care | Payer: Medicare Other | Source: Ambulatory Visit | Attending: Radiation Oncology | Admitting: Radiation Oncology

## 2014-10-11 ENCOUNTER — Encounter: Payer: Self-pay | Admitting: *Deleted

## 2014-10-11 DIAGNOSIS — Z510 Encounter for antineoplastic radiation therapy: Secondary | ICD-10-CM | POA: Diagnosis not present

## 2014-10-11 NOTE — Progress Notes (Signed)
To provide support and encouragement, care continuity and to assess for needs, met with patient after Tomo tmt.  He reported increasing   throat soreness that resolves with PRN lidocaine rinses; he understands to rinse before meals to maximize oral intake.  He denied other concerns/needs, understands he can contact me should that change.  Gayleen Orem, RN, BSN, [place] at [place] [phone]

## 2014-10-12 ENCOUNTER — Ambulatory Visit
Admission: RE | Admit: 2014-10-12 | Discharge: 2014-10-12 | Disposition: A | Discharge status: Home / Self Care | Payer: Medicare Other | Source: Ambulatory Visit | Attending: Radiation Oncology | Admitting: Radiation Oncology

## 2014-10-12 ENCOUNTER — Ambulatory Visit: Payer: Medicare Other

## 2014-10-12 DIAGNOSIS — Z510 Encounter for antineoplastic radiation therapy: Secondary | ICD-10-CM | POA: Diagnosis not present

## 2014-10-14 NOTE — Progress Notes (Addendum)
Duplicate entry

## 2014-10-15 ENCOUNTER — Ambulatory Visit: Payer: Medicare Other

## 2014-10-15 ENCOUNTER — Encounter: Payer: Self-pay | Admitting: Radiation Oncology

## 2014-10-15 ENCOUNTER — Ambulatory Visit
Admission: RE | Admit: 2014-10-15 | Discharge: 2014-10-15 | Disposition: A | Discharge status: Home / Self Care | Payer: Medicare Other | Source: Ambulatory Visit | Attending: Radiation Oncology | Admitting: Radiation Oncology

## 2014-10-15 VITALS — BP 136/90 | HR 65 | Temp 97.5°F | Resp 12 | Wt 161.4 lb

## 2014-10-15 DIAGNOSIS — Z510 Encounter for antineoplastic radiation therapy: Secondary | ICD-10-CM | POA: Diagnosis not present

## 2014-10-15 DIAGNOSIS — C44300 Unspecified malignant neoplasm of skin of unspecified part of face: Secondary | ICD-10-CM

## 2014-10-15 DIAGNOSIS — C44320 Squamous cell carcinoma of skin of unspecified parts of face: Secondary | ICD-10-CM

## 2014-10-15 MED ORDER — DOCUSATE SODIUM 100 MG PO CAPS: 100.0000 mg | ORAL_CAPSULE | Freq: Two times a day (BID) | ORAL | Status: DC | PRN

## 2014-10-15 MED ORDER — HYDROCODONE-ACETAMINOPHEN 5-325 MG PO TABS: 1.0000 | ORAL_TABLET | ORAL | Status: DC | PRN

## 2014-10-15 NOTE — Progress Notes (Signed)
   Weekly Management Note:  outpatient    ICD-9-CM ICD-10-CM   1. 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 Skin cancer of face 173.31 C44.310     Current Dose: 30 Gy  Projected Dose: 66 Gy   Narrative:  The patient presents for routine under treatment assessment.  CBCT/MVCT images/Port film x-rays were reviewed.  The chart was checked.   Pt complains of sore throat, Fatigue and Poor Appetite. Has lost approximately 4 lbs.  Pt reports drinking 4 boost breeze a day and plenty of H2O  Pt has had dysphagia for solids. Able to eat soft foods.  PO Diet: Regular,soft.  Skin is dry and pt reports pruritus.  Pt reports, a bowel movement every day.   Physical Findings:  weight is 161 lb 6.4 oz (73.211 kg). His oral temperature is 97.5 F (36.4 C). His blood pressure is 136/90 and his pulse is 65. His respiration is 12 and oxygen saturation is 98%.    Notable for dry skin over left neck, intact.  No new neck masses.  Impression:  The patient is tolerating radiotherapy  Plan:  Lidocaine Rx for odynophagia is helping, but will add hydrocodone prn pain and colace to soften stool while on narcotics.  Continue following w/ nutrition, push PO.  ________________________________   Eppie Gibson, M.D.

## 2014-10-15 NOTE — Progress Notes (Signed)
He is currently in no pain. Pt complains of sore throat, Fatigue and Poor Appetite. Has lost approximately 4 lbs.  Pt reports drinking 4 boost breeze a day. Pt has had dysphagia for solids. PO Diet: Regular,soft. Oral exam reveals dry, cracked tongue. Skin is dry and pt reports pruritus.  Pt reports, a bowel movement every day.

## 2014-10-16 ENCOUNTER — Ambulatory Visit
Admission: RE | Admit: 2014-10-16 | Discharge: 2014-10-16 | Disposition: A | Discharge status: Home / Self Care | Payer: Medicare Other | Source: Ambulatory Visit | Attending: Radiation Oncology | Admitting: Radiation Oncology

## 2014-10-16 ENCOUNTER — Ambulatory Visit: Payer: Non-veteran care | Admitting: Nutrition

## 2014-10-16 ENCOUNTER — Ambulatory Visit: Payer: Medicare Other

## 2014-10-16 DIAGNOSIS — Z510 Encounter for antineoplastic radiation therapy: Secondary | ICD-10-CM | POA: Diagnosis not present

## 2014-10-16 NOTE — Progress Notes (Signed)
Followup completed with patient and wife. Patient states he has completed 16 radiation treatments to his parotid gland. Weight decreased and documented as 161.4 pounds December 14 decreased from 166.3 pounds December 7. Patient requires softer, bland foods. He drinks 4 cartons of boost breeze daily. Patient is drinking more water.  Nutrition diagnosis: Unintended weight loss continues.  Intervention: Patient to continue small frequent meals with high-calorie, high-protein foods. Recommended soft, high protein snacks for patient.  Provided fact sheet on making the most of each bite. Continue minimum four boost breeze daily. Questions were answered.  Teach back method used.  Monitoring, evaluation, goals: Patient will continue to tolerate increased calories and protein to minimize further weight loss.  Next visit: Tuesday, December 22.  After radiation therapy.  **Disclaimer: This note was dictated with voice recognition software. Similar sounding words can inadvertently be transcribed and this note may contain transcription errors which may not have been corrected upon publication of note.**

## 2014-10-17 ENCOUNTER — Ambulatory Visit
Admission: RE | Admit: 2014-10-17 | Discharge: 2014-10-17 | Disposition: A | Discharge status: Home / Self Care | Payer: Medicare Other | Source: Ambulatory Visit | Attending: Radiation Oncology | Admitting: Radiation Oncology

## 2014-10-17 ENCOUNTER — Ambulatory Visit: Payer: Medicare Other

## 2014-10-17 DIAGNOSIS — Z510 Encounter for antineoplastic radiation therapy: Secondary | ICD-10-CM | POA: Diagnosis not present

## 2014-10-18 ENCOUNTER — Ambulatory Visit: Payer: Medicare Other

## 2014-10-18 ENCOUNTER — Ambulatory Visit
Admission: RE | Admit: 2014-10-18 | Discharge: 2014-10-18 | Disposition: A | Discharge status: Home / Self Care | Payer: Medicare Other | Source: Ambulatory Visit | Attending: Radiation Oncology | Admitting: Radiation Oncology

## 2014-10-18 DIAGNOSIS — Z510 Encounter for antineoplastic radiation therapy: Secondary | ICD-10-CM | POA: Diagnosis not present

## 2014-10-19 ENCOUNTER — Ambulatory Visit
Admission: RE | Admit: 2014-10-19 | Discharge: 2014-10-19 | Disposition: A | Discharge status: Home / Self Care | Payer: Medicare Other | Source: Ambulatory Visit | Attending: Radiation Oncology | Admitting: Radiation Oncology

## 2014-10-19 ENCOUNTER — Ambulatory Visit: Payer: Medicare Other

## 2014-10-19 DIAGNOSIS — Z510 Encounter for antineoplastic radiation therapy: Secondary | ICD-10-CM | POA: Diagnosis not present

## 2014-10-22 ENCOUNTER — Ambulatory Visit
Admission: RE | Admit: 2014-10-22 | Discharge: 2014-10-22 | Disposition: A | Discharge status: Home / Self Care | Payer: Non-veteran care | Source: Ambulatory Visit | Attending: Radiation Oncology | Admitting: Radiation Oncology

## 2014-10-22 ENCOUNTER — Encounter: Payer: Self-pay | Admitting: Radiation Oncology

## 2014-10-22 ENCOUNTER — Ambulatory Visit: Payer: Medicare Other

## 2014-10-22 ENCOUNTER — Ambulatory Visit
Admission: RE | Admit: 2014-10-22 | Discharge: 2014-10-22 | Disposition: A | Discharge status: Home / Self Care | Payer: Medicare Other | Source: Ambulatory Visit | Attending: Radiation Oncology | Admitting: Radiation Oncology

## 2014-10-22 VITALS — BP 148/75 | HR 64 | Temp 97.5°F | Resp 12 | Wt 158.6 lb

## 2014-10-22 DIAGNOSIS — C44300 Unspecified malignant neoplasm of skin of unspecified part of face: Secondary | ICD-10-CM

## 2014-10-22 DIAGNOSIS — Z510 Encounter for antineoplastic radiation therapy: Secondary | ICD-10-CM | POA: Diagnosis not present

## 2014-10-22 NOTE — Progress Notes (Signed)
   Weekly Management Note:  outpatient    ICD-9-CM ICD-10-CM   1. Skin cancer of face 173.31 C44.310     Current Dose: 40 Gy  Projected Dose: 66 Gy   Narrative:  The patient presents for routine under treatment assessment.  CBCT/MVCT images/Port film x-rays were reviewed.  The chart was checked.  Crusting/moist peel of skin around ear. Sore throat.  Taste changes.   Physical Findings:  weight is 158 lb 9.6 oz (71.94 kg). His oral temperature is 97.5 F (36.4 C). His blood pressure is 148/75 and his pulse is 64. His respiration is 12 and oxygen saturation is 100%.    Notable for dry skin over left neck, intact.  No new neck masses.  Crusting/ moist fissures of skin around ear  Impression:  The patient is tolerating radiotherapy  Plan:   Continue treatments as planned.  Neosporin in moist skin areas. Lidocaine/narcotics prn. ________________________________   Eppie Gibson, M.D.

## 2014-10-22 NOTE — Progress Notes (Signed)
He is currently in no pain. Pt complains of sore throat, Loss of Sleep, Fatigue, Generalized Weakness and Poor Appetite.  Pt reports food burns when he eats. PO Diet: soft, 4 cans boost breeze. Oral exam reveals dry, red mouth. Skin- noted dryness, peeling, redness over forehead, opened wound left ear fold with yellow crusting. Marland Kitchen

## 2014-10-23 ENCOUNTER — Ambulatory Visit
Admission: RE | Admit: 2014-10-23 | Discharge: 2014-10-23 | Disposition: A | Discharge status: Home / Self Care | Payer: Medicare Other | Source: Ambulatory Visit | Attending: Radiation Oncology | Admitting: Radiation Oncology

## 2014-10-23 ENCOUNTER — Ambulatory Visit: Payer: Non-veteran care | Admitting: Nutrition

## 2014-10-23 ENCOUNTER — Ambulatory Visit: Payer: Medicare Other

## 2014-10-23 DIAGNOSIS — Z510 Encounter for antineoplastic radiation therapy: Secondary | ICD-10-CM | POA: Diagnosis not present

## 2014-10-23 NOTE — Progress Notes (Signed)
Nutrition follow-up completed with patient and wife after radiation treatment. Patient reports food has absolutely no taste. Weight decreased and documented as 158.6 pounds December 21, down from 161.4 pounds December 14. Patient has continued to drink boost breeze providing 250 cal, 9 g protein, each. Patient states he did not eat much this past weekend.  Nutrition diagnosis: Unintended weight loss continues.  Intervention: Recommended patient switched to Ensure Plus or boost plus in place of boost breeze to provide 350 cal, 14 g protein, each. Provided samples for patient to take with him today. Recommended patient choose soft moist foods with extra gravies and sauces as needed. Questions were answered.  Teach back method used.  Monitoring, evaluation, goals: Patient will work to increase overall calories and protein to minimize further weight loss.  Next visit: Tuesday, December 29 after radiation treatment.  **Disclaimer: This note was dictated with voice recognition software. Similar sounding words can inadvertently be transcribed and this note may contain transcription errors which may not have been corrected upon publication of note.**

## 2014-10-24 ENCOUNTER — Ambulatory Visit
Admission: RE | Admit: 2014-10-24 | Discharge: 2014-10-24 | Disposition: A | Discharge status: Home / Self Care | Payer: Medicare Other | Source: Ambulatory Visit | Attending: Radiation Oncology | Admitting: Radiation Oncology

## 2014-10-24 ENCOUNTER — Ambulatory Visit: Payer: Medicare Other

## 2014-10-24 ENCOUNTER — Encounter: Payer: Self-pay | Admitting: *Deleted

## 2014-10-24 DIAGNOSIS — Z510 Encounter for antineoplastic radiation therapy: Secondary | ICD-10-CM | POA: Diagnosis not present

## 2014-10-24 NOTE — Progress Notes (Signed)
To provide support and encouragement, care continuity and to assess for needs, met with patient after Tomo tmt.  1. He reported some discomfort with swallowing but ability to eat soft foods, drink.  "Nothing tastes good". 2. He stated he is applying Biafine BID. 3. He did not express any needs or concerns at this time, I encouraged him to contact me if that changes, he verbalized understanding.  Gayleen Orem, RN, BSN, [place] at [place] [phone]

## 2014-10-25 ENCOUNTER — Ambulatory Visit
Admission: RE | Admit: 2014-10-25 | Discharge: 2014-10-25 | Disposition: A | Discharge status: Home / Self Care | Payer: Medicare Other | Source: Ambulatory Visit | Attending: Radiation Oncology | Admitting: Radiation Oncology

## 2014-10-25 ENCOUNTER — Ambulatory Visit: Payer: Medicare Other

## 2014-10-25 DIAGNOSIS — Z510 Encounter for antineoplastic radiation therapy: Secondary | ICD-10-CM | POA: Diagnosis not present

## 2014-10-29 ENCOUNTER — Ambulatory Visit
Admission: RE | Admit: 2014-10-29 | Discharge: 2014-10-29 | Disposition: A | Discharge status: Home / Self Care | Payer: Medicare Other | Source: Ambulatory Visit | Attending: Radiation Oncology | Admitting: Radiation Oncology

## 2014-10-29 ENCOUNTER — Ambulatory Visit: Payer: Medicare Other

## 2014-10-29 ENCOUNTER — Encounter: Payer: Self-pay | Admitting: Radiation Oncology

## 2014-10-29 ENCOUNTER — Ambulatory Visit
Admission: RE | Admit: 2014-10-29 | Discharge: 2014-10-29 | Disposition: A | Discharge status: Home / Self Care | Payer: Non-veteran care | Source: Ambulatory Visit | Attending: Radiation Oncology | Admitting: Radiation Oncology

## 2014-10-29 VITALS — BP 136/74 | HR 65 | Temp 97.4°F | Resp 10 | Wt 156.0 lb

## 2014-10-29 DIAGNOSIS — Z510 Encounter for antineoplastic radiation therapy: Secondary | ICD-10-CM | POA: Diagnosis not present

## 2014-10-29 DIAGNOSIS — C44320 Squamous cell carcinoma of skin of unspecified parts of face: Secondary | ICD-10-CM

## 2014-10-29 MED ORDER — LIDOCAINE VISCOUS 2 % MT SOLN: OROMUCOSAL | Status: DC

## 2014-10-29 NOTE — Progress Notes (Signed)
He is currently in no pain. Pt complains of sore throat, Loss of Sleep, Fatigue and Poor Appetite.  Pt has had dysphagia for solids. PO Diet: soft in addition to 3-4 Breeze a day. Skin slight erythema over neck, noted a open wound on left ear (posterior and lobe), with a moderate amount of bloody drainage.

## 2014-10-29 NOTE — Progress Notes (Signed)
Weekly Management Note:  Site: Left face/neck Current Dose:  4800  cGy Projected Dose: 6600  cGy  Narrative: The patient is seen today for routine under treatment assessment. CBCT/MVCT images/port films were reviewed. The chart was reviewed.   He is seen today complaining of a sore throat.  He has dysphagia for solid foods.  He has been using viscous Xylocaine (2%), but is running out.  He has been using Neosporin ointment for desquamation behind his left ear.  Physical Examination:  Filed Vitals:   10/29/14 0821  BP: 136/74  Pulse: 65  Temp: 97.4 F (36.3 C)  Resp: 10  .  Weight: 156 lb (70.761 kg).  There is erythema along the left face/neck with moist desquamation behind his left ear.  Impression: Tolerating radiation therapy well.  We will get his Viscous Xylocaine refilled.  Plan: Continue radiation therapy as planned.

## 2014-10-30 ENCOUNTER — Ambulatory Visit: Payer: Non-veteran care | Admitting: Nutrition

## 2014-10-30 ENCOUNTER — Ambulatory Visit
Admission: RE | Admit: 2014-10-30 | Discharge: 2014-10-30 | Disposition: A | Discharge status: Home / Self Care | Payer: Medicare Other | Source: Ambulatory Visit | Attending: Radiation Oncology | Admitting: Radiation Oncology

## 2014-10-30 ENCOUNTER — Ambulatory Visit: Payer: Medicare Other

## 2014-10-30 DIAGNOSIS — Z510 Encounter for antineoplastic radiation therapy: Secondary | ICD-10-CM | POA: Diagnosis not present

## 2014-10-30 NOTE — Progress Notes (Signed)
Nutrition follow-up completed with patient and wife after radiation treatment for cancer of the left parotid gland. Food continues to have absolutely no taste. Weight decreased an additional 2 pounds documented as 156 pounds December 28. Patient tried Ensure Plus and boost plus and reports it gave him diarrhea. He does continue to tolerate boost breeze and agrees to trying to consume four to 5 of these daily.  Nutrition diagnosis: Unintended weight loss continues.  Intervention: Recommended patient consume a minimum of 4-5 boost breeze daily. Recommended patient drink 1 milk shake a day. Stressed the importance of increasing oral intake to minimize any further weight loss. Teach back method used.  Monitoring, evaluation, goals: Patient will work to increase overall calories and protein to minimize further weight loss.  Next visit: Monday, January 11.  **Disclaimer: This note was dictated with voice recognition software. Similar sounding words can inadvertently be transcribed and this note may contain transcription errors which may not have been corrected upon publication of note.**

## 2014-10-31 ENCOUNTER — Ambulatory Visit
Admission: RE | Admit: 2014-10-31 | Discharge: 2014-10-31 | Disposition: A | Discharge status: Home / Self Care | Payer: Medicare Other | Source: Ambulatory Visit | Attending: Radiation Oncology | Admitting: Radiation Oncology

## 2014-10-31 ENCOUNTER — Ambulatory Visit: Payer: Medicare Other

## 2014-10-31 DIAGNOSIS — Z510 Encounter for antineoplastic radiation therapy: Secondary | ICD-10-CM | POA: Diagnosis not present

## 2014-11-01 ENCOUNTER — Ambulatory Visit: Payer: Medicare Other

## 2014-11-01 ENCOUNTER — Ambulatory Visit
Admission: RE | Admit: 2014-11-01 | Discharge: 2014-11-01 | Disposition: A | Discharge status: Home / Self Care | Payer: Medicare Other | Source: Ambulatory Visit | Attending: Radiation Oncology | Admitting: Radiation Oncology

## 2014-11-01 DIAGNOSIS — Z510 Encounter for antineoplastic radiation therapy: Secondary | ICD-10-CM | POA: Diagnosis not present

## 2014-11-05 ENCOUNTER — Ambulatory Visit
Admission: RE | Admit: 2014-11-05 | Discharge: 2014-11-05 | Disposition: A | Discharge status: Home / Self Care | Payer: Medicare Other | Source: Ambulatory Visit | Attending: Radiation Oncology | Admitting: Radiation Oncology

## 2014-11-05 ENCOUNTER — Ambulatory Visit: Payer: Medicare Other

## 2014-11-05 ENCOUNTER — Encounter: Payer: Self-pay | Admitting: Radiation Oncology

## 2014-11-05 VITALS — BP 146/79 | HR 65 | Temp 97.5°F | Resp 20 | Wt 154.4 lb

## 2014-11-05 DIAGNOSIS — R29810 Facial weakness: Secondary | ICD-10-CM | POA: Diagnosis not present

## 2014-11-05 DIAGNOSIS — C44310 Basal cell carcinoma of skin of unspecified parts of face: Secondary | ICD-10-CM | POA: Insufficient documentation

## 2014-11-05 DIAGNOSIS — R200 Anesthesia of skin: Secondary | ICD-10-CM | POA: Diagnosis not present

## 2014-11-05 DIAGNOSIS — C44300 Unspecified malignant neoplasm of skin of unspecified part of face: Secondary | ICD-10-CM

## 2014-11-05 DIAGNOSIS — C07 Malignant neoplasm of parotid gland: Secondary | ICD-10-CM | POA: Diagnosis not present

## 2014-11-05 DIAGNOSIS — C770 Secondary and unspecified malignant neoplasm of lymph nodes of head, face and neck: Secondary | ICD-10-CM | POA: Diagnosis not present

## 2014-11-05 DIAGNOSIS — F419 Anxiety disorder, unspecified: Secondary | ICD-10-CM | POA: Diagnosis not present

## 2014-11-05 DIAGNOSIS — E119 Type 2 diabetes mellitus without complications: Secondary | ICD-10-CM | POA: Diagnosis not present

## 2014-11-05 DIAGNOSIS — C44320 Squamous cell carcinoma of skin of unspecified parts of face: Secondary | ICD-10-CM | POA: Diagnosis not present

## 2014-11-05 DIAGNOSIS — R1310 Dysphagia, unspecified: Secondary | ICD-10-CM | POA: Diagnosis not present

## 2014-11-05 DIAGNOSIS — Z79899 Other long term (current) drug therapy: Secondary | ICD-10-CM | POA: Diagnosis not present

## 2014-11-05 DIAGNOSIS — Z510 Encounter for antineoplastic radiation therapy: Secondary | ICD-10-CM | POA: Diagnosis not present

## 2014-11-05 DIAGNOSIS — J449 Chronic obstructive pulmonary disease, unspecified: Secondary | ICD-10-CM | POA: Diagnosis not present

## 2014-11-05 DIAGNOSIS — R630 Anorexia: Secondary | ICD-10-CM | POA: Insufficient documentation

## 2014-11-05 DIAGNOSIS — K219 Gastro-esophageal reflux disease without esophagitis: Secondary | ICD-10-CM | POA: Diagnosis not present

## 2014-11-05 DIAGNOSIS — I10 Essential (primary) hypertension: Secondary | ICD-10-CM | POA: Diagnosis not present

## 2014-11-05 DIAGNOSIS — Z87891 Personal history of nicotine dependence: Secondary | ICD-10-CM | POA: Diagnosis not present

## 2014-11-05 DIAGNOSIS — Z7982 Long term (current) use of aspirin: Secondary | ICD-10-CM | POA: Diagnosis not present

## 2014-11-05 MED ORDER — BIAFINE EX EMUL
Freq: Two times a day (BID) | CUTANEOUS | Status: DC
  Administered 2014-11-05: 09:00:00 via TOPICAL

## 2014-11-05 NOTE — Progress Notes (Addendum)
Patient reports "sore throat"; he is eating soft foods, drinks 4 Boost Breeze a day. Using MMW and water with salt-baking soda for sore throat. He has loss of appetite, states his tastes "are off". He is applying Biafine to left neck for dry desquamtion, hyper pigmentation; behind left ear he has moistness but no bleeding today, applying Neosporin there. Tongue, roof of mouth without signs of thrush.

## 2014-11-05 NOTE — Progress Notes (Signed)
   Weekly Management Note:  outpatient    ICD-9-CM ICD-10-CM   1. Skin cancer of face 173.31 C44.310   2. Cancer of parotid gland 142.0 C07 topical emolient (BIAFINE) emulsion    Current Dose:56 Gy  Projected Dose: 66 Gy   Narrative:  The patient presents for routine under treatment assessment.  CBCT/MVCT images/Port film x-rays were reviewed.  The chart was checked.  Patient reports "sore throat"; he is eating soft foods, drinks 4 Boost Breeze a day. Using MMW and water with salt-baking soda for sore throat. He has loss of appetite, states his tastes "are off". He is applying Biafine to left neck for dry desquamation, hyper pigmentation; behind left ear he has moistness but no bleeding today, applying Neosporin there. Tongue, roof of mouth without signs of thrush.  Pain well controlled  Physical Findings:  weight is 154 lb 6.4 oz (70.035 kg). His oral temperature is 97.5 F (36.4 C). His blood pressure is 146/79 and his pulse is 65. His respiration is 20.    Notable for dry skin over left neck, intact. Behind left ear he has moistness but no bleeding. No new neck masses.    Impression:  The patient is tolerating radiotherapy  Plan:   Continue treatments as planned.  Neosporin in moist skin areas. Guaze given to protect this area from his glasses. ________________________________   Eppie Gibson, M.D.

## 2014-11-06 ENCOUNTER — Ambulatory Visit: Payer: Non-veteran care | Admitting: Nutrition

## 2014-11-06 ENCOUNTER — Ambulatory Visit
Admission: RE | Admit: 2014-11-06 | Discharge: 2014-11-06 | Disposition: A | Discharge status: Home / Self Care | Payer: Medicare Other | Source: Ambulatory Visit | Attending: Radiation Oncology | Admitting: Radiation Oncology

## 2014-11-06 ENCOUNTER — Ambulatory Visit: Payer: Medicare Other

## 2014-11-06 DIAGNOSIS — F419 Anxiety disorder, unspecified: Secondary | ICD-10-CM | POA: Diagnosis not present

## 2014-11-06 DIAGNOSIS — K219 Gastro-esophageal reflux disease without esophagitis: Secondary | ICD-10-CM | POA: Diagnosis not present

## 2014-11-06 DIAGNOSIS — C44320 Squamous cell carcinoma of skin of unspecified parts of face: Secondary | ICD-10-CM | POA: Diagnosis not present

## 2014-11-06 DIAGNOSIS — Z7982 Long term (current) use of aspirin: Secondary | ICD-10-CM | POA: Diagnosis not present

## 2014-11-06 DIAGNOSIS — C770 Secondary and unspecified malignant neoplasm of lymph nodes of head, face and neck: Secondary | ICD-10-CM | POA: Diagnosis not present

## 2014-11-06 DIAGNOSIS — E119 Type 2 diabetes mellitus without complications: Secondary | ICD-10-CM | POA: Diagnosis not present

## 2014-11-06 DIAGNOSIS — R200 Anesthesia of skin: Secondary | ICD-10-CM | POA: Diagnosis not present

## 2014-11-06 DIAGNOSIS — R1310 Dysphagia, unspecified: Secondary | ICD-10-CM | POA: Diagnosis not present

## 2014-11-06 DIAGNOSIS — Z510 Encounter for antineoplastic radiation therapy: Secondary | ICD-10-CM | POA: Diagnosis not present

## 2014-11-06 DIAGNOSIS — J449 Chronic obstructive pulmonary disease, unspecified: Secondary | ICD-10-CM | POA: Diagnosis not present

## 2014-11-06 DIAGNOSIS — R29810 Facial weakness: Secondary | ICD-10-CM | POA: Diagnosis not present

## 2014-11-06 DIAGNOSIS — I10 Essential (primary) hypertension: Secondary | ICD-10-CM | POA: Diagnosis not present

## 2014-11-06 DIAGNOSIS — Z79899 Other long term (current) drug therapy: Secondary | ICD-10-CM | POA: Diagnosis not present

## 2014-11-06 DIAGNOSIS — Z87891 Personal history of nicotine dependence: Secondary | ICD-10-CM | POA: Diagnosis not present

## 2014-11-06 NOTE — Progress Notes (Signed)
Nutrition follow-up completed with patient and wife after radiation treatment for cancer of the left parotid gland.   Food has no taste. Weight decreased another pound and a half with weight documented as 154.4 pounds January 4, down from 156 pounds December 28. Patient continues to tolerate four boost breeze daily. Patient admits to inadequate oral intake.  Nutrition diagnosis: Unintended weight loss continues.  Intervention: Patient to continue boost breeze four times daily. Patient will add two Carnation breakfast essentials daily using whole milk and nonfat dried milk powder. Patient will continue to try to increase oral intake at mealtimes. Recipes were provided. Samples were provided. Questions were answered and teach back method used.  Monitoring, evaluation, goals: Patient has been unable to increase calories and protein, and has had further weight loss.  Next visit: Monday, January 11 after his final radiation therapy.  **Disclaimer: This note was dictated with voice recognition software. Similar sounding words can inadvertently be transcribed and this note may contain transcription errors which may not have been corrected upon publication of note.**

## 2014-11-07 ENCOUNTER — Ambulatory Visit
Admission: RE | Admit: 2014-11-07 | Discharge: 2014-11-07 | Disposition: A | Discharge status: Home / Self Care | Payer: Medicare Other | Source: Ambulatory Visit | Attending: Radiation Oncology | Admitting: Radiation Oncology

## 2014-11-07 ENCOUNTER — Ambulatory Visit: Payer: Medicare Other

## 2014-11-07 DIAGNOSIS — C44320 Squamous cell carcinoma of skin of unspecified parts of face: Secondary | ICD-10-CM | POA: Diagnosis not present

## 2014-11-07 DIAGNOSIS — I10 Essential (primary) hypertension: Secondary | ICD-10-CM | POA: Diagnosis not present

## 2014-11-07 DIAGNOSIS — R1310 Dysphagia, unspecified: Secondary | ICD-10-CM | POA: Diagnosis not present

## 2014-11-07 DIAGNOSIS — J449 Chronic obstructive pulmonary disease, unspecified: Secondary | ICD-10-CM | POA: Diagnosis not present

## 2014-11-07 DIAGNOSIS — E119 Type 2 diabetes mellitus without complications: Secondary | ICD-10-CM | POA: Diagnosis not present

## 2014-11-07 DIAGNOSIS — K219 Gastro-esophageal reflux disease without esophagitis: Secondary | ICD-10-CM | POA: Diagnosis not present

## 2014-11-07 DIAGNOSIS — Z510 Encounter for antineoplastic radiation therapy: Secondary | ICD-10-CM | POA: Diagnosis not present

## 2014-11-07 DIAGNOSIS — R29810 Facial weakness: Secondary | ICD-10-CM | POA: Diagnosis not present

## 2014-11-07 DIAGNOSIS — Z79899 Other long term (current) drug therapy: Secondary | ICD-10-CM | POA: Diagnosis not present

## 2014-11-07 DIAGNOSIS — Z87891 Personal history of nicotine dependence: Secondary | ICD-10-CM | POA: Diagnosis not present

## 2014-11-07 DIAGNOSIS — Z7982 Long term (current) use of aspirin: Secondary | ICD-10-CM | POA: Diagnosis not present

## 2014-11-07 DIAGNOSIS — C770 Secondary and unspecified malignant neoplasm of lymph nodes of head, face and neck: Secondary | ICD-10-CM | POA: Diagnosis not present

## 2014-11-07 DIAGNOSIS — F419 Anxiety disorder, unspecified: Secondary | ICD-10-CM | POA: Diagnosis not present

## 2014-11-07 DIAGNOSIS — R200 Anesthesia of skin: Secondary | ICD-10-CM | POA: Diagnosis not present

## 2014-11-08 ENCOUNTER — Ambulatory Visit
Admission: RE | Admit: 2014-11-08 | Discharge: 2014-11-08 | Disposition: A | Discharge status: Home / Self Care | Payer: Medicare Other | Source: Ambulatory Visit | Attending: Radiation Oncology | Admitting: Radiation Oncology

## 2014-11-08 ENCOUNTER — Ambulatory Visit: Payer: Medicare Other

## 2014-11-08 DIAGNOSIS — K219 Gastro-esophageal reflux disease without esophagitis: Secondary | ICD-10-CM | POA: Diagnosis not present

## 2014-11-08 DIAGNOSIS — I10 Essential (primary) hypertension: Secondary | ICD-10-CM | POA: Diagnosis not present

## 2014-11-08 DIAGNOSIS — Z79899 Other long term (current) drug therapy: Secondary | ICD-10-CM | POA: Diagnosis not present

## 2014-11-08 DIAGNOSIS — E119 Type 2 diabetes mellitus without complications: Secondary | ICD-10-CM | POA: Diagnosis not present

## 2014-11-08 DIAGNOSIS — Z7982 Long term (current) use of aspirin: Secondary | ICD-10-CM | POA: Diagnosis not present

## 2014-11-08 DIAGNOSIS — C770 Secondary and unspecified malignant neoplasm of lymph nodes of head, face and neck: Secondary | ICD-10-CM | POA: Diagnosis not present

## 2014-11-08 DIAGNOSIS — F419 Anxiety disorder, unspecified: Secondary | ICD-10-CM | POA: Diagnosis not present

## 2014-11-08 DIAGNOSIS — Z87891 Personal history of nicotine dependence: Secondary | ICD-10-CM | POA: Diagnosis not present

## 2014-11-08 DIAGNOSIS — Z510 Encounter for antineoplastic radiation therapy: Secondary | ICD-10-CM | POA: Diagnosis not present

## 2014-11-08 DIAGNOSIS — R29810 Facial weakness: Secondary | ICD-10-CM | POA: Diagnosis not present

## 2014-11-08 DIAGNOSIS — R1310 Dysphagia, unspecified: Secondary | ICD-10-CM | POA: Diagnosis not present

## 2014-11-08 DIAGNOSIS — C44320 Squamous cell carcinoma of skin of unspecified parts of face: Secondary | ICD-10-CM | POA: Diagnosis not present

## 2014-11-08 DIAGNOSIS — J449 Chronic obstructive pulmonary disease, unspecified: Secondary | ICD-10-CM | POA: Diagnosis not present

## 2014-11-08 DIAGNOSIS — R200 Anesthesia of skin: Secondary | ICD-10-CM | POA: Diagnosis not present

## 2014-11-09 ENCOUNTER — Ambulatory Visit: Payer: Medicare Other

## 2014-11-09 ENCOUNTER — Ambulatory Visit
Admission: RE | Admit: 2014-11-09 | Discharge: 2014-11-09 | Disposition: A | Discharge status: Home / Self Care | Payer: Medicare Other | Source: Ambulatory Visit | Attending: Radiation Oncology | Admitting: Radiation Oncology

## 2014-11-09 ENCOUNTER — Ambulatory Visit: Payer: Medicare Other | Admitting: Radiation Oncology

## 2014-11-09 DIAGNOSIS — R1310 Dysphagia, unspecified: Secondary | ICD-10-CM | POA: Diagnosis not present

## 2014-11-09 DIAGNOSIS — Z510 Encounter for antineoplastic radiation therapy: Secondary | ICD-10-CM | POA: Diagnosis not present

## 2014-11-09 DIAGNOSIS — R200 Anesthesia of skin: Secondary | ICD-10-CM | POA: Diagnosis not present

## 2014-11-09 DIAGNOSIS — R29810 Facial weakness: Secondary | ICD-10-CM | POA: Diagnosis not present

## 2014-11-09 DIAGNOSIS — K219 Gastro-esophageal reflux disease without esophagitis: Secondary | ICD-10-CM | POA: Diagnosis not present

## 2014-11-09 DIAGNOSIS — Z79899 Other long term (current) drug therapy: Secondary | ICD-10-CM | POA: Diagnosis not present

## 2014-11-09 DIAGNOSIS — C770 Secondary and unspecified malignant neoplasm of lymph nodes of head, face and neck: Secondary | ICD-10-CM | POA: Diagnosis not present

## 2014-11-09 DIAGNOSIS — J449 Chronic obstructive pulmonary disease, unspecified: Secondary | ICD-10-CM | POA: Diagnosis not present

## 2014-11-09 DIAGNOSIS — C44320 Squamous cell carcinoma of skin of unspecified parts of face: Secondary | ICD-10-CM | POA: Diagnosis not present

## 2014-11-09 DIAGNOSIS — I10 Essential (primary) hypertension: Secondary | ICD-10-CM | POA: Diagnosis not present

## 2014-11-09 DIAGNOSIS — Z7982 Long term (current) use of aspirin: Secondary | ICD-10-CM | POA: Diagnosis not present

## 2014-11-09 DIAGNOSIS — E119 Type 2 diabetes mellitus without complications: Secondary | ICD-10-CM | POA: Diagnosis not present

## 2014-11-09 DIAGNOSIS — F419 Anxiety disorder, unspecified: Secondary | ICD-10-CM | POA: Diagnosis not present

## 2014-11-09 DIAGNOSIS — Z87891 Personal history of nicotine dependence: Secondary | ICD-10-CM | POA: Diagnosis not present

## 2014-11-12 ENCOUNTER — Encounter: Payer: Self-pay | Admitting: *Deleted

## 2014-11-12 ENCOUNTER — Ambulatory Visit: Payer: Medicare Other | Admitting: Radiation Oncology

## 2014-11-12 ENCOUNTER — Ambulatory Visit: Payer: Non-veteran care | Admitting: Nutrition

## 2014-11-12 ENCOUNTER — Encounter: Payer: Self-pay | Admitting: Radiation Oncology

## 2014-11-12 ENCOUNTER — Ambulatory Visit
Admission: RE | Admit: 2014-11-12 | Discharge: 2014-11-12 | Disposition: A | Discharge status: Home / Self Care | Payer: Medicare Other | Source: Ambulatory Visit | Attending: Radiation Oncology | Admitting: Radiation Oncology

## 2014-11-12 ENCOUNTER — Ambulatory Visit
Admission: RE | Admit: 2014-11-12 | Discharge: 2014-11-12 | Disposition: A | Discharge status: Home / Self Care | Payer: Non-veteran care | Source: Ambulatory Visit | Attending: Radiation Oncology | Admitting: Radiation Oncology

## 2014-11-12 VITALS — BP 142/91 | HR 68 | Temp 97.2°F | Resp 20 | Wt 152.0 lb

## 2014-11-12 DIAGNOSIS — C44320 Squamous cell carcinoma of skin of unspecified parts of face: Secondary | ICD-10-CM | POA: Diagnosis not present

## 2014-11-12 DIAGNOSIS — K219 Gastro-esophageal reflux disease without esophagitis: Secondary | ICD-10-CM | POA: Diagnosis not present

## 2014-11-12 DIAGNOSIS — C770 Secondary and unspecified malignant neoplasm of lymph nodes of head, face and neck: Secondary | ICD-10-CM | POA: Diagnosis not present

## 2014-11-12 DIAGNOSIS — J449 Chronic obstructive pulmonary disease, unspecified: Secondary | ICD-10-CM | POA: Diagnosis not present

## 2014-11-12 DIAGNOSIS — R29810 Facial weakness: Secondary | ICD-10-CM | POA: Diagnosis not present

## 2014-11-12 DIAGNOSIS — Z7982 Long term (current) use of aspirin: Secondary | ICD-10-CM | POA: Diagnosis not present

## 2014-11-12 DIAGNOSIS — Z510 Encounter for antineoplastic radiation therapy: Secondary | ICD-10-CM | POA: Diagnosis not present

## 2014-11-12 DIAGNOSIS — F419 Anxiety disorder, unspecified: Secondary | ICD-10-CM | POA: Diagnosis not present

## 2014-11-12 DIAGNOSIS — R200 Anesthesia of skin: Secondary | ICD-10-CM | POA: Diagnosis not present

## 2014-11-12 DIAGNOSIS — E119 Type 2 diabetes mellitus without complications: Secondary | ICD-10-CM | POA: Diagnosis not present

## 2014-11-12 DIAGNOSIS — I10 Essential (primary) hypertension: Secondary | ICD-10-CM | POA: Diagnosis not present

## 2014-11-12 DIAGNOSIS — R1310 Dysphagia, unspecified: Secondary | ICD-10-CM | POA: Diagnosis not present

## 2014-11-12 DIAGNOSIS — Z79899 Other long term (current) drug therapy: Secondary | ICD-10-CM | POA: Diagnosis not present

## 2014-11-12 DIAGNOSIS — Z87891 Personal history of nicotine dependence: Secondary | ICD-10-CM | POA: Diagnosis not present

## 2014-11-12 MED ORDER — HYDROCODONE-ACETAMINOPHEN 5-325 MG PO TABS: 1.0000 | ORAL_TABLET | ORAL | Status: DC | PRN

## 2014-11-12 NOTE — Progress Notes (Signed)
Nutrition follow-up completed with patient and wife.   Today was his last radiation treatment for cancer of the left parotid gland.  Patient did not receive chemotherapy. Food continues to have poor taste.  Weight decreased another 2 pounds documented as 152 pounds November 11, down from 154 pounds January 4. Patient is tolerating four boost breeze daily. Patient reports he was able to consume Carnation breakfast without difficulty. Oral intake of food inadequate.  Nutrition diagnosis: Unintended weight loss continues.  Intervention: Patient to continue boost breeze 4 times a day. Patient educated to consume 2 Carnation breakfast essentials daily. Encouraged patient to try different foods to increase oral intake. Educated patient on the importance of maintaining nutrition and increased needs during the weeks after treatment. Questions were answered.  Teach back method used.  Monitoring, evaluation, goals: Patient has been unable to consume adequate calories and protein, and has lost weight throughout treatment.  However, he is consuming oral nutrition supplements.  Next visit: Will contact patient by phone as needed.  Patient agrees to call me with questions or concerns.  **Disclaimer: This note was dictated with voice recognition software. Similar sounding words can inadvertently be transcribed and this note may contain transcription errors which may not have been corrected upon publication of note.**

## 2014-11-12 NOTE — Progress Notes (Signed)
Met with pt and his wife during final RT to offer support and to celebrate end of radiation treatment.  I presented wife with a Certificate of Recognition.  I explained that my role as navigator will continue for several more months and that I will be calling and/or joining them during follow-up visits.  They understand they can contact me with questions/concerns.    Gayleen Orem, RN, BSN, [place] at [place] [phone]

## 2014-11-12 NOTE — Progress Notes (Signed)
Patient reports sore throat; he states no worse that last week. He continues to use MMW, water w/salt-soda. He has loss of appetite, loss of tastes but is drinking Boost Breeze 4 cans daily. He is applying Neosporin behind left ear for moist desquamation, no bleeding noted, applying Biafine for dry desquamation of neck treatment area. He states he has another tube at home. Pt weighed at home; scale in nursing not operating this morning. He states this is weight without shoes or fully clothed. Pt completed treatment today. Will get FU per Dr Isidore Moos in the event she wants to see pt before the usual 1 month FU.

## 2014-11-12 NOTE — Progress Notes (Signed)
   Weekly Management Note:  outpatient    ICD-9-CM ICD-10-CM   1. 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  CBCT/MVCT images/Port film x-rays were reviewed.  The chart was checked.  Patient reports sore throat; he states no worse that last week. He continues to use MMW, water w/salt-soda. He has loss of appetite, loss of tastes but is drinking Boost Breeze 4 cans daily and water.  He is applying Neosporin behind left ear for moist desquamation, no bleeding noted, applying Biafine for dry desquamation of neck treatment area. He states he has another tube at home. Pt weighed at home; scale in nursing not operating this morning. He states this is weight without shoes or fully clothed. Pt completed treatment today. Asks for hydrocodone refill   Physical Findings:  weight is 152 lb (68.947 kg). His oral temperature is 97.2 F (36.2 C). His blood pressure is 142/91 and his pulse is 68. His respiration is 20.    Notable for dry skin over left neck, intact. Behind left ear he has moistness but no bleeding.  Oral cavity dry, no thrush or bleeding   Impression:  completed radiotherapy  Plan:   Continue Neosporin in moist skin areas. Guaze given to protect this area from his glasses. hydrocodone refilled. F/u in 2-3 weeks, sooner if needed ________________________________   Eppie Gibson, M.D.

## 2014-11-13 NOTE — Progress Notes (Signed)
  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     Plan: The patient has completed radiation treatment. The patient will return to radiation oncology clinic for routine followup in 3 weeks. I advised them to call or return sooner if they have any questions or concerns related to their recovery or treatment.  -----------------------------------  Eppie Gibson, MD

## 2014-11-20 ENCOUNTER — Telehealth: Payer: Self-pay | Admitting: *Deleted

## 2014-11-20 NOTE — Telephone Encounter (Signed)
Called patient to check on his well-being since completion of RT last week Monday.   He reported:  "Still can't eat like I want" d/t throat soreness.  He has MMW available.  Neck erythema is resolving.  He sees his Psychologist, sport and exercise tomorrow. He confirmed his understanding of a 12/07/14 1040 f/u appt with Dr. Isidore Moos. He understands he can call me with questions/concerns.  Gayleen Orem, RN, BSN, [place] at [place] [phone]

## 2014-11-21 DIAGNOSIS — C07 Malignant neoplasm of parotid gland: Secondary | ICD-10-CM | POA: Diagnosis not present

## 2014-11-21 DIAGNOSIS — C770 Secondary and unspecified malignant neoplasm of lymph nodes of head, face and neck: Secondary | ICD-10-CM | POA: Diagnosis not present

## 2014-11-26 ENCOUNTER — Other Ambulatory Visit: Payer: Self-pay | Admitting: Internal Medicine

## 2014-11-28 NOTE — Telephone Encounter (Signed)
Pls advise on refill.../lmb 

## 2014-11-28 NOTE — Telephone Encounter (Signed)
Patient called regarding his refill request. He would like this done ASAP

## 2014-11-29 ENCOUNTER — Encounter: Payer: Self-pay | Admitting: *Deleted

## 2014-11-29 NOTE — Telephone Encounter (Signed)
Ok Thx 

## 2014-11-29 NOTE — Telephone Encounter (Signed)
Notified pt rx has been call in to his pharmacy spoke with christy gave md approval.../lmb

## 2014-11-29 NOTE — Telephone Encounter (Signed)
Please advise on refill request. Patient is calling to check status. He will be out tomorrow.

## 2014-11-30 ENCOUNTER — Telehealth: Payer: Self-pay | Admitting: *Deleted

## 2014-11-30 NOTE — Telephone Encounter (Signed)
Called patient to check on his well-being.  He is 2 1/2 weeks s/p final RT. He reported:  Not eating "like I want" d/t sore throat.  Throat soreness persists although not as badly as when we talked last.   He indicated he is using Lidocaine rinse several times daily.  I encouraged its use prior to meals.  Dry mouth.  He is using Biotene regularly.  I encouraged frequent sips of water.  Denied any neck irritation, soreness; not applying Biafine.  "Skin looks real good".  I encouraged continuing use of Biafine until supply depleted. He verbalized understanding of next Friday's 10:40 f/u appt with Dr. Isidore Moos. He understands he can contact me needs/concerns.  Gayleen Orem, RN, BSN, [place] at [place] [phone]

## 2014-12-07 ENCOUNTER — Ambulatory Visit
Admission: RE | Admit: 2014-12-07 | Discharge: 2014-12-07 | Disposition: A | Discharge status: Home / Self Care | Payer: Medicare Other | Source: Ambulatory Visit | Attending: Radiation Oncology | Admitting: Radiation Oncology

## 2014-12-07 ENCOUNTER — Telehealth: Payer: Self-pay | Admitting: *Deleted

## 2014-12-07 ENCOUNTER — Encounter: Payer: Self-pay | Admitting: Radiation Oncology

## 2014-12-07 DIAGNOSIS — Z7982 Long term (current) use of aspirin: Secondary | ICD-10-CM | POA: Diagnosis not present

## 2014-12-07 DIAGNOSIS — K117 Disturbances of salivary secretion: Secondary | ICD-10-CM | POA: Insufficient documentation

## 2014-12-07 DIAGNOSIS — C770 Secondary and unspecified malignant neoplasm of lymph nodes of head, face and neck: Secondary | ICD-10-CM | POA: Diagnosis not present

## 2014-12-07 DIAGNOSIS — L599 Disorder of the skin and subcutaneous tissue related to radiation, unspecified: Secondary | ICD-10-CM | POA: Insufficient documentation

## 2014-12-07 DIAGNOSIS — C44300 Unspecified malignant neoplasm of skin of unspecified part of face: Secondary | ICD-10-CM

## 2014-12-07 DIAGNOSIS — Z79899 Other long term (current) drug therapy: Secondary | ICD-10-CM | POA: Diagnosis not present

## 2014-12-07 DIAGNOSIS — C44329 Squamous cell carcinoma of skin of other parts of face: Secondary | ICD-10-CM | POA: Diagnosis not present

## 2014-12-07 MED ORDER — SILVER SULFADIAZINE 1 % EX CREA
TOPICAL_CREAM | Freq: Two times a day (BID) | CUTANEOUS | Status: DC
  Administered 2014-12-07: 15:00:00 via TOPICAL

## 2014-12-07 NOTE — Addendum Note (Signed)
Encounter addended by: Andria Rhein, RN on: 12/07/2014  3:17 PM<BR>     Documentation filed: Inpatient MAR

## 2014-12-07 NOTE — Telephone Encounter (Signed)
CALLED PATIENT OF SCAN AND FU VISIT, SPOKE WITH AND HE IS AWARE OF THESE APPTS.

## 2014-12-07 NOTE — Progress Notes (Signed)
Pain Status: painful swallowing, improved slightly since completing radiation  Weight changes, if any: lost 1 lb in past 3 weeks  Nutritional Status a) intake: drinks 3-4 nutritional supplements daily b) using a feeding tube?: no c) weight changes, if any:   Swallowing Status: painful but improving slowly, MMW helpful, has 1 more refill  Dental (if applicable): When was last visit with dentistry will see Dr Enrique Sack in March when insurance is "in place" Using fluoride trays daily? na   When was last ENT visit? 11/29/14  When is next ENT visit? 04-06-2015  Other notable issues, if any: fatigued but improving, sweet taste returning somewhat, no thick saliva issues

## 2014-12-07 NOTE — Progress Notes (Signed)
Radiation Oncology         [phone] ________________________________  Name: [name] MRN: [id number]  Date: 12/07/2014  DOB: Apr 07, 1941  Follow-Up Visit Note  CC: Walker Kehr, MD  Rozetta Nunnery, *  Diagnosis and Prior Radiotherapy:       ICD-9-CM ICD-10-CM   1. 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  Overall feels better  Pain Status: painful swallowing, improved slightly since completing radiation  Weight changes, if any: lost 1 lb in past 3 weeks  Nutritional Status a) intake: drinks 3-4 nutritional supplements daily b) using a feeding tube?: no c) weight changes, if any:   Swallowing Status: painful but improving slowly, MMW helpful, has 1 more refill  Dental (if applicable): When was last visit with dentistry will see Dr Enrique Sack in March when insurance is "in place" Using fluoride trays daily? na   When was last ENT visit? 11/29/14  When is next ENT visit? 2015/04/05  Other notable issues, if any: fatigued but improving, sweet taste returning somewhat, mouth dry                ALLERGIES:  is allergic to amlodipine besylate; benazepril hcl; risperidone; tramadol; valsartan; and penicillins.  Meds: Current Outpatient Prescriptions  Medication Sig Dispense Refill  . ALPRAZolam (XANAX) 0.5 MG tablet TAKE 1 TABLET TWICE A DAY AS NEEDED 60 tablet 3  . Alum & Mag Hydroxide-Simeth (MAGIC MOUTHWASH) SOLN Take 5 mLs by mouth.    Marland Kitchen aspirin EC 81 MG tablet Take 81 mg by mouth every morning.     . calcium-vitamin D (OSCAL WITH D) 500-200 MG-UNIT per  tablet Take 1 tablet by mouth 3 (three) times daily.    . cholecalciferol (VITAMIN D) 1000 UNITS tablet Take 4,000 Units by mouth every morning.    . cloNIDine (CATAPRES) 0.1 MG tablet Take 0.1 mg by mouth 2 (two) times daily.    Marland Kitchen docusate sodium (COLACE) 100 MG capsule Take 1 capsule (100 mg total) by mouth 2 (two) times daily as needed for mild constipation. 60 capsule 2  . FLUoxetine (PROZAC) 10 MG capsule Take 1 capsule (10 mg total) by mouth daily. 90 capsule 3  . gabapentin (NEURONTIN) 100 MG capsule Take 1 capsule (100 mg total) by mouth 2 (two) times daily. 180 capsule 3  . glimepiride (AMARYL) 1 MG tablet Take 1 tablet (1 mg total) by mouth 2 (two) times daily. 180 tablet 3  . glucose blood (ONE TOUCH TEST STRIPS) test strip Use as instructed 300 each 3  . HYDROcodone-acetaminophen (NORCO/VICODIN) 5-325 MG per tablet Take 1 tablet by mouth every 4 (four) hours as needed for moderate pain. 60 tablet 0  . iron polysaccharides (NIFEREX) 150 MG capsule Take 1 capsule (150 mg total) by mouth daily. 30 capsule 2  . labetalol (NORMODYNE) 200 MG tablet Take 1 tablet (200 mg total) by mouth 3 (three) times daily. 270 tablet 2  . lidocaine (XYLOCAINE) 2 % solution Patient: Mix 1part 2% viscous lidocaine, 1part H20. Swish and/or swallow 6mL of this mixture, 66min before meals  and at bedtime, up to QID 100 mL 5  . Magnesium 300 MG CAPS Take 300 mg by mouth daily.    . mirtazapine (REMERON) 15 MG tablet Take 1-2 tablets (15-30 mg total) by mouth at bedtime as needed (for sleep). 90 tablet 3  . Multiple Vitamin (MULTIVITAMIN WITH MINERALS) TABS tablet Take 1 tablet by mouth every morning.    Marland Kitchen omeprazole (PRILOSEC) 40 MG capsule Take 1 capsule (40 mg total) by mouth 2 (two) times daily. 180 capsule 3  . ONETOUCH DELICA LANCETS FINE MISC     . sodium bicarbonate 650 MG tablet Take 1,300 mg by mouth every morning.     . Suvorexant (BELSOMRA) 20 MG TABS Take 20 mg by mouth at bedtime as needed. 30 tablet  5  . Umeclidinium-Vilanterol (ANORO ELLIPTA) 62.5-25 MCG/INH AEPB Inhale 1 puff into the lungs daily.     Current Facility-Administered Medications  Medication Dose Route Frequency Provider Last Rate Last Dose  . silver sulfADIAZINE (SILVADENE) 1 % cream   Topical BID Eppie Gibson, MD        Physical Findings: The patient is in no acute distress. Patient is alert and oriented. Oropharyngeal mucosa is dry with no thrush or lesions. No palpable cervical or supraclavicular lymphadenopathy. Skin behind left ear is still with moist desquamation.  Lab Findings: Lab Results  Component Value Date   WBC 9.9 08/09/2014   HGB 10.2* 08/09/2014   HCT 32.2* 08/09/2014   MCV 97.0 08/09/2014   PLT 289 08/09/2014    Lab Results  Component Value Date   TSH 2.00 02/06/2014    Radiographic Findings: No results found.  Impression/Plan:    1) Head and Neck Cancer Status: healing from RT  2) Nutritional Status: - weight: stabilizing  3)  Swallowing: functional  4) Dental: Encouraged to continue regular followup with dentistry, and dental hygiene per Dr Enrique Sack.  5) Thyroid function: recheck in next visit  6) Social: No active social issues to address at this time    7) Other: skin - silvadene given for moist desquamation behind ear.  Xerostomia - artificial saliva.  8) Follow-up in Mid April with CT of neck/ chest. The patient was encouraged to call with any issues or questions before then.  _____________________________________   Eppie Gibson, MD

## 2014-12-10 ENCOUNTER — Ambulatory Visit (INDEPENDENT_AMBULATORY_CARE_PROVIDER_SITE_OTHER): Payer: Medicare Other | Admitting: Cardiovascular Disease

## 2014-12-10 ENCOUNTER — Encounter: Payer: Self-pay | Admitting: Cardiovascular Disease

## 2014-12-10 ENCOUNTER — Telehealth: Payer: Self-pay | Admitting: *Deleted

## 2014-12-10 VITALS — BP 110/62 | HR 62 | Ht 69.0 in | Wt 148.0 lb

## 2014-12-10 DIAGNOSIS — F17201 Nicotine dependence, unspecified, in remission: Secondary | ICD-10-CM | POA: Diagnosis not present

## 2014-12-10 DIAGNOSIS — E785 Hyperlipidemia, unspecified: Secondary | ICD-10-CM | POA: Diagnosis not present

## 2014-12-10 DIAGNOSIS — I10 Essential (primary) hypertension: Secondary | ICD-10-CM

## 2014-12-10 DIAGNOSIS — I2510 Atherosclerotic heart disease of native coronary artery without angina pectoris: Secondary | ICD-10-CM | POA: Diagnosis not present

## 2014-12-10 NOTE — Telephone Encounter (Signed)
Patient called asking for clarification of "artificial saliva" Dr. Isidore Moos mentioned last week Friday.  I later followed up RadOnc secretary's call about Biotene, shared with him that a former patient found relief with OTC TheraBreath lozenges. He thanked me for information.  Gayleen Orem, RN, BSN, [place] [facility] [place] [phone]

## 2014-12-10 NOTE — Progress Notes (Signed)
History of Present Illness: 74 yo male with history of CAD, HTN, HLD, DM, CRI, AAA s/p repair, tobacco abuse, GERD, benign lung mass and Barretts esophagus here today for cardiac follow up. He has been followed in the past by Dr. Olevia Perches. In 1998 he had a bare metal stent placed in the right coronary artery. His last cardiac catheterization was in 2001 at which time he had nonobstructive coronary disease. He was seen by Dr. 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 Carotid artery dopplers were negative. He has been intolerant to several medications for his blood pressure in the past. He also has chronic renal insufficiency followed by Dr. Justin Mend. 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 Amedeo Plenty.  Stress myoview 09/19/12 with good exercise tolerance, no ischemia. Hospitalized May 2014 and June 2014 for weight loss, and workup of lung mass. Found to be benign. He has been going to [facility] for ablations of his Barretts esophagitis. He stopped smoking in 2015. 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  He tells me that he is feeling well. No chest pain or SOB. No palpitations. Tolerating all meds. He completed his XRT for parotid tumor in January 2016.  Primary Care Physician: Dr. Alain Marion.  Last Lipid Profile:Lipid Panel     Component Value Date/Time   CHOL 96 05/09/2014 1011   TRIG 183.0* 05/09/2014 1011   TRIG 135 10/13/2006 0754   HDL 9.80* 05/09/2014 1011   CHOLHDL 10 05/09/2014 1011   CHOLHDL 4.6 CALC 10/13/2006 0754   VLDL 36.6 05/09/2014 1011   LDLCALC 50 05/09/2014 1011     Past Medical History  Diagnosis Date  . Hypertension   . Anxiety   . COPD (chronic obstructive pulmonary disease)   . Hyperlipemia   . GERD (gastroesophageal reflux disease)   . Barrett's esophagus with high grade dysplasia     s/p RFA  . PVD (peripheral vascular disease)   . CAD (coronary artery disease)   . Tobacco user     . Depression   . Hiatal hernia   . Myocardial infarction 12/1996  . AAA (abdominal aortic aneurysm)   . Renal insufficiency 2010  . Bruises easily     on hands  . Pneumonia 06/22/14  . Type II diabetes mellitus   . 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 Cancer     PAROTID/LYMPH NODE CANCER LEFT SIDE  . History of radiation therapy 09/23/14-11/12/14    left neck/parotid bed 66 Gy 33 fx    Past Surgical History  Procedure Laterality Date  . Tonsillectomy    . Nissen fundoplication    . Abdominal aortic aneurysm repair  2010    and right common iliac artery aneurysm, DR HAYES  . Upper gastrointestinal endoscopy  07/07/2011    barretts esophagus, hiatal hernia, gastritis, ablations in esophagus  . Colonoscopy  05/07/2005    diverticulosis, internal and external hemorrhoids  . Radiofrequency ablation  multiple    Barrett's, high-grade dysplasia  . Colonoscopy N/A 03/01/2013    Procedure: COLONOSCOPY;  Surgeon: Gatha Mayer, MD;  Location: [facility];  Service: Endoscopy;  Laterality: N/A;  . Coronary angioplasty with stent placement  1998    "1"  . Parotidectomy w/ neck dissection total Left 08/13/2014    PARTIAL NECK DISSECTION  . Hernia repair    . Parotidectomy Left 08/13/2014  Procedure: PAROTIDECTOMY WITH LIMITED LEFT NECK DISSECTION;  Surgeon: Rozetta Nunnery, MD;  Location: ;  Service: ENT;  Laterality: Left;    Current Outpatient Prescriptions  Medication Sig Dispense Refill  . ALPRAZolam (XANAX) 0.5 MG tablet TAKE 1 TABLET TWICE A DAY AS NEEDED 60 tablet 3  . Alum & Mag Hydroxide-Simeth (MAGIC MOUTHWASH) SOLN Take 5 mLs by mouth.    Marland Kitchen aspirin EC 81 MG tablet Take 81 mg by mouth every morning.     . calcium-vitamin D (OSCAL WITH D) 500-200 MG-UNIT per tablet Take 1 tablet by mouth 3 (three) times daily.    . cholecalciferol (VITAMIN D) 1000 UNITS tablet Take 4,000 Units by mouth every morning.    . cloNIDine (CATAPRES) 0.1 MG tablet  Take 0.1 mg by mouth 2 (two) times daily.    Marland Kitchen docusate sodium (COLACE) 100 MG capsule Take 1 capsule (100 mg total) by mouth 2 (two) times daily as needed for mild constipation. 60 capsule 2  . FLUoxetine (PROZAC) 10 MG capsule Take 1 capsule (10 mg total) by mouth daily. 90 capsule 3  . gabapentin (NEURONTIN) 100 MG capsule Take 1 capsule (100 mg total) by mouth 2 (two) times daily. 180 capsule 3  . glimepiride (AMARYL) 1 MG tablet Take 1 tablet (1 mg total) by mouth 2 (two) times daily. 180 tablet 3  . glucose blood (ONE TOUCH TEST STRIPS) test strip Use as instructed 300 each 3  . HYDROcodone-acetaminophen (NORCO/VICODIN) 5-325 MG per tablet Take 1 tablet by mouth every 4 (four) hours as needed for moderate pain. 60 tablet 0  . iron polysaccharides (NIFEREX) 150 MG capsule Take 1 capsule (150 mg total) by mouth daily. 30 capsule 2  . labetalol (NORMODYNE) 200 MG tablet Take 1 tablet (200 mg total) by mouth 3 (three) times daily. 270 tablet 2  . lidocaine (XYLOCAINE) 2 % solution Patient: Mix 1part 2% viscous lidocaine, 1part H20. Swish and/or swallow 12mL of this mixture, 19min before meals and at bedtime, up to QID 100 mL 5  . Magnesium 300 MG CAPS Take 300 mg by mouth daily.    . mirtazapine (REMERON) 15 MG tablet Take 1-2 tablets (15-30 mg total) by mouth at bedtime as needed (for sleep). 90 tablet 3  . Multiple Vitamin (MULTIVITAMIN WITH MINERALS) TABS tablet Take 1 tablet by mouth every morning.    Marland Kitchen omeprazole (PRILOSEC) 40 MG capsule Take 1 capsule (40 mg total) by mouth 2 (two) times daily. 180 capsule 3  . ONETOUCH DELICA LANCETS FINE MISC     . sodium bicarbonate 650 MG tablet Take 1,300 mg by mouth every morning.     . Suvorexant (BELSOMRA) 20 MG TABS Take 20 mg by mouth at bedtime as needed. 30 tablet 5  . Umeclidinium-Vilanterol (ANORO ELLIPTA) 62.5-25 MCG/INH AEPB Inhale 1 puff into the lungs daily.     No current facility-administered medications for this visit.    Allergies    Allergen Reactions  . Amlodipine Besylate Swelling  . Benazepril Hcl Other (See Comments)    Elevates potassium levels  . Risperidone Other (See Comments)    loss of motor skills  . Tramadol Nausea And Vomiting  . Valsartan Other (See Comments)    Elevates potassium levels  . Penicillins Itching and Rash    History   Social History  . Marital Status: Married    Spouse Name: N/A    Number of Children: 3  . Years of Education: N/A   Occupational  History  . Retired Leisure centre manager    Social History Main Topics  . Smoking status: Former Smoker -- 1.00 packs/day for 58 years    Types: Cigarettes    Quit date: 05/09/2014  . Smokeless tobacco: Never Used     Comment: pt uses e-cigs  . 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 Drug Use: No  . Sexual Activity: Not Currently   Other Topics Concern  . Not on file   Social History Narrative   Regular Exercise -  NO    Family History  Problem Relation Age of Onset  . Coronary artery disease Other   . Diabetes Other   . Asthma Other   . Mental illness Mother     dementia  . Cancer Mother   . Diabetes Mother   . Heart disease Father   . Diabetes Father   . Stomach cancer Father   . Cancer Father   . Colon cancer Neg Hx   . Esophageal cancer Neg Hx   . Heart attack Neg Hx   . Stroke Neg Hx     Review of Systems:  As stated in the HPI and otherwise negative.   BP 110/62 mmHg  Pulse 62  Ht 5\' 9"  (1.753 m)  Wt 148 lb (67.132 kg)  BMI 21.85 kg/m2  SpO2 97%  Physical Examination: General: Well developed, well nourished, NAD HEENT: OP clear, mucus membranes moist SKIN: warm, dry. No rashes. Neuro: No focal deficits Musculoskeletal: Muscle strength 5/5 all ext Psychiatric: Mood and affect normal Neck: No JVD, no carotid bruits, no thyromegaly, no lymphadenopathy. Lungs:Clear bilaterally, no wheezes, rhonci, crackles Cardiovascular: Regular rate and rhythm. No murmurs, gallops or rubs. Abdomen:Soft. Bowel  sounds present. Non-tender.  Extremities: No lower extremity edema. Pulses are 2 + in the bilateral DP/PT.  Exercise stress myoview 09/19/12: Stress Procedure: The patient performed treadmill exercise using a Bruce Protocol for 10:11 minutes. The patient stopped due to sob, leg fatigue and denied any chest pain. There were non specific ST-T wave changes and sob. Technetium 25m Sestamibi was injected at peak exercise and myocardial perfusion imaging was performed after a brief delay.  Stress ECG: No significant change from baseline ECG  QPS  Raw Data Images: Patient motion noted; appropriate software correction applied.  Stress Images: Normal homogeneous uptake in all areas of the myocardium.  Rest Images: Normal homogeneous uptake in all areas of the myocardium.  Subtraction (SDS): No evidence of ischemia.  Transient Ischemic Dilatation (Normal <1.22): 1.00  Lung/Heart Ratio (Normal <0.45): 0.30  Quantitative Gated Spect Images  QGS EDV: 109 ml  QGS ESV: 48 ml  Impression  Exercise Capacity: Lexiscan with no exercise.  BP Response: Normal blood pressure response.  Clinical Symptoms: shortness of breath  ECG Impression: No significant ST segment change suggestive of ischemia.  Comparison with Prior Nuclear Study: No images to compare  Overall Impression: Normal stress nuclear study.  LV Ejection Fraction: 56%. LV Wall Motion: Normal Wall Motion.  Assessment and Plan:   1. CAD: Stable. Continue ASA, beta blocker, statin. Stress test November 2013 without ischemia.   2. HTN: BP is well controlled. Continue current therapy.    3. Tobacco abuse, in remission: He stopped smoking in 2015  4. Hyperlipidemia: He is on a statin. He is taking Atorvastatin. Lipids well controlled.

## 2014-12-10 NOTE — Patient Instructions (Signed)
Your physician wants you to follow-up in:  12 months.  You will receive a reminder letter in the mail two months in advance. If you don't receive a letter, please call our office to schedule the follow-up appointment.   

## 2014-12-17 ENCOUNTER — Telehealth: Payer: Self-pay | Admitting: Internal Medicine

## 2014-12-17 NOTE — Telephone Encounter (Signed)
Pt request to change from Suvorexant (BELSOMRA) 20 MG TABS (it is not working for him) to Temazepam to be [facility] to [facility] for 30 day supply.

## 2014-12-18 MED ORDER — TEMAZEPAM 30 MG PO CAPS: 30.0000 mg | ORAL_CAPSULE | Freq: Every evening | ORAL | Status: DC | PRN

## 2014-12-18 NOTE — Telephone Encounter (Signed)
Noted OK Thx 

## 2014-12-18 NOTE — Telephone Encounter (Signed)
Rx faxed to [facility] at [phone].

## 2015-01-07 ENCOUNTER — Telehealth: Payer: Self-pay | Admitting: Internal Medicine

## 2015-01-07 NOTE — Telephone Encounter (Signed)
Use Alprazolam only prn Thx

## 2015-01-07 NOTE — Telephone Encounter (Signed)
She was calling to advise of some things before his appt.   -she wanted to advise of sleeping medicine temazepam (RESTORIL) 30 MG capsule [[id number]] really "knocks him out". She is concerned that the medication might be too strong. Patient becomes unresponsive and looses the ability to function (walking, talking)  -patient is not eating properly. He drinks his boost drink, but little to nothing else. He states that his throat burns and it hurts to eat.

## 2015-01-07 NOTE — Telephone Encounter (Signed)
Called pt wife no answer LMOM with md response...[name]

## 2015-01-07 NOTE — Telephone Encounter (Signed)
D/c Temazepam Thx

## 2015-01-10 ENCOUNTER — Other Ambulatory Visit (INDEPENDENT_AMBULATORY_CARE_PROVIDER_SITE_OTHER): Payer: Medicare Other

## 2015-01-10 ENCOUNTER — Encounter: Payer: Self-pay | Admitting: Internal Medicine

## 2015-01-10 ENCOUNTER — Ambulatory Visit (INDEPENDENT_AMBULATORY_CARE_PROVIDER_SITE_OTHER): Payer: Medicare Other | Admitting: Internal Medicine

## 2015-01-10 VITALS — BP 110/80 | HR 72 | Temp 97.9°F | Ht 69.0 in | Wt 145.8 lb

## 2015-01-10 DIAGNOSIS — D2272 Melanocytic nevi of left lower limb, including hip: Secondary | ICD-10-CM | POA: Diagnosis not present

## 2015-01-10 DIAGNOSIS — R296 Repeated falls: Secondary | ICD-10-CM

## 2015-01-10 DIAGNOSIS — L820 Inflamed seborrheic keratosis: Secondary | ICD-10-CM | POA: Diagnosis not present

## 2015-01-10 DIAGNOSIS — C07 Malignant neoplasm of parotid gland: Secondary | ICD-10-CM

## 2015-01-10 DIAGNOSIS — F17200 Nicotine dependence, unspecified, uncomplicated: Secondary | ICD-10-CM

## 2015-01-10 DIAGNOSIS — D225 Melanocytic nevi of trunk: Secondary | ICD-10-CM | POA: Diagnosis not present

## 2015-01-10 DIAGNOSIS — L570 Actinic keratosis: Secondary | ICD-10-CM | POA: Diagnosis not present

## 2015-01-10 DIAGNOSIS — I951 Orthostatic hypotension: Secondary | ICD-10-CM

## 2015-01-10 DIAGNOSIS — Z720 Tobacco use: Secondary | ICD-10-CM

## 2015-01-10 DIAGNOSIS — D2271 Melanocytic nevi of right lower limb, including hip: Secondary | ICD-10-CM | POA: Diagnosis not present

## 2015-01-10 DIAGNOSIS — H6122 Impacted cerumen, left ear: Secondary | ICD-10-CM | POA: Diagnosis not present

## 2015-01-10 DIAGNOSIS — E785 Hyperlipidemia, unspecified: Secondary | ICD-10-CM | POA: Diagnosis not present

## 2015-01-10 DIAGNOSIS — H6120 Impacted cerumen, unspecified ear: Secondary | ICD-10-CM | POA: Insufficient documentation

## 2015-01-10 LAB — CBC WITH DIFFERENTIAL/PLATELET
Basophils Absolute: 0 10*3/uL (ref 0.0–0.1)
Basophils Relative: 0.4 % (ref 0.0–3.0)
Eosinophils Absolute: 0.3 10*3/uL (ref 0.0–0.7)
Eosinophils Relative: 3.6 % (ref 0.0–5.0)
HCT: 31.1 % — ABNORMAL LOW (ref 39.0–52.0)
Hemoglobin: 10.5 g/dL — ABNORMAL LOW (ref 13.0–17.0)
Lymphocytes Relative: 9.2 % — ABNORMAL LOW (ref 12.0–46.0)
Lymphs Abs: 0.8 10*3/uL (ref 0.7–4.0)
MCHC: 33.8 g/dL (ref 30.0–36.0)
MCV: 90.3 fl (ref 78.0–100.0)
MONOS PCT: 9 % (ref 3.0–12.0)
Monocytes Absolute: 0.8 10*3/uL (ref 0.1–1.0)
NEUTROS PCT: 77.8 % — AB (ref 43.0–77.0)
Neutro Abs: 6.9 10*3/uL (ref 1.4–7.7)
Platelets: 259 10*3/uL (ref 150.0–400.0)
RBC: 3.45 Mil/uL — ABNORMAL LOW (ref 4.22–5.81)
RDW: 14.8 % (ref 11.5–15.5)
WBC: 8.9 10*3/uL (ref 4.0–10.5)

## 2015-01-10 LAB — BASIC METABOLIC PANEL
BUN: 42 mg/dL — ABNORMAL HIGH (ref 6–23)
CALCIUM: 9.4 mg/dL (ref 8.4–10.5)
CHLORIDE: 111 meq/L (ref 96–112)
CO2: 20 mEq/L (ref 19–32)
Creatinine, Ser: 2.11 mg/dL — ABNORMAL HIGH (ref 0.40–1.50)
GFR: 32.83 mL/min — ABNORMAL LOW (ref >60.00)
GLUCOSE: 89 mg/dL (ref 70–99)
POTASSIUM: 3.3 meq/L — AB (ref 3.5–5.1)
Sodium: 139 mEq/L (ref 135–145)

## 2015-01-10 LAB — TSH: TSH: 1.87 u[IU]/mL (ref 0.35–4.50)

## 2015-01-10 LAB — HEMOGLOBIN A1C: Hgb A1c MFr Bld: 5.5 % (ref 4.6–6.5)

## 2015-01-10 MED ORDER — LABETALOL HCL 200 MG PO TABS: 100.0000 mg | ORAL_TABLET | Freq: Three times a day (TID) | ORAL | Status: AC

## 2015-01-10 NOTE — Progress Notes (Signed)
Subjective:   F/u wt loss, no appetite - better; parotid tumor - finished XRT x33, C/o dry mouth. He quit smoking in 7/15   HPI   F/u:  Renal failure - CRF COPD  DM (diabetes mellitus), type 2 with peripheral vascular complications  Normocytic anemia  Tremors of nervous system  Iron deficiency anemia  Skin Ca The patient also presents for a follow-up of  chronic hypertension, chronic dyslipidemia, type 2 diabetes, COPD, Barrett's, CAD controlled with medicines. He had Barrett's ablation x4, one more is due.     Review of Systems  Constitutional: Positive for unexpected weight change. Negative for appetite change and fatigue.  HENT: Negative for congestion, nosebleeds, sneezing and trouble swallowing.   Eyes: Negative for itching and visual disturbance.  Cardiovascular: Negative for palpitations and leg swelling.  Gastrointestinal: Positive for diarrhea. Negative for nausea, vomiting, constipation, blood in stool, abdominal distention and rectal pain.  Genitourinary: Negative for frequency and hematuria.  Musculoskeletal: Negative for back pain, joint swelling, gait problem and neck pain.  Neurological: Negative for dizziness, tremors, speech difficulty and weakness.  Psychiatric/Behavioral: Negative for sleep disturbance, dysphoric mood and agitation. The patient is not nervous/anxious.    Wt Readings from Last 3 Encounters:  01/10/15 145 lb 12 oz (66.112 kg)  12/10/14 148 lb (67.132 kg)  11/12/14 152 lb (68.947 kg)   BP Readings from Last 3 Encounters:  01/10/15 110/80  12/10/14 110/62  11/12/14 142/91        Objective:   Physical Exam  Constitutional: He is oriented to person, place, and time. He appears well-developed. No distress.  NAD  Eyes: Conjunctivae are normal. Pupils are equal, round, and reactive to light.  Neck: Normal range of motion. No JVD present. No thyromegaly present.  Cardiovascular: Normal rate, regular rhythm, normal heart sounds and  intact distal pulses.  Exam reveals no gallop and no friction rub.   No murmur heard. Pulmonary/Chest: Effort normal and breath sounds normal. No respiratory distress. He has no wheezes. He has no rales. He exhibits no tenderness.  Abdominal: Soft. Bowel sounds are normal. He exhibits no distension and no mass. There is no tenderness. There is no rebound and no guarding.  Musculoskeletal: Normal range of motion. He exhibits no edema or tenderness.  Lymphadenopathy:    He has no cervical adenopathy.  Neurological: He is alert and oriented to person, place, and time. He has normal reflexes. No cranial nerve deficit. He exhibits normal muscle tone. He displays a negative Romberg sign. Coordination and gait normal.  No meningeal signs  Skin: Skin is warm and dry. No rash noted.  Psychiatric: He has a normal mood and affect. His behavior is normal. Judgment and thought content normal.  AKs Not dyspneic Dry mouth Wax L ear    Lab Results  Component Value Date   WBC 9.9 08/09/2014   HGB 10.2* 08/09/2014   HCT 32.2* 08/09/2014   PLT 289 08/09/2014   GLUCOSE 83 10/10/2014   CHOL 96 05/09/2014   TRIG 183.0* 05/09/2014   HDL 9.80* 05/09/2014   LDLDIRECT 80.2 08/11/2012   LDLCALC 50 05/09/2014   ALT 47 08/09/2014   AST 31 08/09/2014   NA 137 10/10/2014   K 4.1 10/10/2014   CL 109 10/10/2014   CREATININE 2.3* 10/10/2014   BUN 46* 10/10/2014   CO2 20 10/10/2014   TSH 2.00 02/06/2014   PSA 0.99 04/17/2010   INR 0.99 08/01/2014   HGBA1C 6.0 10/10/2014     Procedure  Note :     Procedure :  Ear irrigation   Indication:  Cerumen impaction L   Risks, including pain, dizziness, eardrum perforation, bleeding, infection and others as well as benefits were explained to the patient in detail. Verbal consent was obtained and the patient agreed to proceed.    We used "The Elephant Ear Irrigation Device" filled with lukewarm water for irrigation. Procedure was aborted due to pain.    Postprocedure instructions :  Call if problems.            Assessment & Plan:

## 2015-01-10 NOTE — Assessment & Plan Note (Signed)
Resolved

## 2015-01-10 NOTE — Assessment & Plan Note (Signed)
Vaping only at present

## 2015-01-10 NOTE — Assessment & Plan Note (Addendum)
See procedure - aborted ENT consult

## 2015-01-10 NOTE — Assessment & Plan Note (Signed)
2015-16  XRT x33

## 2015-01-10 NOTE — Assessment & Plan Note (Signed)
Will reduce Normodyne

## 2015-01-10 NOTE — Progress Notes (Signed)
Pre visit review using our clinic review tool, if applicable. No additional management support is needed unless otherwise documented below in the visit note. 

## 2015-01-17 ENCOUNTER — Other Ambulatory Visit: Payer: Self-pay | Admitting: Geriatric Medicine

## 2015-01-17 MED ORDER — CLONIDINE HCL 0.1 MG PO TABS: 0.1000 mg | ORAL_TABLET | Freq: Two times a day (BID) | ORAL | Status: AC

## 2015-01-18 DIAGNOSIS — N183 Chronic kidney disease, stage 3 (moderate): Secondary | ICD-10-CM | POA: Diagnosis not present

## 2015-01-18 DIAGNOSIS — D631 Anemia in chronic kidney disease: Secondary | ICD-10-CM | POA: Diagnosis not present

## 2015-01-18 DIAGNOSIS — Z7689 Persons encountering health services in other specified circumstances: Secondary | ICD-10-CM | POA: Diagnosis not present

## 2015-01-22 ENCOUNTER — Telehealth: Payer: Self-pay | Admitting: Internal Medicine

## 2015-01-22 MED ORDER — MIRTAZAPINE 15 MG PO TABS: 15.0000 mg | ORAL_TABLET | Freq: Every evening | ORAL | Status: AC | PRN

## 2015-01-22 NOTE — Telephone Encounter (Signed)
Requesting refill of mirtazapine (REMERON) 15 MG tablet [[id number]]

## 2015-01-22 NOTE — Telephone Encounter (Signed)
Approved, pharmacy to notify

## 2015-01-25 ENCOUNTER — Telehealth: Payer: Self-pay

## 2015-01-25 NOTE — Telephone Encounter (Signed)
Patient called and has requested a return call from Dr.Squire, on inquiry I asked if I could assist him, he requested that all of his appointments be changed from [facility] to [place].Patient states he had left a message earlier today.Informed him Dr.Squire's schedule was busy today but I am sure she will have our office take care of his request or guide [place] on return contact.Patient thankful for addressing needs.

## 2015-01-28 ENCOUNTER — Telehealth: Payer: Self-pay | Admitting: *Deleted

## 2015-01-28 NOTE — Telephone Encounter (Signed)
Called patient to inform of test and fu being moved 02-01-15 (test) and fu visit (02/08/15), spoke with patient and he is aware of these appt. changes

## 2015-01-29 ENCOUNTER — Telehealth: Payer: Self-pay | Admitting: Pulmonary Disease

## 2015-01-29 NOTE — Telephone Encounter (Signed)
Called and vanda was not available. WCB

## 2015-01-29 NOTE — Telephone Encounter (Signed)
Absolutely, as long as I get a report

## 2015-01-29 NOTE — Telephone Encounter (Signed)
Pt is scheduled for CT wo contrast per Dr Gwenette Greet and also Dr Isidore Moos has ordered a CT neck and chest wo contrast.  They want to know if they can cancel the one [facility] has scheduled and CC him in Dr Pearlie Oyster report.  Please advise

## 2015-01-30 NOTE — Telephone Encounter (Signed)
Called CT, spoke with Tedra Coupe, verified that [facility] order can cancelled as long as he is CC'd on Dr Lanell Persons' order/results.  Nothing further needed.

## 2015-02-01 ENCOUNTER — Ambulatory Visit (HOSPITAL_COMMUNITY)
Admission: RE | Admit: 2015-02-01 | Discharge: 2015-02-01 | Disposition: A | Discharge status: Home / Self Care | Payer: Medicare Other | Source: Ambulatory Visit | Attending: Pulmonary Disease | Admitting: Pulmonary Disease

## 2015-02-01 ENCOUNTER — Ambulatory Visit (HOSPITAL_COMMUNITY): Payer: Medicare Other

## 2015-02-01 ENCOUNTER — Ambulatory Visit (HOSPITAL_COMMUNITY)
Admission: RE | Admit: 2015-02-01 | Discharge: 2015-02-01 | Disposition: A | Discharge status: Home / Self Care | Payer: Medicare Other | Source: Ambulatory Visit | Attending: Radiation Oncology | Admitting: Radiation Oncology

## 2015-02-01 DIAGNOSIS — C44300 Unspecified malignant neoplasm of skin of unspecified part of face: Secondary | ICD-10-CM

## 2015-02-01 DIAGNOSIS — C770 Secondary and unspecified malignant neoplasm of lymph nodes of head, face and neck: Secondary | ICD-10-CM | POA: Insufficient documentation

## 2015-02-01 DIAGNOSIS — J690 Pneumonitis due to inhalation of food and vomit: Secondary | ICD-10-CM

## 2015-02-01 DIAGNOSIS — C44310 Basal cell carcinoma of skin of unspecified parts of face: Secondary | ICD-10-CM | POA: Diagnosis not present

## 2015-02-01 DIAGNOSIS — I700 Atherosclerosis of aorta: Secondary | ICD-10-CM | POA: Diagnosis not present

## 2015-02-01 DIAGNOSIS — Z923 Personal history of irradiation: Secondary | ICD-10-CM | POA: Diagnosis not present

## 2015-02-01 DIAGNOSIS — R918 Other nonspecific abnormal finding of lung field: Secondary | ICD-10-CM

## 2015-02-01 DIAGNOSIS — C4442 Squamous cell carcinoma of skin of scalp and neck: Secondary | ICD-10-CM | POA: Diagnosis not present

## 2015-02-08 ENCOUNTER — Encounter: Payer: Self-pay | Admitting: Radiation Oncology

## 2015-02-08 ENCOUNTER — Ambulatory Visit
Admission: RE | Admit: 2015-02-08 | Discharge: 2015-02-08 | Disposition: A | Discharge status: Home / Self Care | Payer: Non-veteran care | Source: Ambulatory Visit | Attending: Radiation Oncology | Admitting: Radiation Oncology

## 2015-02-08 VITALS — BP 141/71 | HR 68 | Temp 97.9°F | Resp 18 | Wt 144.0 lb

## 2015-02-08 DIAGNOSIS — C44300 Unspecified malignant neoplasm of skin of unspecified part of face: Secondary | ICD-10-CM

## 2015-02-08 NOTE — Progress Notes (Signed)
Radiation Oncology         [phone] ________________________________  Name: [name] MRN: [id number]  Date: 02/08/2015  DOB: [birth year]  Follow-Up Visit Note  CC: Walker Kehr, MD  Rozetta Nunnery, *  Diagnosis and Prior Radiotherapy:       ICD-9-CM ICD-10-CM   1. 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   Pain Status: none  Weight changes, if any: lost 6 lb 13 oz in past 2 months  Nutritional Status a) intake: regular diet, no chewing/swallowing issues b) using a feeding tube?: na c) weight changes, if any:   Swallowing Status: normal, dry mouth  Dental (if applicable): When was last visit with dentistry  Prior to tx, does not see dentist regularly  Using fluoride trays daily? na   When was last ENT visit? 11/29/14, thinks he has seen Dr Lucia Gaskins since Jan but cannot remember for certain   When is next ENT visit? 2015-04-26  Other notable issues, if any: loss of appetite, dry mouth are his only issues today             ALLERGIES:  is allergic to amlodipine besylate; benazepril hcl; risperidone; tramadol; valsartan; and penicillins.  Meds: Current Outpatient Prescriptions  Medication Sig Dispense Refill  . ALPRAZolam (XANAX) 0.5 MG tablet TAKE 1 TABLET TWICE A DAY AS NEEDED 60 tablet 3  . aspirin EC 81 MG tablet Take 81 mg by mouth every morning.     Marland Kitchen atorvastatin (LIPITOR) 10 MG tablet Take 10 mg by mouth daily. Dose unknown. Ordered by primary care    . calcium-vitamin D (OSCAL WITH D) 500-200 MG-UNIT per tablet Take 1 tablet by mouth 3 (three) times daily.    . cholecalciferol  (VITAMIN D) 1000 UNITS tablet Take 4,000 Units by mouth every morning.    . cloNIDine (CATAPRES) 0.1 MG tablet Take 1 tablet (0.1 mg total) by mouth 2 (two) times daily. 60 tablet 3  . FLUoxetine (PROZAC) 10 MG capsule Take 1 capsule (10 mg total) by mouth daily. 90 capsule 3  . gabapentin (NEURONTIN) 100 MG capsule Take 1 capsule (100 mg total) by mouth 2 (two) times daily. 180 capsule 3  . glimepiride (AMARYL) 1 MG tablet Take 1 tablet (1 mg total) by mouth 2 (two) times daily. 180 tablet 3  . glucose blood (ONE TOUCH TEST STRIPS) test strip Use as instructed 300 each 3  . iron polysaccharides (NIFEREX) 150 MG capsule Take 1 capsule (150 mg total) by mouth daily. 30 capsule 2  . labetalol (NORMODYNE) 200 MG tablet Take 0.5 tablets (100 mg total) by mouth 3 (three) times daily. 270 tablet 2  . Magnesium 300 MG CAPS Take 300 mg by mouth daily.    . mirtazapine (REMERON) 15 MG tablet Take 1-2 tablets (15-30 mg total) by mouth at bedtime as needed (for sleep). 90 tablet 3  . Multiple Vitamin (MULTIVITAMIN WITH MINERALS) TABS tablet Take 1 tablet by mouth every morning.    Marland Kitchen omeprazole (PRILOSEC) 40 MG capsule Take 1 capsule (40 mg total) by mouth 2 (two) times daily. 180 capsule  3  . ONETOUCH DELICA LANCETS FINE MISC     . sodium bicarbonate 650 MG tablet Take 1,300 mg by mouth every morning.     . temazepam (RESTORIL) 30 MG capsule     . Umeclidinium-Vilanterol (ANORO ELLIPTA) 62.5-25 MCG/INH AEPB Inhale 1 puff into the lungs daily.     No current facility-administered medications for this encounter.    Physical Findings: Filed Vitals:   02/08/15 1430  BP: 141/71  Pulse: 68  Temp: 97.9 F (36.6 C)  Resp: 18   Wt Readings from Last 3 Encounters:  01/10/15 145 lb 12 oz (66.112 kg)  12/10/14 148 lb (67.132 kg)  11/12/14 152 lb (68.947 kg)  General: Alert and oriented, in no acute distress HEENT: Head is normocephalic. Extraocular movements are intact. Oropharynx is clear. Neck: Neck is  supple, no palpable cervical or supraclavicular lymphadenopathy. Heart: Regular in rate and rhythm with no murmurs, rubs, or gallops. Chest: Clear to auscultation bilaterally, with no rhonchi, wheezes, or rales. Abdomen: Soft, nontender, nondistended, with no rigidity or guarding. Lymphatics: see Neck Exam Skin: dry over L face and neck  Neurologic:asymmetric eyebrows, pt reports stable, subacute since surgery Psychiatric: Judgment and insight are intact. Affect is appropriate.     Lab Findings: Lab Results  Component Value Date   WBC 8.9 01/10/2015   HGB 10.5* 01/10/2015   HCT 31.1* 01/10/2015   MCV 90.3 01/10/2015   PLT 259.0 01/10/2015    Lab Results  Component Value Date   TSH 1.87 01/10/2015    Radiographic Findings: Ct Soft Tissue Neck Wo Contrast  02/01/2015   CLINICAL DATA:  Squamous cell carcinoma of the left temporal skin with metastatic disease to parotid lymph nodes. Postop resection and radiation.  EXAM: CT NECK WITHOUT CONTRAST  TECHNIQUE: Multidetector CT imaging of the neck was performed following the standard protocol without intravenous contrast.  COMPARISON:  CT neck 07/18/2014, PET-CT 09/07/2014  FINDINGS: Postop parotidectomy on the left. Soft tissue thickening is present in the subcutaneous tissues with a nodular appearance. This may represent present scarring however recurrent tumor in the surgical bed not excluded. Continued follow-up recommended. Intravenous contrast not given due to renal insufficiency.  Mild thickening of the platysmas muscle on the left. Mild thickening of the sternocleidomastoid muscle and surrounding soft tissues on the left consistent with prior radiation change. No enlarged jugular lymph nodes on the left.  No adenopathy on the right.  Right parotid gland is fatty replaced and normal. Submandibular gland fatty replaced and normal bilaterally.  The pharynx is normal and symmetric.  Larynx is normal.  Thyroid normal in size with small nodules  bilaterally.  Right upper lobe lesion.  See separate CT chest report from today.  Mild cervical degenerative change.  No focal bony lesion.  IMPRESSION: Postop left parotidectomy. Nodular soft tissue thickening in the surgical bed extending to the skin surface likely represent scarring however recurrent tumor cannot be excluded. Continued close clinical and imaging follow-up is warranted. No jugular enlarged lymph nodes.  Right upper lobe lesion.  See separate CT chest report from today.   Electronically Signed   By: Franchot Gallo M.D.   On: 02/01/2015 14:54   Ct Chest Wo Contrast  02/01/2015   CLINICAL DATA:  Restaging head neck cancer postradiation therapy completion 5 months ago. Previous neck dissection. Follow-up right upper lobe chest wall mass. Subsequent encounter.  EXAM: CT CHEST WITHOUT CONTRAST  TECHNIQUE: Multidetector CT imaging of the chest was performed following the standard protocol  without IV contrast.  COMPARISON:  PET-CT 09/07/2014.  Chest CT 04/29/2014 and 03/14/2013.  FINDINGS: Neck findings are dictated separately.  Mediastinum/Nodes: There are no enlarged mediastinal, hilar or axillary lymph nodes. There is a stable small hiatal hernia. The thyroid gland appears unremarkable. The heart size is normal. There is no pericardial effusion.Diffuse atherosclerosis of the aorta, great vessels and coronary arteries again noted.  Lungs/Pleura: There is no pleural effusion. The previously demonstrated right middle lobe process has nearly completely resolved with minimal residual scarring in this area. The irregular subpleural lesion posteriorly in the right upper lobe appears slightly less dense and smaller, measuring approximately 1.9 x 1.4 cm on image 16. This is significantly smaller than on the 2014 CT. This was only mildly hypermetabolic on PET-CT. No new or enlarging nodules demonstrated. The left lung is clear. Mild emphysematous changes are present.  Upper abdomen: Stable appearance. Aortic  stent graft and marked left renal cortical thinning noted. No suspicious adrenal findings.  Musculoskeletal/Chest wall: Stable asymmetric gynecomastia on the left. No suspicious chest wall lesion or osseous lesion.  IMPRESSION: 1. Further improvement in presumed inflammatory lesions within the right upper and middle lobes. There is a residual stellate density in the right upper lobe which is much smaller than on the baseline examination from 2014 and only slightly hypermetabolic on more recent PET. 2. No evidence of thoracic metastatic disease. 3. Diffuse atherosclerosis.   Electronically Signed   By: Richardean Sale M.D.   On: 02/01/2015 15:07    Impression/Plan:    1) Head and Neck Cancer Status: NED  2) Nutritional Status: - weight: a little decreased but pt reports this is due to past anxiety that has now lifted after scans.  3)  Swallowing: functional  4) Dental: I asked Dr Enrique Sack to refer to community dentist  5) Thyroid function: normal  6) Social: No active social issues to address at this time    7) Other: skin - dry, lotion encouraged.  Xerostomia - artificial saliva.  8) Follow-up in 17mo with repeat CT of neck .The patient was encouraged to call with any issues or questions before then.  _____________________________________   Eppie Gibson, MD

## 2015-02-08 NOTE — Progress Notes (Signed)
Pain Status: none  Weight changes, if any: lost 6 lb 13 oz in past 2 months  Nutritional Status a) intake: regular diet, no chewing/swallowing issues b) using a feeding tube?: na c) weight changes, if any:   Swallowing Status: normal, dry mouth  Dental (if applicable): When was last visit with dentistry  Prior to tx, does not see dentist regularly  Using fluoride trays daily? na   When was last ENT visit? 11/29/14, thinks he has seen Dr Lucia Gaskins since Jan but cannot remember for certain   When is next ENT visit? Apr 29, 2015  Other notable issues, if any: loss of appetite, dry mouth are his only issues today

## 2015-02-11 ENCOUNTER — Telehealth: Payer: Self-pay | Admitting: *Deleted

## 2015-02-11 NOTE — Telephone Encounter (Signed)
CALLED PATIENT TO INFORM OF LAB BEING TO 06-13-15 @ 10 AM AND HIS CT TO FOLLOW ON 06-13-15 @ 11 AM @ [facility], SPOKE WITH PATIENT AND HE IS AWARE OF THESE APPTS.

## 2015-02-11 NOTE — Telephone Encounter (Signed)
CALLED PATIENT TO INFORM OF LAB AND FU, LVM FOR A RETURN CALL

## 2015-02-13 ENCOUNTER — Telehealth (HOSPITAL_COMMUNITY): Payer: Self-pay

## 2015-02-13 NOTE — Telephone Encounter (Signed)
02/12/15                 F/U call to see if patient had scheduled an appt. with a Dental Office of choice for further dental treatment.  Patient stated he will at a later date and will call Dental Medicine with information.  LRI

## 2015-02-21 ENCOUNTER — Telehealth: Payer: Self-pay | Admitting: Internal Medicine

## 2015-02-21 ENCOUNTER — Ambulatory Visit (HOSPITAL_COMMUNITY): Payer: Medicare Other

## 2015-02-21 ENCOUNTER — Ambulatory Visit: Payer: Non-veteran care

## 2015-02-21 NOTE — Telephone Encounter (Signed)
Pt called in and said that he has had radiation all month and now he cant eat.  He has not been able to eatting lose 22 pounds .  What should he do?     Best number [phone]

## 2015-02-22 ENCOUNTER — Ambulatory Visit: Payer: Non-veteran care | Admitting: Radiation Oncology

## 2015-02-22 NOTE — Telephone Encounter (Signed)
Try Ensure Enlive Does he need to be seen? Please call your Radiation Oncology doctor for instructions Thx

## 2015-02-22 NOTE — Telephone Encounter (Signed)
Pt return call back gave him md response...[name]

## 2015-02-22 NOTE — Telephone Encounter (Signed)
Called pt no answer LMOM RTC.../lmb 

## 2015-02-27 ENCOUNTER — Ambulatory Visit: Payer: Non-veteran care | Admitting: Radiation Oncology

## 2015-03-06 DIAGNOSIS — C770 Secondary and unspecified malignant neoplasm of lymph nodes of head, face and neck: Secondary | ICD-10-CM | POA: Diagnosis not present

## 2015-03-06 DIAGNOSIS — C07 Malignant neoplasm of parotid gland: Secondary | ICD-10-CM | POA: Diagnosis not present

## 2015-03-13 ENCOUNTER — Encounter (HOSPITAL_BASED_OUTPATIENT_CLINIC_OR_DEPARTMENT_OTHER): Payer: Medicare Other | Attending: Surgery

## 2015-03-13 DIAGNOSIS — K029 Dental caries, unspecified: Secondary | ICD-10-CM | POA: Diagnosis not present

## 2015-03-13 DIAGNOSIS — Z880 Allergy status to penicillin: Secondary | ICD-10-CM | POA: Insufficient documentation

## 2015-03-13 DIAGNOSIS — I739 Peripheral vascular disease, unspecified: Secondary | ICD-10-CM | POA: Diagnosis not present

## 2015-03-13 DIAGNOSIS — Z85828 Personal history of other malignant neoplasm of skin: Secondary | ICD-10-CM | POA: Diagnosis not present

## 2015-03-13 DIAGNOSIS — Z87891 Personal history of nicotine dependence: Secondary | ICD-10-CM | POA: Insufficient documentation

## 2015-03-13 DIAGNOSIS — M278 Other specified diseases of jaws: Secondary | ICD-10-CM | POA: Diagnosis not present

## 2015-03-13 DIAGNOSIS — N183 Chronic kidney disease, stage 3 (moderate): Secondary | ICD-10-CM | POA: Diagnosis not present

## 2015-03-13 DIAGNOSIS — Y842 Radiological procedure and radiotherapy as the cause of abnormal reaction of the patient, or of later complication, without mention of misadventure at the time of the procedure: Secondary | ICD-10-CM | POA: Insufficient documentation

## 2015-03-13 DIAGNOSIS — L598 Other specified disorders of the skin and subcutaneous tissue related to radiation: Secondary | ICD-10-CM | POA: Diagnosis not present

## 2015-03-13 DIAGNOSIS — Z8589 Personal history of malignant neoplasm of other organs and systems: Secondary | ICD-10-CM | POA: Diagnosis not present

## 2015-03-13 DIAGNOSIS — M272 Inflammatory conditions of jaws: Secondary | ICD-10-CM | POA: Insufficient documentation

## 2015-03-13 DIAGNOSIS — J449 Chronic obstructive pulmonary disease, unspecified: Secondary | ICD-10-CM | POA: Diagnosis not present

## 2015-03-13 DIAGNOSIS — I129 Hypertensive chronic kidney disease with stage 1 through stage 4 chronic kidney disease, or unspecified chronic kidney disease: Secondary | ICD-10-CM | POA: Diagnosis not present

## 2015-03-13 DIAGNOSIS — E1122 Type 2 diabetes mellitus with diabetic chronic kidney disease: Secondary | ICD-10-CM | POA: Diagnosis not present

## 2015-03-13 DIAGNOSIS — K027 Dental root caries: Secondary | ICD-10-CM | POA: Diagnosis not present

## 2015-03-13 DIAGNOSIS — L599 Disorder of the skin and subcutaneous tissue related to radiation, unspecified: Secondary | ICD-10-CM | POA: Diagnosis not present

## 2015-03-13 DIAGNOSIS — I252 Old myocardial infarction: Secondary | ICD-10-CM | POA: Insufficient documentation

## 2015-03-14 ENCOUNTER — Telehealth: Payer: Self-pay | Admitting: Cardiovascular Disease

## 2015-03-14 NOTE — Telephone Encounter (Signed)
New Message      Office calling stating that Dr. Con Memos wanting pt to have hyperbaric oxygen for a non healing wound and wants medical clearance from Dr. Angelena Form. Please call back and advise.

## 2015-03-15 NOTE — Telephone Encounter (Signed)
Hyperbaric oxygen therapy will be ok from the standpoint of his heart. Darlina Guys

## 2015-03-15 NOTE — Telephone Encounter (Signed)
I spoke with Shanon Brow at wound care center and gave him information from Dr. Angelena Form

## 2015-03-18 ENCOUNTER — Encounter (HOSPITAL_COMMUNITY): Payer: Self-pay

## 2015-03-18 ENCOUNTER — Observation Stay (HOSPITAL_COMMUNITY)
Admission: EM | Admit: 2015-03-18 | Discharge: 2015-03-20 | Disposition: A | Discharge status: Home / Self Care | Payer: Medicare Other | Attending: Internal Medicine | Admitting: Internal Medicine

## 2015-03-18 ENCOUNTER — Emergency Department (HOSPITAL_COMMUNITY): Payer: Medicare Other

## 2015-03-18 DIAGNOSIS — Z87891 Personal history of nicotine dependence: Secondary | ICD-10-CM | POA: Diagnosis not present

## 2015-03-18 DIAGNOSIS — N183 Chronic kidney disease, stage 3 unspecified: Secondary | ICD-10-CM | POA: Diagnosis present

## 2015-03-18 DIAGNOSIS — E1151 Type 2 diabetes mellitus with diabetic peripheral angiopathy without gangrene: Secondary | ICD-10-CM | POA: Diagnosis present

## 2015-03-18 DIAGNOSIS — I10 Essential (primary) hypertension: Secondary | ICD-10-CM | POA: Diagnosis not present

## 2015-03-18 DIAGNOSIS — I6522 Occlusion and stenosis of left carotid artery: Secondary | ICD-10-CM

## 2015-03-18 DIAGNOSIS — Z8679 Personal history of other diseases of the circulatory system: Secondary | ICD-10-CM | POA: Diagnosis not present

## 2015-03-18 DIAGNOSIS — R404 Transient alteration of awareness: Secondary | ICD-10-CM | POA: Diagnosis not present

## 2015-03-18 DIAGNOSIS — F419 Anxiety disorder, unspecified: Secondary | ICD-10-CM | POA: Insufficient documentation

## 2015-03-18 DIAGNOSIS — J439 Emphysema, unspecified: Secondary | ICD-10-CM | POA: Diagnosis not present

## 2015-03-18 DIAGNOSIS — Z955 Presence of coronary angioplasty implant and graft: Secondary | ICD-10-CM | POA: Diagnosis not present

## 2015-03-18 DIAGNOSIS — I6529 Occlusion and stenosis of unspecified carotid artery: Secondary | ICD-10-CM | POA: Insufficient documentation

## 2015-03-18 DIAGNOSIS — Z716 Tobacco abuse counseling: Secondary | ICD-10-CM | POA: Diagnosis not present

## 2015-03-18 DIAGNOSIS — I2510 Atherosclerotic heart disease of native coronary artery without angina pectoris: Secondary | ICD-10-CM | POA: Insufficient documentation

## 2015-03-18 DIAGNOSIS — R29898 Other symptoms and signs involving the musculoskeletal system: Secondary | ICD-10-CM | POA: Diagnosis not present

## 2015-03-18 DIAGNOSIS — J449 Chronic obstructive pulmonary disease, unspecified: Secondary | ICD-10-CM | POA: Insufficient documentation

## 2015-03-18 DIAGNOSIS — I252 Old myocardial infarction: Secondary | ICD-10-CM | POA: Insufficient documentation

## 2015-03-18 DIAGNOSIS — E119 Type 2 diabetes mellitus without complications: Secondary | ICD-10-CM | POA: Insufficient documentation

## 2015-03-18 DIAGNOSIS — G459 Transient cerebral ischemic attack, unspecified: Principal | ICD-10-CM

## 2015-03-18 DIAGNOSIS — C07 Malignant neoplasm of parotid gland: Secondary | ICD-10-CM | POA: Diagnosis not present

## 2015-03-18 DIAGNOSIS — Z923 Personal history of irradiation: Secondary | ICD-10-CM | POA: Insufficient documentation

## 2015-03-18 DIAGNOSIS — Z880 Allergy status to penicillin: Secondary | ICD-10-CM | POA: Diagnosis not present

## 2015-03-18 DIAGNOSIS — F329 Major depressive disorder, single episode, unspecified: Secondary | ICD-10-CM | POA: Diagnosis not present

## 2015-03-18 DIAGNOSIS — E785 Hyperlipidemia, unspecified: Secondary | ICD-10-CM | POA: Insufficient documentation

## 2015-03-18 DIAGNOSIS — E876 Hypokalemia: Secondary | ICD-10-CM | POA: Diagnosis not present

## 2015-03-18 DIAGNOSIS — Z888 Allergy status to other drugs, medicaments and biological substances status: Secondary | ICD-10-CM | POA: Insufficient documentation

## 2015-03-18 DIAGNOSIS — K219 Gastro-esophageal reflux disease without esophagitis: Secondary | ICD-10-CM | POA: Insufficient documentation

## 2015-03-18 DIAGNOSIS — Z8701 Personal history of pneumonia (recurrent): Secondary | ICD-10-CM | POA: Insufficient documentation

## 2015-03-18 DIAGNOSIS — Z7982 Long term (current) use of aspirin: Secondary | ICD-10-CM | POA: Diagnosis not present

## 2015-03-18 DIAGNOSIS — I129 Hypertensive chronic kidney disease with stage 1 through stage 4 chronic kidney disease, or unspecified chronic kidney disease: Secondary | ICD-10-CM | POA: Insufficient documentation

## 2015-03-18 DIAGNOSIS — R531 Weakness: Secondary | ICD-10-CM | POA: Diagnosis not present

## 2015-03-18 DIAGNOSIS — E1159 Type 2 diabetes mellitus with other circulatory complications: Secondary | ICD-10-CM

## 2015-03-18 DIAGNOSIS — I739 Peripheral vascular disease, unspecified: Secondary | ICD-10-CM | POA: Diagnosis not present

## 2015-03-18 LAB — CBC
HCT: 33.7 % — ABNORMAL LOW (ref 39.0–52.0)
HEMOGLOBIN: 11.1 g/dL — AB (ref 13.0–17.0)
MCH: 30.7 pg (ref 26.0–34.0)
MCHC: 32.9 g/dL (ref 30.0–36.0)
MCV: 93.4 fL (ref 78.0–100.0)
PLATELETS: 204 10*3/uL (ref 150–400)
RBC: 3.61 MIL/uL — ABNORMAL LOW (ref 4.22–5.81)
RDW: 14.7 % (ref 11.5–15.5)
WBC: 14.6 10*3/uL — ABNORMAL HIGH (ref 4.0–10.5)

## 2015-03-18 LAB — URINALYSIS, ROUTINE W REFLEX MICROSCOPIC
Bilirubin Urine: NEGATIVE
Glucose, UA: NEGATIVE mg/dL
Hgb urine dipstick: NEGATIVE
Ketones, ur: NEGATIVE mg/dL
Leukocytes, UA: NEGATIVE
NITRITE: NEGATIVE
PH: 5.5 (ref 5.0–8.0)
Protein, ur: NEGATIVE mg/dL
Specific Gravity, Urine: 1.012 (ref 1.005–1.030)
UROBILINOGEN UA: 0.2 mg/dL (ref 0.0–1.0)

## 2015-03-18 LAB — I-STAT CHEM 8, ED
BUN: 58 mg/dL — AB (ref 6–20)
CALCIUM ION: 1.28 mmol/L (ref 1.13–1.30)
CHLORIDE: 111 mmol/L (ref 101–111)
Creatinine, Ser: 2.7 mg/dL — ABNORMAL HIGH (ref 0.61–1.24)
Glucose, Bld: 66 mg/dL (ref 65–99)
HEMATOCRIT: 36 % — AB (ref 39.0–52.0)
Hemoglobin: 12.2 g/dL — ABNORMAL LOW (ref 13.0–17.0)
POTASSIUM: 3 mmol/L — AB (ref 3.5–5.1)
Sodium: 142 mmol/L (ref 135–145)
TCO2: 13 mmol/L (ref 0–100)

## 2015-03-18 LAB — RAPID URINE DRUG SCREEN, HOSP PERFORMED
AMPHETAMINES: NOT DETECTED
Barbiturates: NOT DETECTED
Benzodiazepines: POSITIVE — AB
Cocaine: NOT DETECTED
OPIATES: NOT DETECTED
Tetrahydrocannabinol: NOT DETECTED

## 2015-03-18 LAB — DIFFERENTIAL
BASOS PCT: 0 % (ref 0–1)
Basophils Absolute: 0 10*3/uL (ref 0.0–0.1)
Eosinophils Absolute: 0.5 10*3/uL (ref 0.0–0.7)
Eosinophils Relative: 3 % (ref 0–5)
LYMPHS PCT: 8 % — AB (ref 12–46)
Lymphs Abs: 1.1 10*3/uL (ref 0.7–4.0)
MONOS PCT: 7 % (ref 3–12)
Monocytes Absolute: 0.9 10*3/uL (ref 0.1–1.0)
NEUTROS PCT: 82 % — AB (ref 43–77)
Neutro Abs: 12 10*3/uL — ABNORMAL HIGH (ref 1.7–7.7)

## 2015-03-18 LAB — COMPREHENSIVE METABOLIC PANEL
ALT: 32 U/L (ref 17–63)
ANION GAP: 9 (ref 5–15)
AST: 24 U/L (ref 15–41)
Albumin: 4.2 g/dL (ref 3.5–5.0)
Alkaline Phosphatase: 115 U/L (ref 38–126)
BUN: 63 mg/dL — AB (ref 6–20)
CO2: 16 mmol/L — AB (ref 22–32)
CREATININE: 2.42 mg/dL — AB (ref 0.61–1.24)
Calcium: 9 mg/dL (ref 8.9–10.3)
Chloride: 112 mmol/L — ABNORMAL HIGH (ref 101–111)
GFR, EST AFRICAN AMERICAN: 29 mL/min — AB (ref >60)
GFR, EST NON AFRICAN AMERICAN: 25 mL/min — AB (ref >60)
GLUCOSE: 71 mg/dL (ref 65–99)
Potassium: 2.9 mmol/L — ABNORMAL LOW (ref 3.5–5.1)
Sodium: 137 mmol/L (ref 135–145)
TOTAL PROTEIN: 8 g/dL (ref 6.5–8.1)
Total Bilirubin: 0.5 mg/dL (ref 0.3–1.2)

## 2015-03-18 LAB — PROTIME-INR
INR: 1.12 (ref 0.00–1.49)
PROTHROMBIN TIME: 14.6 s (ref 11.6–15.2)

## 2015-03-18 LAB — APTT: APTT: 33 s (ref 24–37)

## 2015-03-18 LAB — ETHANOL

## 2015-03-18 LAB — I-STAT TROPONIN, ED: Troponin i, poc: 0 ng/mL (ref 0.00–0.08)

## 2015-03-18 NOTE — H&P (Signed)
[facility] History and Physical  [name] KXF:[id number] DOB: [birth year] DOA: 03/18/2015  Referring physician: Noemi Chapel, MD PCP: Walker Kehr, MD   Chief Complaint: TIA  HPI: [name] is a 74 y.o. male with hisotry of CAD COPD HTN Hyperlipidemia saliva gland cancer and DM presents with TIA. Patient woke up this morningn and noted his leg to be bothering him. He staes that he could not put any weight on the right leg. Patient notes that it got better but only for short while but then he started to feel it go weak and he could not lift the leg. He states that he did not note any numbness. He denies having any dizziness and he denies having any headache. He had no chest pain noted. Patient also noted that he was not able to express himself and had garbled speech which then resolved. Later in the evening his symptoms appeared again and this time he was not able to get up at all. His wife called EMS. In the ED now he states he could probably get up and dance. He states his symptoms have resolved.   Review of Systems:  Constitutional:  No weight loss, night sweats, Fevers, chills, +fatigue.  HEENT:  No headaches, itching, ear ache, nasal congestion, post nasal drip,  Cardio-vascular:  No chest pain, PND, swelling in lower extremities dizziness  GI:  No heartburn, indigestion, abdominal pain, nausea, vomiting, diarrhea  Resp:  No shortness of breath with exertion or at rest. No coughing up of blood.No change in color of mucus.No wheezing Skin:  no rash or lesion GU:  no dysuria, change in color of urine, no urgency or frequency Musculoskeletal:  No joint pain or swelling. No decreased range of motion Psych:  No change in mood or affect. No memory loss.   Past Medical History  Diagnosis Date  . Hypertension   . Anxiety   . COPD (chronic obstructive pulmonary disease)   . Hyperlipemia   . GERD (gastroesophageal reflux disease)   . Barrett's esophagus with high  grade dysplasia     s/p RFA  . PVD (peripheral vascular disease)   . CAD (coronary artery disease)   . Tobacco user   . Depression   . Hiatal hernia   . Myocardial infarction 12/1996  . AAA (abdominal aortic aneurysm)   . Renal insufficiency 2010  . Bruises easily     on hands  . Pneumonia 06/22/14  . Type II diabetes mellitus   . 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 Cancer     PAROTID/LYMPH NODE CANCER LEFT SIDE  . History of radiation therapy 09/23/14-11/12/14    left neck/parotid bed 66 Gy 33 fx   Past Surgical History  Procedure Laterality Date  . Tonsillectomy    . Nissen fundoplication    . Abdominal aortic aneurysm repair  2010    and right common iliac artery aneurysm, DR HAYES  . Upper gastrointestinal endoscopy  07/07/2011    barretts esophagus, hiatal hernia, gastritis, ablations in esophagus  . Colonoscopy  05/07/2005    diverticulosis, internal and external hemorrhoids  . Radiofrequency ablation  multiple    Barrett's, high-grade dysplasia  . Colonoscopy N/A 03/01/2013    Procedure: COLONOSCOPY;  Surgeon: Gatha Mayer, MD;  Location: [facility];  Service: Endoscopy;  Laterality: N/A;  . Coronary angioplasty with stent placement  1998    "1"  . Parotidectomy w/ neck dissection total Left 08/13/2014  PARTIAL NECK DISSECTION  . Hernia repair    . Parotidectomy Left 08/13/2014    Procedure: PAROTIDECTOMY WITH LIMITED LEFT NECK DISSECTION;  Surgeon: Rozetta Nunnery, MD;  Location: [place];  Service: ENT;  Laterality: Left;   Social History:  reports that he quit smoking about 10 months ago. His smoking use included Cigarettes. He has a 58 pack-year smoking history. He has never used smokeless tobacco. He reports that he does not drink alcohol or use illicit drugs.  Allergies  Allergen Reactions  . Amlodipine Besylate Swelling  . Benazepril Hcl Other (See Comments)    Elevates potassium levels  . Risperidone Other (See Comments)     loss of motor skills  . Tramadol Nausea And Vomiting  . Valsartan Other (See Comments)    Elevates potassium levels  . Penicillins Itching and Rash    Family History  Problem Relation Age of Onset  . Coronary artery disease Other   . Diabetes Other   . Asthma Other   . Mental illness Mother     dementia  . Cancer Mother   . Diabetes Mother   . Heart disease Father   . Diabetes Father   . Stomach cancer Father   . Cancer Father   . Colon cancer Neg Hx   . Esophageal cancer Neg Hx   . Heart attack Neg Hx   . Stroke Neg Hx      Prior to Admission medications   Medication Sig Start Date End Date Taking? Authorizing Provider  ALPRAZolam Duanne Moron) 0.5 MG tablet TAKE 1 TABLET TWICE A DAY AS NEEDED 11/29/14  Yes Aleksei Plotnikov V, MD  aspirin EC 81 MG tablet Take 81 mg by mouth every morning.    Yes Historical Provider, MD  atorvastatin (LIPITOR) 10 MG tablet Take 10 mg by mouth daily.    Yes Historical Provider, MD  calcium-vitamin D (OSCAL WITH D) 500-200 MG-UNIT per tablet Take 1 tablet by mouth 2 (two) times daily.    Yes Historical Provider, MD  cholecalciferol (VITAMIN D) 1000 UNITS tablet Take 2,000 Units by mouth 2 (two) times daily.    Yes Historical Provider, MD  cloNIDine (CATAPRES) 0.1 MG tablet Take 1 tablet (0.1 mg total) by mouth 2 (two) times daily. 01/17/15  Yes Olga Millers, MD  FLUoxetine (PROZAC) 10 MG capsule Take 1 capsule (10 mg total) by mouth daily. 08/30/14  Yes Aleksei Plotnikov V, MD  gabapentin (NEURONTIN) 100 MG capsule Take 1 capsule (100 mg total) by mouth 2 (two) times daily. 05/30/14  Yes Aleksei Plotnikov V, MD  glimepiride (AMARYL) 1 MG tablet Take 1 tablet (1 mg total) by mouth 2 (two) times daily. 06/22/14  Yes Aleksei Plotnikov V, MD  iron polysaccharides (NIFEREX) 150 MG capsule Take 1 capsule (150 mg total) by mouth daily. 05/10/14  Yes Aleksei Plotnikov V, MD  labetalol (NORMODYNE) 200 MG tablet Take 0.5 tablets (100 mg total) by mouth 3 (three)  times daily. Patient taking differently: Take 100 mg by mouth 2 (two) times daily. Take 0.5 tablet (100 mg) in the am & Take 1 tablet (200 mg) at bedtime. 01/10/15  Yes Aleksei Plotnikov V, MD  Magnesium 300 MG CAPS Take 300 mg by mouth daily.   Yes Historical Provider, MD  mirtazapine (REMERON) 15 MG tablet Take 1-2 tablets (15-30 mg total) by mouth at bedtime as needed (for sleep). 01/22/15  Yes Aleksei Plotnikov V, MD  Multiple Vitamin (MULTIVITAMIN WITH MINERALS) TABS tablet Take 1 tablet by  mouth every morning.   Yes Historical Provider, MD  omeprazole (PRILOSEC) 40 MG capsule Take 1 capsule (40 mg total) by mouth 2 (two) times daily. 05/23/14  Yes Aleksei Plotnikov V, MD  sodium bicarbonate 650 MG tablet Take 1,300 mg by mouth every morning.    Yes Historical Provider, MD  temazepam (RESTORIL) 30 MG capsule Take 30 mg by mouth at bedtime.  02/04/15  Yes Historical Provider, MD  Umeclidinium-Vilanterol (ANORO ELLIPTA) 62.5-25 MCG/INH AEPB Inhale 1 puff into the lungs daily.   Yes Historical Provider, MD  glucose blood (ONE TOUCH TEST STRIPS) test strip Use as instructed 08/08/14   Cassandria Anger, MD  [facility] LANCETS FINE MISC  07/04/14   Historical Provider, MD   Physical Exam: Filed Vitals:   03/18/15 2106 03/18/15 2135 03/18/15 2212 03/18/15 2251  BP: 154/70  163/79 147/74  Pulse: 68  72 70  Temp: 98.2 F (36.8 C) 98 F (36.7 C)    TempSrc: Oral     Resp: 18  11 19   Height: 5\' 9"  (1.753 m)     Weight: 61.236 kg (135 lb)     SpO2: 98%  100% 98%    Wt Readings from Last 3 Encounters:  03/18/15 61.236 kg (135 lb)  01/10/15 66.112 kg (145 lb 12 oz)  12/10/14 67.132 kg (148 lb)    General:  Appears calm and comfortable Eyes: PERRL, normal lids, irises & conjunctiva ENT: grossly normal hearing, lips & tongue no upper teeth Neck: no LAD, masses or thyromegaly Cardiovascular: RRR, no m/r/g. No LE edema Respiratory: CTA bilaterally, no w/r/r Abdomen: soft, ntnd Skin: no  rash or induration seen on limited exam Musculoskeletal: grossly normal tone BUE/BLE Psychiatric: grossly normal mood and affect Neurologic: subtle RLE and RUE weakness noted          Labs on Admission:  Basic Metabolic Panel:  Recent Labs Lab 03/18/15 2124 03/18/15 2133  NA 137 142  K 2.9* 3.0*  CL 112* 111  CO2 16*  --   GLUCOSE 71 66  BUN 63* 58*  CREATININE 2.42* 2.70*  CALCIUM 9.0  --    Liver Function Tests:  Recent Labs Lab 03/18/15 2124  AST 24  ALT 32  ALKPHOS 115  BILITOT 0.5  PROT 8.0  ALBUMIN 4.2   No results for input(s): LIPASE, AMYLASE in the last 168 hours. No results for input(s): AMMONIA in the last 168 hours. CBC:  Recent Labs Lab 03/18/15 2124 03/18/15 2133  WBC 14.6*  --   NEUTROABS 12.0*  --   HGB 11.1* 12.2*  HCT 33.7* 36.0*  MCV 93.4  --   PLT 204  --    Cardiac Enzymes: No results for input(s): CKTOTAL, CKMB, CKMBINDEX, TROPONINI in the last 168 hours.  BNP (last 3 results) No results for input(s): BNP in the last 8760 hours.  ProBNP (last 3 results) No results for input(s): PROBNP in the last 8760 hours.  CBG: No results for input(s): GLUCAP in the last 168 hours.  Radiological Exams on Admission: Ct Head Wo Contrast  03/18/2015   CLINICAL DATA:  RIGHT leg weakness twice today, with difficulty speaking both times, speech now clear, past history hypertension, diabetes, LEFT parotid cancer  EXAM: CT HEAD WITHOUT CONTRAST  TECHNIQUE: Contiguous axial images were obtained from the base of the skull through the vertex without intravenous contrast.  COMPARISON:  03/22/2013  FINDINGS: Generalized atrophy.  Normal ventricular morphology.  No midline shift or mass effect.  Small  vessel chronic ischemic changes of deep cerebral white matter.  Periventricular white matter infarct LEFT parietal.  No definite intracranial hemorrhage, mass lesion, or evidence acute infarction.  No extra-axial fluid collections.  Atherosclerotic calcification  at carotid siphons.  Bones demineralized.  Minimal fluid within a LEFT ethmoid air cell.  Remaining sinuses and mastoid air cells clear.  IMPRESSION: Atrophy with small vessel chronic ischemic changes of deep cerebral white matter.  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  No acute intracranial abnormalities.   Electronically Signed   By: Lavonia Dana M.D.   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 TIA -will be admitted for further workup at [facility] per neurology -will get MRI MRA need to check if he can have this done due to stents in his AAA -will get carotid doppler -echo doppler -will check A1C  2. COPD with emphysema -will continue with inhalers as ordered -monitor saturations oxygen as needed   3. DM Type 2 with PVD -will check A1C -FSBS and sliding scale as needed  4. CKD III -creatinine is up from baseline -will hydrate and monitor labs  5. Hypokalemia -replete potassium as needed  6. Tobacco Use -counseling provided for smoking cessation   Code Status: Full Code (must indicate code status--if unknown or must be presumed, indicate so) DVT Prophylaxis:Heparin Family Communication: None (indicate person spoken with, if applicable, with phone number if by telephone) Disposition Plan: Home (indicate anticipated LOS)  Time spent: 95min  KHAN,SAADAT A [facility] Pager [phone]

## 2015-03-18 NOTE — ED Provider Notes (Signed)
CSN: [id number]     Arrival date & time 03/18/15  2055 History   First MD Initiated Contact with Patient 03/18/15 2058     Chief Complaint  Patient presents with  . Extremity Weakness     (Consider location/radiation/quality/duration/timing/severity/associated sxs/prior Treatment) HPI Comments: The patient is a 74 year old male with a history of diabetes, hypercholesterolemia and hypertension who presents to the hospital with intermittent right leg weakness and aphasia which has been going on throughout the most part of the day today. The symptoms do come and go, he initially woke with the symptoms, they resolved by midmorning and he was able to ambulate without difficulty, his speech was back to normal. At 7:30 PM the symptoms came on acutely and he was unable to ambulate again. Paramedics report that in route to the hospital his symptoms improved markedly and currently the patient states he feels like he is back to himself. He denies headaches nausea vomiting fevers chills coughing shortness of breath diarrhea swelling or rashes.  Patient is a 74 y.o. male presenting with extremity weakness. The history is provided by the patient.  Extremity Weakness    Past Medical History  Diagnosis Date  . Hypertension   . Anxiety   . COPD (chronic obstructive pulmonary disease)   . Hyperlipemia   . GERD (gastroesophageal reflux disease)   . Barrett's esophagus with high grade dysplasia     s/p RFA  . PVD (peripheral vascular disease)   . CAD (coronary artery disease)   . Tobacco user   . Depression   . Hiatal hernia   . Myocardial infarction 12/1996  . AAA (abdominal aortic aneurysm)   . Renal insufficiency 2010  . Bruises easily     on hands  . Pneumonia 06/22/14  . Type II diabetes mellitus   . 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 Cancer     PAROTID/LYMPH NODE CANCER LEFT SIDE  . History of radiation therapy 09/23/14-11/12/14    left neck/parotid bed 66 Gy 33 fx    Past Surgical History  Procedure Laterality Date  . Tonsillectomy    . Nissen fundoplication    . Abdominal aortic aneurysm repair  2010    and right common iliac artery aneurysm, DR HAYES  . Upper gastrointestinal endoscopy  07/07/2011    barretts esophagus, hiatal hernia, gastritis, ablations in esophagus  . Colonoscopy  05/07/2005    diverticulosis, internal and external hemorrhoids  . Radiofrequency ablation  multiple    Barrett's, high-grade dysplasia  . Colonoscopy N/A 03/01/2013    Procedure: COLONOSCOPY;  Surgeon: Gatha Mayer, MD;  Location: [facility];  Service: Endoscopy;  Laterality: N/A;  . Coronary angioplasty with stent placement  1998    "1"  . Parotidectomy w/ neck dissection total Left 08/13/2014    PARTIAL NECK DISSECTION  . Hernia repair    . Parotidectomy Left 08/13/2014    Procedure: PAROTIDECTOMY WITH LIMITED LEFT NECK DISSECTION;  Surgeon: Rozetta Nunnery, MD;  Location: [facility] OR;  Service: ENT;  Laterality: Left;   Family History  Problem Relation Age of Onset  . Coronary artery disease Other   . Diabetes Other   . Asthma Other   . Mental illness Mother     dementia  . Cancer Mother   . Diabetes Mother   . Heart disease Father   . Diabetes Father   . Stomach cancer Father   . Cancer Father   . Colon cancer Neg  Hx   . Esophageal cancer Neg Hx   . Heart attack Neg Hx   . Stroke Neg Hx    History  Substance Use Topics  . Smoking status: Former Smoker -- 1.00 packs/day for 58 years    Types: Cigarettes    Quit date: 05/09/2014  . Smokeless tobacco: Never Used     Comment: pt uses e-cigs  . 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  All other systems reviewed and are negative.     Allergies  Amlodipine besylate; Benazepril hcl; Risperidone; Tramadol; Valsartan; and Penicillins  Home Medications   Prior to Admission medications   Medication Sig Start Date  End Date Taking? Authorizing Provider  ALPRAZolam Duanne Moron) 0.5 MG tablet TAKE 1 TABLET TWICE A DAY AS NEEDED 11/29/14  Yes Aleksei Plotnikov V, MD  aspirin EC 81 MG tablet Take 81 mg by mouth every morning.    Yes Historical Provider, MD  atorvastatin (LIPITOR) 10 MG tablet Take 10 mg by mouth daily.    Yes Historical Provider, MD  calcium-vitamin D (OSCAL WITH D) 500-200 MG-UNIT per tablet Take 1 tablet by mouth 2 (two) times daily.    Yes Historical Provider, MD  cholecalciferol (VITAMIN D) 1000 UNITS tablet Take 2,000 Units by mouth 2 (two) times daily.    Yes Historical Provider, MD  cloNIDine (CATAPRES) 0.1 MG tablet Take 1 tablet (0.1 mg total) by mouth 2 (two) times daily. 01/17/15  Yes Olga Millers, MD  FLUoxetine (PROZAC) 10 MG capsule Take 1 capsule (10 mg total) by mouth daily. 08/30/14  Yes Aleksei Plotnikov V, MD  gabapentin (NEURONTIN) 100 MG capsule Take 1 capsule (100 mg total) by mouth 2 (two) times daily. 05/30/14  Yes Aleksei Plotnikov V, MD  glimepiride (AMARYL) 1 MG tablet Take 1 tablet (1 mg total) by mouth 2 (two) times daily. 06/22/14  Yes Aleksei Plotnikov V, MD  iron polysaccharides (NIFEREX) 150 MG capsule Take 1 capsule (150 mg total) by mouth daily. 05/10/14  Yes Aleksei Plotnikov V, MD  labetalol (NORMODYNE) 200 MG tablet Take 0.5 tablets (100 mg total) by mouth 3 (three) times daily. Patient taking differently: Take 100 mg by mouth 2 (two) times daily. Take 0.5 tablet (100 mg) in the am & Take 1 tablet (200 mg) at bedtime. 01/10/15  Yes Aleksei Plotnikov V, MD  Magnesium 300 MG CAPS Take 300 mg by mouth daily.   Yes Historical Provider, MD  mirtazapine (REMERON) 15 MG tablet Take 1-2 tablets (15-30 mg total) by mouth at bedtime as needed (for sleep). 01/22/15  Yes Aleksei Plotnikov V, MD  Multiple Vitamin (MULTIVITAMIN WITH MINERALS) TABS tablet Take 1 tablet by mouth every morning.   Yes Historical Provider, MD  omeprazole (PRILOSEC) 40 MG capsule Take 1 capsule (40 mg  total) by mouth 2 (two) times daily. 05/23/14  Yes Aleksei Plotnikov V, MD  sodium bicarbonate 650 MG tablet Take 1,300 mg by mouth every morning.    Yes Historical Provider, MD  temazepam (RESTORIL) 30 MG capsule Take 30 mg by mouth at bedtime.  02/04/15  Yes Historical Provider, MD  Umeclidinium-Vilanterol (ANORO ELLIPTA) 62.5-25 MCG/INH AEPB Inhale 1 puff into the lungs daily.   Yes Historical Provider, MD  glucose blood (ONE TOUCH TEST STRIPS) test strip Use as instructed 08/08/14   Cassandria Anger, MD  [facility] DELICA LANCETS FINE MISC  07/04/14   Historical Provider,  MD   BP 147/74 mmHg  Pulse 70  Temp(Src) 98 F (36.7 C) (Oral)  Resp 19  Ht 5\' 9"  (1.753 m)  Wt 135 lb (61.236 kg)  BMI 19.93 kg/m2  SpO2 98% Physical Exam  Constitutional: He appears well-developed and well-nourished. No distress.  HENT:  Head: Normocephalic and atraumatic.  Mouth/Throat: Oropharynx is clear and moist. No oropharyngeal exudate.  Eyes: Conjunctivae and EOM are normal. Pupils are equal, round, and reactive to light. Right eye exhibits no discharge. Left eye exhibits no discharge. No scleral icterus.  Neck: Normal range of motion. Neck supple. No JVD present. No thyromegaly present.  Cardiovascular: Normal rate, regular rhythm, normal heart sounds and intact distal pulses.  Exam reveals no gallop and no friction rub.   No murmur heard. Carotid bruits bilaterally, left greater than right  Pulmonary/Chest: Effort normal and breath sounds normal. No respiratory distress. He has no wheezes. He has no rales.  Abdominal: Soft. Bowel sounds are normal. He exhibits no distension and no mass. There is no tenderness.  Musculoskeletal: Normal range of motion. He exhibits no edema or tenderness.  Lymphadenopathy:    He has no cervical adenopathy.  Neurological: He is alert. Coordination normal.  Preserved ability to straight leg raise on the right lower extremity but slight weakness compared to the left. Some  difficulty with heel shin with right leg moving. No dysmetria with the arms, normal strength in the bilateral arms, cranial nerves III through XII intact, speech is normal.  Skin: Skin is warm and dry. No rash noted. No erythema.  Psychiatric: He has a normal mood and affect. His behavior is normal.  Nursing note and vitals reviewed.   ED Course  Procedures (including critical care time) Labs Review Labs Reviewed  CBC - Abnormal; Notable for the following:    WBC 14.6 (*)    RBC 3.61 (*)    Hemoglobin 11.1 (*)    HCT 33.7 (*)    All other components within normal limits  DIFFERENTIAL - Abnormal; Notable for the following:    Neutrophils Relative % 82 (*)    Neutro Abs 12.0 (*)    Lymphocytes Relative 8 (*)    All other components within normal limits  COMPREHENSIVE METABOLIC PANEL - Abnormal; Notable for the following:    Potassium 2.9 (*)    Chloride 112 (*)    CO2 16 (*)    BUN 63 (*)    Creatinine, Ser 2.42 (*)    GFR calc non Af Amer 25 (*)    GFR calc Af Amer 29 (*)    All other components within normal limits  URINE RAPID DRUG SCREEN (HOSP PERFORMED) - Abnormal; Notable for the following:    Benzodiazepines POSITIVE (*)    All other components within normal limits  I-STAT CHEM 8, ED - Abnormal; Notable for the following:    Potassium 3.0 (*)    BUN 58 (*)    Creatinine, Ser 2.70 (*)    Hemoglobin 12.2 (*)    HCT 36.0 (*)    All other components within normal limits  ETHANOL  PROTIME-INR  APTT  URINALYSIS, ROUTINE W REFLEX MICROSCOPIC  I-STAT TROPOININ, ED    Imaging Review Ct Head Wo Contrast  03/18/2015   CLINICAL DATA:  RIGHT leg weakness twice today, with difficulty speaking both times, speech now clear, past history hypertension, diabetes, LEFT parotid cancer  EXAM: CT HEAD WITHOUT CONTRAST  TECHNIQUE: Contiguous axial images were obtained from the base of the  skull through the vertex without intravenous contrast.  COMPARISON:  03/22/2013  FINDINGS:  Generalized atrophy.  Normal ventricular morphology.  No midline shift or mass effect.  Small vessel chronic ischemic changes of deep cerebral white matter.  Periventricular white matter infarct LEFT parietal.  No definite intracranial hemorrhage, mass lesion, or evidence acute infarction.  No extra-axial fluid collections.  Atherosclerotic calcification at carotid siphons.  Bones demineralized.  Minimal fluid within a LEFT ethmoid air cell.  Remaining sinuses and mastoid air cells clear.  IMPRESSION: Atrophy with small vessel chronic ischemic changes of deep cerebral white matter.  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  No acute intracranial abnormalities.   Electronically Signed   By: Lavonia Dana M.D.   On: 03/18/2015 22:25     EKG Interpretation   Date/Time:  Monday Mar 18 2015 21:20:11 EDT Ventricular Rate:  67 PR Interval:  198 QRS Duration: 105 QT Interval:  424 QTC Calculation: 448 R Axis:   49 Text Interpretation:  Sinus rhythm Abnormal R-wave progression, early  transition Baseline wander in lead(s) V3 since last tracing no significant  change Confirmed by Rachana Malesky  MD, Mychaela Lennartz ([id number]) on 03/18/2015 9:26:29 PM      MDM   Final diagnoses:  Transient cerebral ischemia, unspecified transient cerebral ischemia type    The patient has dense carotid bruits, he now has intermittent symptoms which have essentially resolved, thrombolytic therapy is not indicated at this time however will discussed urgently with neurology, CT scan ordered, labs ordered, EKG ordered.  D/w Dr. Nicole Kindred immediately who recommends holdling thrombolytics and transferring hte pt to [facility] to be admitted by the hospitalist service.    No recurrent symptoms, CT scan negative for acute hemorrhagic stroke, labs show chronic renal insufficiency, discussed with hospitalist who will admit.    Noemi Chapel, MD 03/18/15 2258

## 2015-03-18 NOTE — ED Notes (Signed)
Returned from CT.

## 2015-03-18 NOTE — ED Notes (Signed)
Patient transported to CT 

## 2015-03-18 NOTE — ED Notes (Signed)
Patient arrives by EMS with complaints of right leg weakness x 2 today-when he first woke up and this afternoon after a walk-also states difficulty speaking with these episodes-EMS states now patient has negative stroke scale-speech clear and stated patient has difficulty ambulating with the prior episode

## 2015-03-18 NOTE — ED Notes (Signed)
Bed: WA17 Expected date:  Expected time:  Means of arrival:  Comments: EMS 

## 2015-03-18 NOTE — ED Notes (Signed)
Pt states he woke up this morning and went to get out of bed and could not walk  Pt states he tried to communicate it to his wife and could not get his words out  Pt says he knows what he wanted to say but could not  Pt states later the symptoms resolved and he was able to carry out his regular routine  Pt states he even went for a walk around his block  Pt states this evening he laid down for a nap and his wife woke him up when she got home about 1915 and he had another episode where he could not walk or talk  Pt states symptoms have resolved by the time he arrived here  Pt has no visible deficits noted with the exceptions of some mild weakness in his right leg

## 2015-03-19 ENCOUNTER — Inpatient Hospital Stay (HOSPITAL_COMMUNITY): Payer: Medicare Other

## 2015-03-19 ENCOUNTER — Encounter (HOSPITAL_COMMUNITY): Payer: Self-pay | Admitting: Radiology

## 2015-03-19 DIAGNOSIS — I6529 Occlusion and stenosis of unspecified carotid artery: Secondary | ICD-10-CM | POA: Insufficient documentation

## 2015-03-19 DIAGNOSIS — I10 Essential (primary) hypertension: Secondary | ICD-10-CM

## 2015-03-19 DIAGNOSIS — I6522 Occlusion and stenosis of left carotid artery: Secondary | ICD-10-CM | POA: Diagnosis not present

## 2015-03-19 DIAGNOSIS — E1159 Type 2 diabetes mellitus with other circulatory complications: Secondary | ICD-10-CM | POA: Diagnosis not present

## 2015-03-19 DIAGNOSIS — J439 Emphysema, unspecified: Secondary | ICD-10-CM | POA: Diagnosis not present

## 2015-03-19 DIAGNOSIS — N183 Chronic kidney disease, stage 3 (moderate): Secondary | ICD-10-CM | POA: Diagnosis not present

## 2015-03-19 DIAGNOSIS — R4789 Other speech disturbances: Secondary | ICD-10-CM | POA: Diagnosis not present

## 2015-03-19 DIAGNOSIS — E785 Hyperlipidemia, unspecified: Secondary | ICD-10-CM | POA: Diagnosis not present

## 2015-03-19 DIAGNOSIS — G451 Carotid artery syndrome (hemispheric): Secondary | ICD-10-CM | POA: Diagnosis not present

## 2015-03-19 DIAGNOSIS — G459 Transient cerebral ischemic attack, unspecified: Secondary | ICD-10-CM | POA: Diagnosis not present

## 2015-03-19 DIAGNOSIS — R29898 Other symptoms and signs involving the musculoskeletal system: Secondary | ICD-10-CM | POA: Diagnosis not present

## 2015-03-19 LAB — CREATININE, SERUM
Creatinine, Ser: 2.53 mg/dL — ABNORMAL HIGH (ref 0.61–1.24)
GFR calc Af Amer: 27 mL/min — ABNORMAL LOW (ref >60)
GFR calc non Af Amer: 24 mL/min — ABNORMAL LOW (ref >60)

## 2015-03-19 LAB — RAPID URINE DRUG SCREEN, HOSP PERFORMED
AMPHETAMINES: NOT DETECTED
BENZODIAZEPINES: POSITIVE — AB
Barbiturates: NOT DETECTED
Cocaine: NOT DETECTED
OPIATES: NOT DETECTED
Tetrahydrocannabinol: NOT DETECTED

## 2015-03-19 LAB — GLUCOSE, CAPILLARY
GLUCOSE-CAPILLARY: 158 mg/dL — AB (ref 65–99)
Glucose-Capillary: 207 mg/dL — ABNORMAL HIGH (ref 65–99)
Glucose-Capillary: 104 mg/dL — ABNORMAL HIGH (ref 65–99)
Glucose-Capillary: 126 mg/dL — ABNORMAL HIGH (ref 65–99)

## 2015-03-19 LAB — CBC
HEMATOCRIT: 31.8 % — AB (ref 39.0–52.0)
HEMOGLOBIN: 10.3 g/dL — AB (ref 13.0–17.0)
MCH: 29.9 pg (ref 26.0–34.0)
MCHC: 32.4 g/dL (ref 30.0–36.0)
MCV: 92.2 fL (ref 78.0–100.0)
Platelets: 204 10*3/uL (ref 150–400)
RBC: 3.45 MIL/uL — ABNORMAL LOW (ref 4.22–5.81)
RDW: 14.9 % (ref 11.5–15.5)
WBC: 9.5 10*3/uL (ref 4.0–10.5)

## 2015-03-19 LAB — LIPID PANEL
Cholesterol: 113 mg/dL (ref 0–200)
HDL: 24 mg/dL — AB (ref >40)
LDL CALC: 40 mg/dL (ref 0–99)
Total CHOL/HDL Ratio: 4.7 RATIO
Triglycerides: 245 mg/dL — ABNORMAL HIGH (ref <150)
VLDL: 49 mg/dL — AB (ref 0–40)

## 2015-03-19 MED ORDER — STROKE: EARLY STAGES OF RECOVERY BOOK
Freq: Once | Status: AC
  Administered 2015-03-19: 03:00:00

## 2015-03-19 MED ORDER — POTASSIUM CHLORIDE CRYS ER 20 MEQ PO TBCR
40.0000 meq | EXTENDED_RELEASE_TABLET | Freq: Once | ORAL | Status: AC
  Administered 2015-03-19: 40 meq via ORAL
  Filled 2015-03-19: qty 2

## 2015-03-19 MED ORDER — ENSURE ENLIVE PO LIQD
237.0000 mL | Freq: Two times a day (BID) | ORAL | Status: DC
  Administered 2015-03-19: 237 mL via ORAL

## 2015-03-19 MED ORDER — ZOLPIDEM TARTRATE 5 MG PO TABS
5.0000 mg | ORAL_TABLET | Freq: Every day | ORAL | Status: DC
  Administered 2015-03-19: 5 mg via ORAL
  Filled 2015-03-19: qty 1

## 2015-03-19 MED ORDER — LABETALOL HCL 100 MG PO TABS
100.0000 mg | ORAL_TABLET | Freq: Two times a day (BID) | ORAL | Status: DC
  Administered 2015-03-19 – 2015-03-20 (×4): 100 mg via ORAL
  Filled 2015-03-19 (×4): qty 1

## 2015-03-19 MED ORDER — BOOST / RESOURCE BREEZE PO LIQD
1.0000 | Freq: Three times a day (TID) | ORAL | Status: DC
  Administered 2015-03-19 – 2015-03-20 (×4): 1 via ORAL

## 2015-03-19 MED ORDER — HEPARIN SODIUM (PORCINE) 5000 UNIT/ML IJ SOLN
5000.0000 [IU] | Freq: Three times a day (TID) | INTRAMUSCULAR | Status: DC
  Administered 2015-03-19 – 2015-03-20 (×3): 5000 [IU] via SUBCUTANEOUS
  Filled 2015-03-19 (×3): qty 1

## 2015-03-19 MED ORDER — GLIMEPIRIDE 1 MG PO TABS
1.0000 mg | ORAL_TABLET | Freq: Two times a day (BID) | ORAL | Status: DC
  Administered 2015-03-19 – 2015-03-20 (×3): 1 mg via ORAL
  Filled 2015-03-19 (×6): qty 1

## 2015-03-19 MED ORDER — CALCIUM CARBONATE-VITAMIN D 500-200 MG-UNIT PO TABS
1.0000 | ORAL_TABLET | Freq: Two times a day (BID) | ORAL | Status: DC
  Administered 2015-03-19 – 2015-03-20 (×3): 1 via ORAL
  Filled 2015-03-19 (×3): qty 1

## 2015-03-19 MED ORDER — UMECLIDINIUM-VILANTEROL 62.5-25 MCG/INH IN AEPB: 1.0000 | INHALATION_SPRAY | Freq: Every day | RESPIRATORY_TRACT | Status: DC

## 2015-03-19 MED ORDER — PANTOPRAZOLE SODIUM 40 MG PO TBEC
80.0000 mg | DELAYED_RELEASE_TABLET | Freq: Every day | ORAL | Status: DC
  Administered 2015-03-19 – 2015-03-20 (×2): 80 mg via ORAL
  Filled 2015-03-19 (×2): qty 2

## 2015-03-19 MED ORDER — FLUOXETINE HCL 10 MG PO CAPS
10.0000 mg | ORAL_CAPSULE | Freq: Every day | ORAL | Status: DC
  Administered 2015-03-19 – 2015-03-20 (×2): 10 mg via ORAL
  Filled 2015-03-19 (×2): qty 1

## 2015-03-19 MED ORDER — ATORVASTATIN CALCIUM 10 MG PO TABS
10.0000 mg | ORAL_TABLET | Freq: Every day | ORAL | Status: DC
  Administered 2015-03-19 – 2015-03-20 (×2): 10 mg via ORAL
  Filled 2015-03-19 (×2): qty 1

## 2015-03-19 MED ORDER — GABAPENTIN 100 MG PO CAPS
100.0000 mg | ORAL_CAPSULE | Freq: Two times a day (BID) | ORAL | Status: DC
  Administered 2015-03-19 – 2015-03-20 (×4): 100 mg via ORAL
  Filled 2015-03-19 (×4): qty 1

## 2015-03-19 MED ORDER — ALPRAZOLAM 0.5 MG PO TABS: 0.5000 mg | ORAL_TABLET | Freq: Two times a day (BID) | ORAL | Status: DC | PRN

## 2015-03-19 MED ORDER — ACETAMINOPHEN 325 MG PO TABS: 650.0000 mg | ORAL_TABLET | ORAL | Status: DC | PRN

## 2015-03-19 MED ORDER — ASPIRIN 325 MG PO TABS
325.0000 mg | ORAL_TABLET | Freq: Every day | ORAL | Status: DC
  Administered 2015-03-19 – 2015-03-20 (×2): 325 mg via ORAL
  Filled 2015-03-19 (×2): qty 1

## 2015-03-19 MED ORDER — VITAMIN D 1000 UNITS PO TABS
2000.0000 [IU] | ORAL_TABLET | Freq: Two times a day (BID) | ORAL | Status: DC
  Administered 2015-03-19 – 2015-03-20 (×4): 2000 [IU] via ORAL
  Filled 2015-03-19 (×4): qty 2

## 2015-03-19 MED ORDER — TEMAZEPAM 15 MG PO CAPS: 30.0000 mg | ORAL_CAPSULE | Freq: Every day | ORAL | Status: DC

## 2015-03-19 MED ORDER — CLONIDINE HCL 0.1 MG PO TABS
0.1000 mg | ORAL_TABLET | Freq: Two times a day (BID) | ORAL | Status: DC
  Administered 2015-03-19 – 2015-03-20 (×4): 0.1 mg via ORAL
  Filled 2015-03-19 (×4): qty 1

## 2015-03-19 MED ORDER — ENSURE ENLIVE PO LIQD: 237.0000 mL | ORAL | Status: DC

## 2015-03-19 MED ORDER — MIRTAZAPINE 15 MG PO TABS: 15.0000 mg | ORAL_TABLET | Freq: Every evening | ORAL | Status: DC | PRN

## 2015-03-19 MED ORDER — POLYSACCHARIDE IRON COMPLEX 150 MG PO CAPS
150.0000 mg | ORAL_CAPSULE | Freq: Every day | ORAL | Status: DC
  Administered 2015-03-19 – 2015-03-20 (×2): 150 mg via ORAL
  Filled 2015-03-19 (×2): qty 1

## 2015-03-19 MED ORDER — SODIUM BICARBONATE 650 MG PO TABS
1300.0000 mg | ORAL_TABLET | Freq: Every morning | ORAL | Status: DC
  Administered 2015-03-19 – 2015-03-20 (×2): 1300 mg via ORAL
  Filled 2015-03-19 (×2): qty 2

## 2015-03-19 MED ORDER — MAGNESIUM OXIDE 400 (241.3 MG) MG PO TABS
400.0000 mg | ORAL_TABLET | Freq: Every day | ORAL | Status: DC
  Administered 2015-03-19 – 2015-03-20 (×2): 400 mg via ORAL
  Filled 2015-03-19 (×2): qty 1

## 2015-03-19 NOTE — ED Notes (Signed)
[facility] called for transport and wife notified of bed assignment

## 2015-03-19 NOTE — Consult Note (Addendum)
Consult Note  Patient name: [name] MRN: [id number] DOB: [birth year] Sex: male  Consulting Physician:  Hospital service  Reason for Consult:  Chief Complaint  Patient presents with  . Extremity Weakness    HISTORY OF PRESENT ILLNESS: This is a 74 year old who is s/p endovascular AAA repair with coil embolization of the right hypogastric artery using a Cook Zenith device by Dr. Amedeo Plenty in 3/22010.  He is followed by Dr. Scot Dock for this as Dr Amedeo Plenty has left our practice.  He presented to the ER on 5/16 with weakness in his right leg and dysarthria.  He had 2 episodes. When he arrived, his symptoms had resolved.  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  He is medically managed for hypertension.  He is treated for hypercholesterolemia with a statin.  He has chronic renal insufficiency.  His recent creatinine was 2.7.  He has a history of MI with history of stenting in 1998.  He also has a history of salivary gland cancer, s/p peft parotidectomy with lymph node dissection in 2015, also treated with [id number].  Past Medical History  Diagnosis Date  . Hypertension   . Anxiety   . COPD (chronic obstructive pulmonary disease)   . Hyperlipemia   . GERD (gastroesophageal reflux disease)   . Barrett's esophagus with high grade dysplasia     s/p RFA  . PVD (peripheral vascular disease)   . CAD (coronary artery disease)   . Tobacco user   . Depression   . Hiatal hernia   . Myocardial infarction 12/1996  . AAA (abdominal aortic aneurysm)     Cook Zenith, safe  . Renal insufficiency 2010  . Bruises easily     on hands  . Pneumonia 06/22/14  . Type II diabetes mellitus   . 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 Cancer     PAROTID/LYMPH NODE CANCER LEFT SIDE  . History of radiation therapy 09/23/14-11/12/14    left neck/parotid bed 66 Gy 33 fx    Past Surgical History  Procedure Laterality Date  . Tonsillectomy    . Nissen fundoplication    .  Abdominal aortic aneurysm repair  2010    and right common iliac artery aneurysm, DR HAYES  . Upper gastrointestinal endoscopy  07/07/2011    barretts esophagus, hiatal hernia, gastritis, ablations in esophagus  . Colonoscopy  05/07/2005    diverticulosis, internal and external hemorrhoids  . Radiofrequency ablation  multiple    Barrett's, high-grade dysplasia  . Colonoscopy N/A 03/01/2013    Procedure: COLONOSCOPY;  Surgeon: Gatha Mayer, MD;  Location: [facility];  Service: Endoscopy;  Laterality: N/A;  . Coronary angioplasty with stent placement  1998    "1"  . Parotidectomy w/ neck dissection total Left 08/13/2014    PARTIAL NECK DISSECTION  . Hernia repair    . Parotidectomy Left 08/13/2014    Procedure: PAROTIDECTOMY WITH LIMITED LEFT NECK DISSECTION;  Surgeon: Rozetta Nunnery, MD;  Location: [facility];  Service: ENT;  Laterality: Left;    History   Social History  . Marital Status: Married    Spouse Name: N/A  . Number of Children: 3  . Years of Education: N/A   Occupational History  . Retired [employer] manager    Social History Main Topics  . Smoking status: Former Smoker -- 1.00 packs/day for 58 years    Types: Cigarettes  Quit date: 05/09/2014  . Smokeless tobacco: Never Used     Comment: pt uses e-cigs  . 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 Drug Use: No  . Sexual Activity: Not Currently   Other Topics Concern  . Not on file   Social History Narrative   Regular Exercise -  NO    Family History  Problem Relation Age of Onset  . Coronary artery disease Other   . Diabetes Other   . Asthma Other   . Mental illness Mother     dementia  . Cancer Mother   . Diabetes Mother   . Heart disease Father   . Diabetes Father   . Stomach cancer Father   . Cancer Father   . Colon cancer Neg Hx   . Esophageal cancer Neg Hx   . Heart attack Neg Hx   . Stroke Neg Hx     Allergies as of 03/18/2015 - Review Complete 03/18/2015  Allergen Reaction Noted    . Amlodipine besylate Swelling 08/06/2010  . Benazepril hcl Other (See Comments) 08/06/2010  . Risperidone Other (See Comments) 01/19/2011  . Tramadol Nausea And Vomiting 07/07/2011  . Valsartan Other (See Comments) 08/06/2010  . Penicillins Itching and Rash 08/31/2007    No current facility-administered medications on file prior to encounter.   Current Outpatient Prescriptions on File Prior to Encounter  Medication Sig Dispense Refill  . ALPRAZolam (XANAX) 0.5 MG tablet TAKE 1 TABLET TWICE A DAY AS NEEDED 60 tablet 3  . aspirin EC 81 MG tablet Take 81 mg by mouth every morning.     Marland Kitchen atorvastatin (LIPITOR) 10 MG tablet Take 10 mg by mouth daily.     . calcium-vitamin D (OSCAL WITH D) 500-200 MG-UNIT per tablet Take 1 tablet by mouth 2 (two) times daily.     . cholecalciferol (VITAMIN D) 1000 UNITS tablet Take 2,000 Units by mouth 2 (two) times daily.     . cloNIDine (CATAPRES) 0.1 MG tablet Take 1 tablet (0.1 mg total) by mouth 2 (two) times daily. 60 tablet 3  . FLUoxetine (PROZAC) 10 MG capsule Take 1 capsule (10 mg total) by mouth daily. 90 capsule 3  . gabapentin (NEURONTIN) 100 MG capsule Take 1 capsule (100 mg total) by mouth 2 (two) times daily. 180 capsule 3  . glimepiride (AMARYL) 1 MG tablet Take 1 tablet (1 mg total) by mouth 2 (two) times daily. 180 tablet 3  . iron polysaccharides (NIFEREX) 150 MG capsule Take 1 capsule (150 mg total) by mouth daily. 30 capsule 2  . labetalol (NORMODYNE) 200 MG tablet Take 0.5 tablets (100 mg total) by mouth 3 (three) times daily. (Patient taking differently: Take 100 mg by mouth 2 (two) times daily. Take 0.5 tablet (100 mg) in the am & Take 1 tablet (200 mg) at bedtime.) 270 tablet 2  . Magnesium 300 MG CAPS Take 300 mg by mouth daily.    . mirtazapine (REMERON) 15 MG tablet Take 1-2 tablets (15-30 mg total) by mouth at bedtime as needed (for sleep). 90 tablet 3  . Multiple Vitamin (MULTIVITAMIN WITH MINERALS) TABS tablet Take 1 tablet by  mouth every morning.    Marland Kitchen omeprazole (PRILOSEC) 40 MG capsule Take 1 capsule (40 mg total) by mouth 2 (two) times daily. 180 capsule 3  . sodium bicarbonate 650 MG tablet Take 1,300 mg by mouth every morning.     . temazepam (RESTORIL) 30 MG capsule Take  30 mg by mouth at bedtime.     Marland Kitchen Umeclidinium-Vilanterol (ANORO ELLIPTA) 62.5-25 MCG/INH AEPB Inhale 1 puff into the lungs daily.    Marland Kitchen glucose blood (ONE TOUCH TEST STRIPS) test strip Use as instructed 300 each 3  . ONETOUCH DELICA LANCETS FINE MISC        REVIEW OF SYSTEMS: Constitutional:  No weight loss, night sweats, Fevers, chills, +fatigue.  HEENT:  No headaches, itching, ear ache, nasal congestion, post nasal drip,  Cardio-vascular:  No chest pain, PND, swelling in lower extremities dizziness  GI:  No heartburn, indigestion, abdominal pain, nausea, vomiting, diarrhea  Resp:  No shortness of breath with exertion or at rest. No coughing up of blood.No change in color of mucus.No wheezing Skin:  no rash or lesion GU:  no dysuria, change in color of urine, no urgency or frequency Musculoskeletal:  No joint pain or swelling. No decreased range of motion Psych:  No change in mood or affect. No memory loss.   PHYSICAL EXAMINATION: General: The patient appears their stated age.  Vital signs are BP 155/61 mmHg  Pulse 68  Temp(Src) 98.1 F (36.7 C) (Oral)  Resp 16  Ht 5\' 9"  (1.753 m)  Wt 133 lb 2.5 oz (60.4 kg)  BMI 19.66 kg/m2  SpO2 98% Pulmonary: Respirations are non-labored HEENT:  No gross abnormalities Abdomen: Soft and non-tender  Musculoskeletal: There are no major deformities.   Neurologic: No focal weakness or paresthesias are detected, Skin: There are no ulcer or rashes noted. Psychiatric: The patient has normal affect. Cardiovascular: There is a regular rate and rhythm without significant murmur appreciated.  Diagnostic Studies: I have reviewed his carotid duplex which shows 1% to 39% upper end  of scale ICA stenosis. ECA stenosis. Vertebral artery flow is antegrade. Left - Greater than 80% ICA stenosis at the bulb/ proximal ICA and decrease to 1% to 39% upper end of scale In the mid ICA. ECA stenosis. Vertebral artery flow is antegrade  MRI and MRA reveal the following: No acute large vessel occlusion or high-grade stenosis. Complete circle of Willis. Mild luminal regularity of the proximal cerebral arteries most consistent with atheroscleros Remote LEFT corona radiata lacunar infarct. Moderate to severe white matter changes suggest chronic small vessel ischemic disease.      Assessment:  TIA with left carotid stenosis, >80% Plan: The patient is now on full dose ASA.  He likely had a TIA secondary to left ICA stenosis.  HE will need carotid intervention.  Given his recent XRT, I feel he is a better candidate for carotid stenting.  The risks and benefits were discussed with the patient which include but are not limited to stroke, access site complications, and cardiac.  I'm going to start the patient on Plavix.  It is okay for him to take a baby aspirin as well.  He needs dual antiplatelet therapy for carotid stenting.  From a scheduling perspective I had this tentatively scheduled for either Tuesday or Wednesday of next week.  It is okay for the patient to be discharged home from my perspective.  I did discuss this with the patient and his wife who is at the bedside.     Eldridge Abrahams, M.D. [facility] Office: [phone] Pager:  [phone]

## 2015-03-19 NOTE — Progress Notes (Signed)
Initial Nutrition Assessment  DOCUMENTATION CODES:  Not applicable  INTERVENTION:  Ensure Enlive (each supplement provides 350kcal and 20 grams of protein), Boost Breeze, Snacks, Other (Comment) Wellsite geologist Breakfast)  Recommend ordering Biotene mouth gel/wash  NUTRITION DIAGNOSIS:  Inadequate oral intake related to mouth pain as evidenced by 19 percent weight loss, per patient/family report.  GOAL:  Patient will meet greater than or equal to 90% of their needs, Weight gain   MONITOR:  PO intake, Supplement acceptance, Labs, Weight trends  REASON FOR ASSESSMENT:  Malnutrition Screening Tool    ASSESSMENT: 74 y.o. male history of salivary gland cancer, hypertension, hyperlipidemia, diabetes mellitus, COPD, coronary artery disease and peripheral vascular disease, presenting with recurrent spells of speech output difficulty and dysarthria, as well as lower extremity weakness, primarily right lower extremity involvement.   Pt out of room at time of visit; pt downstairs for two procedures per pt's wife who provided history. Per wife pt eats very little due to oral pain/soreness; pt uses Biotene mouth gel which helps pt eat better. Per wife, pt drinks Boost Breeze up to four times per day and will also drink carnation instant breakfast and eat jello. Per wife, plan is for pt to have teeth extracted to get dentures.  Per weight history, pt has lost 19% of his body weight in less than 6 months and 8% of his weight in the past 9 weeks.   Labs: low hemoglobin, low potassium, elevated BUN  Height:  Ht Readings from Last 1 Encounters:  03/19/15 5\' 9"  (1.753 m)    Weight:  Wt Readings from Last 1 Encounters:  03/19/15 133 lb 2.5 oz (60.4 kg)    Ideal Body Weight:  72.7 kg  Wt Readings from Last 10 Encounters:  03/19/15 133 lb 2.5 oz (60.4 kg)  01/10/15 145 lb 12 oz (66.112 kg)  12/10/14 148 lb (67.132 kg)  11/12/14 152 lb (68.947 kg)  10/10/14 165 lb 12.8 oz (75.206  kg)  10/01/14 165 lb 1.6 oz (74.889 kg)  09/24/14 166 lb 11.2 oz (75.615 kg)  08/29/14 161 lb 9.6 oz (73.301 kg)  08/09/14 163 lb (73.936 kg)  07/25/14 159 lb (72.122 kg)    BMI:  Body mass index is 19.66 kg/(m^2).  Estimated Nutritional Needs:  Kcal:  2100-2300  Protein:  90-100 grams  Fluid:  2.1 L/day  Skin:  Reviewed, no issues  Diet Order:  Diet heart healthy/carb modified Room service appropriate?: Yes; Fluid consistency:: Thin  EDUCATION NEEDS:  No education needs identified at this time   Intake/Output Summary (Last 24 hours) at 03/19/15 1242 Last data filed at 03/19/15 1134  Gross per 24 hour  Intake      0 ml  Output    525 ml  Net   -525 ml    Last BM:  PTA  Pryor Ochoa RD, LDN Inpatient Clinical Dietitian Pager: [phone] After Hours Pager: [phone]

## 2015-03-19 NOTE — Progress Notes (Signed)
  Echocardiogram 2D Echocardiogram has been performed.  [name] 03/19/2015, 12:04 PM

## 2015-03-19 NOTE — Progress Notes (Signed)
Pt arrived to 4N24 via [place]. Pt is alert and oriented.  Pt states the weakness in his right leg comes and goes.  Pt states he has lost 25lbs since January since he is unable to eat solid foods.  Pt states he is in the process of getting dentures.  Tele applied and safety measures in place.  Will continue to monitor.   Fredrich Romans, RN

## 2015-03-19 NOTE — ED Notes (Signed)
Report given to [facility] with [facility]

## 2015-03-19 NOTE — Progress Notes (Addendum)
VASCULAR LAB PRELIMINARY  PRELIMINARY  PRELIMINARY  PRELIMINARY  Carotid duplex completed.    Preliminary report:  Right - 1% to 39% upper end of scale ICA stenosis. ECA stenosis. Vertebral artery flow is antegrade. Left - Greater than 80% ICA stenosis at the bulb/ proximal ICA and decrease to 1% to 39% upper end of scale In the mid ICA. ECA stenosis. Vertebral artery flow is antegrade.  Elif Yonts, Bland, RVS 03/19/2015, 12:23 PM

## 2015-03-19 NOTE — Progress Notes (Addendum)
Patient Demographics  [name], is a 74 y.o. male, DOB - [birth year], VOH:[id number]  Admit date - 03/18/2015   Admitting Physician Rise Patience, MD  Outpatient Primary MD for the patient is Walker Kehr, MD  LOS - 1   Chief Complaint  Patient presents with  . Extremity Weakness       Admission HPI/Brief narrative: [name] is a 74 y.o. male with hisotry of CAD COPD HTN Hyperlipidemia saliva gland cancer and DM presents with TIA. Presents with right lower extremity weakness , recurrent episodes of speech difficulty. Patient was admitted for further workup, and by in neurology, MRI showing no acute finding, only remote CVA, MRA showing no large vessel occlusion,   Subjective:   [name] today has, No headache, No chest pain, No abdominal pain - No Nausea, reports lower extremity weakness and slurred speech has resolved, No Cough - SOB.   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 - Carotid Doppler: Right - 40% to 59% ICA stenosis. ECA stenosis. Vertebral artery flow is antegrade. Left - Greater than 80% ICA stenosis at the bulb/ proximal ICA and decrease to 40% to 59% In the mid ICA. ECA stenosis. Vertebral artery flow is antegrade, consulted vascular surgery Dr. Malachy Chamber regarding this finding, he'll be seen today or early in the morning. - Normal EEG - LDL 40, goal less than 70 - Glycohemoglobin A1c 5.5 in March 2016 - On aspirin 325 mg oral daily, was on 81 mg at home  Hypertension - Continue home medication  CKD - Baseline creatinine 2.2,  today is 2.5, continue to monitor, avoid nephrotoxic medication.  Hyperlipidemia -Continue with statin  Diabetes mellitus - Hemoglobin A1c is 5.5 in March 2016 - Continue insulin sliding scale  Tobacco abuse - Consult  COPD with emphysema - No wheezing  Code Status: full  Family Communication: wife at bedside  Disposition Plan: Pending PT/OT evaluation, pending further workup   Procedures  EEG 2D Echo   Consults   neurology Vascular surgery  Medications  Scheduled Meds: . aspirin  325 mg Oral Daily  . atorvastatin  10 mg Oral Daily  . calcium-vitamin D  1 tablet Oral BID WC  . cholecalciferol  2,000 Units Oral BID  . cloNIDine  0.1 mg Oral BID  . [START ON 03/20/2015] feeding supplement (ENSURE ENLIVE)  237 mL Oral Q24H  . feeding supplement (RESOURCE BREEZE)  1 Container Oral TID PC & HS  . FLUoxetine  10 mg Oral Daily  . gabapentin  100 mg Oral BID  . glimepiride  1 mg Oral BID WC  . heparin  5,000 Units Subcutaneous 3 times per day  . iron polysaccharides  150 mg Oral Daily  . labetalol  100 mg Oral BID  . magnesium oxide  400 mg Oral Daily  . pantoprazole  80 mg Oral Daily  . sodium bicarbonate  1,300 mg  Oral q morning - 10a  . Umeclidinium-Vilanterol  1 puff Inhalation Daily  . zolpidem  5 mg Oral QHS   Continuous Infusions:  PRN Meds:.acetaminophen, ALPRAZolam, mirtazapine  DVT Prophylaxis Heparin - Lab Results  Component Value Date   PLT 204 03/19/2015    Antibiotics    Anti-infectives    None          Objective:   Filed Vitals:   03/19/15 0700 03/19/15 0900 03/19/15 1100 03/19/15 1300  BP: 158/75 139/55 145/55 146/56  Pulse: 71  68 67  Temp:      TempSrc:      Resp: 18 18 16 16   Height:      Weight:      SpO2: 99% 98%      Wt Readings from Last 3 Encounters:  03/19/15 60.4 kg (133 lb 2.5 oz)  01/10/15 66.112 kg (145 lb 12 oz)  12/10/14 67.132 kg (148 lb)     Intake/Output Summary (Last 24 hours) at 03/19/15 1553 Last  data filed at 03/19/15 1134  Gross per 24 hour  Intake      0 ml  Output    525 ml  Net   -525 ml     Physical Exam  Awake Alert, Oriented X 3, No new F.N deficits, Normal affect [place].AT,PERRAL Supple Neck,No JVD, No cervical lymphadenopathy appriciated.  Symmetrical Chest wall movement, Good air movement bilaterally, CTAB RRR,No Gallops,Rubs or new Murmurs, No Parasternal Heave +ve B.Sounds, Abd Soft, No tenderness, No organomegaly appriciated, No rebound - guarding or rigidity. No Cyanosis, Clubbing or edema, No new Rash or bruise     Data Review   Micro Results No results found for this or any previous visit (from the past 240 hour(s)).  Radiology Reports Ct Head Wo Contrast  03/18/2015   CLINICAL DATA:  RIGHT leg weakness twice today, with difficulty speaking both times, speech now clear, past history hypertension, diabetes, LEFT parotid cancer  EXAM: CT HEAD WITHOUT CONTRAST  TECHNIQUE: Contiguous axial images were obtained from the base of the skull through the vertex without intravenous contrast.  COMPARISON:  03/22/2013  FINDINGS: Generalized atrophy.  Normal ventricular morphology.  No midline shift or mass effect.  Small vessel chronic ischemic changes of deep cerebral white matter.  Periventricular white matter infarct LEFT parietal.  No definite intracranial hemorrhage, mass lesion, or evidence acute infarction.  No extra-axial fluid collections.  Atherosclerotic calcification at carotid siphons.  Bones demineralized.  Minimal fluid within a LEFT ethmoid air cell.  Remaining sinuses and mastoid air cells clear.  IMPRESSION: Atrophy with small vessel chronic ischemic changes of deep cerebral white matter.  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  No acute intracranial abnormalities.   Electronically Signed   By: Lavonia Dana M.D.   On: 03/18/2015 22:25   Mri Brain Without Contrast  03/19/2015   CLINICAL DATA:  RIGHT lower extremity weakness, acute onset this  morning. Acute onset intermittent speech difficulties.  EXAM: MRI HEAD WITHOUT CONTRAST  MRA HEAD WITHOUT CONTRAST  TECHNIQUE: Multiplanar, multiecho pulse sequences of the brain and surrounding structures were obtained without intravenous contrast. Angiographic images of the head were obtained using MRA technique without contrast.  COMPARISON:  CT of the head Mar 18, 2015 at 22:04 hours  FINDINGS: MRI HEAD FINDINGS  The ventricles and sulci are normal for patient's age. No abnormal parenchymal signal, mass lesions, mass effect. Remote LEFT corona radiata cystic lacunar infarct. Patchy to confluent supratentorial, to lesser extent pontine  white matter FLAIR T2 hyperintensities. No reduced diffusion to suggest acute ischemia. Scattered tiny supra and infratentorial foci of susceptibility artifact.  No abnormal extra-axial fluid collections. No extra-axial masses though, contrast enhanced sequences would be more sensitive. Normal major intracranial vascular flow voids seen at the skull base.  Ocular globes and orbital contents are nonsuspicious though not tailored for evaluation. Bilateral ocular lens implants. No abnormal sellar expansion. LEFT posterior ethmoid mucosal thickening, small RIGHT maxillary mucosal retention cyst. Small LEFT mastoid effusion. No suspicious calvarial bone marrow signal. Generalized bright T1 bone marrow signal most consistent with osteopenia. No abnormal sellar expansion. Craniocervical junction maintained. Subcentimeter LEFT nasopharyngeal mucosal retention cyst.  MRA HEAD FINDINGS  Anterior circulation: Normal flow related enhancement of the included cervical, petrous, cavernous and supra clinoid internal carotid arteries. Patent anterior communicating artery. Normal flow related enhancement of the anterior and middle cerebral arteries, including more distal segments. Mild luminal regularity of the proximal anterior and middle cerebral arteries.  No large vessel occlusion, high-grade  stenosis, aneurysm.  Posterior circulation: RIGHT vertebral artery is dominant. The LEFT vertebral artery predominantly terminates in the posterior inferior cerebellar artery. Basilar artery is patent, with normal flow related enhancement of the main branch vessels. Robust bilateral posterior communicating arteries present. Normal flow related enhancement of the posterior cerebral arteries.  No large vessel occlusion, high-grade stenosis, aneurysm. Mild luminal regularity of the proximal posterior cerebral arteries.  IMPRESSION: MRI HEAD: No acute intracranial process, specifically no acute ischemia.  Involutional changes. Remote LEFT corona radiata lacunar infarct. Moderate to severe white matter changes suggest chronic small vessel ischemic disease.  Scattered tiny foci of susceptibility artifact can be seen with chronic hypertension though are nonspecific.  MRA HEAD: No acute large vessel occlusion or high-grade stenosis. Complete circle of Willis.  Mild luminal regularity of the proximal cerebral arteries most consistent with atherosclerosis.   Electronically Signed   By: Elon Alas   On: 03/19/2015 05:55   Mr Jodene Nam Head/brain Wo Cm  03/19/2015   CLINICAL DATA:  RIGHT lower extremity weakness, acute onset this morning. Acute onset intermittent speech difficulties.  EXAM: MRI HEAD WITHOUT CONTRAST  MRA HEAD WITHOUT CONTRAST  TECHNIQUE: Multiplanar, multiecho pulse sequences of the brain and surrounding structures were obtained without intravenous contrast. Angiographic images of the head were obtained using MRA technique without contrast.  COMPARISON:  CT of the head Mar 18, 2015 at 22:04 hours  FINDINGS: MRI HEAD FINDINGS  The ventricles and sulci are normal for patient's age. No abnormal parenchymal signal, mass lesions, mass effect. Remote LEFT corona radiata cystic lacunar infarct. Patchy to confluent supratentorial, to lesser extent pontine white matter FLAIR T2 hyperintensities. No reduced diffusion  to suggest acute ischemia. Scattered tiny supra and infratentorial foci of susceptibility artifact.  No abnormal extra-axial fluid collections. No extra-axial masses though, contrast enhanced sequences would be more sensitive. Normal major intracranial vascular flow voids seen at the skull base.  Ocular globes and orbital contents are nonsuspicious though not tailored for evaluation. Bilateral ocular lens implants. No abnormal sellar expansion. LEFT posterior ethmoid mucosal thickening, small RIGHT maxillary mucosal retention cyst. Small LEFT mastoid effusion. No suspicious calvarial bone marrow signal. Generalized bright T1 bone marrow signal most consistent with osteopenia. No abnormal sellar expansion. Craniocervical junction maintained. Subcentimeter LEFT nasopharyngeal mucosal retention cyst.  MRA HEAD FINDINGS  Anterior circulation: Normal flow related enhancement of the included cervical, petrous, cavernous and supra clinoid internal carotid arteries. Patent anterior communicating artery. Normal flow related enhancement of the  anterior and middle cerebral arteries, including more distal segments. Mild luminal regularity of the proximal anterior and middle cerebral arteries.  No large vessel occlusion, high-grade stenosis, aneurysm.  Posterior circulation: RIGHT vertebral artery is dominant. The LEFT vertebral artery predominantly terminates in the posterior inferior cerebellar artery. Basilar artery is patent, with normal flow related enhancement of the main branch vessels. Robust bilateral posterior communicating arteries present. Normal flow related enhancement of the posterior cerebral arteries.  No large vessel occlusion, high-grade stenosis, aneurysm. Mild luminal regularity of the proximal posterior cerebral arteries.  IMPRESSION: MRI HEAD: No acute intracranial process, specifically no acute ischemia.  Involutional changes. Remote LEFT corona radiata lacunar infarct. Moderate to severe white matter  changes suggest chronic small vessel ischemic disease.  Scattered tiny foci of susceptibility artifact can be seen with chronic hypertension though are nonspecific.  MRA HEAD: No acute large vessel occlusion or high-grade stenosis. Complete circle of Willis.  Mild luminal regularity of the proximal cerebral arteries most consistent with atherosclerosis.   Electronically Signed   By: Elon Alas   On: 03/19/2015 05:55     CBC  Recent Labs Lab 03/18/15 2124 03/18/15 2133 03/19/15 0340  WBC 14.6*  --  9.5  HGB 11.1* 12.2* 10.3*  HCT 33.7* 36.0* 31.8*  PLT 204  --  204  MCV 93.4  --  92.2  MCH 30.7  --  29.9  MCHC 32.9  --  32.4  RDW 14.7  --  14.9  LYMPHSABS 1.1  --   --   MONOABS 0.9  --   --   EOSABS 0.5  --   --   BASOSABS 0.0  --   --     Chemistries   Recent Labs Lab 03/18/15 2124 03/18/15 2133 03/19/15 0340  NA 137 142  --   K 2.9* 3.0*  --   CL 112* 111  --   CO2 16*  --   --   GLUCOSE 71 66  --   BUN 63* 58*  --   CREATININE 2.42* 2.70* 2.53*  CALCIUM 9.0  --   --   AST 24  --   --   ALT 32  --   --   ALKPHOS 115  --   --   BILITOT 0.5  --   --    ------------------------------------------------------------------------------------------------------------------ estimated creatinine clearance is 22.2 mL/min (by C-G formula based on Cr of 2.53). ------------------------------------------------------------------------------------------------------------------ No results for input(s): HGBA1C in the last 72 hours. ------------------------------------------------------------------------------------------------------------------  Recent Labs  03/19/15 0340  CHOL 113  HDL 24*  LDLCALC 40  TRIG 245*  CHOLHDL 4.7   ------------------------------------------------------------------------------------------------------------------ No results for input(s): TSH, T4TOTAL, T3FREE, THYROIDAB in the last 72 hours.  Invalid input(s):  FREET3 ------------------------------------------------------------------------------------------------------------------ No results for input(s): VITAMINB12, FOLATE, FERRITIN, TIBC, IRON, RETICCTPCT in the last 72 hours.  Coagulation profile  Recent Labs Lab 03/18/15 2124  INR 1.12    No results for input(s): DDIMER in the last 72 hours.  Cardiac Enzymes No results for input(s): CKMB, TROPONINI, MYOGLOBIN in the last 168 hours.  Invalid input(s): CK ------------------------------------------------------------------------------------------------------------------ Invalid input(s): POCBNP     Time Spent in minutes   30 minutes   Duc Crocket M.D on 03/19/2015 at 3:53 PM  Between 7am to 7pm - Pager - [phone]  After 7pm go to [email or web address] - password [email or web address]  [facility]   Office  [phone]

## 2015-03-19 NOTE — Progress Notes (Signed)
Pt coughed up red secretions into a cup.  Pt was drinking a red colored boost.  RN unable to tell if it is blood or red colored sputum from the drink. RN took away the drink to see he continues to cough up the same color.  Will continue to monitor.   Fredrich Romans, RN

## 2015-03-19 NOTE — Progress Notes (Signed)
EEG Completed; Results Pending  

## 2015-03-19 NOTE — Consult Note (Signed)
Admission H&P    Chief Complaint: Recurrent, transient speech difficulty and lower extremity weakness.  HPI: [name] is an 74 y.o. male history of hypertension, hyperlipidemia, diabetes mellitus, COPD, coronary artery disease and peripheral vascular disease, presenting with recurrent spells of speech output difficulty and dysarthria, as well as lower extremity weakness, primarily right lower extremity involvement. He's been taking aspirin daily for antiplatelet therapy. CT of his head showed no acute intracranial abnormality. MRI showed no acute findings, as well. A remote left corona radiata, infarction was noted. MRA showed no large vessel occlusion or high-grade stenosis. Deficits resolved with each occurrence of weakness and speech difficulty.  LSN: 7:30 PM on 03/18/2015 tPA Given: No: Deficits resolved. mRankin:  Past Medical History  Diagnosis Date  . Hypertension   . Anxiety   . COPD (chronic obstructive pulmonary disease)   . Hyperlipemia   . GERD (gastroesophageal reflux disease)   . Barrett's esophagus with high grade dysplasia     s/p RFA  . PVD (peripheral vascular disease)   . CAD (coronary artery disease)   . Tobacco user   . Depression   . Hiatal hernia   . Myocardial infarction 12/1996  . AAA (abdominal aortic aneurysm)     [facility], safe  . Renal insufficiency 2010  . Bruises easily     on hands  . Pneumonia 06/22/14  . Type II diabetes mellitus   . 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 Cancer     PAROTID/LYMPH NODE CANCER LEFT SIDE  . History of radiation therapy 09/23/14-11/12/14    left neck/parotid bed 66 Gy 33 fx    Past Surgical History  Procedure Laterality Date  . Tonsillectomy    . Nissen fundoplication    . Abdominal aortic aneurysm repair  2010    and right common iliac artery aneurysm, DR HAYES  . Upper gastrointestinal endoscopy  07/07/2011    barretts esophagus, hiatal hernia, gastritis, ablations in esophagus   . Colonoscopy  05/07/2005    diverticulosis, internal and external hemorrhoids  . Radiofrequency ablation  multiple    Barrett's, high-grade dysplasia  . Colonoscopy N/A 03/01/2013    Procedure: COLONOSCOPY;  Surgeon: Gatha Mayer, MD;  Location: [facility];  Service: Endoscopy;  Laterality: N/A;  . Coronary angioplasty with stent placement  1998    "1"  . Parotidectomy w/ neck dissection total Left 08/13/2014    PARTIAL NECK DISSECTION  . Hernia repair    . Parotidectomy Left 08/13/2014    Procedure: PAROTIDECTOMY WITH LIMITED LEFT NECK DISSECTION;  Surgeon: Rozetta Nunnery, MD;  Location: [facility] OR;  Service: ENT;  Laterality: Left;    Family History  Problem Relation Age of Onset  . Coronary artery disease Other   . Diabetes Other   . Asthma Other   . Mental illness Mother     dementia  . Cancer Mother   . Diabetes Mother   . Heart disease Father   . Diabetes Father   . Stomach cancer Father   . Cancer Father   . Colon cancer Neg Hx   . Esophageal cancer Neg Hx   . Heart attack Neg Hx   . Stroke Neg Hx    Social History:  reports that he quit smoking about 10 months ago. His smoking use included Cigarettes. He has a 58 pack-year smoking history. He has never used smokeless tobacco. He reports that he does not drink alcohol or use  illicit drugs.  Allergies:  Allergies  Allergen Reactions  . Amlodipine Besylate Swelling  . Benazepril Hcl Other (See Comments)    Elevates potassium levels  . Risperidone Other (See Comments)    loss of motor skills  . Tramadol Nausea And Vomiting  . Valsartan Other (See Comments)    Elevates potassium levels  . Penicillins Itching and Rash    Medications Prior to Admission  Medication Sig Dispense Refill  . ALPRAZolam (XANAX) 0.5 MG tablet TAKE 1 TABLET TWICE A DAY AS NEEDED 60 tablet 3  . aspirin EC 81 MG tablet Take 81 mg by mouth every morning.     Marland Kitchen atorvastatin (LIPITOR) 10 MG tablet Take 10 mg by mouth daily.     .  calcium-vitamin D (OSCAL WITH D) 500-200 MG-UNIT per tablet Take 1 tablet by mouth 2 (two) times daily.     . cholecalciferol (VITAMIN D) 1000 UNITS tablet Take 2,000 Units by mouth 2 (two) times daily.     . cloNIDine (CATAPRES) 0.1 MG tablet Take 1 tablet (0.1 mg total) by mouth 2 (two) times daily. 60 tablet 3  . FLUoxetine (PROZAC) 10 MG capsule Take 1 capsule (10 mg total) by mouth daily. 90 capsule 3  . gabapentin (NEURONTIN) 100 MG capsule Take 1 capsule (100 mg total) by mouth 2 (two) times daily. 180 capsule 3  . glimepiride (AMARYL) 1 MG tablet Take 1 tablet (1 mg total) by mouth 2 (two) times daily. 180 tablet 3  . iron polysaccharides (NIFEREX) 150 MG capsule Take 1 capsule (150 mg total) by mouth daily. 30 capsule 2  . labetalol (NORMODYNE) 200 MG tablet Take 0.5 tablets (100 mg total) by mouth 3 (three) times daily. (Patient taking differently: Take 100 mg by mouth 2 (two) times daily. Take 0.5 tablet (100 mg) in the am & Take 1 tablet (200 mg) at bedtime.) 270 tablet 2  . Magnesium 300 MG CAPS Take 300 mg by mouth daily.    . mirtazapine (REMERON) 15 MG tablet Take 1-2 tablets (15-30 mg total) by mouth at bedtime as needed (for sleep). 90 tablet 3  . Multiple Vitamin (MULTIVITAMIN WITH MINERALS) TABS tablet Take 1 tablet by mouth every morning.    Marland Kitchen omeprazole (PRILOSEC) 40 MG capsule Take 1 capsule (40 mg total) by mouth 2 (two) times daily. 180 capsule 3  . sodium bicarbonate 650 MG tablet Take 1,300 mg by mouth every morning.     . temazepam (RESTORIL) 30 MG capsule Take 30 mg by mouth at bedtime.     Marland Kitchen Umeclidinium-Vilanterol (ANORO ELLIPTA) 62.5-25 MCG/INH AEPB Inhale 1 puff into the lungs daily.    Marland Kitchen glucose blood (ONE TOUCH TEST STRIPS) test strip Use as instructed 300 each 3  . ONETOUCH DELICA LANCETS FINE MISC       ROS: History obtained from the patient and chart review.  General ROS: negative for - chills, fatigue, fever, night sweats, weight gain or weight  loss Psychological ROS: negative for - behavioral disorder, hallucinations, memory difficulties, mood swings or suicidal ideation Ophthalmic ROS: negative for - blurry vision, double vision, eye pain or loss of vision ENT ROS: negative for - epistaxis, nasal discharge, oral lesions, sore throat, tinnitus or vertigo Allergy and Immunology ROS: negative for - hives or itchy/watery eyes Hematological and Lymphatic ROS: negative for - bleeding problems, bruising or swollen lymph nodes Endocrine ROS: negative for - galactorrhea, hair pattern changes, polydipsia/polyuria or temperature intolerance Respiratory ROS: negative for - cough, hemoptysis,  shortness of breath or wheezing Cardiovascular ROS: negative for - chest pain, dyspnea on exertion, edema or irregular heartbeat Gastrointestinal ROS: negative for - abdominal pain, diarrhea, hematemesis, nausea/vomiting or stool incontinence Genito-Urinary ROS: negative for - dysuria, hematuria, incontinence or urinary frequency/urgency Musculoskeletal ROS: negative for - joint swelling or muscular weakness Neurological ROS: as noted in HPI Dermatological ROS: negative for rash and skin lesion changes  Physical Examination: Blood pressure 179/76, pulse 73, temperature 98.1 F (36.7 C), temperature source Oral, resp. rate 16, height _0  (1.753 m), weight 60.4 kg (133 lb 2.5 oz), SpO2 99 %.  HEENT-  Normocephalic, no lesions, without obvious abnormality.  Normal external eye and conjunctiva.  Normal TM's bilaterally.  Normal auditory canals and external ears. Normal external nose, mucus membranes and septum.  Normal pharynx. Neck supple with no masses, nodes, nodules or enlargement. Cardiovascular - regular rate and rhythm, S1, S2 normal, no murmur, click, rub or gallop Lungs - chest clear, no wheezing, rales, normal symmetric air entry Abdomen - soft, non-tender; bowel sounds normal; no masses,  no organomegaly Extremities - no joint deformities,  effusion, or inflammation and no edema  Neurologic Examination: Mental Status: Alert, oriented, thought content appropriate.  Speech fluent without evidence of aphasia. Able to follow commands without difficulty. Cranial Nerves: II-Visual fields were normal. III/IV/VI-Pupils were equal and reacted normally to light. Extraocular movements were full and conjugate.    V/VII-no facial numbness and no facial weakness. VIII-normal. X-normal speech and symmetrical palatal movement. XI: trapezius strength/neck flexion strength normal bilaterally XII-midline tongue extension with normal strength. Motor: 5/5 bilaterally with normal tone and bulk Sensory: Normal throughout. Deep Tendon Reflexes: 1+ and symmetric. Plantars: Mute bilaterally Cerebellar: Normal finger-to-nose testing except for minimal intention tremor. Carotid auscultation: Bilateral bruits, left greater than right.  Results for orders placed or performed during the hospital encounter of 03/18/15 (from the past 48 hour(s))  Ethanol     Status: None   Collection Time: 03/18/15  9:24 PM  Result Value Ref Range   Alcohol, Ethyl (B) <5 <5 mg/dL    Comment:        LOWEST DETECTABLE LIMIT FOR SERUM ALCOHOL IS 11 mg/dL FOR MEDICAL PURPOSES ONLY   Protime-INR     Status: None   Collection Time: 03/18/15  9:24 PM  Result Value Ref Range   Prothrombin Time 14.6 11.6 - 15.2 seconds   INR 1.12 0.00 - 1.49  APTT     Status: None   Collection Time: 03/18/15  9:24 PM  Result Value Ref Range   aPTT 33 24 - 37 seconds  CBC     Status: Abnormal   Collection Time: 03/18/15  9:24 PM  Result Value Ref Range   WBC 14.6 (H) 4.0 - 10.5 K/uL   RBC 3.61 (L) 4.22 - 5.81 MIL/uL   Hemoglobin 11.1 (L) 13.0 - 17.0 g/dL   HCT 33.7 (L) 39.0 - 52.0 %   MCV 93.4 78.0 - 100.0 fL   MCH 30.7 26.0 - 34.0 pg   MCHC 32.9 30.0 - 36.0 g/dL   RDW 14.7 11.5 - 15.5 %   Platelets 204 150 - 400 K/uL  Differential     Status: Abnormal   Collection Time:  03/18/15  9:24 PM  Result Value Ref Range   Neutrophils Relative % 82 (H) 43 - 77 %   Neutro Abs 12.0 (H) 1.7 - 7.7 K/uL   Lymphocytes Relative 8 (L) 12 - 46 %   Lymphs Abs 1.1  0.7 - 4.0 K/uL   Monocytes Relative 7 3 - 12 %   Monocytes Absolute 0.9 0.1 - 1.0 K/uL   Eosinophils Relative 3 0 - 5 %   Eosinophils Absolute 0.5 0.0 - 0.7 K/uL   Basophils Relative 0 0 - 1 %   Basophils Absolute 0.0 0.0 - 0.1 K/uL  Comprehensive metabolic panel     Status: Abnormal   Collection Time: 03/18/15  9:24 PM  Result Value Ref Range   Sodium 137 135 - 145 mmol/L   Potassium 2.9 (L) 3.5 - 5.1 mmol/L   Chloride 112 (H) 101 - 111 mmol/L   CO2 16 (L) 22 - 32 mmol/L   Glucose, Bld 71 65 - 99 mg/dL   BUN 63 (H) 6 - 20 mg/dL   Creatinine, Ser 2.42 (H) 0.61 - 1.24 mg/dL   Calcium 9.0 8.9 - 10.3 mg/dL   Total Protein 8.0 6.5 - 8.1 g/dL   Albumin 4.2 3.5 - 5.0 g/dL   AST 24 15 - 41 U/L   ALT 32 17 - 63 U/L   Alkaline Phosphatase 115 38 - 126 U/L   Total Bilirubin 0.5 0.3 - 1.2 mg/dL   GFR calc non Af Amer 25 (L) >60 mL/min   GFR calc Af Amer 29 (L) >60 mL/min    Comment: (NOTE) The eGFR has been calculated using the CKD EPI equation. This calculation has not been validated in all clinical situations. eGFR's persistently <60 mL/min signify possible Chronic Kidney Disease.    Anion gap 9 5 - 15  I-stat troponin, ED (not at [facility], [facility])     Status: None   Collection Time: 03/18/15  9:30 PM  Result Value Ref Range   Troponin i, poc 0.00 0.00 - 0.08 ng/mL   Comment 3            Comment: Due to the release kinetics of cTnI, a negative result within the first hours of the onset of symptoms does not rule out myocardial infarction with certainty. If myocardial infarction is still suspected, repeat the test at appropriate intervals.   I-Stat Chem 8, ED  (not at [facility], [facility])     Status: Abnormal   Collection Time: 03/18/15  9:33 PM  Result Value Ref Range   Sodium 142 135 - 145 mmol/L   Potassium 3.0  (L) 3.5 - 5.1 mmol/L   Chloride 111 101 - 111 mmol/L   BUN 58 (H) 6 - 20 mg/dL   Creatinine, Ser 2.70 (H) 0.61 - 1.24 mg/dL   Glucose, Bld 66 65 - 99 mg/dL   Calcium, Ion 1.28 1.13 - 1.30 mmol/L   TCO2 13 0 - 100 mmol/L   Hemoglobin 12.2 (L) 13.0 - 17.0 g/dL   HCT 36.0 (L) 39.0 - 52.0 %  Urine Drug Screen     Status: Abnormal   Collection Time: 03/18/15 10:12 PM  Result Value Ref Range   Opiates NONE DETECTED NONE DETECTED   Cocaine NONE DETECTED NONE DETECTED   Benzodiazepines POSITIVE (A) NONE DETECTED   Amphetamines NONE DETECTED NONE DETECTED   Tetrahydrocannabinol NONE DETECTED NONE DETECTED   Barbiturates NONE DETECTED NONE DETECTED    Comment:        DRUG SCREEN FOR MEDICAL PURPOSES ONLY.  IF CONFIRMATION IS NEEDED FOR ANY PURPOSE, NOTIFY LAB WITHIN 5 DAYS.        LOWEST DETECTABLE LIMITS FOR URINE DRUG SCREEN Drug Class       Cutoff (ng/mL) Amphetamine  1000 Barbiturate      200 Benzodiazepine   250 Tricyclics       539 Opiates          300 Cocaine          300 THC              50   Urinalysis, Routine w reflex microscopic     Status: None   Collection Time: 03/18/15 10:12 PM  Result Value Ref Range   Color, Urine YELLOW YELLOW   APPearance CLEAR CLEAR   Specific Gravity, Urine 1.012 1.005 - 1.030   pH 5.5 5.0 - 8.0   Glucose, UA NEGATIVE NEGATIVE mg/dL   Hgb urine dipstick NEGATIVE NEGATIVE   Bilirubin Urine NEGATIVE NEGATIVE   Ketones, ur NEGATIVE NEGATIVE mg/dL   Protein, ur NEGATIVE NEGATIVE mg/dL   Urobilinogen, UA 0.2 0.0 - 1.0 mg/dL   Nitrite NEGATIVE NEGATIVE   Leukocytes, UA NEGATIVE NEGATIVE    Comment: MICROSCOPIC NOT DONE ON URINES WITH NEGATIVE PROTEIN, BLOOD, LEUKOCYTES, NITRITE, OR GLUCOSE <1000 mg/dL.  Urine rapid drug screen (hosp performed)     Status: Abnormal   Collection Time: 03/19/15  3:29 AM  Result Value Ref Range   Opiates NONE DETECTED NONE DETECTED   Cocaine NONE DETECTED NONE DETECTED   Benzodiazepines POSITIVE (A) NONE  DETECTED   Amphetamines NONE DETECTED NONE DETECTED   Tetrahydrocannabinol NONE DETECTED NONE DETECTED   Barbiturates NONE DETECTED NONE DETECTED    Comment:        DRUG SCREEN FOR MEDICAL PURPOSES ONLY.  IF CONFIRMATION IS NEEDED FOR ANY PURPOSE, NOTIFY LAB WITHIN 5 DAYS.        LOWEST DETECTABLE LIMITS FOR URINE DRUG SCREEN Drug Class       Cutoff (ng/mL) Amphetamine      1000 Barbiturate      200 Benzodiazepine   767 Tricyclics       341 Opiates          300 Cocaine          300 THC              50   CBC     Status: Abnormal   Collection Time: 03/19/15  3:40 AM  Result Value Ref Range   WBC 9.5 4.0 - 10.5 K/uL   RBC 3.45 (L) 4.22 - 5.81 MIL/uL   Hemoglobin 10.3 (L) 13.0 - 17.0 g/dL   HCT 31.8 (L) 39.0 - 52.0 %   MCV 92.2 78.0 - 100.0 fL   MCH 29.9 26.0 - 34.0 pg   MCHC 32.4 30.0 - 36.0 g/dL   RDW 14.9 11.5 - 15.5 %   Platelets 204 150 - 400 K/uL  Creatinine, serum     Status: Abnormal   Collection Time: 03/19/15  3:40 AM  Result Value Ref Range   Creatinine, Ser 2.53 (H) 0.61 - 1.24 mg/dL   GFR calc non Af Amer 24 (L) >60 mL/min   GFR calc Af Amer 27 (L) >60 mL/min    Comment: (NOTE) The eGFR has been calculated using the CKD EPI equation. This calculation has not been validated in all clinical situations. eGFR's persistently <60 mL/min signify possible Chronic Kidney Disease.   Lipid panel     Status: Abnormal   Collection Time: 03/19/15  3:40 AM  Result Value Ref Range   Cholesterol 113 0 - 200 mg/dL   Triglycerides 245 (H) <150 mg/dL   HDL 24 (L) >40 mg/dL   Total  CHOL/HDL Ratio 4.7 RATIO   VLDL 49 (H) 0 - 40 mg/dL   LDL Cholesterol 40 0 - 99 mg/dL    Comment:        Total Cholesterol/HDL:CHD Risk Coronary Heart Disease Risk Table                     Men   Women  1/2 Average Risk   3.4   3.3  Average Risk       5.0   4.4  2 X Average Risk   9.6   7.1  3 X Average Risk  23.4   11.0        Use the calculated Patient Ratio above and the CHD Risk  Table to determine the patient's CHD Risk.        ATP III CLASSIFICATION (LDL):  <100     mg/dL   Optimal  100-129  mg/dL   Near or Above                    Optimal  130-159  mg/dL   Borderline  160-189  mg/dL   High  >190     mg/dL   Very High    Ct Head Wo Contrast  03/18/2015   CLINICAL DATA:  RIGHT leg weakness twice today, with difficulty speaking both times, speech now clear, past history hypertension, diabetes, LEFT parotid cancer  EXAM: CT HEAD WITHOUT CONTRAST  TECHNIQUE: Contiguous axial images were obtained from the base of the skull through the vertex without intravenous contrast.  COMPARISON:  03/22/2013  FINDINGS: Generalized atrophy.  Normal ventricular morphology.  No midline shift or mass effect.  Small vessel chronic ischemic changes of deep cerebral white matter.  Periventricular white matter infarct LEFT parietal.  No definite intracranial hemorrhage, mass lesion, or evidence acute infarction.  No extra-axial fluid collections.  Atherosclerotic calcification at carotid siphons.  Bones demineralized.  Minimal fluid within a LEFT ethmoid air cell.  Remaining sinuses and mastoid air cells clear.  IMPRESSION: Atrophy with small vessel chronic ischemic changes of deep cerebral white matter.  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  No acute intracranial abnormalities.   Electronically Signed   By: Lavonia Dana M.D.   On: 03/18/2015 22:25   Mri Brain Without Contrast  03/19/2015   CLINICAL DATA:  RIGHT lower extremity weakness, acute onset this morning. Acute onset intermittent speech difficulties.  EXAM: MRI HEAD WITHOUT CONTRAST  MRA HEAD WITHOUT CONTRAST  TECHNIQUE: Multiplanar, multiecho pulse sequences of the brain and surrounding structures were obtained without intravenous contrast. Angiographic images of the head were obtained using MRA technique without contrast.  COMPARISON:  CT of the head Mar 18, 2015 at 22:04 hours  FINDINGS: MRI HEAD FINDINGS  The  ventricles and sulci are normal for patient's age. No abnormal parenchymal signal, mass lesions, mass effect. Remote LEFT corona radiata cystic lacunar infarct. Patchy to confluent supratentorial, to lesser extent pontine white matter FLAIR T2 hyperintensities. No reduced diffusion to suggest acute ischemia. Scattered tiny supra and infratentorial foci of susceptibility artifact.  No abnormal extra-axial fluid collections. No extra-axial masses though, contrast enhanced sequences would be more sensitive. Normal major intracranial vascular flow voids seen at the skull base.  Ocular globes and orbital contents are nonsuspicious though not tailored for evaluation. Bilateral ocular lens implants. No abnormal sellar expansion. LEFT posterior ethmoid mucosal thickening, small RIGHT maxillary mucosal retention cyst. Small LEFT mastoid effusion. No suspicious calvarial bone marrow signal.  Generalized bright T1 bone marrow signal most consistent with osteopenia. No abnormal sellar expansion. Craniocervical junction maintained. Subcentimeter LEFT nasopharyngeal mucosal retention cyst.  MRA HEAD FINDINGS  Anterior circulation: Normal flow related enhancement of the included cervical, petrous, cavernous and supra clinoid internal carotid arteries. Patent anterior communicating artery. Normal flow related enhancement of the anterior and middle cerebral arteries, including more distal segments. Mild luminal regularity of the proximal anterior and middle cerebral arteries.  No large vessel occlusion, high-grade stenosis, aneurysm.  Posterior circulation: RIGHT vertebral artery is dominant. The LEFT vertebral artery predominantly terminates in the posterior inferior cerebellar artery. Basilar artery is patent, with normal flow related enhancement of the main branch vessels. Robust bilateral posterior communicating arteries present. Normal flow related enhancement of the posterior cerebral arteries.  No large vessel occlusion,  high-grade stenosis, aneurysm. Mild luminal regularity of the proximal posterior cerebral arteries.  IMPRESSION: MRI HEAD: No acute intracranial process, specifically no acute ischemia.  Involutional changes. Remote LEFT corona radiata lacunar infarct. Moderate to severe white matter changes suggest chronic small vessel ischemic disease.  Scattered tiny foci of susceptibility artifact can be seen with chronic hypertension though are nonspecific.  MRA HEAD: No acute large vessel occlusion or high-grade stenosis. Complete circle of Willis.  Mild luminal regularity of the proximal cerebral arteries most consistent with atherosclerosis.   Electronically Signed   By: Elon Alas   On: 03/19/2015 05:55   Mr Jodene Nam Head/brain Wo Cm  03/19/2015   CLINICAL DATA:  RIGHT lower extremity weakness, acute onset this morning. Acute onset intermittent speech difficulties.  EXAM: MRI HEAD WITHOUT CONTRAST  MRA HEAD WITHOUT CONTRAST  TECHNIQUE: Multiplanar, multiecho pulse sequences of the brain and surrounding structures were obtained without intravenous contrast. Angiographic images of the head were obtained using MRA technique without contrast.  COMPARISON:  CT of the head Mar 18, 2015 at 22:04 hours  FINDINGS: MRI HEAD FINDINGS  The ventricles and sulci are normal for patient's age. No abnormal parenchymal signal, mass lesions, mass effect. Remote LEFT corona radiata cystic lacunar infarct. Patchy to confluent supratentorial, to lesser extent pontine white matter FLAIR T2 hyperintensities. No reduced diffusion to suggest acute ischemia. Scattered tiny supra and infratentorial foci of susceptibility artifact.  No abnormal extra-axial fluid collections. No extra-axial masses though, contrast enhanced sequences would be more sensitive. Normal major intracranial vascular flow voids seen at the skull base.  Ocular globes and orbital contents are nonsuspicious though not tailored for evaluation. Bilateral ocular lens implants. No  abnormal sellar expansion. LEFT posterior ethmoid mucosal thickening, small RIGHT maxillary mucosal retention cyst. Small LEFT mastoid effusion. No suspicious calvarial bone marrow signal. Generalized bright T1 bone marrow signal most consistent with osteopenia. No abnormal sellar expansion. Craniocervical junction maintained. Subcentimeter LEFT nasopharyngeal mucosal retention cyst.  MRA HEAD FINDINGS  Anterior circulation: Normal flow related enhancement of the included cervical, petrous, cavernous and supra clinoid internal carotid arteries. Patent anterior communicating artery. Normal flow related enhancement of the anterior and middle cerebral arteries, including more distal segments. Mild luminal regularity of the proximal anterior and middle cerebral arteries.  No large vessel occlusion, high-grade stenosis, aneurysm.  Posterior circulation: RIGHT vertebral artery is dominant. The LEFT vertebral artery predominantly terminates in the posterior inferior cerebellar artery. Basilar artery is patent, with normal flow related enhancement of the main branch vessels. Robust bilateral posterior communicating arteries present. Normal flow related enhancement of the posterior cerebral arteries.  No large vessel occlusion, high-grade stenosis, aneurysm. Mild luminal regularity of the proximal  posterior cerebral arteries.  IMPRESSION: MRI HEAD: No acute intracranial process, specifically no acute ischemia.  Involutional changes. Remote LEFT corona radiata lacunar infarct. Moderate to severe white matter changes suggest chronic small vessel ischemic disease.  Scattered tiny foci of susceptibility artifact can be seen with chronic hypertension though are nonspecific.  MRA HEAD: No acute large vessel occlusion or high-grade stenosis. Complete circle of Willis.  Mild luminal regularity of the proximal cerebral arteries most consistent with atherosclerosis.   Electronically Signed   By: Elon Alas   On: 03/19/2015  05:55     Assessment: 74 y.o. male with multiple risk factors for stroke presenting with probable recurrent transient ischemic attacks.  Stroke Risk Factors - diabetes mellitus, hyperlipidemia, hypertension and smoking  Plan: 1. HgbA1c, fasting lipid panel 2. PT consult, OT consult, Speech consult 3. Echocardiogram 4. Carotid dopplers 5. Prophylactic therapy-Antiplatelet med: Aspirin  6. Risk factor modification 7. Telemetry monitoring  C.R. Nicole Kindred, MD [facility] [phone]  03/19/2015, 6:17 AM

## 2015-03-19 NOTE — Progress Notes (Addendum)
STROKE TEAM PROGRESS NOTE   HISTORY Johnny HREHA is an 74 y.o. male history of hypertension, hyperlipidemia, diabetes mellitus, COPD, coronary artery disease and peripheral vascular disease, presenting with recurrent spells of speech output difficulty and dysarthria, as well as lower extremity weakness, primarily right lower extremity involvement. He's been taking aspirin daily for antiplatelet therapy. CT of his head showed no acute intracranial abnormality. MRI showed no acute findings, as well. A remote left corona radiata, infarction was noted. MRA showed no large vessel occlusion or high-grade stenosis. Deficits resolved with each occurrence of weakness and speech difficulty. He was LSN: 7:30 PM on 03/18/2015. Patient was not administered TPA secondary to resolved deficits. He was admitted for further evaluation and treatment.   SUBJECTIVE (INTERVAL HISTORY) His friend is at the bedside.  Overall he feels his condition is completely resolved. He stated that he felt right leg heaviness on waking up yesterday morning but he was able to walk. However, in the evening, wife came back found him to have some slurry speech and then he fell at home due to left leg weakness. EMS called and he was admitted. Currently, he felt his right leg weakness resolved. He denies any arm weakness or left leg weakness.    OBJECTIVE Temp:  [98 F (36.7 C)-98.2 F (36.8 C)] 98.1 F (36.7 C) (05/17 0400) Pulse Rate:  [67-73] 71 (05/17 0700) Cardiac Rhythm:  [-] Normal sinus rhythm (05/17 0807) Resp:  [11-21] 18 (05/17 0700) BP: (138-179)/(64-80) 158/75 mmHg (05/17 0700) SpO2:  [93 %-100 %] 99 % (05/17 0700) Weight:  [60.4 kg (133 lb 2.5 oz)-61.236 kg (135 lb)] 60.4 kg (133 lb 2.5 oz) (05/17 0251)   Recent Labs Lab 03/19/15 0637  GLUCAP 207*    Recent Labs Lab 03/18/15 2124 03/18/15 2133 03/19/15 0340  NA 137 142  --   K 2.9* 3.0*  --   CL 112* 111  --   CO2 16*  --   --   GLUCOSE 71 66  --   BUN  63* 58*  --   CREATININE 2.42* 2.70* 2.53*  CALCIUM 9.0  --   --     Recent Labs Lab 03/18/15 2124  AST 24  ALT 32  ALKPHOS 115  BILITOT 0.5  PROT 8.0  ALBUMIN 4.2    Recent Labs Lab 03/18/15 2124 03/18/15 2133 03/19/15 0340  WBC 14.6*  --  9.5  NEUTROABS 12.0*  --   --   HGB 11.1* 12.2* 10.3*  HCT 33.7* 36.0* 31.8*  MCV 93.4  --  92.2  PLT 204  --  204   No results for input(s): CKTOTAL, CKMB, CKMBINDEX, TROPONINI in the last 168 hours.  Recent Labs  03/18/15 2124  LABPROT 14.6  INR 1.12    Recent Labs  03/18/15 2212  COLORURINE YELLOW  LABSPEC 1.012  PHURINE 5.5  GLUCOSEU NEGATIVE  HGBUR NEGATIVE  BILIRUBINUR NEGATIVE  KETONESUR NEGATIVE  PROTEINUR NEGATIVE  UROBILINOGEN 0.2  NITRITE NEGATIVE  LEUKOCYTESUR NEGATIVE       Component Value Date/Time   CHOL 113 03/19/2015 0340   TRIG 245* 03/19/2015 0340   TRIG 135 10/13/2006 0754   HDL 24* 03/19/2015 0340   CHOLHDL 4.7 03/19/2015 0340   CHOLHDL 4.6 CALC 10/13/2006 0754   VLDL 49* 03/19/2015 0340   LDLCALC 40 03/19/2015 0340   Lab Results  Component Value Date   HGBA1C 5.5 01/10/2015      Component Value Date/Time   LABOPIA NONE DETECTED 03/19/2015 0329  COCAINSCRNUR NONE DETECTED 03/19/2015 0329   LABBENZ POSITIVE* 03/19/2015 0329   AMPHETMU NONE DETECTED 03/19/2015 0329   THCU NONE DETECTED 03/19/2015 0329   LABBARB NONE DETECTED 03/19/2015 0329     Recent Labs Lab 03/18/15 2124  ETH <5   I have personally reviewed the radiological images below and agree with the radiology interpretations.  Ct Head Wo Contrast 03/18/2015   Atrophy with small vessel chronic ischemic changes of deep cerebral white matter.  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  No acute intracranial abnormalities.     MRI HEAD 03/19/2015   No acute intracranial process, specifically no acute ischemia.  Involutional changes. Remote LEFT corona radiata lacunar infarct. Moderate to severe white  matter changes suggest chronic small vessel ischemic disease.  Scattered tiny foci of susceptibility artifact can be seen with chronic hypertension though are nonspecific.    MRA HEAD 03/19/2015    No acute large vessel occlusion or high-grade stenosis. Complete circle of Willis.  Mild luminal regularity of the proximal cerebral arteries most consistent with atherosclerosis.     CUS - Right - 1% to 39% upper end of scale ICA stenosis. ECA stenosis. Vertebral artery flow is antegrade. Left - Greater than 80% ICA stenosis at the bulb/ proximal ICA and decrease to 1% to 39% upper end of scale In the mid ICA. ECA stenosis. Vertebral artery flow is antegrade.  2D echo - - LVEF 55-60%, moderate LVH, normal wall motion, diastolic dysfunction, normal LV filling pressure, mild TR, normal RVSP.  EEG - normal EEG  PHYSICAL EXAM  Temp:  [98 F (36.7 C)-98.2 F (36.8 C)] 98.1 F (36.7 C) (05/17 0400) Pulse Rate:  [67-73] 67 (05/17 1300) Resp:  [11-21] 16 (05/17 1300) BP: (138-179)/(55-80) 146/56 mmHg (05/17 1300) SpO2:  [93 %-100 %] 98 % (05/17 0900) Weight:  [133 lb 2.5 oz (60.4 kg)-135 lb (61.236 kg)] 133 lb 2.5 oz (60.4 kg) (05/17 0251)  General - Well nourished, well developed, in no apparent distress.  Ophthalmologic - Fundi not visualized due to eye movement.  Cardiovascular - Regular rate and rhythm with no murmur.  Mental Status -  Level of arousal and orientation to time, place, and person were intact. Language including expression, naming, repetition, comprehension was assessed and found intact. Fund of Knowledge was assessed and was intact.  Cranial Nerves II - XII - II - Visual field intact OU. III, IV, VI - Extraocular movements intact. V - Facial sensation intact bilaterally. VII - Facial movement intact bilaterally. VIII - Hearing & vestibular intact bilaterally. X - Palate elevates symmetrically. XI - Chin turning & shoulder shrug intact bilaterally. XII - Tongue  protrusion intact.  Motor Strength - The patient's strength was normal in all extremities and pronator drift was absent.  Bulk was normal and fasciculations were absent.   Motor Tone - Muscle tone was assessed at the neck and appendages and was normal.  Reflexes - The patient's reflexes were 1+ in all extremities and he had no pathological reflexes.  Sensory - Light touch, temperature/pinprick, vibration and proprioception, and Romberg testing were assessed and were symmetrical.    Coordination - The patient had normal movements in the hands and feet with no ataxia or dysmetria.  Tremor was absent.  Gait and Station - deferred due to fatigue.   ASSESSMENT/PLAN Mr. DONTEZ HAUSS is a 74 y.o. male with history of  hypertension, hyperlipidemia, diabetes mellitus, COPD, coronary artery disease and peripheral vascular disease, presenting with recurrent spells  of speech output difficulty, dysarthria, right lower extremity weakness.  He did not receive IV t-PA due to resolved deficits.   TIA - left hemisphere, likely due to left ICA high grade stenosis   Resultant  Neuro deficits resolved  MRI  No acute stroke  MRA  No large vessel stenosis  Carotid Doppler  Left ICA >80% stenosis at bulb  2D Echo  unremarkable  EEG normal  LDL 40  HgbA1c 5.5  Heparin 5000 units sq tid for VTE prophylaxis  Diet heart healthy/carb modified Room service appropriate?: Yes; Fluid consistency:: Thin  aspirin 81 mg orally every day prior to admission, now on aspirin 325 mg orally every day.   Recommend vascular surgery consultation.  Patient counseled to be compliant with his antithrombotic medications  Ongoing aggressive stroke risk factor management  Carotid stenosis  Left ICA stenosis > 80% at bulb  Recommend vascular surgery consult  Avoid hypotension  BP goal 130-150 before procedure  Hypertension  Home meds:   Clonidine, labetalol   Currently on labetalol  Avoid  hypotension  BP goal 130-150 before procedure   Hyperlipidemia  Home meds:  lipitor 10,  resumed in hospital  LDL 40, goal < 70  Continue statin at discharge  DM  A1C 5.5  Under control  Other Stroke Risk Factors  Advanced age  Cigarette smoker, quit smoking 10 months ago , re-advised to stop smoking  Coronary artery disease - MI, PCI s/ stent 1998  PVD  Other Active Problems  Anxiety  COPD w/ emphysema  GERD  Barrett's esophagus  Depression  AAA repair 2010   CKD stage 3  hypokalemia  Other Pertinent History  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 After hours, contact General Neurology

## 2015-03-19 NOTE — Procedures (Signed)
ELECTROENCEPHALOGRAM REPORT   Patient: [name]       Room #: 5K35 EEG No. ID: [id number] Age: 74 y.o.        Sex: male Referring Physician: Elgergawy Report Date:  03/19/2015        Interpreting Physician: Alexis Goodell  History: [name] is an 74 y.o. male with episodes of difficulty with speech and lower extremity weakness  Medications:  Scheduled: . aspirin  325 mg Oral Daily  . atorvastatin  10 mg Oral Daily  . calcium-vitamin D  1 tablet Oral BID WC  . cholecalciferol  2,000 Units Oral BID  . cloNIDine  0.1 mg Oral BID  . feeding supplement (ENSURE ENLIVE)  237 mL Oral BID BM  . FLUoxetine  10 mg Oral Daily  . gabapentin  100 mg Oral BID  . glimepiride  1 mg Oral BID WC  . heparin  5,000 Units Subcutaneous 3 times per day  . iron polysaccharides  150 mg Oral Daily  . labetalol  100 mg Oral BID  . magnesium oxide  400 mg Oral Daily  . pantoprazole  80 mg Oral Daily  . sodium bicarbonate  1,300 mg Oral q morning - 10a  . Umeclidinium-Vilanterol  1 puff Inhalation Daily  . zolpidem  5 mg Oral QHS    Conditions of Recording:  This is a 16 channel EEG carried out with the patient in the awake, drowsy and asleep states.  Description:  The waking background activity consists of a low voltage, symmetrical, fairly well organized, 11 Hz alpha activity, seen from the parieto-occipital and posterior temporal regions.  Low voltage fast activity, poorly organized, is seen anteriorly and is at times superimposed on more posterior regions.  A mixture of theta and alpha rhythms are seen from the central and temporal regions. The patient drowses with slowing to irregular, low voltage theta and beta activity.   The patient goes in to a light sleep with symmetrical sleep spindles, vertex central sharp transients and irregular slow activity.  No epileptiform activity is noted.   Hyperventilation and intermittent photic stimulation were not performed.  IMPRESSION: Normal  electroencephalogram, awake, asleep. There are no focal lateralizing or epileptiform features.   Alexis Goodell, MD [facility] [phone] 03/19/2015, 11:24 AM

## 2015-03-20 ENCOUNTER — Other Ambulatory Visit: Payer: Self-pay | Admitting: Neurology

## 2015-03-20 DIAGNOSIS — N183 Chronic kidney disease, stage 3 (moderate): Secondary | ICD-10-CM | POA: Diagnosis not present

## 2015-03-20 DIAGNOSIS — I10 Essential (primary) hypertension: Secondary | ICD-10-CM | POA: Diagnosis not present

## 2015-03-20 DIAGNOSIS — I6522 Occlusion and stenosis of left carotid artery: Secondary | ICD-10-CM | POA: Diagnosis not present

## 2015-03-20 DIAGNOSIS — E1159 Type 2 diabetes mellitus with other circulatory complications: Secondary | ICD-10-CM | POA: Diagnosis not present

## 2015-03-20 DIAGNOSIS — G451 Carotid artery syndrome (hemispheric): Secondary | ICD-10-CM

## 2015-03-20 LAB — CBC
HEMATOCRIT: 32.2 % — AB (ref 39.0–52.0)
Hemoglobin: 10.5 g/dL — ABNORMAL LOW (ref 13.0–17.0)
MCH: 30.1 pg (ref 26.0–34.0)
MCHC: 32.6 g/dL (ref 30.0–36.0)
MCV: 92.3 fL (ref 78.0–100.0)
Platelets: 210 10*3/uL (ref 150–400)
RBC: 3.49 MIL/uL — AB (ref 4.22–5.81)
RDW: 14.9 % (ref 11.5–15.5)
WBC: 9.1 10*3/uL (ref 4.0–10.5)

## 2015-03-20 LAB — BASIC METABOLIC PANEL
ANION GAP: 10 (ref 5–15)
BUN: 55 mg/dL — AB (ref 6–20)
CHLORIDE: 114 mmol/L — AB (ref 101–111)
CO2: 17 mmol/L — ABNORMAL LOW (ref 22–32)
Calcium: 9.8 mg/dL (ref 8.9–10.3)
Creatinine, Ser: 2.17 mg/dL — ABNORMAL HIGH (ref 0.61–1.24)
GFR, EST AFRICAN AMERICAN: 33 mL/min — AB (ref >60)
GFR, EST NON AFRICAN AMERICAN: 28 mL/min — AB (ref >60)
Glucose, Bld: 104 mg/dL — ABNORMAL HIGH (ref 65–99)
POTASSIUM: 3.3 mmol/L — AB (ref 3.5–5.1)
Sodium: 141 mmol/L (ref 135–145)

## 2015-03-20 LAB — GLUCOSE, CAPILLARY
GLUCOSE-CAPILLARY: 118 mg/dL — AB (ref 65–99)
Glucose-Capillary: 117 mg/dL — ABNORMAL HIGH (ref 65–99)

## 2015-03-20 LAB — HEMOGLOBIN A1C
HEMOGLOBIN A1C: 5.3 % (ref 4.8–5.6)
Mean Plasma Glucose: 105 mg/dL

## 2015-03-20 MED ORDER — UMECLIDINIUM-VILANTEROL 62.5-25 MCG/INH IN AEPB
1.0000 | INHALATION_SPRAY | Freq: Every day | RESPIRATORY_TRACT | Status: DC
  Administered 2015-03-20: 1 via RESPIRATORY_TRACT

## 2015-03-20 MED ORDER — CLOPIDOGREL BISULFATE 75 MG PO TABS: 75.0000 mg | ORAL_TABLET | Freq: Every day | ORAL | Status: AC

## 2015-03-20 MED ORDER — CLOPIDOGREL BISULFATE 75 MG PO TABS
75.0000 mg | ORAL_TABLET | Freq: Every day | ORAL | Status: DC
  Administered 2015-03-20: 75 mg via ORAL
  Filled 2015-03-20: qty 1

## 2015-03-20 NOTE — Progress Notes (Signed)
Patient Demographics  Rui Wordell, is a 74 y.o. male, DOB - [birth year], BOF:[id number]  Admit date - 03/18/2015   Admitting Physician Rise Patience, MD  Outpatient Primary MD for the patient is Walker Kehr, MD  LOS - 2   Chief Complaint  Patient presents with  . Extremity Weakness       Admission HPI/Brief narrative: EVEREST BROD is a 74 y.o. male with hisotry of CAD COPD HTN Hyperlipidemia saliva gland cancer and DM presents with TIA. Presents with right lower extremity weakness , recurrent episodes of speech difficulty. 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 - Carotid Doppler: Left - > 80% ICA stenosis  - Dr.Brabhams consult appreciated, plan for Carotid stent on Tuesday next week  - Normal EEG - LDL 40, goal less than 70 - Glycohemoglobin A1c 5.5 in March 2016 - On aspirin 325 mg oral daily, was on 81 mg at home, Plavix added by Dr.Brabham starting today  -? Change to ASA 81mg , will await Neuro input prior to DC  H/o CAD with PCI and stent in 1998 -myoview 11/13 no ischemia  abdominal aortic aneurysm and iliac aneurysm repair January 2010 by Dr. Amedeo Plenty  Hypertension - BP on higher side, continue clonidine and  labetalol  CKD - Baseline creatinine 2.2, today is 2.17, continue to monitor, avoid nephrotoxic medication. -suspect metabolic acidosis is from CKD  Hyperlipidemia -Continue with statin  Diabetes mellitus - Hemoglobin A1c is 5.5 in March 2016 - Continue insulin sliding scale  Tobacco abuse -counseled  COPD with emphysema - stable  Code Status: full  Family Communication: wife at bedside  Disposition Plan: home later today  Procedures  EEG 2D Echo   Consults   neurology Vascular surgery  Medications  Scheduled Meds: . aspirin  325 mg Oral Daily  . atorvastatin  10 mg Oral Daily  . calcium-vitamin D  1 tablet Oral BID WC  . cholecalciferol  2,000 Units Oral BID  . cloNIDine  0.1 mg Oral BID  . clopidogrel  75 mg Oral Daily  . feeding supplement (ENSURE ENLIVE)  237 mL Oral Q24H  . feeding supplement (RESOURCE BREEZE)  1 Container Oral TID PC & HS  . FLUoxetine  10 mg Oral Daily  . gabapentin  100 mg Oral BID  . glimepiride  1 mg Oral BID WC  . heparin  5,000 Units Subcutaneous 3 times per day  . iron polysaccharides  150 mg Oral Daily  . labetalol  100 mg Oral BID  . magnesium oxide  400 mg Oral  Daily  . pantoprazole  80 mg Oral Daily  . sodium bicarbonate  1,300 mg Oral q morning - 10a  . Umeclidinium-Vilanterol  1 puff Inhalation Daily  . zolpidem  5 mg Oral QHS   Continuous Infusions:  PRN Meds:.acetaminophen, ALPRAZolam, mirtazapine  DVT Prophylaxis Heparin - Lab Results  Component Value Date   PLT 210 03/20/2015    Antibiotics    Anti-infectives    None          Objective:   Filed Vitals:   03/19/15 2120 03/20/15 0144 03/20/15 0543 03/20/15 0934  BP: 161/75 144/70 155/74 171/78  Pulse: 73 70 71 71  Temp: 99.1 F (37.3 C) 98.8 F (37.1 C) 98.6 F (37 C) 98.3 F (36.8 C)  TempSrc: Oral Oral Oral Oral  Resp: 16 16 16 20   Height:      Weight:      SpO2: 98% 98% 98% 100%    Wt Readings from Last 3 Encounters:  03/19/15 60.4  kg (133 lb 2.5 oz)  01/10/15 66.112 kg (145 lb 12 oz)  12/10/14 67.132 kg (148 lb)     Intake/Output Summary (Last 24 hours) at 03/20/15 1107 Last data filed at 03/20/15 0544  Gross per 24 hour  Intake      0 ml  Output   1350 ml  Net  -1350 ml     Physical Exam  Awake Alert, Oriented X 3, No new F.N deficits, Normal affect [place].AT,PERRAL Supple Neck,No JVD, No cervical lymphadenopathy appriciated.  Symmetrical Chest wall movement, Good air movement bilaterally, CTAB RRR,No Gallops,Rubs or new Murmurs, No Parasternal Heave +ve B.Sounds, Abd Soft, No tenderness, No organomegaly appriciated, No rebound - guarding or rigidity. No Cyanosis, Clubbing or edema, No new Rash or bruise     Data Review   Micro Results No results found for this or any previous visit (from the past 240 hour(s)).  Radiology Reports Ct Head Wo Contrast  03/18/2015   CLINICAL DATA:  RIGHT leg weakness twice today, with difficulty speaking both times, speech now clear, past history hypertension, diabetes, LEFT parotid cancer  EXAM: CT HEAD WITHOUT CONTRAST  TECHNIQUE: Contiguous axial images were obtained from the base of the skull through the vertex without intravenous contrast.  COMPARISON:  03/22/2013  FINDINGS: Generalized atrophy.  Normal ventricular morphology.  No midline shift or mass effect.  Small vessel chronic ischemic changes of deep cerebral white matter.  Periventricular white matter infarct LEFT parietal.  No definite intracranial hemorrhage, mass lesion, or evidence acute infarction.  No extra-axial fluid collections.  Atherosclerotic calcification at carotid siphons.  Bones demineralized.  Minimal fluid within a LEFT ethmoid air cell.  Remaining sinuses and mastoid air cells clear.  IMPRESSION: Atrophy with small vessel chronic ischemic changes of deep cerebral white matter.  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  No acute intracranial abnormalities.   Electronically Signed    By: Lavonia Dana M.D.   On: 03/18/2015 22:25   Mri Brain Without Contrast  03/19/2015   CLINICAL DATA:  RIGHT lower extremity weakness, acute onset this morning. Acute onset intermittent speech difficulties.  EXAM: MRI HEAD WITHOUT CONTRAST  MRA HEAD WITHOUT CONTRAST  TECHNIQUE: Multiplanar, multiecho pulse sequences of the brain and surrounding structures were obtained without intravenous contrast. Angiographic images of the head were obtained using MRA technique without contrast.  COMPARISON:  CT of the head Mar 18, 2015 at 22:04 hours  FINDINGS: MRI HEAD FINDINGS  The ventricles and sulci are normal  for patient's age. No abnormal parenchymal signal, mass lesions, mass effect. Remote LEFT corona radiata cystic lacunar infarct. Patchy to confluent supratentorial, to lesser extent pontine white matter FLAIR T2 hyperintensities. No reduced diffusion to suggest acute ischemia. Scattered tiny supra and infratentorial foci of susceptibility artifact.  No abnormal extra-axial fluid collections. No extra-axial masses though, contrast enhanced sequences would be more sensitive. Normal major intracranial vascular flow voids seen at the skull base.  Ocular globes and orbital contents are nonsuspicious though not tailored for evaluation. Bilateral ocular lens implants. No abnormal sellar expansion. LEFT posterior ethmoid mucosal thickening, small RIGHT maxillary mucosal retention cyst. Small LEFT mastoid effusion. No suspicious calvarial bone marrow signal. Generalized bright T1 bone marrow signal most consistent with osteopenia. No abnormal sellar expansion. Craniocervical junction maintained. Subcentimeter LEFT nasopharyngeal mucosal retention cyst.  MRA HEAD FINDINGS  Anterior circulation: Normal flow related enhancement of the included cervical, petrous, cavernous and supra clinoid internal carotid arteries. Patent anterior communicating artery. Normal flow related enhancement of the anterior and middle cerebral  arteries, including more distal segments. Mild luminal regularity of the proximal anterior and middle cerebral arteries.  No large vessel occlusion, high-grade stenosis, aneurysm.  Posterior circulation: RIGHT vertebral artery is dominant. The LEFT vertebral artery predominantly terminates in the posterior inferior cerebellar artery. Basilar artery is patent, with normal flow related enhancement of the main branch vessels. Robust bilateral posterior communicating arteries present. Normal flow related enhancement of the posterior cerebral arteries.  No large vessel occlusion, high-grade stenosis, aneurysm. Mild luminal regularity of the proximal posterior cerebral arteries.  IMPRESSION: MRI HEAD: No acute intracranial process, specifically no acute ischemia.  Involutional changes. Remote LEFT corona radiata lacunar infarct. Moderate to severe white matter changes suggest chronic small vessel ischemic disease.  Scattered tiny foci of susceptibility artifact can be seen with chronic hypertension though are nonspecific.  MRA HEAD: No acute large vessel occlusion or high-grade stenosis. Complete circle of Willis.  Mild luminal regularity of the proximal cerebral arteries most consistent with atherosclerosis.   Electronically Signed   By: Elon Alas   On: 03/19/2015 05:55   Mr Jodene Nam Head/brain Wo Cm  03/19/2015   CLINICAL DATA:  RIGHT lower extremity weakness, acute onset this morning. Acute onset intermittent speech difficulties.  EXAM: MRI HEAD WITHOUT CONTRAST  MRA HEAD WITHOUT CONTRAST  TECHNIQUE: Multiplanar, multiecho pulse sequences of the brain and surrounding structures were obtained without intravenous contrast. Angiographic images of the head were obtained using MRA technique without contrast.  COMPARISON:  CT of the head Mar 18, 2015 at 22:04 hours  FINDINGS: MRI HEAD FINDINGS  The ventricles and sulci are normal for patient's age. No abnormal parenchymal signal, mass lesions, mass effect. Remote LEFT  corona radiata cystic lacunar infarct. Patchy to confluent supratentorial, to lesser extent pontine white matter FLAIR T2 hyperintensities. No reduced diffusion to suggest acute ischemia. Scattered tiny supra and infratentorial foci of susceptibility artifact.  No abnormal extra-axial fluid collections. No extra-axial masses though, contrast enhanced sequences would be more sensitive. Normal major intracranial vascular flow voids seen at the skull base.  Ocular globes and orbital contents are nonsuspicious though not tailored for evaluation. Bilateral ocular lens implants. No abnormal sellar expansion. LEFT posterior ethmoid mucosal thickening, small RIGHT maxillary mucosal retention cyst. Small LEFT mastoid effusion. No suspicious calvarial bone marrow signal. Generalized bright T1 bone marrow signal most consistent with osteopenia. No abnormal sellar expansion. Craniocervical junction maintained. Subcentimeter LEFT nasopharyngeal mucosal retention cyst.  MRA HEAD FINDINGS  Anterior circulation:  Normal flow related enhancement of the included cervical, petrous, cavernous and supra clinoid internal carotid arteries. Patent anterior communicating artery. Normal flow related enhancement of the anterior and middle cerebral arteries, including more distal segments. Mild luminal regularity of the proximal anterior and middle cerebral arteries.  No large vessel occlusion, high-grade stenosis, aneurysm.  Posterior circulation: RIGHT vertebral artery is dominant. The LEFT vertebral artery predominantly terminates in the posterior inferior cerebellar artery. Basilar artery is patent, with normal flow related enhancement of the main branch vessels. Robust bilateral posterior communicating arteries present. Normal flow related enhancement of the posterior cerebral arteries.  No large vessel occlusion, high-grade stenosis, aneurysm. Mild luminal regularity of the proximal posterior cerebral arteries.  IMPRESSION: MRI HEAD: No  acute intracranial process, specifically no acute ischemia.  Involutional changes. Remote LEFT corona radiata lacunar infarct. Moderate to severe white matter changes suggest chronic small vessel ischemic disease.  Scattered tiny foci of susceptibility artifact can be seen with chronic hypertension though are nonspecific.  MRA HEAD: No acute large vessel occlusion or high-grade stenosis. Complete circle of Willis.  Mild luminal regularity of the proximal cerebral arteries most consistent with atherosclerosis.   Electronically Signed   By: Elon Alas   On: 03/19/2015 05:55     CBC  Recent Labs Lab 03/18/15 2124 03/18/15 2133 03/19/15 0340 03/20/15 0537  WBC 14.6*  --  9.5 9.1  HGB 11.1* 12.2* 10.3* 10.5*  HCT 33.7* 36.0* 31.8* 32.2*  PLT 204  --  204 210  MCV 93.4  --  92.2 92.3  MCH 30.7  --  29.9 30.1  MCHC 32.9  --  32.4 32.6  RDW 14.7  --  14.9 14.9  LYMPHSABS 1.1  --   --   --   MONOABS 0.9  --   --   --   EOSABS 0.5  --   --   --   BASOSABS 0.0  --   --   --     Chemistries   Recent Labs Lab 03/18/15 2124 03/18/15 2133 03/19/15 0340 03/20/15 0537  NA 137 142  --  141  K 2.9* 3.0*  --  3.3*  CL 112* 111  --  114*  CO2 16*  --   --  17*  GLUCOSE 71 66  --  104*  BUN 63* 58*  --  55*  CREATININE 2.42* 2.70* 2.53* 2.17*  CALCIUM 9.0  --   --  9.8  AST 24  --   --   --   ALT 32  --   --   --   ALKPHOS 115  --   --   --   BILITOT 0.5  --   --   --    ------------------------------------------------------------------------------------------------------------------ estimated creatinine clearance is 25.9 mL/min (by C-G formula based on Cr of 2.17). ------------------------------------------------------------------------------------------------------------------  Recent Labs  03/19/15 0340  HGBA1C 5.3   ------------------------------------------------------------------------------------------------------------------  Recent Labs  03/19/15 0340  CHOL 113   HDL 24*  LDLCALC 40  TRIG 245*  CHOLHDL 4.7   ------------------------------------------------------------------------------------------------------------------ No results for input(s): TSH, T4TOTAL, T3FREE, THYROIDAB in the last 72 hours.  Invalid input(s): FREET3 ------------------------------------------------------------------------------------------------------------------ No results for input(s): VITAMINB12, FOLATE, FERRITIN, TIBC, IRON, RETICCTPCT in the last 72 hours.  Coagulation profile  Recent Labs Lab 03/18/15 2124  INR 1.12    No results for input(s): DDIMER in the last 72 hours.  Cardiac Enzymes No results for input(s): CKMB, TROPONINI, MYOGLOBIN in the last 168 hours.  Invalid input(s):  CK ------------------------------------------------------------------------------------------------------------------ Invalid input(s): POCBNP     Time Spent in minutes   57 minutes   Rickia Freeburg M.D on 03/20/2015 at 11:07 AM  Between 7am to 7pm - Pager - [phone]  After 7pm go to [email or web address] - password [facility]  [facility]   Office  [phone]

## 2015-03-20 NOTE — Progress Notes (Signed)
Pt did not have anymore episodes of red secretions throughout the night.   Fredrich Romans, RN

## 2015-03-20 NOTE — Progress Notes (Signed)
STROKE TEAM PROGRESS NOTE   HISTORY [name] is an 74 y.o. male history of hypertension, hyperlipidemia, diabetes mellitus, COPD, coronary artery disease and peripheral vascular disease, presenting with recurrent spells of speech output difficulty and dysarthria, as well as lower extremity weakness, primarily right lower extremity involvement. He's been taking aspirin daily for antiplatelet therapy. CT of his head showed no acute intracranial abnormality. MRI showed no acute findings, as well. A remote left corona radiata, infarction was noted. MRA showed no large vessel occlusion or high-grade stenosis. Deficits resolved with each occurrence of weakness and speech difficulty. He was LSN: 7:30 PM on 03/18/2015. Patient was not administered TPA secondary to resolved deficits. He was admitted for further evaluation and treatment.   SUBJECTIVE (INTERVAL HISTORY) Plans for stent next week. No acute issue overnight.   OBJECTIVE Temp:  [98.3 F (36.8 C)-99.1 F (37.3 C)] 98.3 F (36.8 C) (05/18 0934) Pulse Rate:  [67-73] 71 (05/18 0934) Cardiac Rhythm:  [-] Normal sinus rhythm (05/18 0800) Resp:  [16-20] 20 (05/18 0934) BP: (144-171)/(56-78) 171/78 mmHg (05/18 0934) SpO2:  [98 %-100 %] 100 % (05/18 0934)   Recent Labs Lab 03/19/15 1217 03/19/15 1654 03/19/15 2126 03/20/15 0624 03/20/15 1113  GLUCAP 104* 158* 126* 118* 117*    Recent Labs Lab 03/18/15 2124 03/18/15 2133 03/19/15 0340 03/20/15 0537  NA 137 142  --  141  K 2.9* 3.0*  --  3.3*  CL 112* 111  --  114*  CO2 16*  --   --  17*  GLUCOSE 71 66  --  104*  BUN 63* 58*  --  55*  CREATININE 2.42* 2.70* 2.53* 2.17*  CALCIUM 9.0  --   --  9.8    Recent Labs Lab 03/18/15 2124  AST 24  ALT 32  ALKPHOS 115  BILITOT 0.5  PROT 8.0  ALBUMIN 4.2    Recent Labs Lab 03/18/15 2124 03/18/15 2133 03/19/15 0340 03/20/15 0537  WBC 14.6*  --  9.5 9.1  NEUTROABS 12.0*  --   --   --   HGB 11.1* 12.2* 10.3* 10.5*   HCT 33.7* 36.0* 31.8* 32.2*  MCV 93.4  --  92.2 92.3  PLT 204  --  204 210   No results for input(s): CKTOTAL, CKMB, CKMBINDEX, TROPONINI in the last 168 hours.  Recent Labs  03/18/15 2124  LABPROT 14.6  INR 1.12    Recent Labs  03/18/15 2212  COLORURINE YELLOW  LABSPEC 1.012  PHURINE 5.5  GLUCOSEU NEGATIVE  HGBUR NEGATIVE  BILIRUBINUR NEGATIVE  KETONESUR NEGATIVE  PROTEINUR NEGATIVE  UROBILINOGEN 0.2  NITRITE NEGATIVE  LEUKOCYTESUR NEGATIVE       Component Value Date/Time   CHOL 113 03/19/2015 0340   TRIG 245* 03/19/2015 0340   TRIG 135 10/13/2006 0754   HDL 24* 03/19/2015 0340   CHOLHDL 4.7 03/19/2015 0340   CHOLHDL 4.6 CALC 10/13/2006 0754   VLDL 49* 03/19/2015 0340   LDLCALC 40 03/19/2015 0340   Lab Results  Component Value Date   HGBA1C 5.3 03/19/2015      Component Value Date/Time   LABOPIA NONE DETECTED 03/19/2015 0329   COCAINSCRNUR NONE DETECTED 03/19/2015 0329   LABBENZ POSITIVE* 03/19/2015 0329   AMPHETMU NONE DETECTED 03/19/2015 0329   THCU NONE DETECTED 03/19/2015 0329   LABBARB NONE DETECTED 03/19/2015 0329     Recent Labs Lab 03/18/15 2124  ETH <5   I have personally reviewed the radiological images below and agree with the radiology  interpretations.  Ct Head Wo Contrast 03/18/2015   Atrophy with small vessel chronic ischemic changes of deep cerebral white matter.  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  No acute intracranial abnormalities.     MRI HEAD 03/19/2015   No acute intracranial process, specifically no acute ischemia.  Involutional changes. Remote LEFT corona radiata lacunar infarct. Moderate to severe white matter changes suggest chronic small vessel ischemic disease.  Scattered tiny foci of susceptibility artifact can be seen with chronic hypertension though are nonspecific.    MRA HEAD 03/19/2015    No acute large vessel occlusion or high-grade stenosis. Complete circle of Willis.  Mild luminal  regularity of the proximal cerebral arteries most consistent with atherosclerosis.     CUS - Right - 1% to 39% upper end of scale ICA stenosis. ECA stenosis. Vertebral artery flow is antegrade. Left - Greater than 80% ICA stenosis at the bulb/ proximal ICA and decrease to 1% to 39% upper end of scale In the mid ICA. ECA stenosis. Vertebral artery flow is antegrade.  2D echo - LVEF 55-60%, moderate LVH, normal wall motion, diastolicdysfunction, normal LV filling pressure, mild TR, normal RVSP.  EEG - normal EEG   PHYSICAL EXAM Temp:  [98.3 F (36.8 C)-99.1 F (37.3 C)] 98.3 F (36.8 C) (05/18 0934) Pulse Rate:  [67-73] 71 (05/18 0934) Resp:  [16-20] 20 (05/18 0934) BP: (144-171)/(56-78) 171/78 mmHg (05/18 0934) SpO2:  [98 %-100 %] 100 % (05/18 0934)  General - Well nourished, well developed, in no apparent distress.  Ophthalmologic - Fundi not visualized due to eye movement.  Cardiovascular - Regular rate and rhythm with no murmur.  Mental Status -  Level of arousal and orientation to time, place, and person were intact. Language including expression, naming, repetition, comprehension was assessed and found intact. Fund of Knowledge was assessed and was intact.  Cranial Nerves II - XII - II - Visual field intact OU. III, IV, VI - Extraocular movements intact. V - Facial sensation intact bilaterally. VII - Facial movement intact bilaterally. VIII - Hearing & vestibular intact bilaterally. X - Palate elevates symmetrically. XI - Chin turning & shoulder shrug intact bilaterally. XII - Tongue protrusion intact.  Motor Strength - The patient's strength was normal in all extremities and pronator drift was absent.  Bulk was normal and fasciculations were absent.   Motor Tone - Muscle tone was assessed at the neck and appendages and was normal.  Reflexes - The patient's reflexes were 1+ in all extremities and he had no pathological reflexes.  Sensory - Light touch,  temperature/pinprick, vibration and proprioception, and Romberg testing were assessed and were symmetrical.    Coordination - The patient had normal movements in the hands and feet with no ataxia or dysmetria.  Tremor was absent.  Gait and Station - deferred due to fatigue.   ASSESSMENT/PLAN Mr. [name] is a 74 y.o. male with history of  hypertension, hyperlipidemia, diabetes mellitus, COPD, coronary artery disease and peripheral vascular disease, presenting with recurrent spells of speech output difficulty, dysarthria, right lower extremity weakness.  He did not receive IV t-PA due to resolved deficits.   TIA - left hemisphere, likely due to left ICA high grade stenosis   Resultant  Neuro deficits resolved  MRI  No acute stroke  MRA  No large vessel stenosis  Carotid Doppler  Left ICA >80% stenosis at bulb  2D Echo  unremarkable  EEG normal  LDL 40  HgbA1c 5.5  Heparin  5000 units sq tid for VTE prophylaxis Diet heart healthy/carb modified Room service appropriate?: Yes; Fluid consistency:: Thin Diet - low sodium heart healthy  aspirin 81 mg orally every day prior to admission, with plans for stent, recommend decreasing aspirin to aspirin 81 mg orally every day and clopidogrel 75 mg orally every day.   vascular surgeon plans stent next week  Patient counseled to be compliant with his antithrombotic medications  Ongoing aggressive stroke risk factor management  Carotid stenosis  Left ICA stenosis > 80% at bulb  Avoid hypotension  BP goal 130-150 before procedure  Hypertension  Home meds:   Clonidine, labetalol   Currently on labetalol  Avoid hypotension  BP goal 130-150 before procedure   Hyperlipidemia  Home meds:  lipitor 10,  resumed in hospital  LDL 40, goal < 70  Continue statin at discharge  DM  A1C 5.5  Under control  Other Stroke Risk Factors  Advanced age  Cigarette smoker, quit smoking 10 months ago , re-advised to stop  smoking  Coronary artery disease - MI, PCI s/ stent 1998  PVD  Other Active Problems  Anxiety  COPD w/ emphysema  GERD  Barrett's esophagus  Depression  AAA repair 2010   CKD stage 3  hypokalemia  Other Pertinent History  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 I obtained additional history from pt's wife at bedside. I also discussed with pt and Dr. Broadus John regarding his care plan. Together with the NP/PA, we formulated the assessment and plan of care which reflects our mutual decision.  I have made any additions or clarifications directly to the above note and agree with the findings and plan as currently documented.   73 yo M with hx of HTN, HLD, COPD, PVD, AAA s/p repair, CAD and MI, smoker, left parotid Ca s/p radiation and surgery was admitted for transient right LE weakness. MRI showed old CR stroke. CUS showed left ICA >80% stenosis. Not able to do CTA due to high Cre. Vascular surgery consulted and consider CAS next week. Continue dural antiplatelet and avoid hypotension before procedure.   Rosalin Hawking, MD PhD Stroke Neurology 03/20/2015 6:15 PM   To contact Stroke Continuity provider, please refer to [email or web address] After hours, contact General Neurology

## 2015-03-20 NOTE — Discharge Summary (Signed)
Physician Discharge Summary  [name] IRS:[id number] DOB: [birth year] DOA: 03/18/2015  PCP: Walker Kehr, MD  Admit date: 03/18/2015 Discharge date: 03/20/2015  Time spent: 45 minutes  Recommendations for Outpatient Follow-up:  1. Dr.Brabham for L carotid artery stent on Tuesday 2. Dr.Xu, Neurology in 1 month 3. 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 HPI: [name] is an 74 y.o. male history of hypertension, hyperlipidemia, diabetes mellitus, COPD, CAD and PAD, presented with recurrent spells of speech output difficulty and dysarthria, as well as lower extremity weakness, primarily right lower extremity involvement. He had been taking aspirin daily for antiplatelet therapy.  Hospital Course:  L brain TIA - Symptoms has resolved, no acute finding on MRI brain, MRA showing no large vessel stenosis. 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 Amedeo Plenty  Hypertension - BP on higher side, continue clonidine and labetalol  CKD - Baseline creatinine 2.2, today is 2.17, continue to monitor, avoid nephrotoxic medication. -suspect metabolic acidosis is from CKD, advised to continue bicarbonate  Hyperlipidemia -Continue with statin  Diabetes mellitus - Hemoglobin A1c is 5.5 in March 2016 -CBGs 104-158, continued on glimepiride, could be stopped, will defer to PCP  Tobacco abuse -counseled  COPD with emphysema - stable   Consultations:  Neurology Dr.XU  [facility] Dr.Brabham  Discharge Exam: Filed Vitals:   03/20/15 0934  BP: 171/78  Pulse: 71  Temp: 98.3 F (36.8 C)  Resp: 20    General: AAOX3 Cardiovascular: S1S2/RRR Respiratory: CTAB  Discharge Instructions   Discharge Instructions    Diet - low sodium heart healthy    Complete by:  As directed      Increase activity slowly    Complete by:  As directed           Discharge Medication List as of 03/20/2015 11:54 AM    START taking these medications   Details  clopidogrel (PLAVIX) 75 MG tablet Take 1 tablet (75 mg total) by mouth daily., Starting 03/20/2015, Until Discontinued, Print      CONTINUE these medications which have NOT CHANGED   Details  ALPRAZolam (XANAX) 0.5 MG tablet TAKE 1 TABLET TWICE A DAY AS NEEDED, Phone In    aspirin EC 81 MG tablet Take 81 mg by mouth  every morning. , Until Discontinued, Historical Med    atorvastatin (LIPITOR) 10 MG tablet Take 10 mg by mouth daily. , Until Discontinued, Historical Med    calcium-vitamin D (OSCAL WITH D) 500-200 MG-UNIT per tablet Take 1 tablet by mouth 2 (two) times daily. , Until Discontinued, Historical Med    cholecalciferol (VITAMIN D) 1000 UNITS tablet Take 2,000 Units by mouth 2 (two) times daily. , Until Discontinued, Historical Med    cloNIDine (CATAPRES) 0.1  MG tablet Take 1 tablet (0.1 mg total) by mouth 2 (two) times daily., Starting 01/17/2015, Until Discontinued, Normal    FLUoxetine (PROZAC) 10 MG capsule Take 1 capsule (10 mg total) by mouth daily., Starting 08/30/2014, Until Discontinued, Normal    gabapentin (NEURONTIN) 100 MG capsule Take 1 capsule (100 mg total) by mouth 2 (two) times daily., Starting 05/30/2014, Until Discontinued, Normal    glimepiride (AMARYL) 1 MG tablet Take 1 tablet (1 mg total) by mouth 2 (two) times daily., Starting 06/22/2014, Until Discontinued, Normal    iron polysaccharides (NIFEREX) 150 MG capsule Take 1 capsule (150 mg total) by mouth daily., Starting 05/10/2014, Until Discontinued, Normal    labetalol (NORMODYNE) 200 MG tablet Take 0.5 tablets (100 mg total) by mouth 3 (three) times daily., Starting 01/10/2015, Until Discontinued, No Print    Magnesium 300 MG CAPS Take 300 mg by mouth daily., Until Discontinued, Historical Med    mirtazapine (REMERON) 15 MG tablet Take 1-2 tablets (15-30 mg total) by mouth at bedtime as needed (for sleep)., Starting 01/22/2015, Until Discontinued, Normal    Multiple Vitamin (MULTIVITAMIN WITH MINERALS) TABS tablet Take 1 tablet by mouth every morning., Until Discontinued, Historical Med    omeprazole (PRILOSEC) 40 MG capsule Take 1 capsule (40 mg total) by mouth 2 (two) times daily., Starting 05/23/2014, Until Discontinued, Normal    sodium bicarbonate 650 MG tablet Take 1,300 mg by mouth every morning. , Until Discontinued, Historical Med    temazepam (RESTORIL) 30 MG capsule Take 30 mg by mouth at bedtime. , Starting 02/04/2015, Until Discontinued, Historical Med    Umeclidinium-Vilanterol (ANORO ELLIPTA) 62.5-25 MCG/INH AEPB Inhale 1 puff into the lungs daily., Until Discontinued, Historical Med    glucose blood (ONE TOUCH TEST STRIPS) test strip Use as instructed, Normal    ONETOUCH DELICA LANCETS FINE MISC Starting 07/04/2014, Until Discontinued, Historical Med        Allergies  Allergen Reactions  . Amlodipine Besylate Swelling  . Benazepril Hcl Other (See Comments)    Elevates potassium levels  . Risperidone Other (See Comments)    loss of motor skills  . Tramadol Nausea And Vomiting  . Valsartan Other (See Comments)    Elevates potassium levels  . Penicillins Itching and Rash   Follow-up Information    Follow up with Walker Kehr, MD. Schedule an appointment as soon as possible for a visit in 1 week.   Specialty:  Internal Medicine   Contact information:   [address] [address] [phone]       Follow up with Annamarie Major, MD.   Specialties:  Vascular Surgery, Cardiology   Why:  office will call for Surgery Tuesday   Contact information:   [place]  [phone]        The results of significant diagnostics from this hospitalization (including imaging, microbiology, ancillary and laboratory) are listed below for reference.    Significant Diagnostic Studies: Ct Head Wo Contrast  03/18/2015   CLINICAL DATA:  RIGHT leg weakness twice today, with difficulty speaking both times, speech now clear, past history hypertension, diabetes, LEFT parotid cancer  EXAM: CT HEAD WITHOUT CONTRAST  TECHNIQUE: Contiguous axial images were obtained from the base of the skull through the vertex without intravenous contrast.  COMPARISON:  03/22/2013  FINDINGS: Generalized atrophy.  Normal ventricular morphology.  No midline shift or mass effect.  Small vessel chronic ischemic changes of deep cerebral white matter.  Periventricular white matter infarct LEFT parietal.  No definite intracranial hemorrhage, mass lesion, or evidence acute infarction.  No extra-axial fluid collections.  Atherosclerotic calcification at carotid siphons.  Bones demineralized.  Minimal fluid within a LEFT ethmoid air cell.  Remaining sinuses and mastoid air cells clear.  IMPRESSION: Atrophy with small vessel chronic ischemic changes of deep  cerebral white matter.  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  No acute intracranial abnormalities.   Electronically Signed   By: Lavonia Dana M.D.   On: 03/18/2015 22:25   Mri Brain Without Contrast  03/19/2015   CLINICAL DATA:  RIGHT lower extremity weakness, acute onset this morning. Acute onset intermittent speech difficulties.  EXAM: MRI HEAD WITHOUT CONTRAST  MRA HEAD WITHOUT CONTRAST  TECHNIQUE: Multiplanar, multiecho pulse sequences of the brain and surrounding structures were obtained without intravenous contrast. Angiographic images of the head were obtained using MRA technique without contrast.  COMPARISON:  CT of the head Mar 18, 2015 at 22:04 hours  FINDINGS: MRI HEAD FINDINGS  The ventricles and sulci are normal for patient's age. No abnormal parenchymal signal, mass lesions, mass effect. Remote LEFT corona radiata cystic lacunar infarct. Patchy to confluent supratentorial, to lesser extent pontine white matter FLAIR T2 hyperintensities. No reduced diffusion to suggest acute ischemia. Scattered tiny supra and infratentorial foci of susceptibility artifact.  No abnormal extra-axial fluid collections. No extra-axial masses though, contrast enhanced sequences would be more sensitive. Normal major intracranial vascular flow voids seen at the skull base.  Ocular globes and orbital contents are nonsuspicious though not tailored for evaluation. Bilateral ocular lens implants. No abnormal sellar expansion. LEFT posterior ethmoid mucosal thickening, small RIGHT maxillary mucosal retention cyst. Small LEFT mastoid effusion. No suspicious calvarial bone marrow signal. Generalized bright T1 bone marrow signal most consistent with osteopenia. No abnormal sellar expansion. Craniocervical junction maintained. Subcentimeter LEFT nasopharyngeal mucosal retention cyst.  MRA HEAD FINDINGS  Anterior circulation: Normal flow related enhancement of the included cervical, petrous, cavernous and  supra clinoid internal carotid arteries. Patent anterior communicating artery. Normal flow related enhancement of the anterior and middle cerebral arteries, including more distal segments. Mild luminal regularity of the proximal anterior and middle cerebral arteries.  No large vessel occlusion, high-grade stenosis, aneurysm.  Posterior circulation: RIGHT vertebral artery is dominant. The LEFT vertebral artery predominantly terminates in the posterior inferior cerebellar artery. Basilar artery is patent, with normal flow related enhancement of the main branch vessels. Robust bilateral posterior communicating arteries present. Normal flow related enhancement of the posterior cerebral arteries.  No large vessel occlusion, high-grade stenosis, aneurysm. Mild luminal regularity of the proximal posterior cerebral arteries.  IMPRESSION: MRI HEAD: No acute intracranial process, specifically no acute ischemia.  Involutional changes. Remote LEFT corona radiata lacunar infarct. Moderate to severe white matter changes suggest chronic small vessel ischemic disease.  Scattered tiny foci of susceptibility artifact can be seen with chronic hypertension though are nonspecific.  MRA HEAD: No acute large vessel occlusion or high-grade stenosis. Complete circle of Willis.  Mild luminal regularity of the proximal cerebral  arteries most consistent with atherosclerosis.   Electronically Signed   By: Elon Alas   On: 03/19/2015 05:55   Mr Jodene Nam Head/brain Wo Cm  03/19/2015   CLINICAL DATA:  RIGHT lower extremity weakness, acute onset this morning. Acute onset intermittent speech difficulties.  EXAM: MRI HEAD WITHOUT CONTRAST  MRA HEAD WITHOUT CONTRAST  TECHNIQUE: Multiplanar, multiecho pulse sequences of the brain and surrounding structures were obtained without intravenous contrast. Angiographic images of the head were obtained using MRA technique without contrast.  COMPARISON:  CT of the head Mar 18, 2015 at 22:04 hours   FINDINGS: MRI HEAD FINDINGS  The ventricles and sulci are normal for patient's age. No abnormal parenchymal signal, mass lesions, mass effect. Remote LEFT corona radiata cystic lacunar infarct. Patchy to confluent supratentorial, to lesser extent pontine white matter FLAIR T2 hyperintensities. No reduced diffusion to suggest acute ischemia. Scattered tiny supra and infratentorial foci of susceptibility artifact.  No abnormal extra-axial fluid collections. No extra-axial masses though, contrast enhanced sequences would be more sensitive. Normal major intracranial vascular flow voids seen at the skull base.  Ocular globes and orbital contents are nonsuspicious though not tailored for evaluation. Bilateral ocular lens implants. No abnormal sellar expansion. LEFT posterior ethmoid mucosal thickening, small RIGHT maxillary mucosal retention cyst. Small LEFT mastoid effusion. No suspicious calvarial bone marrow signal. Generalized bright T1 bone marrow signal most consistent with osteopenia. No abnormal sellar expansion. Craniocervical junction maintained. Subcentimeter LEFT nasopharyngeal mucosal retention cyst.  MRA HEAD FINDINGS  Anterior circulation: Normal flow related enhancement of the included cervical, petrous, cavernous and supra clinoid internal carotid arteries. Patent anterior communicating artery. Normal flow related enhancement of the anterior and middle cerebral arteries, including more distal segments. Mild luminal regularity of the proximal anterior and middle cerebral arteries.  No large vessel occlusion, high-grade stenosis, aneurysm.  Posterior circulation: RIGHT vertebral artery is dominant. The LEFT vertebral artery predominantly terminates in the posterior inferior cerebellar artery. Basilar artery is patent, with normal flow related enhancement of the main branch vessels. Robust bilateral posterior communicating arteries present. Normal flow related enhancement of the posterior cerebral arteries.   No large vessel occlusion, high-grade stenosis, aneurysm. Mild luminal regularity of the proximal posterior cerebral arteries.  IMPRESSION: MRI HEAD: No acute intracranial process, specifically no acute ischemia.  Involutional changes. Remote LEFT corona radiata lacunar infarct. Moderate to severe white matter changes suggest chronic small vessel ischemic disease.  Scattered tiny foci of susceptibility artifact can be seen with chronic hypertension though are nonspecific.  MRA HEAD: No acute large vessel occlusion or high-grade stenosis. Complete circle of Willis.  Mild luminal regularity of the proximal cerebral arteries most consistent with atherosclerosis.   Electronically Signed   By: Elon Alas   On: 03/19/2015 05:55    Microbiology: No results found for this or any previous visit (from the past 240 hour(s)).   Labs: Basic Metabolic Panel:  Recent Labs Lab 03/18/15 2124 03/18/15 2133 03/19/15 0340 03/20/15 0537  NA 137 142  --  141  K 2.9* 3.0*  --  3.3*  CL 112* 111  --  114*  CO2 16*  --   --  17*  GLUCOSE 71 66  --  104*  BUN 63* 58*  --  55*  CREATININE 2.42* 2.70* 2.53* 2.17*  CALCIUM 9.0  --   --  9.8   Liver Function Tests:  Recent Labs Lab 03/18/15 2124  AST 24  ALT 32  ALKPHOS 115  BILITOT 0.5  PROT  8.0  ALBUMIN 4.2   No results for input(s): LIPASE, AMYLASE in the last 168 hours. No results for input(s): AMMONIA in the last 168 hours. CBC:  Recent Labs Lab 03/18/15 2124 03/18/15 2133 03/19/15 0340 03/20/15 0537  WBC 14.6*  --  9.5 9.1  NEUTROABS 12.0*  --   --   --   HGB 11.1* 12.2* 10.3* 10.5*  HCT 33.7* 36.0* 31.8* 32.2*  MCV 93.4  --  92.2 92.3  PLT 204  --  204 210   Cardiac Enzymes: No results for input(s): CKTOTAL, CKMB, CKMBINDEX, TROPONINI in the last 168 hours. BNP: BNP (last 3 results) No results for input(s): BNP in the last 8760 hours.  ProBNP (last 3 results) No results for input(s): PROBNP in the last 8760  hours.  CBG:  Recent Labs Lab 03/19/15 1217 03/19/15 1654 03/19/15 2126 03/20/15 0624 03/20/15 1113  GLUCAP 104* 158* 126* 118* 117*       Signed:  Janell Keeling  [facility] 03/20/2015, 5:58 PM

## 2015-03-21 ENCOUNTER — Other Ambulatory Visit: Payer: Self-pay

## 2015-03-21 ENCOUNTER — Telehealth: Payer: Self-pay | Admitting: *Deleted

## 2015-03-21 NOTE — Telephone Encounter (Signed)
Called pt again completed TCM call below.../lmb  Transition Care Management Follow-up Telephone Call D/C 03/20/15  How have you been since you were released from the hospital? Pt states he is ok   Do you understand why you were in the hospital? YES   Do you understand the discharge instrcutions? YES  Items Reviewed:  Medications reviewed: YES  Allergies reviewed: YES  Dietary changes reviewed: YES, heart healthy  Referrals reviewed: No referral needed   Functional Questionnaire:   Activities of Daily Living (ADLs):   He states he are independent in the following: ambulation, bathing and hygiene, feeding, continence, grooming, toileting and dressing States he require assistance with the following: ambulation   Any transportation issues/concerns?: NO   Any patient concerns? NO   Confirmed importance and date/time of follow-up visits scheduled: YES, made appt for 04/04/15 w/dr. plotnikov   Confirmed with patient if condition begins to worsen call PCP or go to the ER.

## 2015-03-21 NOTE — Telephone Encounter (Signed)
Patient returned your call.

## 2015-03-21 NOTE — Telephone Encounter (Signed)
Called pt concerning TCM appt no answer LMOM RTC.../lmb 

## 2015-03-25 ENCOUNTER — Other Ambulatory Visit: Payer: Self-pay | Admitting: *Deleted

## 2015-03-25 NOTE — Telephone Encounter (Signed)
Rf req for Atorvastatin 40 mg 1 po qam # 90.   Our list shows taking 10 mg.    40 mg was d/c.   Please advise ok to Rf 40 mg?

## 2015-03-26 ENCOUNTER — Inpatient Hospital Stay (HOSPITAL_COMMUNITY): Payer: Medicare Other

## 2015-03-26 ENCOUNTER — Encounter (HOSPITAL_COMMUNITY): Admission: RE | Disposition: E | Payer: Self-pay | Source: Ambulatory Visit | Attending: Neurology

## 2015-03-26 ENCOUNTER — Encounter (HOSPITAL_COMMUNITY): Payer: Self-pay | Admitting: Surgery

## 2015-03-26 ENCOUNTER — Inpatient Hospital Stay (HOSPITAL_COMMUNITY)
Admission: RE | Admit: 2015-03-26 | Discharge: 2015-04-03 | DRG: 025 | Disposition: E | Payer: Medicare Other | Source: Ambulatory Visit | Attending: Neurology | Admitting: Neurology

## 2015-03-26 ENCOUNTER — Ambulatory Visit (HOSPITAL_COMMUNITY): Payer: Medicare Other

## 2015-03-26 ENCOUNTER — Encounter (HOSPITAL_COMMUNITY): Admission: RE | Disposition: E | Payer: Non-veteran care | Source: Ambulatory Visit | Attending: Neurology

## 2015-03-26 ENCOUNTER — Ambulatory Visit (HOSPITAL_COMMUNITY): Payer: Medicare Other | Admitting: Certified Registered Nurse Anesthetist

## 2015-03-26 DIAGNOSIS — Z9282 Status post administration of tPA (rtPA) in a different facility within the last 24 hours prior to admission to current facility: Secondary | ICD-10-CM | POA: Diagnosis not present

## 2015-03-26 DIAGNOSIS — J9690 Respiratory failure, unspecified, unspecified whether with hypoxia or hypercapnia: Secondary | ICD-10-CM | POA: Insufficient documentation

## 2015-03-26 DIAGNOSIS — I63311 Cerebral infarction due to thrombosis of right middle cerebral artery: Secondary | ICD-10-CM | POA: Diagnosis not present

## 2015-03-26 DIAGNOSIS — F419 Anxiety disorder, unspecified: Secondary | ICD-10-CM | POA: Diagnosis present

## 2015-03-26 DIAGNOSIS — E1151 Type 2 diabetes mellitus with diabetic peripheral angiopathy without gangrene: Secondary | ICD-10-CM | POA: Diagnosis present

## 2015-03-26 DIAGNOSIS — I129 Hypertensive chronic kidney disease with stage 1 through stage 4 chronic kidney disease, or unspecified chronic kidney disease: Secondary | ICD-10-CM | POA: Diagnosis not present

## 2015-03-26 DIAGNOSIS — K219 Gastro-esophageal reflux disease without esophagitis: Secondary | ICD-10-CM | POA: Diagnosis present

## 2015-03-26 DIAGNOSIS — I6523 Occlusion and stenosis of bilateral carotid arteries: Secondary | ICD-10-CM | POA: Diagnosis not present

## 2015-03-26 DIAGNOSIS — G936 Cerebral edema: Secondary | ICD-10-CM | POA: Diagnosis not present

## 2015-03-26 DIAGNOSIS — I97811 Intraoperative cerebrovascular infarction during other surgery: Secondary | ICD-10-CM | POA: Diagnosis not present

## 2015-03-26 DIAGNOSIS — Z515 Encounter for palliative care: Secondary | ICD-10-CM

## 2015-03-26 DIAGNOSIS — Y848 Other medical procedures as the cause of abnormal reaction of the patient, or of later complication, without mention of misadventure at the time of the procedure: Secondary | ICD-10-CM | POA: Diagnosis present

## 2015-03-26 DIAGNOSIS — K22711 Barrett's esophagus with high grade dysplasia: Secondary | ICD-10-CM | POA: Diagnosis not present

## 2015-03-26 DIAGNOSIS — I639 Cerebral infarction, unspecified: Secondary | ICD-10-CM | POA: Diagnosis not present

## 2015-03-26 DIAGNOSIS — Z8589 Personal history of malignant neoplasm of other organs and systems: Secondary | ICD-10-CM | POA: Diagnosis not present

## 2015-03-26 DIAGNOSIS — I609 Nontraumatic subarachnoid hemorrhage, unspecified: Secondary | ICD-10-CM | POA: Insufficient documentation

## 2015-03-26 DIAGNOSIS — N183 Chronic kidney disease, stage 3 (moderate): Secondary | ICD-10-CM | POA: Diagnosis present

## 2015-03-26 DIAGNOSIS — Z6820 Body mass index (BMI) 20.0-20.9, adult: Secondary | ICD-10-CM | POA: Diagnosis not present

## 2015-03-26 DIAGNOSIS — D62 Acute posthemorrhagic anemia: Secondary | ICD-10-CM | POA: Diagnosis not present

## 2015-03-26 DIAGNOSIS — E1122 Type 2 diabetes mellitus with diabetic chronic kidney disease: Secondary | ICD-10-CM | POA: Diagnosis present

## 2015-03-26 DIAGNOSIS — Z955 Presence of coronary angioplasty implant and graft: Secondary | ICD-10-CM | POA: Diagnosis not present

## 2015-03-26 DIAGNOSIS — Z09 Encounter for follow-up examination after completed treatment for conditions other than malignant neoplasm: Secondary | ICD-10-CM | POA: Diagnosis not present

## 2015-03-26 DIAGNOSIS — Z452 Encounter for adjustment and management of vascular access device: Secondary | ICD-10-CM | POA: Diagnosis not present

## 2015-03-26 DIAGNOSIS — Z7902 Long term (current) use of antithrombotics/antiplatelets: Secondary | ICD-10-CM

## 2015-03-26 DIAGNOSIS — N179 Acute kidney failure, unspecified: Secondary | ICD-10-CM | POA: Diagnosis present

## 2015-03-26 DIAGNOSIS — I739 Peripheral vascular disease, unspecified: Secondary | ICD-10-CM | POA: Diagnosis not present

## 2015-03-26 DIAGNOSIS — E785 Hyperlipidemia, unspecified: Secondary | ICD-10-CM | POA: Diagnosis not present

## 2015-03-26 DIAGNOSIS — Z4682 Encounter for fitting and adjustment of non-vascular catheter: Secondary | ICD-10-CM | POA: Diagnosis not present

## 2015-03-26 DIAGNOSIS — Z79899 Other long term (current) drug therapy: Secondary | ICD-10-CM

## 2015-03-26 DIAGNOSIS — R001 Bradycardia, unspecified: Secondary | ICD-10-CM | POA: Diagnosis present

## 2015-03-26 DIAGNOSIS — I252 Old myocardial infarction: Secondary | ICD-10-CM | POA: Diagnosis not present

## 2015-03-26 DIAGNOSIS — R0902 Hypoxemia: Secondary | ICD-10-CM | POA: Diagnosis not present

## 2015-03-26 DIAGNOSIS — I63531 Cerebral infarction due to unspecified occlusion or stenosis of right posterior cerebral artery: Secondary | ICD-10-CM | POA: Diagnosis not present

## 2015-03-26 DIAGNOSIS — E872 Acidosis: Secondary | ICD-10-CM | POA: Diagnosis present

## 2015-03-26 DIAGNOSIS — D638 Anemia in other chronic diseases classified elsewhere: Secondary | ICD-10-CM | POA: Diagnosis not present

## 2015-03-26 DIAGNOSIS — R471 Dysarthria and anarthria: Secondary | ICD-10-CM | POA: Diagnosis present

## 2015-03-26 DIAGNOSIS — E780 Pure hypercholesterolemia: Secondary | ICD-10-CM | POA: Diagnosis present

## 2015-03-26 DIAGNOSIS — E1159 Type 2 diabetes mellitus with other circulatory complications: Secondary | ICD-10-CM | POA: Insufficient documentation

## 2015-03-26 DIAGNOSIS — Z8673 Personal history of transient ischemic attack (TIA), and cerebral infarction without residual deficits: Secondary | ICD-10-CM | POA: Diagnosis not present

## 2015-03-26 DIAGNOSIS — E870 Hyperosmolality and hypernatremia: Secondary | ICD-10-CM | POA: Diagnosis not present

## 2015-03-26 DIAGNOSIS — Z7982 Long term (current) use of aspirin: Secondary | ICD-10-CM | POA: Diagnosis not present

## 2015-03-26 DIAGNOSIS — I615 Nontraumatic intracerebral hemorrhage, intraventricular: Secondary | ICD-10-CM | POA: Diagnosis not present

## 2015-03-26 DIAGNOSIS — I6789 Other cerebrovascular disease: Secondary | ICD-10-CM | POA: Diagnosis not present

## 2015-03-26 DIAGNOSIS — Z66 Do not resuscitate: Secondary | ICD-10-CM | POA: Diagnosis not present

## 2015-03-26 DIAGNOSIS — R918 Other nonspecific abnormal finding of lung field: Secondary | ICD-10-CM | POA: Diagnosis not present

## 2015-03-26 DIAGNOSIS — J9600 Acute respiratory failure, unspecified whether with hypoxia or hypercapnia: Secondary | ICD-10-CM | POA: Diagnosis not present

## 2015-03-26 DIAGNOSIS — I6522 Occlusion and stenosis of left carotid artery: Secondary | ICD-10-CM | POA: Diagnosis not present

## 2015-03-26 DIAGNOSIS — E876 Hypokalemia: Secondary | ICD-10-CM | POA: Diagnosis not present

## 2015-03-26 DIAGNOSIS — I63411 Cerebral infarction due to embolism of right middle cerebral artery: Secondary | ICD-10-CM | POA: Diagnosis not present

## 2015-03-26 DIAGNOSIS — Z87891 Personal history of nicotine dependence: Secondary | ICD-10-CM

## 2015-03-26 DIAGNOSIS — R059 Cough, unspecified: Secondary | ICD-10-CM

## 2015-03-26 DIAGNOSIS — G935 Compression of brain: Secondary | ICD-10-CM | POA: Diagnosis present

## 2015-03-26 DIAGNOSIS — I63319 Cerebral infarction due to thrombosis of unspecified middle cerebral artery: Secondary | ICD-10-CM | POA: Diagnosis not present

## 2015-03-26 DIAGNOSIS — Y92234 Operating room of hospital as the place of occurrence of the external cause: Secondary | ICD-10-CM

## 2015-03-26 DIAGNOSIS — F329 Major depressive disorder, single episode, unspecified: Secondary | ICD-10-CM | POA: Diagnosis not present

## 2015-03-26 DIAGNOSIS — Z9289 Personal history of other medical treatment: Secondary | ICD-10-CM

## 2015-03-26 DIAGNOSIS — R05 Cough: Secondary | ICD-10-CM

## 2015-03-26 DIAGNOSIS — Z01818 Encounter for other preprocedural examination: Secondary | ICD-10-CM

## 2015-03-26 DIAGNOSIS — H53462 Homonymous bilateral field defects, left side: Secondary | ICD-10-CM | POA: Diagnosis present

## 2015-03-26 DIAGNOSIS — J449 Chronic obstructive pulmonary disease, unspecified: Secondary | ICD-10-CM | POA: Diagnosis present

## 2015-03-26 DIAGNOSIS — E441 Mild protein-calorie malnutrition: Secondary | ICD-10-CM | POA: Diagnosis not present

## 2015-03-26 DIAGNOSIS — Z978 Presence of other specified devices: Secondary | ICD-10-CM

## 2015-03-26 DIAGNOSIS — Z923 Personal history of irradiation: Secondary | ICD-10-CM | POA: Diagnosis not present

## 2015-03-26 DIAGNOSIS — N2589 Other disorders resulting from impaired renal tubular function: Secondary | ICD-10-CM | POA: Insufficient documentation

## 2015-03-26 DIAGNOSIS — I2510 Atherosclerotic heart disease of native coronary artery without angina pectoris: Secondary | ICD-10-CM | POA: Diagnosis present

## 2015-03-26 DIAGNOSIS — I6601 Occlusion and stenosis of right middle cerebral artery: Secondary | ICD-10-CM | POA: Diagnosis not present

## 2015-03-26 DIAGNOSIS — I63511 Cerebral infarction due to unspecified occlusion or stenosis of right middle cerebral artery: Secondary | ICD-10-CM | POA: Diagnosis not present

## 2015-03-26 DIAGNOSIS — Z4659 Encounter for fitting and adjustment of other gastrointestinal appliance and device: Secondary | ICD-10-CM

## 2015-03-26 DIAGNOSIS — J9601 Acute respiratory failure with hypoxia: Secondary | ICD-10-CM

## 2015-03-26 HISTORY — PX: PERIPHERAL VASCULAR CATHETERIZATION: SHX172C

## 2015-03-26 HISTORY — PX: RADIOLOGY WITH ANESTHESIA: SHX6223

## 2015-03-26 LAB — BLOOD GAS, ARTERIAL
Acid-base deficit: 14.8 mmol/L — ABNORMAL HIGH (ref 0.0–2.0)
Acid-base deficit: 13.7 mmol/L — ABNORMAL HIGH (ref 0.0–2.0)
BICARBONATE: 12 meq/L — AB (ref 20.0–24.0)
Bicarbonate: 11.9 mEq/L — ABNORMAL LOW (ref 20.0–24.0)
Drawn by: 22766
FIO2: 1 %
FIO2: 0.5 %
MECHVT: 570 mL
MECHVT: 570 mL
O2 SAT: 99.3 %
O2 Saturation: 99.7 %
PATIENT TEMPERATURE: 98.6
PCO2 ART: 32.9 mmHg — AB (ref 35.0–45.0)
PEEP/CPAP: 5 cmH2O
PEEP: 5 cmH2O
PH ART: 7.184 — AB (ref 7.350–7.450)
PO2 ART: 543 mmHg — AB (ref 80.0–100.0)
Patient temperature: 98.6
RATE: 12 resp/min
RATE: 16 resp/min
TCO2: 12.9 mmol/L (ref 0–100)
TCO2: 12.8 mmol/L (ref 0–100)
pCO2 arterial: 27.5 mmHg — ABNORMAL LOW (ref 35.0–45.0)
pH, Arterial: 7.263 — ABNORMAL LOW (ref 7.350–7.450)
pO2, Arterial: 245 mmHg — ABNORMAL HIGH (ref 80.0–100.0)

## 2015-03-26 LAB — POCT I-STAT, CHEM 8
BUN: 57 mg/dL — ABNORMAL HIGH (ref 6–20)
CALCIUM ION: 1.29 mmol/L (ref 1.13–1.30)
CHLORIDE: 114 mmol/L — AB (ref 101–111)
Creatinine, Ser: 2.4 mg/dL — ABNORMAL HIGH (ref 0.61–1.24)
GLUCOSE: 94 mg/dL (ref 65–99)
HCT: 35 % — ABNORMAL LOW (ref 39.0–52.0)
HEMOGLOBIN: 11.9 g/dL — AB (ref 13.0–17.0)
Potassium: 3.2 mmol/L — ABNORMAL LOW (ref 3.5–5.1)
SODIUM: 144 mmol/L (ref 135–145)
TCO2: 15 mmol/L (ref 0–100)

## 2015-03-26 LAB — CBC
HEMATOCRIT: 25.8 % — AB (ref 39.0–52.0)
HEMOGLOBIN: 8.7 g/dL — AB (ref 13.0–17.0)
MCH: 31.2 pg (ref 26.0–34.0)
MCHC: 33.7 g/dL (ref 30.0–36.0)
MCV: 92.5 fL (ref 78.0–100.0)
Platelets: 195 10*3/uL (ref 150–400)
RBC: 2.79 MIL/uL — AB (ref 4.22–5.81)
RDW: 15.1 % (ref 11.5–15.5)
WBC: 14.3 10*3/uL — ABNORMAL HIGH (ref 4.0–10.5)

## 2015-03-26 LAB — MRSA PCR SCREENING: MRSA by PCR: NEGATIVE

## 2015-03-26 LAB — GLUCOSE, CAPILLARY
GLUCOSE-CAPILLARY: 155 mg/dL — AB (ref 65–99)
Glucose-Capillary: 128 mg/dL — ABNORMAL HIGH (ref 65–99)

## 2015-03-26 LAB — TRIGLYCERIDES: Triglycerides: 197 mg/dL — ABNORMAL HIGH (ref <150)

## 2015-03-26 SURGERY — RADIOLOGY WITH ANESTHESIA
Anesthesia: General

## 2015-03-26 SURGERY — CAROTID PTA/STENT INTERVENTION
Anesthesia: LOCAL

## 2015-03-26 MED ORDER — STROKE: EARLY STAGES OF RECOVERY BOOK
Freq: Once | Status: AC
Start: 2015-03-26 — End: 2015-03-26
  Administered 2015-03-26: 20:00:00
  Filled 2015-03-26: qty 1

## 2015-03-26 MED ORDER — SODIUM CHLORIDE 0.9 % IV SOLN
INTRAVENOUS | Status: DC | PRN
  Administered 2015-03-26: 11:00:00 via INTRAVENOUS

## 2015-03-26 MED ORDER — ACETAMINOPHEN 500 MG PO TABS: 1000.0000 mg | ORAL_TABLET | Freq: Four times a day (QID) | ORAL | Status: DC | PRN

## 2015-03-26 MED ORDER — LABETALOL HCL 5 MG/ML IV SOLN
10.0000 mg | INTRAVENOUS | Status: DC | PRN
  Administered 2015-03-26 – 2015-03-31 (×7): 10 mg via INTRAVENOUS
  Filled 2015-03-26 (×5): qty 4

## 2015-03-26 MED ORDER — LIDOCAINE HCL (CARDIAC) 20 MG/ML IV SOLN
INTRAVENOUS | Status: DC | PRN
  Administered 2015-03-26: 40 mg via INTRAVENOUS

## 2015-03-26 MED ORDER — SODIUM CHLORIDE 0.9 % IV SOLN
INTRAVENOUS | Status: DC
  Administered 2015-03-27: 06:00:00 via INTRAVENOUS

## 2015-03-26 MED ORDER — CETYLPYRIDINIUM CHLORIDE 0.05 % MT LIQD
7.0000 mL | Freq: Four times a day (QID) | OROMUCOSAL | Status: DC
  Administered 2015-03-27 – 2015-04-01 (×22): 7 mL via OROMUCOSAL

## 2015-03-26 MED ORDER — SUCCINYLCHOLINE CHLORIDE 20 MG/ML IJ SOLN
INTRAMUSCULAR | Status: DC | PRN
  Administered 2015-03-26: 100 mg via INTRAVENOUS

## 2015-03-26 MED ORDER — FENTANYL CITRATE (PF) 100 MCG/2ML IJ SOLN
50.0000 ug | INTRAMUSCULAR | Status: DC | PRN
  Administered 2015-03-26: 50 ug via INTRAVENOUS
  Filled 2015-03-26: qty 2

## 2015-03-26 MED ORDER — SODIUM CHLORIDE 0.9 % IV SOLN
INTRAVENOUS | Status: DC | PRN
  Administered 2015-03-26 (×2): via INTRAVENOUS

## 2015-03-26 MED ORDER — HYDRALAZINE HCL 20 MG/ML IJ SOLN
10.0000 mg | INTRAMUSCULAR | Status: DC | PRN
  Administered 2015-03-26: 5 mg via INTRAVENOUS
  Administered 2015-03-27 – 2015-03-28 (×2): 10 mg via INTRAVENOUS
  Filled 2015-03-26 (×3): qty 1

## 2015-03-26 MED ORDER — SODIUM CHLORIDE 0.9 % IV SOLN: INTRAVENOUS | Status: DC

## 2015-03-26 MED ORDER — IODIXANOL 320 MG/ML IV SOLN
INTRAVENOUS | Status: DC | PRN
  Administered 2015-03-26: 21 mL via INTRA_ARTERIAL

## 2015-03-26 MED ORDER — FLUOXETINE HCL 10 MG PO CAPS
10.0000 mg | ORAL_CAPSULE | Freq: Every day | ORAL | Status: DC
  Filled 2015-03-26: qty 1

## 2015-03-26 MED ORDER — ONDANSETRON HCL 4 MG/2ML IJ SOLN
4.0000 mg | Freq: Four times a day (QID) | INTRAMUSCULAR | Status: DC | PRN
  Administered 2015-03-28: 4 mg via INTRAVENOUS
  Filled 2015-03-26: qty 2

## 2015-03-26 MED ORDER — HYDRALAZINE HCL 20 MG/ML IJ SOLN
INTRAMUSCULAR | Status: DC | PRN
  Administered 2015-03-26: 5 mg via INTRAVENOUS

## 2015-03-26 MED ORDER — ATORVASTATIN CALCIUM 10 MG PO TABS
10.0000 mg | ORAL_TABLET | Freq: Every day | ORAL | Status: DC
  Filled 2015-03-26: qty 1

## 2015-03-26 MED ORDER — NITROGLYCERIN 1 MG/10 ML FOR IR/CATH LAB
INTRA_ARTERIAL | Status: AC
  Administered 2015-03-26 (×2): 25 ug
  Filled 2015-03-26: qty 10

## 2015-03-26 MED ORDER — CHLORHEXIDINE GLUCONATE 0.12 % MT SOLN
15.0000 mL | Freq: Two times a day (BID) | OROMUCOSAL | Status: DC
  Administered 2015-03-26 – 2015-04-01 (×12): 15 mL via OROMUCOSAL
  Filled 2015-03-26 (×12): qty 15

## 2015-03-26 MED ORDER — HEPARIN (PORCINE) IN NACL 2-0.9 UNIT/ML-% IJ SOLN
INTRAMUSCULAR | Status: AC
  Filled 2015-03-26: qty 1000

## 2015-03-26 MED ORDER — ATROPINE SULFATE 0.1 MG/ML IJ SOLN
INTRAMUSCULAR | Status: AC
  Filled 2015-03-26: qty 10

## 2015-03-26 MED ORDER — SENNOSIDES-DOCUSATE SODIUM 8.6-50 MG PO TABS
1.0000 | ORAL_TABLET | Freq: Every evening | ORAL | Status: DC | PRN
  Filled 2015-03-26: qty 1

## 2015-03-26 MED ORDER — GABAPENTIN 100 MG PO CAPS
100.0000 mg | ORAL_CAPSULE | Freq: Two times a day (BID) | ORAL | Status: DC
  Filled 2015-03-26 (×2): qty 1

## 2015-03-26 MED ORDER — PHENYLEPHRINE HCL 10 MG/ML IJ SOLN
10.0000 mg | INTRAVENOUS | Status: DC | PRN
  Administered 2015-03-26: 20 ug/min via INTRAVENOUS

## 2015-03-26 MED ORDER — FENTANYL CITRATE (PF) 100 MCG/2ML IJ SOLN
50.0000 ug | INTRAMUSCULAR | Status: DC | PRN
  Administered 2015-03-26 – 2015-04-01 (×9): 50 ug via INTRAVENOUS
  Filled 2015-03-26 (×8): qty 2

## 2015-03-26 MED ORDER — POTASSIUM CHLORIDE 10 MEQ/100ML IV SOLN
10.0000 meq | INTRAVENOUS | Status: AC
  Administered 2015-03-26 (×4): 10 meq via INTRAVENOUS
  Filled 2015-03-26 (×4): qty 100

## 2015-03-26 MED ORDER — SODIUM CHLORIDE 0.9 % IV SOLN
INTRAVENOUS | Status: DC
  Administered 2015-03-26: 06:00:00 via INTRAVENOUS

## 2015-03-26 MED ORDER — ALTEPLASE 30 MG/30 ML FOR INTERV. RAD
1.0000 mg | INTRA_ARTERIAL | Status: AC | PRN
  Administered 2015-03-26: 2 mg via INTRA_ARTERIAL
  Administered 2015-03-26: 5 mg via INTRA_ARTERIAL
  Filled 2015-03-26: qty 30

## 2015-03-26 MED ORDER — IOHEXOL 300 MG/ML  SOLN
450.0000 mL | Freq: Once | INTRAMUSCULAR | Status: AC | PRN
  Administered 2015-03-26: 150 mL via INTRAVENOUS

## 2015-03-26 MED ORDER — ROCURONIUM BROMIDE 100 MG/10ML IV SOLN
INTRAVENOUS | Status: DC | PRN
  Administered 2015-03-26: 20 mg via INTRAVENOUS
  Administered 2015-03-26: 50 mg via INTRAVENOUS
  Administered 2015-03-26: 30 mg via INTRAVENOUS

## 2015-03-26 MED ORDER — NITROGLYCERIN IN D5W 200-5 MCG/ML-% IV SOLN
INTRAVENOUS | Status: DC | PRN
  Administered 2015-03-26: 5 ug/min via INTRAVENOUS

## 2015-03-26 MED ORDER — PANTOPRAZOLE SODIUM 40 MG IV SOLR
40.0000 mg | Freq: Every day | INTRAVENOUS | Status: DC
  Administered 2015-03-26: 40 mg via INTRAVENOUS
  Filled 2015-03-26 (×2): qty 40

## 2015-03-26 MED ORDER — LIDOCAINE HCL (PF) 1 % IJ SOLN
INTRAMUSCULAR | Status: AC
  Filled 2015-03-26: qty 30

## 2015-03-26 MED ORDER — NICARDIPINE HCL IN NACL 20-0.86 MG/200ML-% IV SOLN: 5.0000 mg/h | INTRAVENOUS | Status: DC

## 2015-03-26 MED ORDER — HYDRALAZINE HCL 20 MG/ML IJ SOLN
5.0000 mg | INTRAMUSCULAR | Status: DC | PRN
  Administered 2015-03-26: 5 mg via INTRAVENOUS
  Filled 2015-03-26: qty 1

## 2015-03-26 MED ORDER — ACETAMINOPHEN 650 MG RE SUPP: 650.0000 mg | Freq: Four times a day (QID) | RECTAL | Status: DC | PRN

## 2015-03-26 MED ORDER — PROPOFOL 10 MG/ML IV BOLUS
INTRAVENOUS | Status: DC | PRN
  Administered 2015-03-26: 130 mg via INTRAVENOUS

## 2015-03-26 MED ORDER — ATORVASTATIN CALCIUM 10 MG PO TABS
10.0000 mg | ORAL_TABLET | Freq: Every day | ORAL | Status: DC
  Administered 2015-03-28 – 2015-03-31 (×4): 10 mg
  Filled 2015-03-26 (×6): qty 1

## 2015-03-26 MED ORDER — PROPOFOL 1000 MG/100ML IV EMUL
0.0000 ug/kg/min | INTRAVENOUS | Status: DC
  Administered 2015-03-26: 40 ug/kg/min via INTRAVENOUS
  Administered 2015-03-27: 20 ug/kg/min via INTRAVENOUS
  Administered 2015-03-27: 15 ug/kg/min via INTRAVENOUS
  Administered 2015-03-29: 20 ug/kg/min via INTRAVENOUS
  Filled 2015-03-26 (×6): qty 100

## 2015-03-26 MED ORDER — ATORVASTATIN CALCIUM 10 MG PO TABS: 10.0000 mg | ORAL_TABLET | Freq: Every day | ORAL | Status: AC

## 2015-03-26 MED ORDER — MIDAZOLAM HCL 5 MG/5ML IJ SOLN
INTRAMUSCULAR | Status: DC | PRN
  Administered 2015-03-26: 1 mg via INTRAVENOUS

## 2015-03-26 MED ORDER — ACETAMINOPHEN 650 MG RE SUPP: 650.0000 mg | RECTAL | Status: DC | PRN

## 2015-03-26 MED ORDER — GLIMEPIRIDE 1 MG PO TABS
1.0000 mg | ORAL_TABLET | Freq: Two times a day (BID) | ORAL | Status: DC
  Filled 2015-03-26 (×2): qty 1

## 2015-03-26 MED ORDER — ALTEPLASE (STROKE) FULL DOSE INFUSION
55.0000 mg | Freq: Once | INTRAVENOUS | Status: AC
  Administered 2015-03-26: 55 mg via INTRAVENOUS
  Filled 2015-03-26: qty 55

## 2015-03-26 MED ORDER — FENTANYL CITRATE (PF) 100 MCG/2ML IJ SOLN
INTRAMUSCULAR | Status: DC | PRN
  Administered 2015-03-26: 75 ug via INTRAVENOUS
  Administered 2015-03-26 (×3): 50 ug via INTRAVENOUS
  Administered 2015-03-26: 25 ug via INTRAVENOUS

## 2015-03-26 MED ORDER — FLUOXETINE HCL 20 MG/5ML PO SOLN
10.0000 mg | Freq: Every day | ORAL | Status: DC
  Filled 2015-03-26 (×3): qty 2.5

## 2015-03-26 MED ORDER — PROPOFOL INFUSION 10 MG/ML OPTIME
INTRAVENOUS | Status: DC | PRN
  Administered 2015-03-26: 50 ug/kg/min via INTRAVENOUS

## 2015-03-26 MED ORDER — ACETAMINOPHEN 325 MG PO TABS: 650.0000 mg | ORAL_TABLET | ORAL | Status: DC | PRN

## 2015-03-26 MED ORDER — INSULIN ASPART 100 UNIT/ML ~~LOC~~ SOLN
0.0000 [IU] | SUBCUTANEOUS | Status: DC
  Administered 2015-03-26: 3 [IU] via SUBCUTANEOUS
  Administered 2015-03-26 – 2015-03-27 (×2): 2 [IU] via SUBCUTANEOUS
  Administered 2015-03-27: 3 [IU] via SUBCUTANEOUS
  Administered 2015-03-27: 2 [IU] via SUBCUTANEOUS
  Administered 2015-03-28: 3 [IU] via SUBCUTANEOUS
  Administered 2015-03-28: 2 [IU] via SUBCUTANEOUS
  Administered 2015-03-28: 3 [IU] via SUBCUTANEOUS
  Administered 2015-03-28 (×2): 2 [IU] via SUBCUTANEOUS
  Administered 2015-03-28 (×2): 3 [IU] via SUBCUTANEOUS
  Administered 2015-03-29 – 2015-03-30 (×9): 2 [IU] via SUBCUTANEOUS
  Administered 2015-03-31: 3 [IU] via SUBCUTANEOUS
  Administered 2015-03-31: 2 [IU] via SUBCUTANEOUS
  Administered 2015-03-31 (×3): 3 [IU] via SUBCUTANEOUS
  Administered 2015-03-31: 2 [IU] via SUBCUTANEOUS
  Administered 2015-04-01 (×3): 3 [IU] via SUBCUTANEOUS

## 2015-03-26 SURGICAL SUPPLY — 14 items
CATH ANGIO 5F BER2 100CM (CATHETERS) ×1 IMPLANT
CATH ANGIO 5F SIM1 100CM (CATHETERS) ×1 IMPLANT
CATH HEADHUNTER H1 5F 100CM (CATHETERS) ×2 IMPLANT
COVER PRB 48X5XTLSCP FOLD TPE (BAG) ×1 IMPLANT
COVER PROBE 5X48 (BAG) ×2
KIT MICROINTRODUCER STIFF 5F (SHEATH) ×2 IMPLANT
KIT PV (KITS) ×1 IMPLANT
SHEATH PINNACLE 5F 10CM (SHEATH) ×1 IMPLANT
SHIELD RADPAD SCOOP 12X17 (MISCELLANEOUS) ×1 IMPLANT
SYR MEDRAD MARK V 150ML (SYRINGE) ×1 IMPLANT
TRANSDUCER W/STOPCOCK (MISCELLANEOUS) ×2 IMPLANT
TRAY PV CATH (CUSTOM PROCEDURE TRAY) ×1 IMPLANT
WIRE BENTSON .035X145CM (WIRE) ×1 IMPLANT
WIRE HI TORQ VERSACORE J 260CM (WIRE) ×2 IMPLANT

## 2015-03-26 NOTE — Progress Notes (Signed)
15:20 On arrival to ICU, hematoma noted at L radial A-line sight. Able to transduce pressure but hand cool, capillary refill 3 seconds. MD visualized and gave order to remove and continue vascular checks post-removal. Aline removed, pressure held for about 10 minutes, no bleeding noted. Pressure dressing applied. Approximately 15:30 Also noted bleeding from R femoral site, but no hematoma or VS changes. Dr. Estanislado Pandy aware, 10 lb sandbag applied. Distal pulses strong.   On repeated checks post art-line removal, Capillary refill remained <3 seconds, hand warmed (pt on bair hugger) and 02 sat probe placed on L hand reading 100% at all times.

## 2015-03-26 NOTE — Transfer of Care (Signed)
Immediate Anesthesia Transfer of Care Note  Patient: [name]  Procedure(s) Performed: Procedure(s): RADIOLOGY WITH ANESTHESIA (N/A)  Patient Location: ICU  Anesthesia Type:General  Level of Consciousness: sedated, unresponsive and Patient remains intubated per anesthesia plan  Airway & Oxygen Therapy: Patient remains intubated per anesthesia plan and Patient placed on Ventilator (see vital sign flow sheet for setting)  Post-op Assessment: Report given to RN and Post -op Vital signs reviewed and stable  Post vital signs: Reviewed and stable  Last Vitals:  Filed Vitals:   03/05/2015 0544  BP: 124/70  Pulse: 73  Temp: 36.4 C  Resp: 18    Complications: No apparent anesthesia complications

## 2015-03-26 NOTE — H&P (View-Only) (Signed)
Consult Note  Patient name: [name] MRN: [id number] DOB: 11-Feb-1941 Sex: male  Consulting Physician:  Hospital service  Reason for Consult:  Chief Complaint  Patient presents with  . Extremity Weakness    HISTORY OF PRESENT ILLNESS: This is a 74 year old who is s/p endovascular AAA repair with coil embolization of the right hypogastric artery using a Cook Zenith device by Dr. Amedeo Plenty in 3/22010.  He is followed by Dr. Scot Dock for this as Dr Amedeo Plenty has left our practice.  He presented to the ER on 5/16 with weakness in his right leg and dysarthria.  He had 2 episodes. When he arrived, his symptoms had resolved.  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  He is medically managed for hypertension.  He is treated for hypercholesterolemia with a statin.  He has chronic renal insufficiency.  His recent creatinine was 2.7.  He has a history of MI with history of stenting in 1998.  He also has a history of salivary gland cancer, s/p peft parotidectomy with lymph node dissection in 2015, also treated with [id number].  Past Medical History  Diagnosis Date  . Hypertension   . Anxiety   . COPD (chronic obstructive pulmonary disease)   . Hyperlipemia   . GERD (gastroesophageal reflux disease)   . Barrett's esophagus with high grade dysplasia     s/p RFA  . PVD (peripheral vascular disease)   . CAD (coronary artery disease)   . Tobacco user   . Depression   . Hiatal hernia   . Myocardial infarction 12/1996  . AAA (abdominal aortic aneurysm)     Cook Zenith, safe  . Renal insufficiency 2010  . Bruises easily     on hands  . Pneumonia 06/22/14  . Type II diabetes mellitus   . 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 Cancer     PAROTID/LYMPH NODE CANCER LEFT SIDE  . History of radiation therapy 09/23/14-11/12/14    left neck/parotid bed 66 Gy 33 fx    Past Surgical History  Procedure Laterality Date  . Tonsillectomy    . Nissen fundoplication    .  Abdominal aortic aneurysm repair  2010    and right common iliac artery aneurysm, DR HAYES  . Upper gastrointestinal endoscopy  07/07/2011    barretts esophagus, hiatal hernia, gastritis, ablations in esophagus  . Colonoscopy  05/07/2005    diverticulosis, internal and external hemorrhoids  . Radiofrequency ablation  multiple    Barrett's, high-grade dysplasia  . Colonoscopy N/A 03/01/2013    Procedure: COLONOSCOPY;  Surgeon: Gatha Mayer, MD;  Location: [facility];  Service: Endoscopy;  Laterality: N/A;  . Coronary angioplasty with stent placement  1998    "1"  . Parotidectomy w/ neck dissection total Left 08/13/2014    PARTIAL NECK DISSECTION  . Hernia repair    . Parotidectomy Left 08/13/2014    Procedure: PAROTIDECTOMY WITH LIMITED LEFT NECK DISSECTION;  Surgeon: Rozetta Nunnery, MD;  Location: [place];  Service: ENT;  Laterality: Left;    History   Social History  . Marital Status: Married    Spouse Name: N/A  . Number of Children: 3  . Years of Education: N/A   Occupational History  . Retired [employer] manager    Social History Main Topics  . Smoking status: Former Smoker -- 1.00 packs/day for 58 years    Types: Cigarettes  Quit date: 05/09/2014  . Smokeless tobacco: Never Used     Comment: pt uses e-cigs  . 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 Drug Use: No  . Sexual Activity: Not Currently   Other Topics Concern  . Not on file   Social History Narrative   Regular Exercise -  NO    Family History  Problem Relation Age of Onset  . Coronary artery disease Other   . Diabetes Other   . Asthma Other   . Mental illness Mother     dementia  . Cancer Mother   . Diabetes Mother   . Heart disease Father   . Diabetes Father   . Stomach cancer Father   . Cancer Father   . Colon cancer Neg Hx   . Esophageal cancer Neg Hx   . Heart attack Neg Hx   . Stroke Neg Hx     Allergies as of 03/18/2015 - Review Complete 03/18/2015  Allergen Reaction Noted    . Amlodipine besylate Swelling 08/06/2010  . Benazepril hcl Other (See Comments) 08/06/2010  . Risperidone Other (See Comments) 01/19/2011  . Tramadol Nausea And Vomiting 07/07/2011  . Valsartan Other (See Comments) 08/06/2010  . Penicillins Itching and Rash 08/31/2007    No current facility-administered medications on file prior to encounter.   Current Outpatient Prescriptions on File Prior to Encounter  Medication Sig Dispense Refill  . ALPRAZolam (XANAX) 0.5 MG tablet TAKE 1 TABLET TWICE A DAY AS NEEDED 60 tablet 3  . aspirin EC 81 MG tablet Take 81 mg by mouth every morning.     Marland Kitchen atorvastatin (LIPITOR) 10 MG tablet Take 10 mg by mouth daily.     . calcium-vitamin D (OSCAL WITH D) 500-200 MG-UNIT per tablet Take 1 tablet by mouth 2 (two) times daily.     . cholecalciferol (VITAMIN D) 1000 UNITS tablet Take 2,000 Units by mouth 2 (two) times daily.     . cloNIDine (CATAPRES) 0.1 MG tablet Take 1 tablet (0.1 mg total) by mouth 2 (two) times daily. 60 tablet 3  . FLUoxetine (PROZAC) 10 MG capsule Take 1 capsule (10 mg total) by mouth daily. 90 capsule 3  . gabapentin (NEURONTIN) 100 MG capsule Take 1 capsule (100 mg total) by mouth 2 (two) times daily. 180 capsule 3  . glimepiride (AMARYL) 1 MG tablet Take 1 tablet (1 mg total) by mouth 2 (two) times daily. 180 tablet 3  . iron polysaccharides (NIFEREX) 150 MG capsule Take 1 capsule (150 mg total) by mouth daily. 30 capsule 2  . labetalol (NORMODYNE) 200 MG tablet Take 0.5 tablets (100 mg total) by mouth 3 (three) times daily. (Patient taking differently: Take 100 mg by mouth 2 (two) times daily. Take 0.5 tablet (100 mg) in the am & Take 1 tablet (200 mg) at bedtime.) 270 tablet 2  . Magnesium 300 MG CAPS Take 300 mg by mouth daily.    . mirtazapine (REMERON) 15 MG tablet Take 1-2 tablets (15-30 mg total) by mouth at bedtime as needed (for sleep). 90 tablet 3  . Multiple Vitamin (MULTIVITAMIN WITH MINERALS) TABS tablet Take 1 tablet by  mouth every morning.    Marland Kitchen omeprazole (PRILOSEC) 40 MG capsule Take 1 capsule (40 mg total) by mouth 2 (two) times daily. 180 capsule 3  . sodium bicarbonate 650 MG tablet Take 1,300 mg by mouth every morning.     . temazepam (RESTORIL) 30 MG capsule Take  30 mg by mouth at bedtime.     Marland Kitchen Umeclidinium-Vilanterol (ANORO ELLIPTA) 62.5-25 MCG/INH AEPB Inhale 1 puff into the lungs daily.    Marland Kitchen glucose blood (ONE TOUCH TEST STRIPS) test strip Use as instructed 300 each 3  . ONETOUCH DELICA LANCETS FINE MISC        REVIEW OF SYSTEMS: Constitutional:  No weight loss, night sweats, Fevers, chills, +fatigue.  HEENT:  No headaches, itching, ear ache, nasal congestion, post nasal drip,  Cardio-vascular:  No chest pain, PND, swelling in lower extremities dizziness  GI:  No heartburn, indigestion, abdominal pain, nausea, vomiting, diarrhea  Resp:  No shortness of breath with exertion or at rest. No coughing up of blood.No change in color of mucus.No wheezing Skin:  no rash or lesion GU:  no dysuria, change in color of urine, no urgency or frequency Musculoskeletal:  No joint pain or swelling. No decreased range of motion Psych:  No change in mood or affect. No memory loss.   PHYSICAL EXAMINATION: General: The patient appears their stated age.  Vital signs are BP 155/61 mmHg  Pulse 68  Temp(Src) 98.1 F (36.7 C) (Oral)  Resp 16  Ht 5\' 9"  (1.753 m)  Wt 133 lb 2.5 oz (60.4 kg)  BMI 19.66 kg/m2  SpO2 98% Pulmonary: Respirations are non-labored HEENT:  No gross abnormalities Abdomen: Soft and non-tender  Musculoskeletal: There are no major deformities.   Neurologic: No focal weakness or paresthesias are detected, Skin: There are no ulcer or rashes noted. Psychiatric: The patient has normal affect. Cardiovascular: There is a regular rate and rhythm without significant murmur appreciated.  Diagnostic Studies: I have reviewed his carotid duplex which shows 1% to 39% upper end  of scale ICA stenosis. ECA stenosis. Vertebral artery flow is antegrade. Left - Greater than 80% ICA stenosis at the bulb/ proximal ICA and decrease to 1% to 39% upper end of scale In the mid ICA. ECA stenosis. Vertebral artery flow is antegrade  MRI and MRA reveal the following: No acute large vessel occlusion or high-grade stenosis. Complete circle of Willis. Mild luminal regularity of the proximal cerebral arteries most consistent with atheroscleros Remote LEFT corona radiata lacunar infarct. Moderate to severe white matter changes suggest chronic small vessel ischemic disease.      Assessment:  TIA with left carotid stenosis, >80% Plan: The patient is now on full dose ASA.  He likely had a TIA secondary to left ICA stenosis.  HE will need carotid intervention.  Given his recent XRT, I feel he is a better candidate for carotid stenting.  The risks and benefits were discussed with the patient which include but are not limited to stroke, access site complications, and cardiac.  I'm going to start the patient on Plavix.  It is okay for him to take a baby aspirin as well.  He needs dual antiplatelet therapy for carotid stenting.  From a scheduling perspective I had this tentatively scheduled for either Tuesday or Wednesday of next week.  It is okay for the patient to be discharged home from my perspective.  I did discuss this with the patient and his wife who is at the bedside.     Eldridge Abrahams, M.D. [facility] Office: [phone] Pager:  [phone]

## 2015-03-26 NOTE — Anesthesia Postprocedure Evaluation (Signed)
  Anesthesia Post-op Note  Patient: [name]  Procedure(s) Performed: Procedure(s): RADIOLOGY WITH ANESTHESIA (N/A)  Patient Location: ICU  Anesthesia Type:General  Level of Consciousness: sedated and unresponsive  Airway and Oxygen Therapy: Patient remains intubated and on ventilator  Post-op Pain: none  Post-op Assessment: Post-op Vital signs reviewed, Patient's Cardiovascular Status Stable and Respiratory Function Stable  Post-op Vital Signs: Reviewed  Filed Vitals:   03/22/2015 0544  BP: 124/70  Pulse: 73  Temp: 36.4 C  Resp: 18    Complications: No apparent anesthesia complications

## 2015-03-26 NOTE — Interval H&P Note (Signed)
History and Physical Interval Note:  03/13/2015 9:33 AM  [name]  has presented today for surgery, with the diagnosis of pvd  The various methods of treatment have been discussed with the patient and family. After consideration of risks, benefits and other options for treatment, the patient has consented to  Procedure(s): Carotid PTA/Stent Intervention (N/A) as a surgical intervention .  The patient's history has been reviewed, patient examined, no change in status, stable for surgery.  I have reviewed the patient's chart and labs.  Questions were answered to the patient's satisfaction.     Annamarie Major

## 2015-03-26 NOTE — H&P (Signed)
H&P    Chief Complaint: Stroke  HPI:                                                                                                                                         [name] is an 74 y.o. male with known left ICA stenosis who was undergoing a left ICA stent procedure today.  Patient was placed on the table and groin sheath placed. 25 cc dye was injected when patient was noted to have left sided flaccidity and right eye deviation. Code stroke was called and patient was brought straight to IR.  There CT scan of head was obtained showing no bleed, Patient was intubated for blood pressure control and IV tPA was administered.  Due to concern this is a large vessel occlusion cerebral angiogram will be obtained with possible intervention.   Date last known well: Date: 03/24/2015 Time last known well: Time: 10:25 tPA Given: Yes   Modified Rankin: Rankin Score=0    Past Medical History  Diagnosis Date  . Hypertension   . Anxiety   . COPD (chronic obstructive pulmonary disease)   . Hyperlipemia   . GERD (gastroesophageal reflux disease)   . Barrett's esophagus with high grade dysplasia     s/p RFA  . PVD (peripheral vascular disease)   . CAD (coronary artery disease)   . Tobacco user   . Depression   . Hiatal hernia   . Myocardial infarction 12/1996  . AAA (abdominal aortic aneurysm)     Cook Zenith, safe  . Renal insufficiency 2010  . Bruises easily     on hands  . Pneumonia 06/22/14  . Type II diabetes mellitus   . 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 Cancer     PAROTID/LYMPH NODE CANCER LEFT SIDE  . History of radiation therapy 09/23/14-11/12/14    left neck/parotid bed 66 Gy 33 fx    Past Surgical History  Procedure Laterality Date  . Tonsillectomy    . Nissen fundoplication    . Abdominal aortic aneurysm repair  2010    and right common iliac artery aneurysm, DR HAYES  . Upper gastrointestinal endoscopy  07/07/2011    barretts  esophagus, hiatal hernia, gastritis, ablations in esophagus  . Colonoscopy  05/07/2005    diverticulosis, internal and external hemorrhoids  . Radiofrequency ablation  multiple    Barrett's, high-grade dysplasia  . Colonoscopy N/A 03/01/2013    Procedure: COLONOSCOPY;  Surgeon: Gatha Mayer, MD;  Location: [facility];  Service: Endoscopy;  Laterality: N/A;  . Coronary angioplasty with stent placement  1998    "1"  . Parotidectomy w/ neck dissection total Left 08/13/2014    PARTIAL NECK DISSECTION  . Hernia repair    . Parotidectomy Left 08/13/2014    Procedure: PAROTIDECTOMY WITH LIMITED LEFT NECK DISSECTION;  Surgeon: Rozetta Nunnery,  MD;  Location: [facility];  Service: ENT;  Laterality: Left;    Family History  Problem Relation Age of Onset  . Coronary artery disease Other   . Diabetes Other   . Asthma Other   . Mental illness Mother     dementia  . Cancer Mother   . Diabetes Mother   . Heart disease Father   . Diabetes Father   . Stomach cancer Father   . Cancer Father   . Colon cancer Neg Hx   . Esophageal cancer Neg Hx   . Heart attack Neg Hx   . Stroke Neg Hx    Social History:  reports that he quit smoking about 10 months ago. His smoking use included Cigarettes. He has a 58 pack-year smoking history. He has never used smokeless tobacco. He reports that he does not drink alcohol or use illicit drugs.  Allergies:  Allergies  Allergen Reactions  . Amlodipine Besylate Swelling  . Benazepril Hcl Other (See Comments)    Elevates potassium levels  . Risperidone Other (See Comments)    loss of motor skills  . Tramadol Nausea And Vomiting  . Valsartan Other (See Comments)    Elevates potassium levels  . Penicillins Itching and Rash    Medications:                                                                                                                           Prior to Admission:  Prescriptions prior to admission  Medication Sig Dispense Refill Last Dose   . ALPRAZolam (XANAX) 0.5 MG tablet TAKE 1 TABLET TWICE A DAY AS NEEDED 60 tablet 3 03/25/2015 at Unknown time  . aspirin EC 81 MG tablet Take 81 mg by mouth every morning.    03/31/2015 at 0400  . calcium-vitamin D (OSCAL WITH D) 500-200 MG-UNIT per tablet Take 1 tablet by mouth 2 (two) times daily.    03/25/2015 at Unknown time  . cholecalciferol (VITAMIN D) 1000 UNITS tablet Take 2,000 Units by mouth 2 (two) times daily.    03/25/2015 at Unknown time  . cloNIDine (CATAPRES) 0.1 MG tablet Take 1 tablet (0.1 mg total) by mouth 2 (two) times daily. 60 tablet 3 03/14/2015 at 0400  . clopidogrel (PLAVIX) 75 MG tablet Take 1 tablet (75 mg total) by mouth daily. 30 tablet 0 03/07/2015 at 0400  . FLUoxetine (PROZAC) 10 MG capsule Take 1 capsule (10 mg total) by mouth daily. 90 capsule 3 03/25/2015 at Unknown time  . gabapentin (NEURONTIN) 100 MG capsule Take 1 capsule (100 mg total) by mouth 2 (two) times daily. 180 capsule 3 03/25/2015 at Unknown time  . glimepiride (AMARYL) 1 MG tablet Take 1 tablet (1 mg total) by mouth 2 (two) times daily. 180 tablet 3 03/25/2015 at Unknown time  . glucose blood (ONE TOUCH TEST STRIPS) test strip Use as instructed 300 each 3 unknown at unknown time  . iron polysaccharides (NIFEREX) 150 MG capsule Take  1 capsule (150 mg total) by mouth daily. 30 capsule 2 03/25/2015 at Unknown time  . labetalol (NORMODYNE) 200 MG tablet Take 0.5 tablets (100 mg total) by mouth 3 (three) times daily. (Patient taking differently: Take 100 mg by mouth 2 (two) times daily. Take 0.5 tablet (100 mg) in the am & Take 1 tablet (200 mg) at bedtime.) 270 tablet 2 03/25/2015 at Unknown time  . Magnesium 300 MG CAPS Take 300 mg by mouth daily.   03/25/2015 at Unknown time  . mirtazapine (REMERON) 15 MG tablet Take 1-2 tablets (15-30 mg total) by mouth at bedtime as needed (for sleep). 90 tablet 3 03/25/2015 at Unknown time  . Multiple Vitamin (MULTIVITAMIN WITH MINERALS) TABS tablet Take 1 tablet by mouth every  morning.   03/25/2015 at Unknown time  . omeprazole (PRILOSEC) 40 MG capsule Take 1 capsule (40 mg total) by mouth 2 (two) times daily. 180 capsule 3 03/25/2015 at Unknown time  . ONETOUCH DELICA LANCETS FINE MISC    unknown at unknown time  . sodium bicarbonate 650 MG tablet Take 1,300 mg by mouth every morning.    03/25/2015 at Unknown time  . temazepam (RESTORIL) 30 MG capsule Take 30 mg by mouth at bedtime.    03/25/2015 at Unknown time  . Umeclidinium-Vilanterol (ANORO ELLIPTA) 62.5-25 MCG/INH AEPB Inhale 1 puff into the lungs daily.   03/25/2015 at Unknown time   Scheduled: . alteplase  55 mg Intravenous Once  . atorvastatin  10 mg Oral Daily  . FLUoxetine  10 mg Oral Daily  . gabapentin  100 mg Oral BID  . glimepiride  1 mg Oral BID  . nitroGLYCERIN        ROS:                                                                                                                                       History obtained from unobtainable from patient due to mental status   Neurologic Examination:                                                                                                      Blood pressure 124/70, pulse 73, temperature 97.6 F (36.4 C), temperature source Oral, resp. rate 18, height 5\' 9"  (1.753 m), weight 61.236 kg (135 lb), SpO2 100 %.  HEENT-  Normocephalic, no lesions, without obvious abnormality.  Normal external eye and conjunctiva.  Normal TM's bilaterally.  Normal auditory canals and external ears. Normal external  nose, mucus membranes and septum.  Normal pharynx. Cardiovascular- S1, S2 normal, pulses palpable throughout   Lungs- chest clear, no wheezing, rales, normal symmetric air entry Abdomen- normal findings: bowel sounds normal Extremities- no edema Lymph-no adenopathy palpable Musculoskeletal-no joint tenderness, deformity or swelling Skin-warm and dry, no hyperpigmentation, vitiligo, or suspicious lesions  Neurological Examination Mental Status: Alert,  oriented, thought content appropriate.  Speech dysarthric without evidence of aphasia.  Able to follow 2 step commands without difficulty. Cranial Nerves: II: Discs flat bilaterally; Visual fields left hemianopsia, pupils equal, round, reactive to light and accommodation III,IV, VI: ptosis not present, right gaze deviation V,VII: smile asymmetric on the left, facial light touch sensation decreased on the left VIII: hearing normal bilaterally IX,X: uvula rises symmetrically XI: bilateral shoulder shrug XII: midline tongue extension Motor: Right : Upper extremity   5/5    Left:     Upper extremity   0/5  Lower extremity   5/5     Lower extremity   0/5 Tone and bulk:normal tone throughout; no atrophy noted Sensory: Pinprick and light touch decreased on the left Deep Tendon Reflexes: 2+ and symmetric throughout Plantars: Right: downgoing   Left: downgoing Cerebellar: Unable to fully test due to lack of following commands Gait: not able to assess       Lab Results: Basic Metabolic Panel:  Recent Labs Lab 03/20/15 0537 03/27/2015 0609  NA 141 144  K 3.3* 3.2*  CL 114* 114*  CO2 17*  --   GLUCOSE 104* 94  BUN 55* 57*  CREATININE 2.17* 2.40*  CALCIUM 9.8  --     Liver Function Tests: No results for input(s): AST, ALT, ALKPHOS, BILITOT, PROT, ALBUMIN in the last 168 hours. No results for input(s): LIPASE, AMYLASE in the last 168 hours. No results for input(s): AMMONIA in the last 168 hours.  CBC:  Recent Labs Lab 03/20/15 0537 03/25/2015 0609  WBC 9.1  --   HGB 10.5* 11.9*  HCT 32.2* 35.0*  MCV 92.3  --   PLT 210  --     Cardiac Enzymes: No results for input(s): CKTOTAL, CKMB, CKMBINDEX, TROPONINI in the last 168 hours.  Lipid Panel: No results for input(s): CHOL, TRIG, HDL, CHOLHDL, VLDL, LDLCALC in the last 168 hours.  CBG:  Recent Labs Lab 03/19/15 1217 03/19/15 1654 03/19/15 2126 03/20/15 0624 03/20/15 1113  GLUCAP 104* 158* 126* 118* 117*     Microbiology: Results for orders placed or performed during the hospital encounter of 04/29/14  Culture, blood (routine x 2)     Status: None   Collection Time: 04/29/14 10:05 PM  Result Value Ref Range Status   Specimen Description BLOOD L ARM  Final   Special Requests BOTTLES DRAWN AEROBIC AND ANAEROBIC 10CC EACH  Final   Culture  Setup Time   Final    04/30/2014 01:48 Performed at [facility]   Culture   Final    NO GROWTH 5 DAYS Performed at [facility]   Report Status 05/06/2014 FINAL  Final  Culture, blood (routine x 2)     Status: None   Collection Time: 04/29/14 10:08 PM  Result Value Ref Range Status   Specimen Description BLOOD R ARM  Final   Special Requests BOTTLES DRAWN AEROBIC AND ANAEROBIC 10 CC EACH  Final   Culture  Setup Time   Final    04/30/2014 01:48 Performed at [facility]   Final    NO GROWTH 5  DAYS Performed at [facility]   Report Status 05/06/2014 FINAL  Final  Culture, expectorated sputum-assessment     Status: None   Collection Time: 05/01/14  5:48 AM  Result Value Ref Range Status   Specimen Description SPUTUM  Final   Special Requests NONE  Final   Sputum evaluation   Final    THIS SPECIMEN IS ACCEPTABLE. RESPIRATORY CULTURE REPORT TO FOLLOW.   Report Status 05/01/2014 FINAL  Final  Culture, respiratory (NON-Expectorated)     Status: None   Collection Time: 05/01/14  5:48 AM  Result Value Ref Range Status   Specimen Description SPUTUM  Final   Special Requests NONE  Final   Gram Stain   Final    ABUNDANT WBC PRESENT,BOTH PMN AND MONONUCLEAR NO SQUAMOUS EPITHELIAL CELLS SEEN RARE GRAM POSITIVE COCCI IN PAIRS IN CHAINS Performed at [facility]   Culture   Final    RARE CANDIDA ALBICANS Performed at [facility]   Report Status 05/03/2014 FINAL  Final    Coagulation Studies: No results for input(s): LABPROT, INR in the last 72 hours.  Imaging: No results  found.     Assessment and plan discussed with with attending physician and they are in agreement.    Myquan Schaumburg PA-C [facility] [phone]  03/03/2015, 11:18 AM   Assessment: 74 y.o. male with hx of HTN, HLD, recent TIA with known left ICA stenosis presented for planned ICA stenting. During procedure acute developed symptoms concerning for a large vessel infarct involving the right MCA territory. Suspect embolic etiology. CT head imaging reviewed (shot in IR suite) and no signs of ICH or infarct. No contraindications noted so IV tPA given. Patient brought to IR suite for further evaluation and treatment.  1. HgbA1c, fasting lipid panel 2. MRI, MRA  of the brain without contrast 3. Frequent neuro checks 4. Echocardiogram-completed on 5/17 so will hold on repeat echo 5. Prophylactic therapy-Antiplatelet med: hold for 24hrs post tPA 6. Risk factor modification 7. Telemetry monitoring 8. PT consult, OT consult, Speech consult 9. NPO until RN stroke swallow screen  This patient is critically ill and at significant risk of neurological worsening, death and care requires constant monitoring of vital signs, hemodynamics,respiratory and cardiac monitoring,review of multiple databases, neurological assessment, discussion with family, other specialists and medical decision making of high complexity. I spent 55 inutes of neurocritical care time in the care of this patient.   Jim Like, DO [facility] [phone]  If 7pm- 7am, please page neurology on call as listed in [place].

## 2015-03-26 NOTE — Telephone Encounter (Signed)
Sent refill for Atorvastatin 10 mg../lmb

## 2015-03-26 NOTE — Anesthesia Procedure Notes (Signed)
Procedure Name: Intubation Date/Time: 03/09/2015 11:11 AM Performed by: Garrison Columbus T Pre-anesthesia Checklist: Patient identified, Emergency Drugs available, Suction available and Patient being monitored Patient Re-evaluated:Patient Re-evaluated prior to inductionOxygen Delivery Method: Circle system utilized Preoxygenation: Pre-oxygenation with 100% oxygen Intubation Type: IV induction and Rapid sequence Ventilation: Mask ventilation without difficulty Laryngoscope Size: Miller and 2 Grade View: Grade I Tube type: Subglottic suction tube Tube size: 7.5 mm Number of attempts: 1 Airway Equipment and Method: Stylet Placement Confirmation: ETT inserted through vocal cords under direct vision,  positive ETCO2 and breath sounds checked- equal and bilateral Secured at: 23 cm Tube secured with: Tape Dental Injury: Teeth and Oropharynx as per pre-operative assessment

## 2015-03-26 NOTE — Procedures (Signed)
S/P Rt common caritid arteriogram. RT CFA approach. Findings. Complete recanalization of occluded RT MCA with 7mg  of IA tpa and 3 passes with trevoprovue and x1 pass with 4x 40 solitaire FR device with TICI # revascularization

## 2015-03-26 NOTE — Consult Note (Signed)
PULMONARY / CRITICAL CARE MEDICINE   Name: [name] MRN: [id number] DOB: April 17, 1941    ADMISSION DATE:  03/06/2015 CONSULTATION DATE:  5/24  REFERRING MD :  Janann Colonel   CHIEF COMPLAINT:  Vent management   INITIAL PRESENTATION:  [name] is a 74 yo male with multiple medical problems. He presents 5/24 for left ICA stent procedure. During the procedure patient suffered a CT confirmed ischemic stroke with left sided flaccidity and right eye deviation. tPA was administered at 1127 on 03/07/2015 and then taken for additional intervention. Returned to ICU on full vent support. PCCM consulted for vent management.      STUDIES:  5/17: MR/MRA head/brain Wo:  MRI HEAD: No acute intracranial process, specifically no acute ischemia.Involutional changes. Remote LEFT corona radiata lacunar infarct. Moderate to severe white matter changes suggest chronic small vessel ischemic disease.Scattered tiny foci of susceptibility artifact can be seen with chronic hypertension though are nonspecific.  5/17:MRA HEAD: No acute large vessel occlusion or high-grade stenosis. Complete circle of Willis. Mild luminal regularity of the proximal cerebral arteries most consistent with atherosclerosis.  5/16 CT head wo contrast: Generalized atrophy, small vessels chronic ischemic changeds of deep cerebral white matter. Periventricular white matter infarct Left parietal without definite intracranial hemorrhage or evidence of acute infarction.   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 Initially admitted on 5/16 for acute right sided weakness, felt to be TIA and spontaneously resolved and discharged to home. Mr. Spivak presented on 5/24 for for left ICA stent procedure. Post dye injection during the procedure he developed sudden left sided flaccidity and right eye deviation. IV tPA was administered post head CT confirmation of no hemorrhage. He was then take to IR for further intervention.   He had a Grade 1 view during OR and intubated with a 7.5 ETT per anesthesia note.    PAST MEDICAL HISTORY :   has a past medical history of Hypertension; Anxiety; COPD (chronic obstructive pulmonary disease); Hyperlipemia; GERD (gastroesophageal reflux disease); Barrett's esophagus with high grade dysplasia; PVD (peripheral vascular disease); CAD (coronary artery disease); Tobacco user; Depression; Hiatal hernia; Myocardial infarction (12/1996); AAA (abdominal aortic aneurysm); Renal insufficiency (2010); Bruises easily; Pneumonia (06/22/14); Type II diabetes mellitus; Lymph node cancer; Cancer; and History of radiation therapy (09/23/14-11/12/14).  has past surgical history that includes Tonsillectomy; Nissen fundoplication; Abdominal aortic aneurysm repair (2010); Upper gastrointestinal endoscopy (07/07/2011); Colonoscopy (05/07/2005); Radiofrequency ablation (multiple); Colonoscopy (N/A, 03/01/2013); Coronary angioplasty with stent (1998); Parotidectomy w/ neck dissection total (Left, 08/13/2014); Hernia repair; Parotidectomy (Left, 08/13/2014); and Cardiac catheterization (N/A, 03/17/2015). Prior to Admission medications   Medication Sig Start Date End Date Taking? Authorizing Provider  ALPRAZolam Duanne Moron) 0.5 MG tablet TAKE 1 TABLET TWICE A DAY AS NEEDED 11/29/14  Yes Aleksei Plotnikov V, MD  aspirin EC 81 MG tablet Take 81 mg by mouth every morning.    Yes Historical Provider, MD  calcium-vitamin D (OSCAL WITH  D) 500-200 MG-UNIT per tablet Take 1 tablet by mouth 2 (two) times  daily.    Yes Historical Provider, MD  cholecalciferol (VITAMIN D) 1000 UNITS tablet Take 2,000 Units by mouth 2 (two) times daily.    Yes Historical Provider, MD  cloNIDine (CATAPRES) 0.1 MG tablet Take 1 tablet (0.1 mg total) by mouth 2 (two) times daily. 01/17/15  Yes Olga Millers, MD  clopidogrel (PLAVIX) 75 MG tablet Take 1 tablet (75 mg total) by mouth daily. 03/20/15  Yes Domenic Polite, MD  FLUoxetine (PROZAC) 10 MG capsule Take 1 capsule (10 mg total) by mouth daily. 08/30/14  Yes Aleksei Plotnikov V, MD  gabapentin (NEURONTIN) 100 MG capsule Take 1 capsule (100 mg total) by mouth 2 (two) times daily. 05/30/14  Yes Aleksei Plotnikov V, MD  glimepiride (AMARYL) 1 MG tablet Take 1 tablet (1 mg total) by mouth 2 (two) times daily. 06/22/14  Yes Aleksei Plotnikov V, MD  glucose blood (ONE TOUCH TEST STRIPS) test strip Use as instructed 08/08/14  Yes Aleksei Plotnikov V, MD  iron polysaccharides (NIFEREX) 150 MG capsule Take 1 capsule (150 mg total) by mouth daily. 05/10/14  Yes Aleksei Plotnikov V, MD  labetalol (NORMODYNE) 200 MG tablet Take 0.5 tablets (100 mg total) by mouth 3 (three) times daily. Patient taking differently: Take 100 mg by mouth 2 (two) times daily. Take 0.5 tablet (100 mg) in the am & Take 1 tablet (200 mg) at bedtime. 01/10/15  Yes Aleksei Plotnikov V, MD  Magnesium 300 MG CAPS Take 300 mg by mouth daily.   Yes Historical Provider, MD  mirtazapine (REMERON) 15 MG tablet Take 1-2 tablets (15-30 mg total) by mouth at bedtime as needed (for sleep). 01/22/15  Yes Aleksei Plotnikov V, MD  Multiple Vitamin (MULTIVITAMIN WITH MINERALS) TABS tablet Take 1 tablet by mouth every morning.   Yes Historical Provider, MD  omeprazole (PRILOSEC) 40 MG capsule Take 1 capsule (40 mg total) by mouth 2 (two) times daily. 05/23/14  Yes Cassandria Anger, MD  [place]  07/04/14  Yes Historical Provider, MD   sodium bicarbonate 650 MG tablet Take 1,300 mg by mouth every morning.    Yes Historical Provider, MD  temazepam (RESTORIL) 30 MG capsule Take 30 mg by mouth at bedtime.  02/04/15  Yes Historical Provider, MD  Umeclidinium-Vilanterol (ANORO ELLIPTA) 62.5-25 MCG/INH AEPB Inhale 1 puff into the lungs daily.   Yes Historical Provider, MD  atorvastatin (LIPITOR) 10 MG tablet Take 1 tablet (10 mg total) by mouth daily. 03/09/2015   Cassandria Anger, MD   Allergies  Allergen Reactions  . Amlodipine Besylate Swelling  . Benazepril Hcl Other (See Comments)    Elevates potassium levels  . Risperidone Other (See Comments)    loss of motor skills  . Tramadol Nausea And Vomiting  . Valsartan Other (See Comments)    Elevates potassium levels  . Penicillins Itching and Rash    FAMILY HISTORY:  indicated that his mother is deceased. He indicated that his father is deceased. He indicated that his brother is alive.  SOCIAL HISTORY:  reports that he quit smoking about 10 months ago. His smoking use included Cigarettes. He has a 58 pack-year smoking history. He has never used smokeless tobacco. He reports that he does not drink alcohol or use illicit drugs.  REVIEW OF SYSTEMS:  Unable to assess.  VITAL SIGNS: Temp:  [97.6 F (36.4 C)] 97.6 F (36.4 C) (05/24 0544) Pulse Rate:  [59-73] 67 (05/24 1500) Resp:  [18] 18 (05/24 0544) BP: (117-158)/(65-70) 117/65 mmHg (  05/24 1500) SpO2:  [100 %] 100 % (05/24 1500) FiO2 (%):  [100 %] 100 % (05/24 1500) Weight:  [61.236 kg (135 lb)] 61.236 kg (135 lb) (05/24 0544) HEMODYNAMICS:   VENTILATOR SETTINGS: Vent Mode:  [-] PRVC FiO2 (%):  [100 %] 100 % Set Rate:  [14 bmp] 14 bmp Vt Set:  [570 mL] 570 mL PEEP:  [5 cmH20] 5 cmH20 Plateau Pressure:  [12 cmH20] 12 cmH20 INTAKE / OUTPUT:  Intake/Output Summary (Last 24 hours) at 03/28/2015 1622 Last data filed at 03/15/2015 1434  Gross per 24 hour  Intake   1800 ml  Output    355 ml  Net   1445 ml     PHYSICAL EXAMINATION: General:  74 year old male, sedated on vent  Neuro:  Sedated, PERRL. Currently RAS -4 HEENT:  Orally intubated no JVD  Cardiovascular:  rrr Lungs:  Clear  Abdomen:  Soft, + bowel sounds  Musculoskeletal:  Intact, tone not checked  Skin:  Intact   LABS:  CBC  Recent Labs Lab 03/20/15 0537 04/01/2015 0609  WBC 9.1  --   HGB 10.5* 11.9*  HCT 32.2* 35.0*  PLT 210  --    Coag's No results for input(s): APTT, INR in the last 168 hours. BMET  Recent Labs Lab 03/20/15 0537 03/04/2015 0609  NA 141 144  K 3.3* 3.2*  CL 114* 114*  CO2 17*  --   BUN 55* 57*  CREATININE 2.17* 2.40*  GLUCOSE 104* 94   Electrolytes  Recent Labs Lab 03/20/15 0537  CALCIUM 9.8   Sepsis Markers No results for input(s): LATICACIDVEN, PROCALCITON, O2SATVEN in the last 168 hours. ABG No results for input(s): PHART, PCO2ART, PO2ART in the last 168 hours. Liver Enzymes No results for input(s): AST, ALT, ALKPHOS, BILITOT, ALBUMIN in the last 168 hours. Cardiac Enzymes No results for input(s): TROPONINI, PROBNP in the last 168 hours. Glucose  Recent Labs Lab 03/19/15 1654 03/19/15 2126 03/20/15 0624 03/20/15 1113  GLUCAP 158* 126* 118* 117*    Imaging Ct Head Wo Contrast  03/10/2015   CLINICAL DATA:  Stroke. Patient developed acute stroke during left carotid stenting today. Right MCA clot retrieval and tPA today  EXAM: CT HEAD WITHOUT CONTRAST  TECHNIQUE: Contiguous axial images were obtained from the base of the skull through the vertex without intravenous contrast.  COMPARISON:  MRI head 03/19/2015.  CT head 03/18/2015  FINDINGS: Right MCA acute stroke. There is low-density edema involving the right insula, right frontal temporal and parietal lobes. Basal ganglia appears preserved.  There is high density within the right sylvian fissure. This is most likely contrast extravasation from angiography, possibly with associated hemorrhage.  Ventricle size normal. No shift  of the midline structures. Chronic microvascular ischemic change in the white matter. No enhancing mass.  IMPRESSION: Large territory right MCA infarct. High-density within the right sylvian fissure most likely is contrast extravasation possibly with some hemorrhage, post right MCA clot retrieval and tPA.   Electronically Signed   By: Franchot Gallo M.D.   On: 03/17/2015 15:29     ASSESSMENT / PLAN:  PULMONARY 5/24 ETT 7.5 Grade 1 view per anesthesia>>> A: Inability to protect airway acute ischemic stroke COPD followed by [place] >mental status will be major barrier to safe extubation  P:   Cont full vent support, daily assessment for weaning readiness  Wean Fio2 to keep O2 sat >92% CXR in AM F/u abg  VAP protocol    CARDIOVASCULAR  A: HTN  Bradycardia Left radial hematoma P: SBP goals to keep SBP 110-130 (per IR) Will start w/ PRN hydralazine  Pressure dressing to left site w/ CMS checks  RENAL A:  Chronic renal insufficiency: looks like baseline ~ 8.75 NAG metabolic acidosis  Hypokalemia  P: IVF, aim for positive to euvolemic fluid balance Will use NS as MIVF for now. 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    NEUROLOGIC A:   Ischemic stroke post tPA administration and intraarterial tPa w/ re cannulation/retrieveal  device  P:   SBP goal < 110 RASS goal:-1 F/u imaging per neuro, to start antiplatelet x 24 hrs after tPA   FAMILY  - Updates: pending   - Inter-disciplinary family meet or Palliative Care meeting due by: 5/31   TODAY'S SUMMARY: acute right sided  infarct. Returns to ICU on vent s/p intra-arterial clot retrieval/ iatPA. Will cont full vent support, hydrate to ensure stable renal fxn and assess daily for extubation. Rest per stroke team.   74 year old male with acute right sided infarct s/p IR procedure.  Transferred to the ICU intubated post procedure.  PCCM consulted for medical management.  Noted to have a very large hematoma where the arterial line has been.  It is half out.  There are pulses distal to it.  [place] test is positive and cap refill is normal in all 5 fingers.  Will recommend removal of a-line.  RN staff advised to check cap refill every hour on the hour and if delayed (shown how) then to call PCCM then will vascular to intervene.  In the meantime, continue current vent settings.  NO ABGs.  Minimal bleeding noted subglottic suction will monitor for now.  Once sheath is out then will consider waking up and begin weaning.  The patient is critically ill with multiple organ systems failure and requires high complexity decision making for assessment and support, frequent evaluation and titration of therapies, application of advanced monitoring technologies and extensive interpretation of multiple databases.   Critical Care Time devoted to patient care services described in this note is  35  Minutes. This time reflects time of care of this signee Dr Jennet Maduro. This critical care time does not reflect procedure time, or teaching time or supervisory time of PA/NP/Med student/Med Resident etc but could involve care discussion time.  Rush Farmer, M.D. [facility] Pulmonary/Critical Care Medicine. Pager: [phone]. After hours pager: [phone].  03/25/2015, 4:22 PM

## 2015-03-26 NOTE — Anesthesia Preprocedure Evaluation (Addendum)
Anesthesia Evaluation  Patient identified by MRN, date of birth, ID band Patient confused    Reviewed: Allergy & Precautions, H&P , NPO status , Patient's Chart, lab work & pertinent test results, reviewed documented beta blocker date and time   Airway Mallampati: II  TM Distance: >3 FB Neck ROM: Full    Dental  (+) Edentulous Upper, Edentulous Lower, Dental Advisory Given   Pulmonary COPDformer smoker,  breath sounds clear to auscultation        Cardiovascular hypertension, Pt. on medications and Pt. on home beta blockers + CAD, + Past MI, + Cardiac Stents and + Peripheral Vascular Disease Rhythm:Regular Rate:Normal     Neuro/Psych Anxiety Depression TIA   GI/Hepatic GERD-  ,  Endo/Other  diabetes, Type 2, Oral Hypoglycemic Agents  Renal/GU Renal InsufficiencyRenal disease     Musculoskeletal   Abdominal   Peds  Hematology   Anesthesia Other Findings   Reproductive/Obstetrics                            Anesthesia Physical Anesthesia Plan  ASA: III and emergent  Anesthesia Plan: General   Post-op Pain Management:    Induction: Intravenous  Airway Management Planned: Oral ETT  Additional Equipment: Arterial line  Intra-op Plan:   Post-operative Plan: Post-operative intubation/ventilation  Informed Consent: I have reviewed the patients History and Physical, chart, labs and discussed the procedure including the risks, benefits and alternatives for the proposed anesthesia with the patient or authorized representative who has indicated his/her understanding and acceptance.   Dental advisory given  Plan Discussed with: CRNA  Anesthesia Plan Comments:         Anesthesia Quick Evaluation

## 2015-03-26 NOTE — Telephone Encounter (Signed)
10 mg/d Thx

## 2015-03-26 NOTE — Progress Notes (Signed)
    Subjective  -   The patient came in today for stenting of his left carotid stenosis.  He has a history of parotid glad cancer s/p resection and lymph node dissection as well as radiation.  He was admitted last week for dysarthria and right sided weakness.  Due to his parotid gland cancer treatment, I did not think he was a good operative candidate.  He has chronic renal insufficiency, and so the only imaging of his carotid and aortic arch had been an ultrasound which showed >80% left ICA stenosis.  He had not had any events since I last saw him last week.  I performed an aortic arch angiogram which revealed a bovine arch.  We then attempted to canulate his left common carotid artery.  After a few attempts at canulating his left carotid, he was found to be hemiparetic on the left and had a right sided gaze.  A code stroke was called and the stroke team arrived promptly.  He was taken straight to neuro IR where he underwent recanalization of an occluded right MCA with 7mg  of TPA and 3 passes with trevoprovue and x1 pass with 4x 40 solitaire FR device with TICI # revascularization by Dr Estanislado Pandy.  He was transferred to the ICU intubated and sedated.    Physical Exam:  Intubated and sedated       Assessment/Plan:    I spoke with the wife and 2 daughters and updated them on the patient's condition.  All of their questions were answered.  The plan is to keep him sedated on teh vent over night with a MRI in the morning.  Annamarie Major 03/07/2015 4:47 PM --  Danley Danker Vitals:   03/03/2015 1500  BP: 117/65  Pulse: 67  Temp:   Resp:     Intake/Output Summary (Last 24 hours) at 03/31/2015 1647 Last data filed at 03/21/2015 1434  Gross per 24 hour  Intake   1800 ml  Output    355 ml  Net   1445 ml     Laboratory CBC    Component Value Date/Time   WBC 9.1 03/20/2015 0537   HGB 11.9* 04/01/2015 0609   HCT 35.0* 03/17/2015 0609   PLT 210 03/20/2015 0537    BMET    Component Value  Date/Time   NA 144 03/24/2015 0609   K 3.2* 03/05/2015 0609   CL 114* 03/09/2015 0609   CO2 17* 03/20/2015 0537   GLUCOSE 94 03/19/2015 0609   GLUCOSE 131* 10/13/2006 0754   BUN 57* 03/23/2015 0609   CREATININE 2.40* 03/19/2015 0609   CALCIUM 9.8 03/20/2015 0537   CALCIUM 9.6 03/23/2013 0743   GFRNONAA 28* 03/20/2015 0537   GFRAA 33* 03/20/2015 0537    COAG Lab Results  Component Value Date   INR 1.12 03/18/2015   INR 0.99 08/01/2014   INR 1.27 03/15/2013   No results found for: PTT  Antibiotics Anti-infectives    None       V. Leia Alf, M.D. [facility] Office: [phone] Pager:  [phone]

## 2015-03-26 NOTE — Op Note (Signed)
    Patient name: [name] MRN: [id number] DOB: 12-10-1940 Sex: male  03/28/2015 Pre-operative Diagnosis: Symptomatic left carotid stenosis Post-operative diagnosis:  Same Surgeon:  Stark Klein- Physician:  Quay Burow Procedure Performed:  1.  Ultrasound-guided access, right femoral artery  2.  Aortic arch angiogram   Indications:  Last week, the patient presented to the hospital with several episodes of dysarthria as well as an episode of left-sided weakness.  His workup revealed a greater than 80% left carotid stenosis by ultrasound.  He did not have a CT angiogram because of his renal insufficiency.  He was not a good candidate for surgical endarterectomy given his recent history of parotid malignancy which required a radical node dissection on the left as well as 33 cycles of radiation.  He comes in today for treatment and possible stenting  Procedure:  The patient was identified in the holding area and taken to room 8.  The patient was then placed supine on the table and prepped and draped in the usual sterile fashion.  A time out was called.  Ultrasound was used to evaluate the right common femoral artery.  It was patent .  A digital ultrasound image was acquired.  A micropuncture needle was used to access the right common femoral artery under ultrasound guidance.  An 018 wire was advanced without resistance and a micropuncture sheath was placed.  The 018 wire was removed and a benson wire was placed.  The micropuncture sheath was exchanged for a 5 french sheath.  A pigtail was placed into the ascending aorta and an aortic arch and Phillip Heal was performed.   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  No significant ostial stenosis is identified, however there does appear to be a small ulceration in the innominate artery.  The bifurcation of the common carotid artery on the left appears to reveal a high-grade left external carotid stenosis.    Intervention:   Next, we decided to visualization of the bifurcation as on the LAO injection the stenosis did not appear as significant as initially found on ultrasound.  First a Simmons 1 catheter was used, however this did not go into the left carotid but rather into the innominate.  Next, a headhunter catheter was used which also did not get good access to the left carotid.  Finally, a Berenstein 2 catheter was used to select the left common carotid artery.  Once this was selected, before imaging could be obtained the patient was found to have a right lateral gaze.  He was unable to move his left side.  At this point, a code stroke was called.  The catheters were removed.  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  He was taken straight to neuro interventional radiology      V. Annamarie Major, M.D. [facility] Office: [phone] Pager:  [phone]

## 2015-03-27 ENCOUNTER — Inpatient Hospital Stay (HOSPITAL_COMMUNITY): Payer: Medicare Other

## 2015-03-27 ENCOUNTER — Encounter (HOSPITAL_COMMUNITY): Payer: Self-pay | Admitting: Surgery

## 2015-03-27 DIAGNOSIS — I63411 Cerebral infarction due to embolism of right middle cerebral artery: Secondary | ICD-10-CM

## 2015-03-27 DIAGNOSIS — N2589 Other disorders resulting from impaired renal tubular function: Secondary | ICD-10-CM | POA: Insufficient documentation

## 2015-03-27 DIAGNOSIS — J9600 Acute respiratory failure, unspecified whether with hypoxia or hypercapnia: Secondary | ICD-10-CM

## 2015-03-27 DIAGNOSIS — I6789 Other cerebrovascular disease: Secondary | ICD-10-CM

## 2015-03-27 DIAGNOSIS — I63319 Cerebral infarction due to thrombosis of unspecified middle cerebral artery: Secondary | ICD-10-CM

## 2015-03-27 DIAGNOSIS — I10 Essential (primary) hypertension: Secondary | ICD-10-CM

## 2015-03-27 DIAGNOSIS — E1159 Type 2 diabetes mellitus with other circulatory complications: Secondary | ICD-10-CM | POA: Insufficient documentation

## 2015-03-27 DIAGNOSIS — I6522 Occlusion and stenosis of left carotid artery: Secondary | ICD-10-CM

## 2015-03-27 DIAGNOSIS — G936 Cerebral edema: Secondary | ICD-10-CM | POA: Insufficient documentation

## 2015-03-27 DIAGNOSIS — E785 Hyperlipidemia, unspecified: Secondary | ICD-10-CM | POA: Insufficient documentation

## 2015-03-27 DIAGNOSIS — I609 Nontraumatic subarachnoid hemorrhage, unspecified: Secondary | ICD-10-CM | POA: Insufficient documentation

## 2015-03-27 DIAGNOSIS — J9690 Respiratory failure, unspecified, unspecified whether with hypoxia or hypercapnia: Secondary | ICD-10-CM | POA: Insufficient documentation

## 2015-03-27 LAB — BLOOD GAS, ARTERIAL
ACID-BASE DEFICIT: 16.2 mmol/L — AB (ref 0.0–2.0)
Bicarbonate: 9.7 mEq/L — ABNORMAL LOW (ref 20.0–24.0)
FIO2: 0.4 %
MECHVT: 570 mL
O2 Saturation: 99 %
PEEP/CPAP: 5 cmH2O
Patient temperature: 98.6
RATE: 16 resp/min
TCO2: 10.4 mmol/L (ref 0–100)
pCO2 arterial: 22.7 mmHg — ABNORMAL LOW (ref 35.0–45.0)
pH, Arterial: 7.253 — ABNORMAL LOW (ref 7.350–7.450)
pO2, Arterial: 187 mmHg — ABNORMAL HIGH (ref 80.0–100.0)

## 2015-03-27 LAB — TRIGLYCERIDES: Triglycerides: 249 mg/dL — ABNORMAL HIGH (ref <150)

## 2015-03-27 LAB — CBC WITH DIFFERENTIAL/PLATELET
BASOS PCT: 0 % (ref 0–1)
Basophils Absolute: 0 10*3/uL (ref 0.0–0.1)
EOS PCT: 0 % (ref 0–5)
Eosinophils Absolute: 0 10*3/uL (ref 0.0–0.7)
HCT: 25.6 % — ABNORMAL LOW (ref 39.0–52.0)
HEMOGLOBIN: 8.5 g/dL — AB (ref 13.0–17.0)
LYMPHS PCT: 3 % — AB (ref 12–46)
Lymphs Abs: 0.5 10*3/uL — ABNORMAL LOW (ref 0.7–4.0)
MCH: 31 pg (ref 26.0–34.0)
MCHC: 33.2 g/dL (ref 30.0–36.0)
MCV: 93.4 fL (ref 78.0–100.0)
MONOS PCT: 6 % (ref 3–12)
Monocytes Absolute: 1 10*3/uL (ref 0.1–1.0)
NEUTROS ABS: 15 10*3/uL — AB (ref 1.7–7.7)
NEUTROS PCT: 91 % — AB (ref 43–77)
PLATELETS: 201 10*3/uL (ref 150–400)
RBC: 2.74 MIL/uL — ABNORMAL LOW (ref 4.22–5.81)
RDW: 15.5 % (ref 11.5–15.5)
WBC: 16.5 10*3/uL — ABNORMAL HIGH (ref 4.0–10.5)

## 2015-03-27 LAB — BASIC METABOLIC PANEL
Anion gap: 10 (ref 5–15)
BUN: 52 mg/dL — ABNORMAL HIGH (ref 6–20)
CALCIUM: 8.2 mg/dL — AB (ref 8.9–10.3)
CHLORIDE: 120 mmol/L — AB (ref 101–111)
CO2: 10 mmol/L — ABNORMAL LOW (ref 22–32)
Creatinine, Ser: 2.41 mg/dL — ABNORMAL HIGH (ref 0.61–1.24)
GFR calc Af Amer: 29 mL/min — ABNORMAL LOW (ref >60)
GFR, EST NON AFRICAN AMERICAN: 25 mL/min — AB (ref >60)
GLUCOSE: 114 mg/dL — AB (ref 65–99)
Potassium: 4.2 mmol/L (ref 3.5–5.1)
SODIUM: 140 mmol/L (ref 135–145)

## 2015-03-27 LAB — GLUCOSE, CAPILLARY
GLUCOSE-CAPILLARY: 109 mg/dL — AB (ref 65–99)
GLUCOSE-CAPILLARY: 120 mg/dL — AB (ref 65–99)
Glucose-Capillary: 124 mg/dL — ABNORMAL HIGH (ref 65–99)
Glucose-Capillary: 127 mg/dL — ABNORMAL HIGH (ref 65–99)
Glucose-Capillary: 152 mg/dL — ABNORMAL HIGH (ref 65–99)
Glucose-Capillary: 72 mg/dL (ref 65–99)

## 2015-03-27 MED ORDER — SODIUM BICARBONATE 8.4 % IV SOLN
INTRAVENOUS | Status: AC
  Filled 2015-03-27: qty 50

## 2015-03-27 MED ORDER — STERILE WATER FOR INJECTION IV SOLN
INTRAVENOUS | Status: DC
  Administered 2015-03-27 – 2015-03-28 (×3): via INTRAVENOUS
  Filled 2015-03-27 (×5): qty 850

## 2015-03-27 MED ORDER — IPRATROPIUM-ALBUTEROL 0.5-2.5 (3) MG/3ML IN SOLN
3.0000 mL | Freq: Four times a day (QID) | RESPIRATORY_TRACT | Status: DC
  Administered 2015-03-27 – 2015-04-01 (×19): 3 mL via RESPIRATORY_TRACT
  Filled 2015-03-27 (×19): qty 3

## 2015-03-27 MED ORDER — VITAL HIGH PROTEIN PO LIQD
1000.0000 mL | ORAL | Status: DC
  Administered 2015-03-27: 1000 mL
  Filled 2015-03-27 (×4): qty 1000

## 2015-03-27 MED ORDER — SODIUM BICARBONATE 8.4 % IV SOLN
100.0000 meq | Freq: Once | INTRAVENOUS | Status: AC
  Administered 2015-03-27: 100 meq via INTRAVENOUS
  Filled 2015-03-27: qty 50

## 2015-03-27 MED ORDER — ACETAMINOPHEN 325 MG PO TABS
650.0000 mg | ORAL_TABLET | ORAL | Status: DC | PRN
  Administered 2015-03-28 – 2015-04-01 (×5): 650 mg
  Filled 2015-03-27 (×5): qty 2

## 2015-03-27 MED ORDER — PANTOPRAZOLE SODIUM 40 MG PO PACK
40.0000 mg | PACK | Freq: Every day | ORAL | Status: DC
  Administered 2015-03-27 – 2015-03-28 (×2): 40 mg
  Filled 2015-03-27 (×3): qty 20

## 2015-03-27 MED ORDER — ACETAMINOPHEN 650 MG RE SUPP: 650.0000 mg | RECTAL | Status: DC | PRN

## 2015-03-27 NOTE — Progress Notes (Signed)
OT Cancellation Note  Patient Details Name: [name] MRN: [id number] DOB: [birth year]   Cancelled Treatment:    Reason Eval/Treat Not Completed: Patient not medically ready  Darlina Rumpf [place], OTR/L [phone]  03/27/2015, 10:25 AM

## 2015-03-27 NOTE — Progress Notes (Signed)
Referring Physician(s): Xu  Subjective:  CVA while in cath lab 5/24 R MCA ia tpa and clot retrieval On vent No response MRA 5/25: large R MCA territory infarct with hemorrhage- extending to R PCA  Allergies: Amlodipine besylate; Benazepril hcl; Risperidone; Tramadol; Valsartan; and Penicillins  Medications: Prior to Admission medications   Medication Sig Start Date End Date Taking? Authorizing Provider  ALPRAZolam Duanne Moron) 0.5 MG tablet TAKE 1 TABLET TWICE A DAY AS NEEDED 11/29/14  Yes Aleksei Plotnikov V, MD  aspirin EC 81 MG tablet Take 81 mg by mouth every morning.    Yes Historical Provider, MD  calcium-vitamin D (OSCAL WITH D) 500-200 MG-UNIT per tablet Take 1 tablet by mouth 2 (two) times daily.    Yes Historical Provider, MD  cholecalciferol (VITAMIN D) 1000 UNITS tablet Take 2,000 Units by mouth 2 (two) times daily.    Yes Historical Provider, MD  cloNIDine (CATAPRES) 0.1 MG tablet Take 1 tablet (0.1 mg total) by mouth 2 (two) times daily. 01/17/15  Yes Olga Millers, MD  clopidogrel (PLAVIX) 75 MG tablet Take 1 tablet (75 mg total) by mouth daily. 03/20/15  Yes Domenic Polite, MD  FLUoxetine (PROZAC) 10 MG capsule Take 1 capsule (10 mg total) by mouth daily. 08/30/14  Yes Aleksei Plotnikov V, MD  gabapentin (NEURONTIN) 100 MG capsule Take 1 capsule (100 mg total) by mouth 2 (two) times daily. 05/30/14  Yes Aleksei Plotnikov V, MD  glimepiride (AMARYL) 1 MG tablet Take 1 tablet (1 mg total) by mouth 2 (two) times daily. 06/22/14  Yes Aleksei Plotnikov V, MD  glucose blood (ONE TOUCH TEST STRIPS) test strip Use as instructed 08/08/14  Yes Aleksei Plotnikov V, MD  iron polysaccharides (NIFEREX) 150 MG capsule Take 1 capsule (150 mg total) by mouth daily. 05/10/14  Yes Aleksei Plotnikov V, MD  labetalol (NORMODYNE) 200 MG tablet Take 0.5 tablets (100 mg total) by mouth 3 (three) times daily. Patient taking differently: Take 100 mg by mouth 2 (two) times daily. Take 0.5 tablet (100  mg) in the am & Take 1 tablet (200 mg) at bedtime. 01/10/15  Yes Aleksei Plotnikov V, MD  Magnesium 300 MG CAPS Take 300 mg by mouth daily.   Yes Historical Provider, MD  mirtazapine (REMERON) 15 MG tablet Take 1-2 tablets (15-30 mg total) by mouth at bedtime as needed (for sleep). 01/22/15  Yes Aleksei Plotnikov V, MD  Multiple Vitamin (MULTIVITAMIN WITH MINERALS) TABS tablet Take 1 tablet by mouth every morning.   Yes Historical Provider, MD  omeprazole (PRILOSEC) 40 MG capsule Take 1 capsule (40 mg total) by mouth 2 (two) times daily. 05/23/14  Yes Cassandria Anger, MD  [place]  07/04/14  Yes Historical Provider, MD  sodium bicarbonate 650 MG tablet Take 1,300 mg by mouth every morning.    Yes Historical Provider, MD  temazepam (RESTORIL) 30 MG capsule Take 30 mg by mouth at bedtime.  02/04/15  Yes Historical Provider, MD  Umeclidinium-Vilanterol (ANORO ELLIPTA) 62.5-25 MCG/INH AEPB Inhale 1 puff into the lungs daily.   Yes Historical Provider, MD  atorvastatin (LIPITOR) 10 MG tablet Take 1 tablet (10 mg total) by mouth daily. 04/01/2015   Aleksei Plotnikov V, MD     Vital Signs: BP 164/72 mmHg  Pulse 87  Temp(Src) 98.9 F (37.2 C) (Axillary)  Resp 12  Ht 5\' 9"  (1.753 m)  Wt 64.9 kg (143 lb 1.3 oz)  BMI 21.12 kg/m2  SpO2 100%  Physical  Exam  Cardiovascular: Normal rate.   Pulmonary/Chest:  On vent  Musculoskeletal:  Seems to withdraw to pain on Left  Neurological:  No response    Imaging: Ct Head Wo Contrast  03/27/2015   CLINICAL DATA:  Twenty priors status post tPA therapy.  EXAM: CT HEAD WITHOUT CONTRAST  TECHNIQUE: Contiguous axial images were obtained from the base of the skull through the vertex without intravenous contrast.  COMPARISON:  03/25/2015 and MR brain 03/19/2015.  FINDINGS: Low-attenuation in the right middle cerebral artery territory is again seen with sulcal effacement throughout the right cerebral hemisphere. Associated areas of  subarachnoid high attenuation persist but have improved somewhat in the interval. 1-2 mm of right to left midline shift. High attenuation is again seen along the tentorium as well. Periventricular low attenuation. No hydrocephalus or mass lesion.  IMPRESSION: 1. Expected evolutionary changes of acute right middle cerebral artery infarction with associated edema and 1-2 mm right to left midline shift. 2. Subarachnoid high attenuation has improved somewhat in the interval and may represent a combination of post tPA hemorrhage and residual extravasated IV contrast. 3. Mild chronic microvascular white matter ischemic changes.   Electronically Signed   By: Lorin Picket M.D.   On: 03/27/2015 10:22   Ct Head Wo Contrast  03/20/2015   CLINICAL DATA:  Stroke. Patient developed acute stroke during left carotid stenting today. Right MCA clot retrieval and tPA today  EXAM: CT HEAD WITHOUT CONTRAST  TECHNIQUE: Contiguous axial images were obtained from the base of the skull through the vertex without intravenous contrast.  COMPARISON:  MRI head 03/19/2015.  CT head 03/18/2015  FINDINGS: Right MCA acute stroke. There is low-density edema involving the right insula, right frontal temporal and parietal lobes. Basal ganglia appears preserved.  There is high density within the right sylvian fissure. This is most likely contrast extravasation from angiography, possibly with associated hemorrhage.  Ventricle size normal. No shift of the midline structures. Chronic microvascular ischemic change in the white matter. No enhancing mass.  IMPRESSION: Large territory right MCA infarct. High-density within the right sylvian fissure most likely is contrast extravasation possibly with some hemorrhage, post right MCA clot retrieval and tPA.   Electronically Signed   By: Franchot Gallo M.D.   On: 03/25/2015 15:29   Mr Brain Wo Contrast  03/27/2015   CLINICAL DATA:  . Attempted left carotid stenting 03/06/2015. Developed right MCA stroke  during procedure. Right MCA clot retrieval and tPA administration yesterday.  EXAM: MRI HEAD WITHOUT CONTRAST  MRA HEAD WITHOUT CONTRAST  TECHNIQUE: Multiplanar, multiecho pulse sequences of the brain and surrounding structures were obtained without intravenous contrast. Angiographic images of the head were obtained using MRA technique without contrast.  COMPARISON:  CT head 04/01/2015  FINDINGS: MRI HEAD FINDINGS  Acute infarct right MCA territory. This involves most of the right MCA territory including right frontal, temporal, and parietal lobes with extension into the right occipital lobe. Note is made of a patent right posterior communicating artery allowing for right occipital infarction. Acute infarct in the right lateral putamen and right tail of caudate. Small areas of acute infarct in the superior new cerebellum bilaterally and in the left occipital lobe. Small acute infarct left pons.  Mild mass-effect related to cerebral edema. Minimal shift of the midline structures measuring 1 mm. Mild hemorrhage in the right frontal lobe acute infarct. Scattered areas of chronic micro hemorrhage are present bilaterally likely related to chronic hypertension.  Chronic infarct in the centrum semiovale on  the left. Chronic microvascular ischemic changes in the white matter. Mild chronic ischemia in the pons. No underlying mass lesion. Ventricle size is normal.  MRA HEAD FINDINGS  Right vertebral dominant. Right vertebral widely patent to the basilar. Left vertebral artery is hypoplastic after the PICA. Right PICA not visualized. Large right AICA patent. Basilar patent. Superior cerebellar and posterior cerebral arteries patent bilaterally. Fetal origin of the posterior cerebral artery bilaterally which is patent bilaterally.  Internal carotid artery widely patent bilaterally. Anterior and middle cerebral arteries widely patent. No branch occlusion or stenosis. Right M1 segment appears normal post right MCA clot retrieval   Negative for cerebral aneurysm.  IMPRESSION: Large territory acute right MCA infarct with mild hemorrhage in the right frontal infarct. Acute infarct also extends into the right PCA territory related to fetal origin of the right posterior cerebral artery. Smaller acute infarcts also noted in the cerebellum bilaterally, left occipital lobe, and left anterior pons.  Mild chronic microvascular ischemia. Mild chronic micro hemorrhage in the brain related to chronic hypertension  MRA and circle Willis is normal. Right MCA appears normal. Fetal origin of the posterior cerebral artery bilaterally.   Electronically Signed   By: Franchot Gallo M.D.   On: 03/27/2015 11:05   Portable Chest Xray  03/27/2015   CLINICAL DATA:  Intubation.  EXAM: PORTABLE CHEST - 1 VIEW  COMPARISON:  03/30/2015  FINDINGS: Endotracheal tube has tip 5.9 cm above the carina. Lungs are adequately inflated without focal consolidation or effusion. Cardiomediastinal silhouette and remainder of the exam is unchanged.  IMPRESSION: No active disease.  Endotracheal tube with tip 5.9 cm above the carina.   Electronically Signed   By: Marin Olp M.D.   On: 03/27/2015 07:57   Dg Chest Port 1 View  03/18/2015   CLINICAL DATA:  Evaluate endotracheal tube.  EXAM: PORTABLE CHEST - 1 VIEW  COMPARISON:  Chest radiograph 06/22/2014  FINDINGS: Monitoring leads overlie the patient. ET tube terminates in the mid trachea. Stable cardiac and mediastinal contours with tortuosity of the thoracic aorta. No large consolidative pulmonary opacities. Focal consolidative opacity within the right lower mid lung. Probable nipple shadow left lower mid lung. No pleural effusion or pneumothorax. Regional skeleton is unremarkable.  IMPRESSION: ET tube terminates in the mid trachea. More focal opacity within the right lower mid lung may represent an area of atelectasis or infection. Short-term radiographic followup is recommended to assess for interval resolution.    Electronically Signed   By: Lovey Newcomer M.D.   On: 03/05/2015 17:09   Mr Jodene Nam Head/brain Wo Cm  03/27/2015   CLINICAL DATA:  . Attempted left carotid stenting 03/06/2015. Developed right MCA stroke during procedure. Right MCA clot retrieval and tPA administration yesterday.  EXAM: MRI HEAD WITHOUT CONTRAST  MRA HEAD WITHOUT CONTRAST  TECHNIQUE: Multiplanar, multiecho pulse sequences of the brain and surrounding structures were obtained without intravenous contrast. Angiographic images of the head were obtained using MRA technique without contrast.  COMPARISON:  CT head 03/16/2015  FINDINGS: MRI HEAD FINDINGS  Acute infarct right MCA territory. This involves most of the right MCA territory including right frontal, temporal, and parietal lobes with extension into the right occipital lobe. Note is made of a patent right posterior communicating artery allowing for right occipital infarction. Acute infarct in the right lateral putamen and right tail of caudate. Small areas of acute infarct in the superior new cerebellum bilaterally and in the left occipital lobe. Small acute infarct left pons.  Mild  mass-effect related to cerebral edema. Minimal shift of the midline structures measuring 1 mm. Mild hemorrhage in the right frontal lobe acute infarct. Scattered areas of chronic micro hemorrhage are present bilaterally likely related to chronic hypertension.  Chronic infarct in the centrum semiovale on the left. Chronic microvascular ischemic changes in the white matter. Mild chronic ischemia in the pons. No underlying mass lesion. Ventricle size is normal.  MRA HEAD FINDINGS  Right vertebral dominant. Right vertebral widely patent to the basilar. Left vertebral artery is hypoplastic after the PICA. Right PICA not visualized. Large right AICA patent. Basilar patent. Superior cerebellar and posterior cerebral arteries patent bilaterally. Fetal origin of the posterior cerebral artery bilaterally which is patent bilaterally.   Internal carotid artery widely patent bilaterally. Anterior and middle cerebral arteries widely patent. No branch occlusion or stenosis. Right M1 segment appears normal post right MCA clot retrieval  Negative for cerebral aneurysm.  IMPRESSION: Large territory acute right MCA infarct with mild hemorrhage in the right frontal infarct. Acute infarct also extends into the right PCA territory related to fetal origin of the right posterior cerebral artery. Smaller acute infarcts also noted in the cerebellum bilaterally, left occipital lobe, and left anterior pons.  Mild chronic microvascular ischemia. Mild chronic micro hemorrhage in the brain related to chronic hypertension  MRA and circle Willis is normal. Right MCA appears normal. Fetal origin of the posterior cerebral artery bilaterally.   Electronically Signed   By: Franchot Gallo M.D.   On: 03/27/2015 11:05    Labs:  CBC:  Recent Labs  03/19/15 0340 03/20/15 0537 03/27/2015 0609 03/20/2015 2116 03/27/15 0546  WBC 9.5 9.1  --  14.3* 16.5*  HGB 10.3* 10.5* 11.9* 8.7* 8.5*  HCT 31.8* 32.2* 35.0* 25.8* 25.6*  PLT 204 210  --  195 201    COAGS:  Recent Labs  08/01/14 0750 03/18/15 2124  INR 0.99 1.12  APTT 34 33    BMP:  Recent Labs  01/10/15 1030 03/18/15 2124 03/18/15 2133 03/19/15 0340 03/20/15 0537 03/04/2015 0609 03/27/15 0546  NA 139 137 142  --  141 144 140  K 3.3* 2.9* 3.0*  --  3.3* 3.2* 4.2  CL 111 112* 111  --  114* 114* 120*  CO2 20 16*  --   --  17*  --  10*  GLUCOSE 89 71 66  --  104* 94 114*  BUN 42* 63* 58*  --  55* 57* 52*  CALCIUM 9.4 9.0  --   --  9.8  --  8.2*  CREATININE 2.11* 2.42* 2.70* 2.53* 2.17* 2.40* 2.41*  GFRNONAA  --  25*  --  24* 28*  --  25*  GFRAA  --  29*  --  27* 33*  --  29*    LIVER FUNCTION TESTS:  Recent Labs  05/02/14 0425 05/03/14 0424 08/09/14 1135 03/18/15 2124  BILITOT 0.2* 0.2* <0.2* 0.5  AST 212* 144* 31 24  ALT 242* 187* 47 32  ALKPHOS 649* 554* 102 115  PROT 6.8  7.0 7.6 8.0  ALBUMIN 1.9* 2.0* 3.3* 4.2    Assessment and Plan:  CVA 5/24 R MCA ia tpa/clot retrieval On vent- no response  Signed: Lucendia Leard A 03/27/2015, 12:42 PM   I spent a total of 15 Minutes in face to face in clinical consultation/evaluation, greater than 50% of which was counseling/coordinating care for CVA/ clot retrieval

## 2015-03-27 NOTE — Progress Notes (Signed)
eLink Physician-Brief Progress Note Patient Name: [name] DOB: 08/08/1941 MRN: [id number]   Date of Service  03/27/2015  HPI/Events of Note  ABG on 40%/PRVC 16/TV 520/P 5 = 7.25/22.7/187. He is over breathing the set ventilator rate at 20 to compensate for his metabolic acidosis. ABG c/w mixed metabolic acidosis (Non Gap - DDx Diarrhea vs RTA) and respiratory acidosis (inadequate compensation).   eICU Interventions  Will give NaHCO3 100 meq IV now and re-check ABG at 8 AM.     Intervention Category Major Interventions: Acid-Base disturbance - evaluation and management  Sommer,Steven Eugene 03/27/2015, 6:16 AM

## 2015-03-27 NOTE — Progress Notes (Signed)
Bedside EEG completed, results pending. 

## 2015-03-27 NOTE — Progress Notes (Signed)
PULMONARY / CRITICAL CARE MEDICINE   Name: [name] MRN: [id number] DOB: October 11, 1941    ADMISSION DATE:  03/30/2015 CONSULTATION DATE:  5/24  REFERRING MD :  Janann Colonel   CHIEF COMPLAINT:  Vent management   INITIAL PRESENTATION:  [name] is a 74 yo male with multiple medical problems. He presents 5/24 for left ICA stent procedure. During the procedure patient suffered a CT confirmed ischemic stroke with left sided flaccidity and right eye deviation. tPA was administered at 1127 on 03/10/2015 and then taken for additional intervention. Returned to ICU on full vent support. PCCM consulted for vent management.      SIGNIFICANT EVENTS/STUDIES:  5/17: MRI/MRA brain: No acute intracranial process, specifically no acute  ischemia.Involutional changes. Remote LEFT corona radiata lacunar infarct. Moderate to severe white matter changes suggest chronic small vessel ischemic disease.Scattered tiny foci of susceptibility artifact can be seen with chronic hypertension though are nonspecific. Mild luminal regularity of the proximal cerebral arteries most consistent with atherosclerosis. 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 Procedure complicated by acute R carotid occlusion 5/24 Neuro IR procedure: tPA and mechanical clot removal from R MCA with complete recanalization. Left intubated post procedure 5/24 CT head: Large territory right MCA infarct. High-density within the right sylvian fissure most likely is contrast extravasation possibly with some hemorrhage, post right MCA clot retrieval and tPA  5/25 MRI brain: Large territory acute right MCA infarct with mild hemorrhage in the right frontal infarct. Acute infarct also extends into the right PCA territory related to fetal origin of the right posterior cerebral artery. Smaller acute infarcts also noted in the cerebellum bilaterally, left occipital lobe, and left anterior  pons    INDWELLING DEVICES:: ETT 5/24 >>   MICRO DATA:   ANTIMICROBIALS:    SUBJECTIVE: RASS -3, not F/C. No formal WUA or SBT performed  VITAL SIGNS: Temp:  [93.7 F (34.3 C)-99.7 F (37.6 C)] 98.9 F (37.2 C) (05/25 0747) Pulse Rate:  [58-126] 88 (05/25 1100) Resp:  [11-25] 15 (05/25 1100) BP: (107-177)/(57-85) 123/73 mmHg (05/25 1100) SpO2:  [83 %-100 %] 100 % (05/25 1100) Arterial Line BP: (119-179)/(57-87) 157/67 mmHg (05/25 0800) FiO2 (%):  [40 %-100 %] 40 % (05/25 0825) Weight:  [64.9 kg (143 lb 1.3 oz)] 64.9 kg (143 lb 1.3 oz) (05/24 1745) HEMODYNAMICS:   VENTILATOR SETTINGS: Vent Mode:  [-] PRVC FiO2 (%):  [40 %-100 %] 40 % Set Rate:  [14 bmp-16 bmp] 16 bmp Vt Set:  [570 mL] 570 mL PEEP:  [5 cmH20] 5 cmH20 Plateau Pressure:  [12 cmH20-19 cmH20] 13 cmH20 INTAKE / OUTPUT:  Intake/Output Summary (Last 24 hours) at 03/27/15 1224 Last data filed at 03/27/15 1100  Gross per 24 hour  Intake   3731 ml  Output   1310 ml  Net   2421 ml    PHYSICAL EXAMINATION: General: RASS -3, not F/C Neuro: L hemiplegia HEENT: NCAT  Cardiovascular: Reg, no M Lungs:  Clear  Abdomen:  Soft, + bowel sounds  EXT: Warm, no edema Skin:  Intact   LABS:  CBC  Recent Labs Lab 03/19/2015 0609 03/20/2015 2116 03/27/15 0546  WBC  --  14.3* 16.5*  HGB 11.9* 8.7* 8.5*  HCT 35.0* 25.8* 25.6*  PLT  --  195 201   Coag's No results for input(s): APTT, INR in the last 168 hours. BMET  Recent Labs Lab 03/23/2015 [phone] 03/27/15 [phone]  NA 144 140  K 3.2* 4.2  CL 114* 120*  CO2  --  10*  BUN 57* 52*  CREATININE 2.40* 2.41*  GLUCOSE 94 114*   Electrolytes  Recent Labs Lab 03/27/15 0546  CALCIUM 8.2*   Sepsis Markers No results for input(s): LATICACIDVEN, PROCALCITON, O2SATVEN in the last 168 hours. ABG  Recent Labs Lab 03/27/2015 1703 03/23/2015 2145 03/27/15 0548  PHART 7.184* 7.263* 7.253*  PCO2ART 32.9* 27.5* 22.7*  PO2ART 543* 245* 187*   Liver Enzymes No  results for input(s): AST, ALT, ALKPHOS, BILITOT, ALBUMIN in the last 168 hours. Cardiac Enzymes No results for input(s): TROPONINI, PROBNP in the last 168 hours. Glucose  Recent Labs Lab 03/29/2015 1751 03/11/2015 1957 03/19/2015 2346 03/27/15 0337 03/27/15 0749  GLUCAP 155* 128* 72 109* 127*    CXR: NACPD    ASSESSMENT / PLAN:  PULMONARY A: VDRF - intubated for nuero IR procedure COPD without wheezing P:   Cont full vent support - settings reviewed and/or adjusted Cont vent bundle Daily SBT if/when meets criteria Nebulized BDs 5/25  CARDIOVASCULAR A: HTN Bradycardia, resolved P: SBP goals per Stroke team Cont PRN hydralazine   RENAL A:  CKD, baseline Cr 2.07 AKI H/O renal tubular acidosis  Hypokalemia, resolved P: Monitor BMET intermittently Monitor I/Os Correct electrolytes as indicated Begin HCO3 infusion 5/25  GASTROINTESTINAL A:  NO issues P:   SUP: enteral PPI  Initiate TFs 5/25  HEMATOLOGIC A:   Anemia of chronic disease  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    NEUROLOGIC A:  Large R MCA teritory CVA Status post re-canalization 5/24 P:   RASS goal:-1 Mgmt per Stroke team   CCM X 30 mins  Merton Border, MD ; [facility] [phone].  After 5:30 PM or weekends, call [phone]

## 2015-03-27 NOTE — Progress Notes (Signed)
69F sheath removed v-pad applied no complications reviewed with [facility] RN pressure dressing and sand bag applied.

## 2015-03-27 NOTE — Progress Notes (Signed)
  Echocardiogram 2D Echocardiogram has been performed.  Darlina Sicilian M 03/27/2015, 1:33 PM

## 2015-03-27 NOTE — Progress Notes (Signed)
PT Cancellation Note  Patient Details Name: [name] MRN: [id number] DOB: 1941-04-06   Cancelled Treatment:    Reason Eval/Treat Not Completed: Medical issues which prohibited therapy.  Newly vented and sedated. 03/27/2015  Donnella Sham, [place] [phone]  (pager)   [name], Tessie Fass 03/27/2015, 11:16 AM

## 2015-03-27 NOTE — Care Management Note (Signed)
Case Management Note  Patient Details  Name: [name] MRN: [id number] Date of Birth: 28-Apr-1941  Subjective/Objective:       Pt admitted on 03/16/2015 with CODE stroke.  PTA, pt resided at home with spouse.              Action/Plan: Will follow for discharge planning as pt progresses.    Expected Discharge Date:                  Expected Discharge Plan:  [place]  In-House Referral:     Discharge planning Services  CM Consult  Post Acute Care Choice:    Choice offered to:     DME Arranged:    DME Agency:     HH Arranged:    [place] Agency:     Status of Service:  In process, will continue to follow  Medicare Important Message Given:    Date Medicare IM Given:    Medicare IM give by:    Date Additional Medicare IM Given:    Additional Medicare Important Message give by:     If discussed at [place] of Stay Meetings, dates discussed:    Additional Comments:  Reinaldo Raddle, RN, BSN  Trauma/Neuro ICU Case Manager [phone]

## 2015-03-27 NOTE — Progress Notes (Signed)
SLP Cancellation Note  Patient Details Name: [name] MRN: [id number] DOB: 1941-06-17   Cancelled treatment:       Reason Eval/Treat Not Completed: Patient not medically ready.    Walker Paddack, Katherene Ponto 03/27/2015, 7:27 AM

## 2015-03-27 NOTE — Procedures (Signed)
ELECTROENCEPHALOGRAM REPORT   Patient: [name]       Room #: 1R83 EEG No. ID: [id number] Age: 74 y.o.        Sex: male Referring Physician: Erlinda Hong Report Date:  03/27/2015        Interpreting Physician: Alexis Goodell  History: [name] is an 74 y.o. male who developed an ischemic infarct during procedure to place left ICA stent.  Now exhibiting focal tremors.    Medications:  Scheduled: . antiseptic oral rinse  7 mL Mouth Rinse QID  . atorvastatin  10 mg Per Tube Daily  . chlorhexidine  15 mL Mouth Rinse BID  . feeding supplement (VITAL HIGH PROTEIN)  1,000 mL Per Tube Q24H  . insulin aspart  0-15 Units Subcutaneous 6 times per day  . ipratropium-albuterol  3 mL Nebulization Q6H  . pantoprazole sodium  40 mg Per Tube Q1200    Conditions of Recording:  This is a 16 channel EEG carried out with the patient in the intubated and sedated state.  Description:  Artifact is prominent during the recording. When the background is able to be evaluated it consists of a low voltage, poorly organized mixture of theta and delta activity.  Propofol was administered during the recording.  After administration there less artifact.  Delta activity becomes more prominent after the administration of the Propofol and some centrally distributed spindle activity is noted as well.    The patient does exhibit some tremor activity clinically during the recording.  Only artifact can be appreciated during these episodes.  Hyperventilation and intermittent photic stimulation were not performed.  IMPRESSION: This is an abnormal EEG secondary to general background slowing.  This finding may be seen with a diffuse disturbance that is etiologically nonspecific, but can also be seen with a medication effect and is consistent with the patient's current medications.  There are periods of tremor captured during the recording but only artifact can be appreciated during these episodes.  No distinct epileptiform  activity is noted.     Alexis Goodell, MD [facility] [phone] 03/27/2015, 8:35 PM

## 2015-03-27 NOTE — Progress Notes (Signed)
STROKE TEAM PROGRESS NOTE   SUBJECTIVE (INTERVAL HISTORY) His RN is at the bedside.  Overall he feels his condition is unchanged. He still intubated and not respond well even off propofol for 43min. He is off to MRI and CT.    OBJECTIVE Temp:  [93.7 F (34.3 C)-99.7 F (37.6 C)] 98.9 F (37.2 C) (05/25 0747) Pulse Rate:  [58-126] 87 (05/25 0800) Cardiac Rhythm:  [-] Normal sinus rhythm (05/25 0400) Resp:  [11-25] 15 (05/25 0800) BP: (107-177)/(57-84) 148/71 mmHg (05/25 0800) SpO2:  [83 %-100 %] 100 % (05/25 0800) Arterial Line BP: (119-179)/(57-87) 157/67 mmHg (05/25 0800) FiO2 (%):  [40 %-100 %] 40 % (05/25 0455) Weight:  [143 lb 1.3 oz (64.9 kg)] 143 lb 1.3 oz (64.9 kg) (05/24 1745)   Recent Labs Lab 03/09/2015 1751 03/19/2015 1957 04/01/2015 2346 03/27/15 0337 03/27/15 0749  GLUCAP 155* 128* 72 109* 127*    Recent Labs Lab 03/23/2015 0609 03/27/15 0546  NA 144 140  K 3.2* 4.2  CL 114* 120*  CO2  --  10*  GLUCOSE 94 114*  BUN 57* 52*  CREATININE 2.40* 2.41*  CALCIUM  --  8.2*   No results for input(s): AST, ALT, ALKPHOS, BILITOT, PROT, ALBUMIN in the last 168 hours.  Recent Labs Lab 03/21/2015 0609 03/30/2015 2116 03/27/15 0546  WBC  --  14.3* 16.5*  NEUTROABS  --   --  15.0*  HGB 11.9* 8.7* 8.5*  HCT 35.0* 25.8* 25.6*  MCV  --  92.5 93.4  PLT  --  195 201   No results for input(s): CKTOTAL, CKMB, CKMBINDEX, TROPONINI in the last 168 hours. No results for input(s): LABPROT, INR in the last 72 hours. No results for input(s): COLORURINE, LABSPEC, Hughes, GLUCOSEU, HGBUR, BILIRUBINUR, KETONESUR, PROTEINUR, UROBILINOGEN, NITRITE, LEUKOCYTESUR in the last 72 hours.  Invalid input(s): APPERANCEUR     Component Value Date/Time   CHOL 113 03/19/2015 0340   TRIG 249* 03/27/2015 0546   TRIG 135 10/13/2006 0754   HDL 24* 03/19/2015 0340   CHOLHDL 4.7 03/19/2015 0340   CHOLHDL 4.6 CALC 10/13/2006 0754   VLDL 49* 03/19/2015 0340   LDLCALC 40 03/19/2015 0340   Lab  Results  Component Value Date   HGBA1C 5.3 03/19/2015      Component Value Date/Time   LABOPIA NONE DETECTED 03/19/2015 0329   COCAINSCRNUR NONE DETECTED 03/19/2015 0329   LABBENZ POSITIVE* 03/19/2015 0329   AMPHETMU NONE DETECTED 03/19/2015 0329   THCU NONE DETECTED 03/19/2015 0329   LABBARB NONE DETECTED 03/19/2015 0329    No results for input(s): ETH in the last 168 hours.  I have personally reviewed the radiological images below and agree with the radiology interpretations.  Ct Head Wo Contrast  03/18/2015   IMPRESSION: Large territory right MCA infarct. High-density within the right sylvian fissure most likely is contrast extravasation possibly with some hemorrhage, post right MCA clot retrieval and tPA.   Ct Head Wo Contrast  03/18/2015   IMPRESSION: Atrophy with small vessel chronic ischemic changes of deep cerebral white matter.  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  No acute intracranial abnormalities.   Mri and Mra Brain Without Contrast  03/19/2015    IMPRESSION: MRI HEAD: No acute intracranial process, specifically no acute ischemia.  Involutional changes. Remote LEFT corona radiata lacunar infarct. Moderate to severe white matter changes suggest chronic small vessel ischemic disease.  Scattered tiny foci of susceptibility artifact can be seen with chronic hypertension though are nonspecific.  MRA HEAD: No acute large vessel occlusion or high-grade stenosis. Complete circle of Willis.  Mild luminal regularity of the proximal cerebral arteries most consistent with atherosclerosis.    Portable Chest Xray  03/27/2015    IMPRESSION: No active disease.  Endotracheal tube with tip 5.9 cm above the carina.    2D Echocardiogram  pending  PHYSICAL EXAM  Temp:  [93.7 F (34.3 C)-99.7 F (37.6 C)] 98.9 F (37.2 C) (05/25 0747) Pulse Rate:  [58-126] 87 (05/25 0800) Resp:  [11-25] 15 (05/25 0800) BP: (107-177)/(57-84) 148/71 mmHg (05/25 0800) SpO2:  [83  %-100 %] 100 % (05/25 0800) Arterial Line BP: (119-179)/(57-87) 157/67 mmHg (05/25 0800) FiO2 (%):  [40 %-100 %] 40 % (05/25 0455) Weight:  [143 lb 1.3 oz (64.9 kg)] 143 lb 1.3 oz (64.9 kg) (05/24 1745)  General - Well nourished, well developed, intubated and sedated.  Ophthalmologic - fundi not visualized due to eye movement.  Cardiovascular - Regular rate and rhythm.  Neuro - intubated and sedated, eyes closed, doll's eyes present, pupil equal reactive to light, left corneal reflex absent, right corneal reflex present, weak cough reflex present. Breathing over the vent, on pain stimulation, right upper extremity localizing to pain, left upper extremity 0/5, bilateral lower extremity mild withdrawal. Reflex 1+, left Babinski positive. Sensation, coordination and gait not tested.  ASSESSMENT/PLAN [name] is a 74 y.o. male with history of hypertension, hyperlipidemia, diabetes mellitus, COPD, coronary artery disease, peripheral vascular disease and TIA due to left ICA high grade stenosis admitted for right MCA stroke during left CEA procedure. Patient was intubated, as status post IV TPA and right MCA mechanical thrombectomy with re-cannulization. However, MRI showed right complete MCA infarct.    Stroke:  Non-dominant right large MCA malignant infarct s/p IV tPA and mechanical thrombectomy with recannulization, embolic secondary to procedure related.   MRI right large MCA malignant infarct  MRA  Right MCA patent with right hemisphere luxury perfusion  Carotid Doppler 03/19/15 - left ICA > 80% stenosis  2D Echo  pending  LDL 40  HgbA1c 5.3  SCDs for VTE prophylaxis  Diet NPO time specified   aspirin 81 mg orally every day and clopidogrel 75 mg orally every day prior to admission, now on no antithrombotic due to [facility] post procedure.  Discussed with wife, daughter, and son regarding patient diagnosis, treatment plan and likely prognosis. Outcome guarded at this time.   SAH  post procedure  CT showed right sylvian fissure SAH  Repeat CT showed improvement of SAH  Likely related to thrombectomy  Hold off antiplatelets for now  Cerebral edema  Mild midline shift  Repeat CT in am  May consider 3% saline depending on CT result  Not a candidate for hemicraniectomy based on age and medical condition  Discussed with wife and family and they understand  Carotid stenosis  Left ICA stenosis > 80% at bulb  Avoid hypotension  BP goal 130-180   Hypertension  Home meds: Clonidine, labetalol  Currently no BP meds  Avoid hypotension  BP goal 130-180   Hyperlipidemia  Home meds: lipitor 10, resumed in hospital  LDL 40, goal < 70  Continue statin at discharge  DM  A1C 5.5  Under control  Other Stroke Risk Factors  Advanced age  Cigarette smoker, quit smoking 10 months ago , re-advised to stop smoking  Coronary artery disease - MI, PCI s/ stent 1998  PVD  Other Active Problems  Anxiety  COPD w/ emphysema  GERD  Barrett's esophagus  Depression  AAA repair 2010   CKD stage 3  hypokalemia  Other Pertinent History  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 This patient's care requires constant monitoring of vital signs, hemodynamics, respiratory and cardiac monitoring, review of multiple databases, neurological assessment, discussion with family, other specialists and medical decision making of high complexity. I spent 55 minutes of neurocritical care time in the care of this patient.   Rosalin Hawking, MD PhD Stroke Neurology 03/27/2015 9:33 AM    To contact Stroke Continuity provider, please refer to  [email or web address] After hours, contact General Neurology

## 2015-03-28 ENCOUNTER — Encounter (HOSPITAL_COMMUNITY): Payer: Self-pay | Admitting: Radiology

## 2015-03-28 ENCOUNTER — Inpatient Hospital Stay (HOSPITAL_COMMUNITY): Payer: Medicare Other

## 2015-03-28 DIAGNOSIS — G936 Cerebral edema: Secondary | ICD-10-CM

## 2015-03-28 LAB — BASIC METABOLIC PANEL
Anion gap: 10 (ref 5–15)
BUN: 55 mg/dL — ABNORMAL HIGH (ref 6–20)
CO2: 22 mmol/L (ref 22–32)
Calcium: 8.1 mg/dL — ABNORMAL LOW (ref 8.9–10.3)
Chloride: 114 mmol/L — ABNORMAL HIGH (ref 101–111)
Creatinine, Ser: 2.4 mg/dL — ABNORMAL HIGH (ref 0.61–1.24)
GFR, EST AFRICAN AMERICAN: 29 mL/min — AB (ref >60)
GFR, EST NON AFRICAN AMERICAN: 25 mL/min — AB (ref >60)
GLUCOSE: 143 mg/dL — AB (ref 65–99)
Potassium: 3.1 mmol/L — ABNORMAL LOW (ref 3.5–5.1)
SODIUM: 146 mmol/L — AB (ref 135–145)

## 2015-03-28 LAB — URINALYSIS W MICROSCOPIC (NOT AT ARMC)
Bilirubin Urine: NEGATIVE
GLUCOSE, UA: NEGATIVE mg/dL
HGB URINE DIPSTICK: NEGATIVE
KETONES UR: NEGATIVE mg/dL
Leukocytes, UA: NEGATIVE
Nitrite: NEGATIVE
Protein, ur: 30 mg/dL — AB
Specific Gravity, Urine: 1.02 (ref 1.005–1.030)
Urobilinogen, UA: 0.2 mg/dL (ref 0.0–1.0)
pH: 5 (ref 5.0–8.0)

## 2015-03-28 LAB — CBC
HEMATOCRIT: 21.7 % — AB (ref 39.0–52.0)
Hemoglobin: 7.3 g/dL — ABNORMAL LOW (ref 13.0–17.0)
MCH: 31.5 pg (ref 26.0–34.0)
MCHC: 33.6 g/dL (ref 30.0–36.0)
MCV: 93.5 fL (ref 78.0–100.0)
Platelets: 183 10*3/uL (ref 150–400)
RBC: 2.32 MIL/uL — ABNORMAL LOW (ref 4.22–5.81)
RDW: 15.5 % (ref 11.5–15.5)
WBC: 13.8 10*3/uL — AB (ref 4.0–10.5)

## 2015-03-28 LAB — GLUCOSE, CAPILLARY
GLUCOSE-CAPILLARY: 149 mg/dL — AB (ref 65–99)
GLUCOSE-CAPILLARY: 160 mg/dL — AB (ref 65–99)
Glucose-Capillary: 163 mg/dL — ABNORMAL HIGH (ref 65–99)
Glucose-Capillary: 198 mg/dL — ABNORMAL HIGH (ref 65–99)
Glucose-Capillary: 199 mg/dL — ABNORMAL HIGH (ref 65–99)

## 2015-03-28 LAB — SODIUM: Sodium: 152 mmol/L — ABNORMAL HIGH (ref 135–145)

## 2015-03-28 MED ORDER — CLEVIDIPINE BUTYRATE 0.5 MG/ML IV EMUL: 0.0000 mg/h | INTRAVENOUS | Status: DC

## 2015-03-28 MED ORDER — POTASSIUM CHLORIDE 10 MEQ/100ML IV SOLN
10.0000 meq | INTRAVENOUS | Status: AC
  Administered 2015-03-28 (×4): 10 meq via INTRAVENOUS
  Filled 2015-03-28 (×4): qty 100

## 2015-03-28 MED ORDER — SODIUM BICARBONATE 650 MG PO TABS
1300.0000 mg | ORAL_TABLET | Freq: Three times a day (TID) | ORAL | Status: DC
  Administered 2015-03-28 – 2015-03-29 (×6): 1300 mg
  Filled 2015-03-28 (×10): qty 2

## 2015-03-28 MED ORDER — VITAL AF 1.2 CAL PO LIQD
1000.0000 mL | ORAL | Status: DC
  Filled 2015-03-28 (×2): qty 1000

## 2015-03-28 MED ORDER — LABETALOL HCL 5 MG/ML IV SOLN
0.5000 mg/min | INTRAVENOUS | Status: DC
  Administered 2015-03-28: 0.5 mg/min via INTRAVENOUS
  Administered 2015-03-29: 2.33 mg/min via INTRAVENOUS
  Filled 2015-03-28 (×2): qty 100

## 2015-03-28 MED ORDER — SODIUM CHLORIDE 3 % IV SOLN
INTRAVENOUS | Status: DC
  Administered 2015-03-28: 40 mL/h via INTRAVENOUS
  Administered 2015-03-29: 50 mL/h via INTRAVENOUS
  Filled 2015-03-28 (×6): qty 500

## 2015-03-28 MED ORDER — POTASSIUM CHLORIDE 20 MEQ/15ML (10%) PO SOLN
40.0000 meq | Freq: Once | ORAL | Status: AC
  Administered 2015-03-28: 40 meq
  Filled 2015-03-28 (×2): qty 30

## 2015-03-28 NOTE — Progress Notes (Signed)
Pt vomited x 2 while HOB 30 degrees - suctioned, zofran given.  MD notified.  Orders to hold tube feedings x 4 hrs.

## 2015-03-28 NOTE — Progress Notes (Signed)
PULMONARY / CRITICAL CARE MEDICINE   Name: KIZER NOBBE MRN: [id number] DOB: April 27, 1941    ADMISSION DATE:  03/24/2015 CONSULTATION DATE:  5/24  REFERRING MD :  Janann Colonel   CHIEF COMPLAINT:  Vent management   INITIAL PRESENTATION:  [name] is a 74 yo male with multiple medical problems. He presents 5/24 for left ICA stent procedure. During the procedure patient suffered a CT confirmed ischemic stroke with left sided flaccidity and right eye deviation. tPA was administered at 1127 on 04/01/2015 and then taken for additional intervention. Returned to ICU on full vent support. PCCM consulted for vent management.      SIGNIFICANT EVENTS/STUDIES:  5/17: MRI/MRA brain: No acute intracranial process, specifically no acute  ischemia.Involutional changes. Remote LEFT corona radiata lacunar infarct. Moderate to severe white matter changes suggest chronic small vessel ischemic disease.Scattered tiny foci of susceptibility artifact can be seen with chronic hypertension though are nonspecific. Mild luminal regularity of the proximal cerebral arteries most consistent with atherosclerosis. 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 Procedure complicated by acute R carotid occlusion 5/24 Neuro IR procedure: tPA and mechanical clot removal from R MCA with complete recanalization. Left intubated post procedure 5/24 CT head: Large territory right MCA infarct. High-density within the right sylvian fissure most likely is contrast extravasation possibly with some hemorrhage, post right MCA clot retrieval and tPA  5/25 MRI brain: Large territory acute right MCA infarct with mild hemorrhage in the right frontal infarct. Acute infarct also extends into the right PCA territory related to fetal origin of the right posterior cerebral artery. Smaller acute infarcts also noted in the cerebellum bilaterally, left occipital lobe, and left anterior  pons  5/26 CT head: Large territory right MCA infarct shows mild progression in edema since yesterday. Mild midline shift shows slight progression now measuring 2.5 mm 5/26 hypertonic saline protocol initiated by Stroke team  INDWELLING DEVICES:: ETT 5/24 >>  L Webb CVL 5/26  MICRO DATA:   ANTIMICROBIALS:    SUBJECTIVE: RASS -1, not F/C.   VITAL SIGNS: Temp:  [99 F (37.2 C)-99.8 F (37.7 C)] 99.7 F (37.6 C) (05/26 1207) Pulse Rate:  [73-101] 88 (05/26 1500) Resp:  [14-23] 19 (05/26 1500) BP: (147-183)/(58-81) 179/73 mmHg (05/26 1500) SpO2:  [100 %] 100 % (05/26 1500) FiO2 (%):  [40 %] 40 % (05/26 1227) Weight:  [64.9 kg (143 lb 1.3 oz)] 64.9 kg (143 lb 1.3 oz) (05/26 0500) HEMODYNAMICS:   VENTILATOR SETTINGS: Vent Mode:  [-] PRVC FiO2 (%):  [40 %] 40 % Set Rate:  [16 bmp] 16 bmp Vt Set:  [570 mL] 570 mL PEEP:  [5 cmH20] 5 cmH20 Plateau Pressure:  [11 cmH20-15 cmH20] 15 cmH20 INTAKE / OUTPUT:  Intake/Output Summary (Last 24 hours) at 03/28/15 1552 Last data filed at 03/28/15 1516  Gross per 24 hour  Intake 3344.3 ml  Output    530 ml  Net 2814.3 ml    PHYSICAL EXAMINATION: General: RASS -1, not F/C Neuro: L hemiplegia HEENT: NCAT  Cardiovascular: Reg, no M Lungs:  Clear  Abdomen:  Soft, + bowel sounds  EXT: Warm, no edema Skin:  Intact   LABS:  CBC  Recent Labs Lab 03/23/2015 2116 03/27/15 0546 03/28/15 0234  WBC 14.3* 16.5* 13.8*  HGB 8.7* 8.5* 7.3*  HCT 25.8* 25.6* 21.7*  PLT 195 201 183   Coag's No results for input(s): APTT, INR in the last  168 hours. BMET  Recent Labs Lab 03/27/2015 0609 03/27/15 0546 03/28/15 0234  NA 144 140 146*  K 3.2* 4.2 3.1*  CL 114* 120* 114*  CO2  --  10* 22  BUN 57* 52* 55*  CREATININE 2.40* 2.41* 2.40*  GLUCOSE 94 114* 143*   Electrolytes  Recent Labs Lab 03/27/15 0546 03/28/15 0234  CALCIUM 8.2* 8.1*   Sepsis Markers No results for input(s): LATICACIDVEN, PROCALCITON, O2SATVEN in the last 168  hours. ABG  Recent Labs Lab 03/15/2015 1703 04/01/2015 2145 03/27/15 0548  PHART 7.184* 7.263* 7.253*  PCO2ART 32.9* 27.5* 22.7*  PO2ART 543* 245* 187*   Liver Enzymes No results for input(s): AST, ALT, ALKPHOS, BILITOT, ALBUMIN in the last 168 hours. Cardiac Enzymes No results for input(s): TROPONINI, PROBNP in the last 168 hours. Glucose  Recent Labs Lab 03/27/15 1657 03/27/15 2006 03/27/15 2354 03/28/15 0355 03/28/15 0808 03/28/15 1206  GLUCAP 124* 152* 149* 163* 160* 198*    CXR: NACPD    ASSESSMENT / PLAN:  PULMONARY A: VDRF - intubated for neuro IR procedure  Remains intubated due to AMS COPD without wheezing P:   Cont full vent support - settings reviewed and/or adjusted Cont vent bundle Daily SBT if/when meets criteria Cont nebulized BDs   CARDIOVASCULAR A: HTN Bradycardia, resolved P: SBP goals per Stroke team Cont PRN hydralazine   RENAL A:  CKD, baseline Cr 2.07 AKI H/O renal tubular acidosis  Hypokalemia, recurrent P: Monitor BMET intermittently Monitor I/Os Correct electrolytes as indicated Change HCO3 infusion to enteral 5/26  GASTROINTESTINAL A:  NO issues P:   SUP: enteral PPI  Cont TFs   HEMATOLOGIC A:   Anemia of chronic disease  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    NEUROLOGIC A:  Large R MCA teritory CVA Status post re-canalization 5/24 P:   RASS goal:-1 Mgmt per Stroke team Hypertonic saline 5/26 >   CCM X 35 mins  Merton Border, MD ; [facility] [phone].  After 5:30 PM or weekends, call [phone]

## 2015-03-28 NOTE — Progress Notes (Signed)
Initial Nutrition Assessment  DOCUMENTATION CODES:  Severe malnutrition in context of chronic illness  INTERVENTION:  D/C Vital High Protein  Start Vital AF 1.2 @ 60 ml/hr  Provides: 1728 kcal (102% of needs), 108 grams protein, and 1167 ml H2O.   NUTRITION DIAGNOSIS:  Malnutrition related to chronic illness as evidenced by severe depletion of muscle mass and at least 13% weight loss x 5 months.   GOAL:  Patient will meet greater than or equal to 90% of their needs   MONITOR:  Vent status, Labs, Weight trends, TF tolerance  REASON FOR ASSESSMENT:  Consult Enteral/tube feeding initiation and management  ASSESSMENT:  Pt with CVA while in cath lab for left ICA stent procedure 5/24. R MCA tPA and clot retrieval.  5/25: large R MCA territory infarct with hemorrhage extending to R PCA.  Pt currently off sedation but not responding.   Patient is currently intubated on ventilator support, has OG tube MV: 9.6 L/min Temp (24hrs), Avg:99.5 F (37.5 C), Min:99 F (37.2 C), Max:99.8 F (37.7 C)  Labs reviewed: Sodium elevated, potassium decreased, CBG: 149-163 Medications reviewed and include: KCl, Na bicarb  Pt with hx of weight loss, per record review 13% in 5 months.  No family present. Reviewed RD previous notes. Per documentation in 04-18-15 pt was not eating much at home due to oral pain/soreness, pt used Biotene to help. Pt would drink Boost breeze and carnation instant breakfast. Plan was for pt to have teeth extracted.   Nutrition-Focused physical exam completed. Findings are mild/moderate fat depletion, mild/moderate-severe muscle depletion, and no edema.   Pt started Vital High Protein 5/25 and it is currently infusing @ 40 ml/hr and providing: 960 kcal and 84 grams protein.    Height:  Ht Readings from Last 1 Encounters:  03/30/2015 5\' 9"  (1.753 m)    Weight:  Wt Readings from Last 1 Encounters:  03/28/15 143 lb 1.3 oz (64.9 kg)    Ideal Body Weight:   72.7 kg  Wt Readings from Last 10 Encounters:  03/28/15 143 lb 1.3 oz (64.9 kg)  01/10/15 145 lb 12 oz (66.112 kg)  12/10/14 148 lb (67.132 kg)  11/12/14 152 lb (68.947 kg)  10/10/14 165 lb 12.8 oz (75.206 kg)  10/01/14 165 lb 1.6 oz (74.889 kg)  09/24/14 166 lb 11.2 oz (75.615 kg)  08/29/14 161 lb 9.6 oz (73.301 kg)  08/09/14 163 lb (73.936 kg)  07/25/14 159 lb (72.122 kg)    BMI:  Body mass index is 21.12 kg/(m^2).  Estimated Nutritional Needs:  Kcal:  9470  Protein:  105-120 grams  Fluid:  > 2 L/day  Skin:  Reviewed, no issues  Diet Order:     EDUCATION NEEDS:  No education needs identified at this time   Intake/Output Summary (Last 24 hours) at 03/28/15 1428 Last data filed at 03/28/15 1300  Gross per 24 hour  Intake 2943.63 ml  Output    565 ml  Net 2378.63 ml    Last BM:  PTA  Maylon Peppers RD, [place], [place] Pager [phone] After Hours Pager

## 2015-03-28 NOTE — Progress Notes (Signed)
STROKE TEAM PROGRESS NOTE   SUBJECTIVE (INTERVAL HISTORY) His wife and daughter and son are at the bedside.  He is still intubated on sedation. Turn off sedation and he did open eyes on voice. But not following commands. CT repeated showed right hemisphere cerebral edema but no significant midline shift.    OBJECTIVE Temp:  [99.4 F (37.4 C)-100.5 F (38.1 C)] 100.5 F (38.1 C) (05/26 1554) Pulse Rate:  [73-114] 89 (05/26 1730) Cardiac Rhythm:  [-] Normal sinus rhythm (05/26 0700) Resp:  [14-24] 16 (05/26 1730) BP: (155-201)/(57-85) 163/57 mmHg (05/26 1730) SpO2:  [100 %] 100 % (05/26 1730) FiO2 (%):  [40 %] 40 % (05/26 1227) Weight:  [143 lb 1.3 oz (64.9 kg)] 143 lb 1.3 oz (64.9 kg) (05/26 0500)   Recent Labs Lab 03/27/15 2354 03/28/15 0355 03/28/15 0808 03/28/15 1206 03/28/15 1552  GLUCAP 149* 163* 160* 198* 199*    Recent Labs Lab 03/07/2015 0609 03/27/15 0546 03/28/15 0234  NA 144 140 146*  K 3.2* 4.2 3.1*  CL 114* 120* 114*  CO2  --  10* 22  GLUCOSE 94 114* 143*  BUN 57* 52* 55*  CREATININE 2.40* 2.41* 2.40*  CALCIUM  --  8.2* 8.1*   No results for input(s): AST, ALT, ALKPHOS, BILITOT, PROT, ALBUMIN in the last 168 hours.  Recent Labs Lab 04/01/2015 0609 03/12/2015 2116 03/27/15 0546 03/28/15 0234  WBC  --  14.3* 16.5* 13.8*  NEUTROABS  --   --  15.0*  --   HGB 11.9* 8.7* 8.5* 7.3*  HCT 35.0* 25.8* 25.6* 21.7*  MCV  --  92.5 93.4 93.5  PLT  --  195 201 183   No results for input(s): CKTOTAL, CKMB, CKMBINDEX, TROPONINI in the last 168 hours. No results for input(s): LABPROT, INR in the last 72 hours. No results for input(s): COLORURINE, LABSPEC, [place], GLUCOSEU, HGBUR, BILIRUBINUR, KETONESUR, PROTEINUR, UROBILINOGEN, NITRITE, LEUKOCYTESUR in the last 72 hours.  Invalid input(s): APPERANCEUR     Component Value Date/Time   CHOL 113 03/19/2015 0340   TRIG 249* 03/27/2015 0546   TRIG 135 10/13/2006 0754   HDL 24* 03/19/2015 0340   CHOLHDL 4.7  03/19/2015 0340   CHOLHDL 4.6 CALC 10/13/2006 0754   VLDL 49* 03/19/2015 0340   LDLCALC 40 03/19/2015 0340   Lab Results  Component Value Date   HGBA1C 5.3 03/19/2015      Component Value Date/Time   LABOPIA NONE DETECTED 03/19/2015 0329   COCAINSCRNUR NONE DETECTED 03/19/2015 0329   LABBENZ POSITIVE* 03/19/2015 0329   AMPHETMU NONE DETECTED 03/19/2015 0329   THCU NONE DETECTED 03/19/2015 0329   LABBARB NONE DETECTED 03/19/2015 0329    No results for input(s): ETH in the last 168 hours.  I have personally reviewed the radiological images below and agree with the radiology interpretations.  Ct Head Wo Contrast 03/28/15 Large territory right MCA infarct shows mild progression in edema since yesterday. Mild midline shift shows slight progression now measuring 2.5 mm. Improvement high attenuation blood/ contrast in the right sylvian fissure. Small amount of blood in the lateral ventricle.  03/15/2015   IMPRESSION: Large territory right MCA infarct. High-density within the right sylvian fissure most likely is contrast extravasation possibly with some hemorrhage, post right MCA clot retrieval and tPA.   Ct Head Wo Contrast  03/18/2015   IMPRESSION: Atrophy with small vessel chronic ischemic changes of deep cerebral white matter.  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  No acute intracranial abnormalities.  Mri and Mra Brain Without Contrast  03/19/2015    IMPRESSION: MRI HEAD: No acute intracranial process, specifically no acute ischemia.  Involutional changes. Remote LEFT corona radiata lacunar infarct. Moderate to severe white matter changes suggest chronic small vessel ischemic disease.  Scattered tiny foci of susceptibility artifact can be seen with chronic hypertension though are nonspecific.  MRA HEAD: No acute large vessel occlusion or high-grade stenosis. Complete circle of Willis.  Mild luminal regularity of the proximal cerebral arteries most consistent  with atherosclerosis.    Portable Chest Xray  03/27/2015    IMPRESSION: No active disease.  Endotracheal tube with tip 5.9 cm above the carina.    2D Echocardiogram  - Left ventricle: The cavity size was normal. Wall thickness was increased in a pattern of mild LVH. The estimated ejection fraction was 55%. Wall motion was normal; there were no regional wall motion abnormalities. - Left atrium: The atrium was mildly dilated. - Impressions: No cardiac source of embolism was identified, but cannot be ruled out on the basis of this examination. Impressions: - No cardiac source of embolism was identified, but cannot be ruled out on the basis of this examination.  PHYSICAL EXAM  Temp:  [99.4 F (37.4 C)-100.5 F (38.1 C)] 100.5 F (38.1 C) (05/26 1554) Pulse Rate:  [73-114] 89 (05/26 1730) Resp:  [14-24] 16 (05/26 1730) BP: (155-201)/(57-85) 163/57 mmHg (05/26 1730) SpO2:  [100 %] 100 % (05/26 1730) FiO2 (%):  [40 %] 40 % (05/26 1227) Weight:  [143 lb 1.3 oz (64.9 kg)] 143 lb 1.3 oz (64.9 kg) (05/26 0500)  General - Well nourished, well developed, still intubated and just off sedation.  Ophthalmologic - fundi not visualized due to eye movement.  Cardiovascular - Regular rate and rhythm.  Neuro - intubated and just off sedated, eyes open on voice, doll's eyes present, pupil equal reactive to light, left corneal reflex absent, right corneal reflex present, weak cough reflex present. Breathing over the vent, on pain stimulation, right upper extremity able to against gravity, left upper extremity 0/5, bilateral lower extremity withdrawal to pain 2+/5. Reflex 1+, left Babinski positive. Sensation, coordination and gait not tested.  ASSESSMENT/PLAN [name] is a 74 y.o. male with history of hypertension, hyperlipidemia, diabetes mellitus, COPD, coronary artery disease, peripheral vascular disease and TIA due to left ICA high grade stenosis admitted for right MCA stroke  during left CEA procedure. Patient was intubated, as status post IV TPA and right MCA mechanical thrombectomy with re-cannulization. However, MRI showed right complete MCA infarct.    Stroke:  Non-dominant right large MCA malignant infarct s/p IV tPA and mechanical thrombectomy with recannulization, embolic secondary to procedure related.   MRI right large MCA malignant infarct  MRA  Right MCA patent with right hemisphere luxury perfusion  Repeat CT showed right hemisphere cerebral edema without significant midline shift  Carotid Doppler 03/19/15 - left ICA > 80% stenosis  2D Echo  unremarkable  LDL 40  HgbA1c 5.3  SCDs for VTE prophylaxis  aspirin 81 mg orally every day and clopidogrel 75 mg orally every day prior to admission, now on no antithrombotic due to [facility] and IVH post procedure.   Discussed with wife, daughter, and son regarding patient diagnosis, treatment plan and likely prognosis. Outcome guarded at this time.   SAH and IVH post procedure  CT showed right sylvian fissure SAH and small amount of IVH  Repeat CT showed improvement of SAH   Likely related to thrombectomy  Hold off antiplatelets for now  Cerebral edema  Mild midline shift  Repeat CT showed minimal midline shift  Started on 3% saline   Sodium goal 150-160  Not a candidate for hemicraniectomy based on age and medical condition  Discussed with wife and family and they understand  Carotid stenosis  Left ICA stenosis > 80% at bulb  Avoid hypotension  BP goal 130-160   Hypertension  Home meds: Clonidine, labetalol  Currently no BP meds  BP at high side, put on cleviprex  Avoid hypotension  BP goal 130-160   Hyperlipidemia  Home meds: lipitor 10, resumed in hospital  LDL 40, goal < 70  Continue statin at discharge  DM  A1C 5.5  Under control  Other Stroke Risk Factors  Advanced age  Cigarette smoker, quit smoking 10 months ago , re-advised to stop  smoking  Coronary artery disease - MI, PCI s/ stent 1998  PVD  Other Active Problems  Anxiety  COPD w/ emphysema  GERD  Barrett's esophagus  Depression  AAA repair 2010   CKD stage 3  hypokalemia  Other Pertinent History  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 This patient's care requires constant monitoring of vital signs, hemodynamics, respiratory and cardiac monitoring, review of multiple databases, neurological assessment, discussion with family, other specialists and medical decision making of high complexity. I spent 50 minutes of neurocritical care time in the care of this patient.   Rosalin Hawking, MD PhD Stroke Neurology 03/28/2015 5:42 PM    To contact Stroke Continuity provider, please refer to [email or web address] After hours, contact General Neurology

## 2015-03-28 NOTE — Progress Notes (Signed)
  [facility] Progress Note  03/28/2015 10:46 AM 2 Days Post-Op  Subjective:  Propofol off.  On vent.   Tmax 99.8 BP sys 120s-180s Fi02 .4 100%   Filed Vitals:   03/28/15 1000  BP: 183/70  Pulse: 87  Temp:   Resp: 17    Physical Exam: General: not sedated on vent. Not arousable. Not following commands.  CV: RRR Lungs: clear to ausculation.   CBC    Component Value Date/Time   WBC 13.8* 03/28/2015 0234   RBC 2.32* 03/28/2015 0234   RBC 2.65* 05/01/2014 0455   HGB 7.3* 03/28/2015 0234   HCT 21.7* 03/28/2015 0234   PLT 183 03/28/2015 0234   MCV 93.5 03/28/2015 0234   MCH 31.5 03/28/2015 0234   MCHC 33.6 03/28/2015 0234   RDW 15.5 03/28/2015 0234   LYMPHSABS 0.5* 03/27/2015 0546   MONOABS 1.0 03/27/2015 0546   EOSABS 0.0 03/27/2015 0546   BASOSABS 0.0 03/27/2015 0546    BMET    Component Value Date/Time   NA 146* 03/28/2015 0234   K 3.1* 03/28/2015 0234   CL 114* 03/28/2015 0234   CO2 22 03/28/2015 0234   GLUCOSE 143* 03/28/2015 0234   GLUCOSE 131* 10/13/2006 0754   BUN 55* 03/28/2015 0234   CREATININE 2.40* 03/28/2015 0234   CALCIUM 8.1* 03/28/2015 0234   CALCIUM 9.6 03/23/2013 0743   GFRNONAA 25* 03/28/2015 0234   GFRAA 29* 03/28/2015 0234    INR    Component Value Date/Time   INR 1.12 03/18/2015 2124     Intake/Output Summary (Last 24 hours) at 03/28/15 1046 Last data filed at 03/28/15 0800  Gross per 24 hour  Intake 2435.3 ml  Output    705 ml  Net 1730.3 ml     Assessment:  74 y.o. male with right MCA during attempted carotid stenting, SAH, cerebral edema   2 Days Post-Op  Plan: -No arousable off sedation.  -Increased cerebral edema on today's CT.  -CXR clear -Replete K+ -SBP goals per stroke protocol -Management per primary and CCM.    Virgina Jock, PA-C [facility] Office: [phone] Pager: [phone] 03/28/2015 10:46 AM

## 2015-03-28 NOTE — Progress Notes (Signed)
OT Cancellation Note  Patient Details Name: [name] MRN: [id number] DOB: 02-10-1941   Cancelled Treatment:    Reason Eval/Treat Not Completed: Patient not medically ready (Pt on strict bedrest. ) Please update activity orders when appropriate for therapy. Thanks [place], OTR/L  [phone] 03/28/2015 03/28/2015, 8:18 AM

## 2015-03-28 NOTE — Progress Notes (Signed)
RT assisted in transported to CT. Pt transported without any complication. Returned to [facility]. RT will monitor.

## 2015-03-28 NOTE — Progress Notes (Signed)
PT Cancellation Note  Patient Details Name: [name] MRN: [id number] DOB: 03/19/1941   Cancelled Treatment:    Reason Eval/Treat Not Completed: Patient not medically ready.  Pt still on strict bedrest. 03/28/2015  Donnella Sham, [place] [phone]  (pager)   Scarleth Brame, Tessie Fass 03/28/2015, 9:50 AM

## 2015-03-28 NOTE — Progress Notes (Signed)
SLP Cancellation Note  Patient Details Name: [name] MRN: [id number] DOB: 06-Apr-1941   Cancelled treatment:       Reason Eval/Treat Not Completed: Patient not medically ready. Please re consult when appropriate.  [place], CCC-SLP [phone]    Marcee Jacobs [place] 03/28/2015, 8:10 AM

## 2015-03-28 NOTE — Procedures (Signed)
PROCEDURE NOTE: CVL PLACEMENT  INDICATION:    Monitoring of central venous pressures and/or administration of medications optimally administered in central vein  CONSENT:   Risks of procedure as well as the alternatives were explained to the patient or surrogate. Consent for procedure obtained. A time out was performed.   PROCEDURE  Sterile technique was used including antiseptics, cap, gloves, gown, hand hygiene, mask and full body sheet.  Skin prep: Chlorhexidine; local anesthetic administered  A triple lumen catheter was placed in the [place] vein using the Seldinger technique.   EVALUATION:  Blood flow good  Complications: No apparent complications  Patient tolerated the procedure well.  Chest X-ray ordered to verify placement and is pending   Bertrand Vowels, MD PCCM service Mobile [phone]    

## 2015-03-29 ENCOUNTER — Inpatient Hospital Stay (HOSPITAL_COMMUNITY): Payer: Medicare Other

## 2015-03-29 LAB — BASIC METABOLIC PANEL
Anion gap: 11 (ref 5–15)
BUN: 55 mg/dL — AB (ref 6–20)
CALCIUM: 7.9 mg/dL — AB (ref 8.9–10.3)
CO2: 27 mmol/L (ref 22–32)
Chloride: 115 mmol/L — ABNORMAL HIGH (ref 101–111)
Creatinine, Ser: 2.26 mg/dL — ABNORMAL HIGH (ref 0.61–1.24)
GFR, EST AFRICAN AMERICAN: 31 mL/min — AB (ref >60)
GFR, EST NON AFRICAN AMERICAN: 27 mL/min — AB (ref >60)
Glucose, Bld: 134 mg/dL — ABNORMAL HIGH (ref 65–99)
Potassium: 3.9 mmol/L (ref 3.5–5.1)
Sodium: 153 mmol/L — ABNORMAL HIGH (ref 135–145)

## 2015-03-29 LAB — CBC
HCT: 18.5 % — ABNORMAL LOW (ref 39.0–52.0)
HCT: 18.8 % — ABNORMAL LOW (ref 39.0–52.0)
HCT: 23.4 % — ABNORMAL LOW (ref 39.0–52.0)
HEMOGLOBIN: 7.6 g/dL — AB (ref 13.0–17.0)
Hemoglobin: 6 g/dL — CL (ref 13.0–17.0)
Hemoglobin: 6.1 g/dL — CL (ref 13.0–17.0)
MCH: 30.8 pg (ref 26.0–34.0)
MCH: 30.8 pg (ref 26.0–34.0)
MCH: 31 pg (ref 26.0–34.0)
MCHC: 32.4 g/dL (ref 30.0–36.0)
MCHC: 32.4 g/dL (ref 30.0–36.0)
MCHC: 32.5 g/dL (ref 30.0–36.0)
MCV: 94.9 fL (ref 78.0–100.0)
MCV: 94.9 fL (ref 78.0–100.0)
MCV: 95.5 fL (ref 78.0–100.0)
PLATELETS: 167 10*3/uL (ref 150–400)
Platelets: 169 10*3/uL (ref 150–400)
Platelets: 174 10*3/uL (ref 150–400)
RBC: 1.95 MIL/uL — ABNORMAL LOW (ref 4.22–5.81)
RBC: 1.98 MIL/uL — ABNORMAL LOW (ref 4.22–5.81)
RBC: 2.45 MIL/uL — ABNORMAL LOW (ref 4.22–5.81)
RDW: 15.9 % — ABNORMAL HIGH (ref 11.5–15.5)
RDW: 15.9 % — ABNORMAL HIGH (ref 11.5–15.5)
RDW: 16.3 % — ABNORMAL HIGH (ref 11.5–15.5)
WBC: 12.6 10*3/uL — ABNORMAL HIGH (ref 4.0–10.5)
WBC: 12.6 10*3/uL — ABNORMAL HIGH (ref 4.0–10.5)
WBC: 13.4 10*3/uL — ABNORMAL HIGH (ref 4.0–10.5)

## 2015-03-29 LAB — GLUCOSE, CAPILLARY
GLUCOSE-CAPILLARY: 121 mg/dL — AB (ref 65–99)
GLUCOSE-CAPILLARY: 133 mg/dL — AB (ref 65–99)
GLUCOSE-CAPILLARY: 140 mg/dL — AB (ref 65–99)
GLUCOSE-CAPILLARY: 144 mg/dL — AB (ref 65–99)
Glucose-Capillary: 148 mg/dL — ABNORMAL HIGH (ref 65–99)
Glucose-Capillary: 134 mg/dL — ABNORMAL HIGH (ref 65–99)
Glucose-Capillary: 146 mg/dL — ABNORMAL HIGH (ref 65–99)

## 2015-03-29 LAB — TRIGLYCERIDES: Triglycerides: 166 mg/dL — ABNORMAL HIGH (ref <150)

## 2015-03-29 LAB — SODIUM
SODIUM: 162 mmol/L — AB (ref 135–145)
Sodium: 157 mmol/L — ABNORMAL HIGH (ref 135–145)

## 2015-03-29 LAB — PREPARE RBC (CROSSMATCH)

## 2015-03-29 MED ORDER — PANTOPRAZOLE SODIUM 40 MG PO PACK
40.0000 mg | PACK | Freq: Two times a day (BID) | ORAL | Status: DC
  Administered 2015-03-29 – 2015-03-31 (×6): 40 mg
  Filled 2015-03-29 (×10): qty 20

## 2015-03-29 MED ORDER — CLEVIDIPINE BUTYRATE 0.5 MG/ML IV EMUL
0.0000 mg/h | INTRAVENOUS | Status: DC
  Administered 2015-03-29: 2 mg/h via INTRAVENOUS
  Administered 2015-03-29: 8 mg/h via INTRAVENOUS
  Administered 2015-03-29: 2 mg/h via INTRAVENOUS
  Administered 2015-03-30 (×4): 8 mg/h via INTRAVENOUS
  Administered 2015-03-31: 10 mg/h via INTRAVENOUS
  Administered 2015-03-31: 9 mg/h via INTRAVENOUS
  Administered 2015-03-31 – 2015-04-01 (×4): 10 mg/h via INTRAVENOUS
  Filled 2015-03-29: qty 100
  Filled 2015-03-29 (×4): qty 50
  Filled 2015-03-29: qty 100
  Filled 2015-03-29: qty 50
  Filled 2015-03-29 (×5): qty 100
  Filled 2015-03-29: qty 50
  Filled 2015-03-29 (×3): qty 100
  Filled 2015-03-29 (×2): qty 50
  Filled 2015-03-29: qty 100
  Filled 2015-03-29 (×2): qty 50
  Filled 2015-03-29 (×2): qty 100
  Filled 2015-03-29 (×2): qty 50
  Filled 2015-03-29 (×2): qty 100
  Filled 2015-03-29: qty 50
  Filled 2015-03-29 (×2): qty 100
  Filled 2015-03-29: qty 50
  Filled 2015-03-29: qty 100
  Filled 2015-03-29: qty 50
  Filled 2015-03-29 (×2): qty 100
  Filled 2015-03-29 (×2): qty 50
  Filled 2015-03-29: qty 100
  Filled 2015-03-29: qty 50
  Filled 2015-03-29 (×4): qty 100
  Filled 2015-03-29: qty 50
  Filled 2015-03-29 (×2): qty 100
  Filled 2015-03-29 (×3): qty 50
  Filled 2015-03-29: qty 100
  Filled 2015-03-29: qty 50
  Filled 2015-03-29: qty 100
  Filled 2015-03-29: qty 50
  Filled 2015-03-29 (×3): qty 100
  Filled 2015-03-29: qty 50
  Filled 2015-03-29: qty 100
  Filled 2015-03-29: qty 50

## 2015-03-29 MED ORDER — SODIUM CHLORIDE 0.9 % IV SOLN
Freq: Once | INTRAVENOUS | Status: AC
  Administered 2015-03-29: 05:00:00 via INTRAVENOUS

## 2015-03-29 NOTE — Progress Notes (Signed)
Nutrition Follow-up  DOCUMENTATION CODES:  Severe malnutrition in context of chronic illness  INTERVENTION:  If aggressive care continues recommend re-start tube feeding.   Recommend Vital AF 1.2 start @ 20 ml/hr and increase by 10 ml every 4 hours to goal rate of 60 ml/hr.  Provides: 1728 kcal (102% of needs), 108 grams protein, and 1167 ml H2O.   NUTRITION DIAGNOSIS:  Malnutrition related to chronic illness as evidenced by severe depletion of muscle mass, moderate depletion of body fat, percent weight loss.  ongoing  GOAL:  Patient will meet greater than or equal to 90% of their needs  Met.   MONITOR:  Vent status, Labs, Weight trends, TF tolerance  REASON FOR ASSESSMENT:  Consult Enteral/tube feeding initiation and management  ASSESSMENT:  Pt with CVA while in cath lab for left ICA stent procedure 5/24. R MCA tPA and clot retrieval.  5/25: large R MCA territory infarct with hemorrhage extending to R PCA.   Patient is currently intubated on ventilator support MV: 9.6 L/min Temp (24hrs), Avg:100.1 F (37.8 C), Min:99.5 F (37.5 C), Max:101.1 F (38.4 C)  Pt discussed during ICU rounds and with RN.  Labs reviewed: 157 Pt vomited 5/26 1700, TF held. Per rounds MD wants to continue to hold TF. Plan for CT tomorrow and then family discussion for goals of care.    Height:  Ht Readings from Last 1 Encounters:  03/23/2015 5' 9" (1.753 m)    Weight:  Wt Readings from Last 1 Encounters:  03/29/15 145 lb 8.1 oz (66 kg)    Ideal Body Weight:  72.7 kg  Wt Readings from Last 10 Encounters:  03/29/15 145 lb 8.1 oz (66 kg)  01/10/15 145 lb 12 oz (66.112 kg)  12/10/14 148 lb (67.132 kg)  11/12/14 152 lb (68.947 kg)  10/10/14 165 lb 12.8 oz (75.206 kg)  10/01/14 165 lb 1.6 oz (74.889 kg)  09/24/14 166 lb 11.2 oz (75.615 kg)  08/29/14 161 lb 9.6 oz (73.301 kg)  08/09/14 163 lb (73.936 kg)  07/25/14 159 lb (72.122 kg)    BMI:  Body mass index is 21.48  kg/(m^2).  Estimated Nutritional Needs:  Kcal:  1697  Protein:  105-120 grams  Fluid:  > 2 L/day  Skin:  Reviewed, no issues  Diet Order:    NPO  EDUCATION NEEDS:  No education needs identified at this time   Intake/Output Summary (Last 24 hours) at 03/29/15 1356 Last data filed at 03/29/15 1100  Gross per 24 hour  Intake 2056.87 ml  Output   1000 ml  Net 1056.87 ml    Last BM:  PTA    RD, LDN, CNSC [phone] Pager [phone] After Hours Pager  

## 2015-03-29 NOTE — Progress Notes (Signed)
PULMONARY / CRITICAL CARE MEDICINE   Name: [name] MRN: [id number] DOB: [birth year]    ADMISSION DATE:  03/13/2015 CONSULTATION DATE:  5/24  REFERRING MD :  Janann Colonel   CHIEF COMPLAINT:  Vent management   INITIAL PRESENTATION:  [name] is a 74 yo male with multiple medical problems. He presents 5/24 for left ICA stent procedure. During the procedure patient suffered a CT confirmed ischemic stroke with left sided flaccidity and right eye deviation. tPA was administered at 1127 on 03/29/2015 and then taken for additional intervention. Returned to ICU on full vent support. PCCM consulted for vent management.     INDWELLING DEVICES:: ETT 5/24 >>  L  CVL 5/26  MICRO DATA:   ANTIMICROBIALS:      SIGNIFICANT EVENTS/STUDIES:  5/17: MRI/MRA brain: No acute intracranial process, specifically no acute  ischemia.Involutional changes. Remote LEFT corona radiata lacunar infarct. Moderate to severe white matter changes suggest chronic small vessel ischemic disease.Scattered tiny foci of susceptibility artifact can be seen with chronic hypertension though are nonspecific. Mild luminal regularity of the proximal cerebral arteries most consistent with atherosclerosis. 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 Procedure complicated by acute R carotid occlusion 5/24 Neuro IR procedure: tPA and mechanical clot removal from R MCA with complete recanalization. Left intubated post procedure 5/24 CT head: Large territory right MCA infarct. High-density within the right sylvian fissure most likely is contrast extravasation possibly with some hemorrhage, post right MCA clot retrieval and tPA  5/25 MRI brain: Large territory acute right MCA infarct with mild hemorrhage in the right frontal infarct. Acute infarct also extends into the right PCA territory related to fetal origin of the right posterior cerebral artery. Smaller  acute infarcts also noted in the cerebellum bilaterally, left occipital lobe, and left anterior pons  5/26 CT head: Large territory right MCA infarct shows mild progression in edema since yesterday. Mild midline shift shows slight progression now measuring 2.5 mm 5/26 hypertonic saline protocol initiated by Stroke team   SUBJECTIVE/OVERNIGHT/INTERVAL HX 03/29/15: No significant change. FAmily at bedside and preparing for worst outcome. D/w Dr Erlinda Hong 0- trying to manage BP with cleviprex today because labetalol not sufficient and also trying to wean off  Diprivan. Aim for CT head repeat over next 24-48h. /sp 1 U PRBC 03/29/2015   VITAL SIGNS: Temp:  [99.5 F (37.5 C)-101.1 F (38.4 C)] 99.8 F (37.7 C) (05/27 0806) Pulse Rate:  [77-120] 82 (05/27 0900) Resp:  [15-32] 16 (05/27 0900) BP: (128-201)/(55-85) 165/63 mmHg (05/27 0900) SpO2:  [99 %-100 %] 100 % (05/27 0900) FiO2 (%):  [40 %] 40 % (05/27 0900) Weight:  [66 kg (145 lb 8.1 oz)] 66 kg (145 lb 8.1 oz) (05/27 0500) HEMODYNAMICS:   VENTILATOR SETTINGS: Vent Mode:  [-] PRVC FiO2 (%):  [40 %] 40 % Set Rate:  [16 bmp] 16 bmp Vt Set:  [570 mL] 570 mL PEEP:  [5 cmH20] 5 cmH20 Plateau Pressure:  [15 cmH20-18 cmH20] 15 cmH20 INTAKE / OUTPUT:  Intake/Output Summary (Last 24 hours) at 03/29/15 0953 Last data filed at 03/29/15 0900  Gross per 24 hour  Intake 2680.2 ml  Output   1200 ml  Net 1480.2 ml    PHYSICAL EXAMINATION: General: RASS -3, not F/C Neuro: L hemiplegia HEENT: NCAT  Cardiovascular: Reg, no M Lungs:  Clear  Abdomen:  Soft, + bowel sounds  EXT: Warm, no edema Skin:  Intact  LABS:  PULMONARY  Recent Labs Lab 04/01/2015 0609 03/06/2015 1703 03/31/2015 2145 03/27/15 0548  PHART  --  7.184* 7.263* 7.253*  PCO2ART  --  32.9* 27.5* 22.7*  PO2ART  --  543* 245* 187*  HCO3  --  11.9* 12.0* 9.7*  TCO2 15 12.9 12.8 10.4  O2SAT  --  99.7 99.3 99.0    CBC  Recent Labs Lab 03/28/15 0234 03/29/15 0150  03/29/15 0250  HGB 7.3* 6.0* 6.1*  HCT 21.7* 18.5* 18.8*  WBC 13.8* 12.6* 12.6*  PLT 183 169 174    COAGULATION No results for input(s): INR in the last 168 hours.  CARDIAC  No results for input(s): TROPONINI in the last 168 hours. No results for input(s): PROBNP in the last 168 hours.   CHEMISTRY  Recent Labs Lab 03/07/2015 0609 03/27/15 0546 03/28/15 0234 03/28/15 2037 03/29/15 0150  NA 144 140 146* 152* 153*  K 3.2* 4.2 3.1*  --  3.9  CL 114* 120* 114*  --  115*  CO2  --  10* 22  --  27  GLUCOSE 94 114* 143*  --  134*  BUN 57* 52* 55*  --  55*  CREATININE 2.40* 2.41* 2.40*  --  2.26*  CALCIUM  --  8.2* 8.1*  --  7.9*   Estimated Creatinine Clearance: 27.2 mL/min (by C-G formula based on Cr of 2.26).   LIVER No results for input(s): AST, ALT, ALKPHOS, BILITOT, PROT, ALBUMIN, INR in the last 168 hours.   INFECTIOUS No results for input(s): LATICACIDVEN, PROCALCITON in the last 168 hours.   ENDOCRINE CBG (last 3)   Recent Labs  03/28/15 2337 03/29/15 0324 03/29/15 0804  GLUCAP 144* 133* 148*         IMAGING x48h  - iCt Head Wo Contrast  03/28/2015   CLINICAL DATA:  Stroke  EXAM: CT HEAD WITHOUT CONTRAST  TECHNIQUE: Contiguous axial images were obtained from the base of the skull through the vertex without intravenous contrast.  COMPARISON:  CT and MRI 03/27/2015  FINDINGS: Large territory right MCA infarct shows progressive edema from yesterday. There is edema in the right basal ganglia which also appears to have progressed. Diffuse low-density edema is seen throughout the right frontal, temporal, parietal lobes with extension into the right occipital lobe. High attenuation blood/ contrast in the right sylvian fissure has improved.  Acute infarct in the cerebellum bilaterally best seen on MRI. Small acute infarct in the left occipital lobe and left pons also better seen by MRI.  Small amount of blood layering in the left occipital horn, not seen previously.  This likely is related to previous subarachnoid hemorrhage entering the ventricle.  Mild midline shift has progressed now measuring approximately 2.5 mm, ventricle size remains normal  IMPRESSION: Large territory right MCA infarct shows mild progression in edema since yesterday. Mild midline shift shows slight progression now measuring 2.5 mm.  Improvement high attenuation blood/ contrast in the right sylvian fissure. Small amount of blood in the lateral ventricle.   Electronically Signed   By: Franchot Gallo M.D.   On: 03/28/2015 07:07   Ct Head Wo Contrast  03/27/2015   CLINICAL DATA:  Twenty priors status post tPA therapy.  EXAM: CT HEAD WITHOUT CONTRAST  TECHNIQUE: Contiguous axial images were obtained from the base of the skull through the vertex without intravenous contrast.  COMPARISON:  03/23/2015 and MR brain 03/19/2015.  FINDINGS: Low-attenuation in the right middle cerebral artery territory is again seen with sulcal  effacement throughout the right cerebral hemisphere. Associated areas of subarachnoid high attenuation persist but have improved somewhat in the interval. 1-2 mm of right to left midline shift. High attenuation is again seen along the tentorium as well. Periventricular low attenuation. No hydrocephalus or mass lesion.  IMPRESSION: 1. Expected evolutionary changes of acute right middle cerebral artery infarction with associated edema and 1-2 mm right to left midline shift. 2. Subarachnoid high attenuation has improved somewhat in the interval and may represent a combination of post tPA hemorrhage and residual extravasated IV contrast. 3. Mild chronic microvascular white matter ischemic changes.   Electronically Signed   By: Lorin Picket M.D.   On: 03/27/2015 10:22   Mr Brain Wo Contrast  03/27/2015   CLINICAL DATA:  . Attempted left carotid stenting 03/06/2015. Developed right MCA stroke during procedure. Right MCA clot retrieval and tPA administration yesterday.  EXAM: MRI HEAD  WITHOUT CONTRAST  MRA HEAD WITHOUT CONTRAST  TECHNIQUE: Multiplanar, multiecho pulse sequences of the brain and surrounding structures were obtained without intravenous contrast. Angiographic images of the head were obtained using MRA technique without contrast.  COMPARISON:  CT head 03/16/2015  FINDINGS: MRI HEAD FINDINGS  Acute infarct right MCA territory. This involves most of the right MCA territory including right frontal, temporal, and parietal lobes with extension into the right occipital lobe. Note is made of a patent right posterior communicating artery allowing for right occipital infarction. Acute infarct in the right lateral putamen and right tail of caudate. Small areas of acute infarct in the superior new cerebellum bilaterally and in the left occipital lobe. Small acute infarct left pons.  Mild mass-effect related to cerebral edema. Minimal shift of the midline structures measuring 1 mm. Mild hemorrhage in the right frontal lobe acute infarct. Scattered areas of chronic micro hemorrhage are present bilaterally likely related to chronic hypertension.  Chronic infarct in the centrum semiovale on the left. Chronic microvascular ischemic changes in the white matter. Mild chronic ischemia in the pons. No underlying mass lesion. Ventricle size is normal.  MRA HEAD FINDINGS  Right vertebral dominant. Right vertebral widely patent to the basilar. Left vertebral artery is hypoplastic after the PICA. Right PICA not visualized. Large right AICA patent. Basilar patent. Superior cerebellar and posterior cerebral arteries patent bilaterally. Fetal origin of the posterior cerebral artery bilaterally which is patent bilaterally.  Internal carotid artery widely patent bilaterally. Anterior and middle cerebral arteries widely patent. No branch occlusion or stenosis. Right M1 segment appears normal post right MCA clot retrieval  Negative for cerebral aneurysm.  IMPRESSION: Large territory acute right MCA infarct with  mild hemorrhage in the right frontal infarct. Acute infarct also extends into the right PCA territory related to fetal origin of the right posterior cerebral artery. Smaller acute infarcts also noted in the cerebellum bilaterally, left occipital lobe, and left anterior pons.  Mild chronic microvascular ischemia. Mild chronic micro hemorrhage in the brain related to chronic hypertension  MRA and circle Willis is normal. Right MCA appears normal. Fetal origin of the posterior cerebral artery bilaterally.   Electronically Signed   By: Franchot Gallo M.D.   On: 03/27/2015 11:05   Dg Chest Port 1 View  03/29/2015   CLINICAL DATA:  Hypoxia  EXAM: PORTABLE CHEST - 1 VIEW  COMPARISON:  Mar 28, 2015  FINDINGS: Endotracheal tube tip is 2.2 cm above the carina. Central catheter tip is in the superior vena cava. Nasogastric tube tip and side port are in the stomach. No  pneumothorax. There is no edema or consolidation. Heart size and pulmonary vascularity are normal. No adenopathy.  IMPRESSION: Tube and catheter positions as described without pneumothorax. No edema or consolidation.   Electronically Signed   By: Lowella Grip III M.D.   On: 03/29/2015 07:42   Dg Chest Port 1 View  03/28/2015   CLINICAL DATA:  Central line placement  EXAM: PORTABLE CHEST - 1 VIEW  COMPARISON:  03/28/2015  FINDINGS: Cardiomediastinal silhouette is stable. Stable endotracheal and NG tube position. There is left subclavian central line with tip in SVC. No pneumothorax. No acute infiltrate or pulmonary edema.  IMPRESSION: No active disease. Left subclavian central line with tip in SVC. No pneumothorax. Stable endotracheal and NG tube position.   Electronically Signed   By: Lahoma Crocker M.D.   On: 03/28/2015 11:39   Dg Chest Port 1 View  03/28/2015   CLINICAL DATA:  Respiratory failure, diabetes, CVA.  EXAM: PORTABLE CHEST - 1 VIEW  COMPARISON:  Portable chest x-ray of Mar 27, 2015  FINDINGS: There is been interval intubation of the  esophagus. The tip of the EG tube projects below the inferior margin of the image. The endotracheal tube tip projects 4.2 cm above the carina. The lungs remain well-expanded and clear. The heart and pulmonary vascularity are normal. There is no pleural effusion or pneumothorax. There are old rib deformities on the right.  IMPRESSION: Interval intubation of the esophagus ; stable positioning of the endotracheal tube. No acute cardiopulmonary abnormality is demonstrated.   Electronically Signed   By: David  Martinique M.D.   On: 03/28/2015 08:05   Dg Abd Portable 1v  03/27/2015   CLINICAL DATA:  Bedside OG tube placement.  EXAM: PORTABLE ABDOMEN - 1 VIEW  COMPARISON:  No recent abdominal imaging.  PET-CT 09/07/2014.  FINDINGS: OG tube tip projects over the fundus of the stomach. Visualized upper abdominal bowel gas pattern unremarkable. Aortic stent graft. Surgical clips at the esophagogastric junction.  IMPRESSION: OG tube tip projects over the fundus of the stomach.   Electronically Signed   By: Evangeline Dakin M.D.   On: 03/27/2015 18:06   Mr Jodene Nam Head/brain Wo Cm  03/27/2015   CLINICAL DATA:  . Attempted left carotid stenting 03/06/2015. Developed right MCA stroke during procedure. Right MCA clot retrieval and tPA administration yesterday.  EXAM: MRI HEAD WITHOUT CONTRAST  MRA HEAD WITHOUT CONTRAST  TECHNIQUE: Multiplanar, multiecho pulse sequences of the brain and surrounding structures were obtained without intravenous contrast. Angiographic images of the head were obtained using MRA technique without contrast.  COMPARISON:  CT head 03/16/2015  FINDINGS: MRI HEAD FINDINGS  Acute infarct right MCA territory. This involves most of the right MCA territory including right frontal, temporal, and parietal lobes with extension into the right occipital lobe. Note is made of a patent right posterior communicating artery allowing for right occipital infarction. Acute infarct in the right lateral putamen and right tail  of caudate. Small areas of acute infarct in the superior new cerebellum bilaterally and in the left occipital lobe. Small acute infarct left pons.  Mild mass-effect related to cerebral edema. Minimal shift of the midline structures measuring 1 mm. Mild hemorrhage in the right frontal lobe acute infarct. Scattered areas of chronic micro hemorrhage are present bilaterally likely related to chronic hypertension.  Chronic infarct in the centrum semiovale on the left. Chronic microvascular ischemic changes in the white matter. Mild chronic ischemia in the pons. No underlying mass lesion. Ventricle size is normal.  MRA HEAD FINDINGS  Right vertebral dominant. Right vertebral widely patent to the basilar. Left vertebral artery is hypoplastic after the PICA. Right PICA not visualized. Large right AICA patent. Basilar patent. Superior cerebellar and posterior cerebral arteries patent bilaterally. Fetal origin of the posterior cerebral artery bilaterally which is patent bilaterally.  Internal carotid artery widely patent bilaterally. Anterior and middle cerebral arteries widely patent. No branch occlusion or stenosis. Right M1 segment appears normal post right MCA clot retrieval  Negative for cerebral aneurysm.  IMPRESSION: Large territory acute right MCA infarct with mild hemorrhage in the right frontal infarct. Acute infarct also extends into the right PCA territory related to fetal origin of the right posterior cerebral artery. Smaller acute infarcts also noted in the cerebellum bilaterally, left occipital lobe, and left anterior pons.  Mild chronic microvascular ischemia. Mild chronic micro hemorrhage in the brain related to chronic hypertension  MRA and circle Willis is normal. Right MCA appears normal. Fetal origin of the posterior cerebral artery bilaterally.   Electronically Signed   By: Franchot Gallo M.D.   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    NEUROLOGIC A:  Large R MCA teritory CVA Status post re-canalization 5/24   - still with poor mental status. RASS -4 on diprivan P:   RASS goal:-1 Mgmt per Stroke team Hypertonic saline 5/26 >    Updates: wife, step-daughter, and bio-daughter updated. Dr Erlinda Hong had extensive conversation with them    The patient is critically ill with multiple organ systems failure and requires high complexity decision making for assessment and support, frequent evaluation and titration of therapies, application of advanced monitoring technologies and extensive interpretation of multiple databases.   Critical Care Time devoted to patient care services described in this note is  30  Minutes. This time reflects time of care of this signee Dr Brand Males. This critical care time does not reflect procedure time, or teaching time or supervisory time of PA/NP/Med student/Med Resident etc but could involve care discussion time    Dr. Brand Males, M.D., [facility].C.P Pulmonary and Critical Care Medicine Staff Physician [facility] Pager: [phone], If no answer or between  15:00h - 7:00h: call [phone]  03/29/2015 9:59 AM

## 2015-03-29 NOTE — Progress Notes (Signed)
    Subjective  -   Remains intubated and sedated   Physical Exam:  Patient did open his eyes slightly to verbal stimulus.  Would not follow commands.  Did not appear to be moving either extremity.       Assessment/Plan:    I spoke with his wife and 2 daughters.  They appear to have a good grasp of the situation.  They understand the nature of his illness and potential outcomes.  The plan is for CT scan tomorrow.  They do have a copy of his living will with them.  Dr. Bridgett Larsson will follow-up for me over the weekend  [name] 03/29/2015 12:57 PM --  Filed Vitals:   03/29/15 1200  BP:   Pulse:   Temp: 99.9 F (37.7 C)  Resp:     Intake/Output Summary (Last 24 hours) at 03/29/15 1257 Last data filed at 03/29/15 1100  Gross per 24 hour  Intake 2246.87 ml  Output   1050 ml  Net 1196.87 ml     Laboratory CBC    Component Value Date/Time   WBC 12.6* 03/29/2015 0250   HGB 6.1* 03/29/2015 0250   HCT 18.8* 03/29/2015 0250   PLT 174 03/29/2015 0250    BMET    Component Value Date/Time   NA 157* 03/29/2015 1030   K 3.9 03/29/2015 0150   CL 115* 03/29/2015 0150   CO2 27 03/29/2015 0150   GLUCOSE 134* 03/29/2015 0150   GLUCOSE 131* 10/13/2006 0754   BUN 55* 03/29/2015 0150   CREATININE 2.26* 03/29/2015 0150   CALCIUM 7.9* 03/29/2015 0150   CALCIUM 9.6 03/23/2013 0743   GFRNONAA 27* 03/29/2015 0150   GFRAA 31* 03/29/2015 0150    COAG Lab Results  Component Value Date   INR 1.12 03/18/2015   INR 0.99 08/01/2014   INR 1.27 03/15/2013   No results found for: PTT  Antibiotics Anti-infectives    None       [name], M.D. [facility] Office: [phone] Pager:  [phone]

## 2015-03-29 NOTE — Progress Notes (Signed)
PT Cancellation Note  Patient Details Name: GAIGE SEBO MRN: [id number] DOB: Nov 27, 1940   Cancelled Treatment:    Reason Eval/Treat Not Completed: Patient not medically ready.  Vented and interactions not purposeful.  Will evaluate when able. 03/29/2015  Donnella Sham, [place] [phone]  (pager)   Jakarri Lesko, Tessie Fass 03/29/2015, 4:16 PM

## 2015-03-29 NOTE — Progress Notes (Signed)
eLink Physician-Brief Progress Note Patient Name: [name] DOB: February 08, 1941 MRN: [id number]   Date of Service  03/29/2015  HPI/Events of Note  Acute blood loss anemia with drop in Hgb from 7.3 to 6.1  eICU Interventions  Plan: Transfuse 1 unit pRBC Post-transfusion CBC     Intervention Category Intermediate Interventions: Other:  Nilson Tabora 03/29/2015, 4:02 AM

## 2015-03-29 NOTE — Progress Notes (Signed)
STROKE TEAM PROGRESS NOTE   SUBJECTIVE (INTERVAL HISTORY) His wife and daughter are at the bedside.  He is still intubated on sedation. He open eyes on voice but not following commands. Hemoglobin down to 6.1, received 1 unit PRBC. Sodium 153, on the goal. No frank GI bleeding.  OBJECTIVE Temp:  [99.5 F (37.5 C)-101.1 F (38.4 C)] 99.9 F (37.7 C) (05/27 1200) Pulse Rate:  [77-120] 86 (05/27 1400) Cardiac Rhythm:  [-] Sinus tachycardia;Normal sinus rhythm (05/27 0800) Resp:  [15-32] 18 (05/27 1400) BP: (128-201)/(55-85) 160/63 mmHg (05/27 1400) SpO2:  [98 %-100 %] 100 % (05/27 1400) FiO2 (%):  [40 %] 40 % (05/27 1400) Weight:  [145 lb 8.1 oz (66 kg)] 145 lb 8.1 oz (66 kg) (05/27 0500)   Recent Labs Lab 03/28/15 2014 03/28/15 2337 03/29/15 0324 03/29/15 0804 03/29/15 1158  GLUCAP 140* 144* 133* 148* 134*    Recent Labs Lab 03/23/2015 0609 03/27/15 0546 03/28/15 0234 03/28/15 2037 03/29/15 0150 03/29/15 1030  NA 144 140 146* 152* 153* 157*  K 3.2* 4.2 3.1*  --  3.9  --   CL 114* 120* 114*  --  115*  --   CO2  --  10* 22  --  27  --   GLUCOSE 94 114* 143*  --  134*  --   BUN 57* 52* 55*  --  55*  --   CREATININE 2.40* 2.41* 2.40*  --  2.26*  --   CALCIUM  --  8.2* 8.1*  --  7.9*  --    No results for input(s): AST, ALT, ALKPHOS, BILITOT, PROT, ALBUMIN in the last 168 hours.  Recent Labs Lab 03/04/2015 2116 03/27/15 0546 03/28/15 0234 03/29/15 0150 03/29/15 0250  WBC 14.3* 16.5* 13.8* 12.6* 12.6*  NEUTROABS  --  15.0*  --   --   --   HGB 8.7* 8.5* 7.3* 6.0* 6.1*  HCT 25.8* 25.6* 21.7* 18.5* 18.8*  MCV 92.5 93.4 93.5 94.9 94.9  PLT 195 201 183 169 174   No results for input(s): CKTOTAL, CKMB, CKMBINDEX, TROPONINI in the last 168 hours. No results for input(s): LABPROT, INR in the last 72 hours.  Recent Labs  03/28/15 1840  COLORURINE YELLOW  LABSPEC 1.020  PHURINE 5.0  GLUCOSEU NEGATIVE  HGBUR NEGATIVE  BILIRUBINUR NEGATIVE  KETONESUR NEGATIVE   PROTEINUR 30*  UROBILINOGEN 0.2  NITRITE NEGATIVE  LEUKOCYTESUR NEGATIVE       Component Value Date/Time   CHOL 113 03/19/2015 0340   TRIG 249* 03/27/2015 0546   TRIG 135 10/13/2006 0754   HDL 24* 03/19/2015 0340   CHOLHDL 4.7 03/19/2015 0340   CHOLHDL 4.6 CALC 10/13/2006 0754   VLDL 49* 03/19/2015 0340   LDLCALC 40 03/19/2015 0340   Lab Results  Component Value Date   HGBA1C 5.3 03/19/2015      Component Value Date/Time   LABOPIA NONE DETECTED 03/19/2015 0329   COCAINSCRNUR NONE DETECTED 03/19/2015 0329   LABBENZ POSITIVE* 03/19/2015 0329   AMPHETMU NONE DETECTED 03/19/2015 0329   THCU NONE DETECTED 03/19/2015 0329   LABBARB NONE DETECTED 03/19/2015 0329    No results for input(s): ETH in the last 168 hours.  I have personally reviewed the radiological images below and agree with the radiology interpretations.  Ct Head Wo Contrast 03/28/15 Large territory right MCA infarct shows mild progression in edema since yesterday. Mild midline shift shows slight progression now measuring 2.5 mm. Improvement high attenuation blood/ contrast in the right sylvian fissure. Small  amount of blood in the lateral ventricle.  03/31/2015   IMPRESSION: Large territory right MCA infarct. High-density within the right sylvian fissure most likely is contrast extravasation possibly with some hemorrhage, post right MCA clot retrieval and tPA.   Ct Head Wo Contrast  03/18/2015   IMPRESSION: Atrophy with small vessel chronic ischemic changes of deep cerebral white matter.  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  No acute intracranial abnormalities.   Mri and Mra Brain Without Contrast  03/19/2015    IMPRESSION: MRI HEAD: No acute intracranial process, specifically no acute ischemia.  Involutional changes. Remote LEFT corona radiata lacunar infarct. Moderate to severe white matter changes suggest chronic small vessel ischemic disease.  Scattered tiny foci of susceptibility  artifact can be seen with chronic hypertension though are nonspecific.  MRA HEAD: No acute large vessel occlusion or high-grade stenosis. Complete circle of Willis.  Mild luminal regularity of the proximal cerebral arteries most consistent with atherosclerosis.    Portable Chest Xray  03/27/2015    IMPRESSION: No active disease.  Endotracheal tube with tip 5.9 cm above the carina.    2D Echocardiogram  - Left ventricle: The cavity size was normal. Wall thickness was increased in a pattern of mild LVH. The estimated ejection fraction was 55%. Wall motion was normal; there were no regional wall motion abnormalities. - Left atrium: The atrium was mildly dilated. - Impressions: No cardiac source of embolism was identified, but cannot be ruled out on the basis of this examination. Impressions: - No cardiac source of embolism was identified, but cannot be ruled out on the basis of this examination.  PHYSICAL EXAM  Temp:  [99.5 F (37.5 C)-101.1 F (38.4 C)] 99.9 F (37.7 C) (05/27 1200) Pulse Rate:  [77-120] 86 (05/27 1400) Resp:  [15-32] 18 (05/27 1400) BP: (128-201)/(55-85) 160/63 mmHg (05/27 1400) SpO2:  [98 %-100 %] 100 % (05/27 1400) FiO2 (%):  [40 %] 40 % (05/27 1400) Weight:  [145 lb 8.1 oz (66 kg)] 145 lb 8.1 oz (66 kg) (05/27 0500)  General - Well nourished, well developed, still intubated and on sedation.  Ophthalmologic - fundi not visualized due to eye movement.  Cardiovascular - Regular rate and rhythm.  Neuro - intubated and on sedation, eyes open on voice, doll's eyes present, pupil equal reactive to light, left corneal reflex absent, right corneal reflex present, weak cough reflex present. Breathing over the vent, on pain stimulation, right upper extremity able to against gravity, left upper extremity 0/5, bilateral lower extremity withdrawal to pain 2+/5. Reflex 1+, left Babinski positive. Sensation, coordination and gait not tested.  ASSESSMENT/PLAN [name] is a 74 y.o. male with history of hypertension, hyperlipidemia, diabetes mellitus, COPD, coronary artery disease, peripheral vascular disease and TIA due to left ICA high grade stenosis admitted for right MCA stroke during left CEA procedure. Patient was intubated, as status post IV TPA and right MCA mechanical thrombectomy with re-cannulization. However, MRI showed right complete MCA infarct.    Stroke:  Non-dominant right large MCA malignant infarct s/p IV tPA and mechanical thrombectomy with recannulization, embolic secondary to procedure related.   MRI right large MCA malignant infarct  MRA  Right MCA patent with right hemisphere luxury perfusion  Repeat CT showed right hemisphere cerebral edema without significant midline shift  Carotid Doppler 03/19/15 - left ICA > 80% stenosis  2D Echo  unremarkable  LDL 40  HgbA1c 5.3  SCDs for VTE prophylaxis  aspirin 81 mg orally  every day and clopidogrel 75 mg orally every day prior to admission, now on no antithrombotic due to [facility] and IVH post procedure.   Discussed with wife, daughter, and son regarding patient diagnosis, treatment plan and likely prognosis. Likely poor prognosis at this time.   SAH and IVH post procedure  CT showed right sylvian fissure SAH and small amount of IVH  Repeat CT showed improvement of SAH   Likely related to thrombectomy  Hold off antiplatelets for now  Cerebral edema  Mild midline shift  Repeat CT showed minimal midline shift  Started on 3% saline   Sodium goal 150-160  Not a candidate for hemicraniectomy based on age and medical condition  Discussed with wife and family and they understand  Carotid stenosis  Left ICA stenosis > 80% at bulb  Avoid hypotension  BP goal 130-160   Hypertension  Home meds: Clonidine, labetalol  Currently no BP meds  BP at high side, put on cleviprex  Avoid hypotension  BP goal 130-160   Anemia  Hb 6.1  Received PRBC  transfusion  CCM on board  Close monitoring  Hyperlipidemia  Home meds: lipitor 10, resumed in hospital  LDL 40, goal < 70  Continue statin at discharge  DM  A1C 5.5  Under control  Other Stroke Risk Factors  Advanced age  Cigarette smoker, quit smoking 10 months ago , re-advised to stop smoking  Coronary artery disease - MI, PCI s/ stent 1998  PVD  Other Active Problems  Anxiety  COPD w/ emphysema  GERD  Barrett's esophagus  Depression  AAA repair 2010   CKD stage 3  hypokalemia  Other Pertinent History  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 and off sedation today to better evaluate neuro status. Family is going to make decision on Monday.  Hospital day # 3  This patient is critically ill due to large right MCA stroke, subarachnoid hemorrhage, left ICA stenosis and at significant risk of neurological worsening, death form hemorrhagic transformation, cerebral edema, brain herniation, seizure, recurrent stroke. This patient's care requires constant monitoring of vital signs, hemodynamics, respiratory and cardiac monitoring, review of multiple databases, neurological assessment, discussion with family, other specialists and medical decision making of high complexity. I spent 40 minutes of neurocritical care time in the care of this patient.   Rosalin Hawking, MD PhD Stroke Neurology 03/29/2015 2:21 PM    To contact Stroke Continuity provider, please refer to [email or web address] After hours, contact General Neurology

## 2015-03-29 NOTE — Clinical Documentation Improvement (Signed)
03/28/15 Nutrition Eval noted.Marland KitchenMarland Kitchen"Severe malnutrition in context of chronic illness"..."NUTRITION DIAGNOSIS: Malnutrition related to chronic illness as evidenced by severe depletion of muscle mass and at least 13% weight loss x 5 months.".Marland KitchenMarland KitchenINTERVENTION: D/C Vital High Protein. Start Vital AF 1.2 @ 60 ml/hr. Provides: 1728 kcal (102% of needs), 108 grams protein, and 1167 ml H2O.".Marland KitchenMarland KitchenMONITOR: Vent status, Labs, Weight trends, TF tolerance.".Marland KitchenMarland Kitchen  For accurate Dx specificity & severity can noted nutrition assessment be further validated for cond being eval'd, mon'd & tx'd. Thank you  Possible Clinical Conditions? Severe Malnutrition   Protein Calorie Malnutrition Severe Protein Calorie Malnutrition Emaciation  Cachexia   Other Condition Cannot clinically determine  Supporting Information: See above noted and full nutrition eval 03/28/15 Risk Factors: Signs & Symptoms: Diagnostics: Treatment:  Thank You, Ermelinda Das, RN, BSN, CCDS Certified Clinical Documentation Specialist Pager: [place]: Health Information Management

## 2015-03-29 NOTE — Progress Notes (Signed)
CRITICAL VALUE ALERT  Critical value received: Na+ 162  Date of notification:  03/29/15  Time of notification:  2100  Critical value read back yes  Nurse who received alert:  [place]  MD notified (1st page):  Neurology  Time of first page: 2115  MD notified (2nd page):  Time of second page:  Responding MD:    Time MD responded:

## 2015-03-29 NOTE — Progress Notes (Signed)
PT Cancellation Note  Patient Details Name: KHOLTON COATE MRN: [id number] DOB: 28-May-1941   Cancelled Treatment:    Reason Eval/Treat Not Completed: Patient not medically ready. Vented, interventions not purposeful.  Will try back as able this p.m.  Otherwise will evaluate as able 5/28. 03/29/2015  Donnella Sham, [place] [phone]  (pager)   Ronya Gilcrest, Tessie Fass 03/29/2015, 10:15 AM

## 2015-03-29 NOTE — Progress Notes (Signed)
OT Cancellation Note  Patient Details Name: [name] MRN: [id number] DOB: 1941/05/11   Cancelled Treatment:    Reason Eval/Treat Not Completed: Patient not medically ready (active bedrest orders.) Intubated/sedated.  [place], OTR/L  [phone] 03/29/2015 03/29/2015, 9:18 AM

## 2015-03-30 ENCOUNTER — Inpatient Hospital Stay (HOSPITAL_COMMUNITY): Payer: Medicare Other

## 2015-03-30 DIAGNOSIS — E1159 Type 2 diabetes mellitus with other circulatory complications: Secondary | ICD-10-CM

## 2015-03-30 LAB — CBC WITH DIFFERENTIAL/PLATELET
Basophils Absolute: 0 10*3/uL (ref 0.0–0.1)
Basophils Relative: 0 % (ref 0–1)
EOS PCT: 0 % (ref 0–5)
Eosinophils Absolute: 0 10*3/uL (ref 0.0–0.7)
HCT: 25.1 % — ABNORMAL LOW (ref 39.0–52.0)
Hemoglobin: 8.1 g/dL — ABNORMAL LOW (ref 13.0–17.0)
LYMPHS ABS: 0.5 10*3/uL — AB (ref 0.7–4.0)
LYMPHS PCT: 3 % — AB (ref 12–46)
MCH: 31.4 pg (ref 26.0–34.0)
MCHC: 32.3 g/dL (ref 30.0–36.0)
MCV: 97.3 fL (ref 78.0–100.0)
MONO ABS: 1.6 10*3/uL — AB (ref 0.1–1.0)
Monocytes Relative: 10 % (ref 3–12)
Neutro Abs: 13.7 10*3/uL — ABNORMAL HIGH (ref 1.7–7.7)
Neutrophils Relative %: 87 % — ABNORMAL HIGH (ref 43–77)
Platelets: 196 10*3/uL (ref 150–400)
RBC: 2.58 MIL/uL — AB (ref 4.22–5.81)
RDW: 16.7 % — AB (ref 11.5–15.5)
WBC: 15.8 10*3/uL — ABNORMAL HIGH (ref 4.0–10.5)

## 2015-03-30 LAB — TYPE AND SCREEN
ABO/RH(D): A POS
Antibody Screen: NEGATIVE
Unit division: 0

## 2015-03-30 LAB — SODIUM
SODIUM: 163 mmol/L — AB (ref 135–145)
SODIUM: 165 mmol/L — AB (ref 135–145)
SODIUM: 167 mmol/L — AB (ref 135–145)
Sodium: 164 mmol/L (ref 135–145)

## 2015-03-30 LAB — GLUCOSE, CAPILLARY
GLUCOSE-CAPILLARY: 127 mg/dL — AB (ref 65–99)
Glucose-Capillary: 134 mg/dL — ABNORMAL HIGH (ref 65–99)
Glucose-Capillary: 123 mg/dL — ABNORMAL HIGH (ref 65–99)
Glucose-Capillary: 104 mg/dL — ABNORMAL HIGH (ref 65–99)
Glucose-Capillary: 137 mg/dL — ABNORMAL HIGH (ref 65–99)
Glucose-Capillary: 120 mg/dL — ABNORMAL HIGH (ref 65–99)
Glucose-Capillary: 139 mg/dL — ABNORMAL HIGH (ref 65–99)

## 2015-03-30 LAB — HEMOGLOBIN A1C
Hgb A1c MFr Bld: 5.5 % (ref 4.8–5.6)
MEAN PLASMA GLUCOSE: 111 mg/dL

## 2015-03-30 LAB — BASIC METABOLIC PANEL
ANION GAP: 10 (ref 5–15)
BUN: 59 mg/dL — ABNORMAL HIGH (ref 6–20)
CHLORIDE: 125 mmol/L — AB (ref 101–111)
CO2: 26 mmol/L (ref 22–32)
Calcium: 8.6 mg/dL — ABNORMAL LOW (ref 8.9–10.3)
Creatinine, Ser: 2.27 mg/dL — ABNORMAL HIGH (ref 0.61–1.24)
GFR calc Af Amer: 31 mL/min — ABNORMAL LOW (ref >60)
GFR calc non Af Amer: 27 mL/min — ABNORMAL LOW (ref >60)
Glucose, Bld: 120 mg/dL — ABNORMAL HIGH (ref 65–99)
POTASSIUM: 3.7 mmol/L (ref 3.5–5.1)
Sodium: 161 mmol/L (ref 135–145)

## 2015-03-30 LAB — MAGNESIUM: Magnesium: 2.1 mg/dL (ref 1.7–2.4)

## 2015-03-30 LAB — PHOSPHORUS: Phosphorus: 2.7 mg/dL (ref 2.5–4.6)

## 2015-03-30 MED ORDER — VITAL AF 1.2 CAL PO LIQD
1000.0000 mL | ORAL | Status: DC
  Administered 2015-03-30 – 2015-03-31 (×2): 1000 mL
  Filled 2015-03-30 (×5): qty 1000

## 2015-03-30 MED ORDER — FREE WATER
150.0000 mL | Freq: Every day | Status: DC
  Administered 2015-03-30 – 2015-04-01 (×9): 150 mL

## 2015-03-30 NOTE — Progress Notes (Signed)
Discontinued sodium bicarb tablets per Dr. Irish Elders due to elevated sodium levels. Can be restarted at a later time when appropriate.

## 2015-03-30 NOTE — Progress Notes (Signed)
Spoke with Levert Feinstein, PA about a critical Na level of 164. Pt has scheduled 1300 mg Na Bicarb tablets, he instructed me to hold these at this time until neurology rounds and determines the care of plan to reduce his Na levels.  Pt was not receiving hypertonic 3% saline when I took over care this am. PM nurse stopped it on her shift last night.   Prabhav Faulkenberry GARNER

## 2015-03-30 NOTE — Progress Notes (Signed)
PT Cancellation Note  Patient Details Name: [name] MRN: [id number] DOB: [birth year]   Cancelled Treatment:    Reason Eval/Treat Not Completed: Patient not medically ready.  Vented, no purposeful interactions. 03/30/2015  Donnella Sham, [place] [phone]  (pager)   Luigi Stuckey, Tessie Fass 03/30/2015, 2:03 PM

## 2015-03-30 NOTE — Progress Notes (Signed)
CRITICAL VALUE ALERT  Critical value received:  Na 164  Date of notification:  03/30/2015  Time of notification:  0914  Critical value read back:Yes.    Nurse who received alert:  Vita Barley, RN  MD notified (1st page):  Levert Feinstein, PA  Time of first page:  [phone]  MD notified (2nd page):    Time of second page:  Responding MD:  Levert Feinstein, PA  Time MD responded:  [phone]

## 2015-03-30 NOTE — Progress Notes (Signed)
Daily Progress Note   Assessment/Planning: POD #4 s/p Arch aortogram complicated by R MCA CVA   Appreciate Neuro mgmt of this patient  Nothing more to add at this point  Will continue to follow along  Subjective  - 4 Days Post-Op  Eye open but minimal responsive  Objective Filed Vitals:   03/30/15 0800 03/30/15 0840 03/30/15 0900 03/30/15 1000  BP: 156/66 154/69 158/67 161/75  Pulse: 108 108 106 110  Temp: 98.5 F (36.9 C)     TempSrc: Axillary     Resp: 20  15 18   Height:      Weight:      SpO2: 100%  100% 100%    Intake/Output Summary (Last 24 hours) at 03/30/15 1021 Last data filed at 03/30/15 1000  Gross per 24 hour  Intake 1037.81 ml  Output   1125 ml  Net -87.19 ml    NEURO   minimally response, eye opening  Radiology: Ct Head Wo Contrast  03/30/2015   CLINICAL DATA:  Continued surveillance cerebral infarctions. Patient not following commands.  EXAM: CT HEAD WITHOUT CONTRAST  TECHNIQUE: Contiguous axial images were obtained from the base of the skull through the vertex without intravenous contrast.  COMPARISON:  Multiple priors, most recent CT 03/28/2015.  FINDINGS: Increasingly well-defined cytotoxic edema throughout the RIGHT hemisphere affecting the RIGHT MCA and PCA territories consistent with recent RIGHT hemisphere vascular occlusion. Parenchymal, subarachnoid, and intraventricular hemorrhage persists without significant worsening. 4 mm RIGHT-to-LEFT shift is minimally increased from prior scan. RIGHT greater than LEFT cerebellar infarcts are redemonstrated, without significant posterior fossa edema.  No acute sinus or mastoid disease.  IMPRESSION: Increasing well-defined cytotoxic edema throughout the RIGHT hemisphere. Slight increase RIGHT-to-LEFT shift 4 mm.  Unchanged cerebellar infarcts.  Parenchymal, subarachnoid, and intraventricular hemorrhage without significant worsening.   Electronically Signed   By: Rolla Flatten M.D.   On: 03/30/2015 07:37   Dg  Chest Port 1 View  03/30/2015   CLINICAL DATA:  Intubated patient.  Follow-up exam.  EXAM: PORTABLE CHEST - 1 VIEW  COMPARISON:  03/29/2015  FINDINGS: Lungs remain hyperexpanded. No evidence of pneumonia or pulmonary edema. No pleural effusion or pneumothorax.  Endotracheal tube tip projects 4.9 cm above the carina.  Orogastric tube passes below the diaphragm well into stomach. Left subclavian central venous line tip projects in the mid to lower superior vena cava.  IMPRESSION: 1. No significant change from the previous day's study. 2. No convincing pneumonia or pulmonary edema. Hyperexpanded lungs suggests COPD. 3. Support apparatus is stable and well positioned.   Electronically Signed   By: Lajean Manes M.D.   On: 03/30/2015 09:27   Dg Chest Port 1 View  03/29/2015   CLINICAL DATA:  Hypoxia  EXAM: PORTABLE CHEST - 1 VIEW  COMPARISON:  Mar 28, 2015  FINDINGS: Endotracheal tube tip is 2.2 cm above the carina. Central catheter tip is in the superior vena cava. Nasogastric tube tip and side port are in the stomach. No pneumothorax. There is no edema or consolidation. Heart size and pulmonary vascularity are normal. No adenopathy.  IMPRESSION: Tube and catheter positions as described without pneumothorax. No edema or consolidation.   Electronically Signed   By: Lowella Grip III M.D.   On: 03/29/2015 07:42   Dg Chest Port 1 View  03/28/2015   CLINICAL DATA:  Central line placement  EXAM: PORTABLE CHEST - 1 VIEW  COMPARISON:  03/28/2015  FINDINGS: Cardiomediastinal silhouette is stable. Stable endotracheal and NG tube  position. There is left subclavian central line with tip in SVC. No pneumothorax. No acute infiltrate or pulmonary edema.  IMPRESSION: No active disease. Left subclavian central line with tip in SVC. No pneumothorax. Stable endotracheal and NG tube position.   Electronically Signed   By: Lahoma Crocker M.D.   On: 03/28/2015 11:39    Laboratory CBC    Component Value Date/Time   WBC 15.8*  03/30/2015 0050   HGB 8.1* 03/30/2015 0050   HCT 25.1* 03/30/2015 0050   PLT 196 03/30/2015 0050    BMET    Component Value Date/Time   NA 164* 03/30/2015 0828   K 3.7 03/30/2015 0050   CL 125* 03/30/2015 0050   CO2 26 03/30/2015 0050   GLUCOSE 120* 03/30/2015 0050   GLUCOSE 131* 10/13/2006 0754   BUN 59* 03/30/2015 0050   CREATININE 2.27* 03/30/2015 0050   CALCIUM 8.6* 03/30/2015 0050   CALCIUM 9.6 03/23/2013 0743   GFRNONAA 27* 03/30/2015 0050   GFRAA 31* 03/30/2015 0050    Adele Barthel, MD [facility] Office: [phone] Pager: [phone]  03/30/2015, 10:21 AM

## 2015-03-30 NOTE — Progress Notes (Signed)
CRITICAL VALUE ALERT  Critical value received:  Na 165   Date of notification:  03/30/2015  Time of notification:  2115  Critical value read back:Yes.    Nurse who received alert:  Tereasa Coop, RN  MD notified (1st page):  Dr. Doy Mince   Time of first page:  2120  MD notified (2nd page):  Time of second page:  Responding MD:  Dr. Doy Mince   Time MD responded:  2127

## 2015-03-30 NOTE — Progress Notes (Signed)
STROKE TEAM PROGRESS NOTE   SUBJECTIVE (INTERVAL HISTORY) His wife and daughter are at the bedside.  He is still intubated on sedation. He open eyes on voice but not following commands. Hemoglobin down to 6.1, received 1 unit PRBC. Sodium 153, on the goal. No frank GI bleeding.  Hypernatremia at 164. Hypertonic saline held. Tube feeds restarted.    OBJECTIVE Temp:  [98.2 F (36.8 C)-99.9 F (37.7 C)] 98.2 F (36.8 C) (05/28 1153) Pulse Rate:  [83-111] 110 (05/28 1142) Cardiac Rhythm:  [-] Sinus tachycardia;Normal sinus rhythm (05/28 0800) Resp:  [15-20] 17 (05/28 1142) BP: (135-181)/(61-75) 155/69 mmHg (05/28 1142) SpO2:  [98 %-100 %] 100 % (05/28 1000) FiO2 (%):  [40 %] 40 % (05/28 1142) Weight:  [63.6 kg (140 lb 3.4 oz)] 63.6 kg (140 lb 3.4 oz) (05/28 0400)   Recent Labs Lab 03/29/15 1527 03/29/15 2001 03/29/15 2335 03/30/15 0337 03/30/15 0756  GLUCAP 121* 146* 127* 134* 123*    Recent Labs Lab 03/04/2015 1962  03/27/15 0546 03/28/15 0234  03/29/15 0150 03/29/15 1030 03/29/15 2010 03/30/15 0050 03/30/15 0828  NA 144  --  140 146*  < > 153* 157* 162* 161*  163* 164*  K 3.2*  --  4.2 3.1*  --  3.9  --   --  3.7  --   CL 114*  --  120* 114*  --  115*  --   --  125*  --   CO2  --   --  10* 22  --  27  --   --  26  --   GLUCOSE 94  --  114* 143*  --  134*  --   --  120*  --   BUN 57*  --  52* 55*  --  55*  --   --  59*  --   CREATININE 2.40*  --  2.41* 2.40*  --  2.26*  --   --  2.27*  --   CALCIUM  --   < > 8.2* 8.1*  --  7.9*  --   --  8.6*  --   MG  --   --   --   --   --   --   --   --  2.1  --   PHOS  --   --   --   --   --   --   --   --  2.7  --   < > = values in this interval not displayed. No results for input(s): AST, ALT, ALKPHOS, BILITOT, PROT, ALBUMIN in the last 168 hours.  Recent Labs Lab 03/27/15 0546 03/28/15 0234 03/29/15 0150 03/29/15 0250 03/29/15 1500 03/30/15 0050  WBC 16.5* 13.8* 12.6* 12.6* 13.4* 15.8*  NEUTROABS 15.0*  --   --   --    --  13.7*  HGB 8.5* 7.3* 6.0* 6.1* 7.6* 8.1*  HCT 25.6* 21.7* 18.5* 18.8* 23.4* 25.1*  MCV 93.4 93.5 94.9 94.9 95.5 97.3  PLT 201 183 169 174 167 196   No results for input(s): CKTOTAL, CKMB, CKMBINDEX, TROPONINI in the last 168 hours. No results for input(s): LABPROT, INR in the last 72 hours.  Recent Labs  03/28/15 1840  COLORURINE YELLOW  LABSPEC 1.020  PHURINE 5.0  GLUCOSEU NEGATIVE  HGBUR NEGATIVE  BILIRUBINUR NEGATIVE  KETONESUR NEGATIVE  PROTEINUR 30*  UROBILINOGEN 0.2  NITRITE NEGATIVE  LEUKOCYTESUR NEGATIVE       Component Value Date/Time   CHOL 113  03/19/2015 0340   TRIG 166* 03/29/2015 1608   TRIG 135 10/13/2006 0754   HDL 24* 03/19/2015 0340   CHOLHDL 4.7 03/19/2015 0340   CHOLHDL 4.6 CALC 10/13/2006 0754   VLDL 49* 03/19/2015 0340   LDLCALC 40 03/19/2015 0340   Lab Results  Component Value Date   HGBA1C 5.5 03/27/2015      Component Value Date/Time   LABOPIA NONE DETECTED 03/19/2015 0329   COCAINSCRNUR NONE DETECTED 03/19/2015 0329   LABBENZ POSITIVE* 03/19/2015 0329   AMPHETMU NONE DETECTED 03/19/2015 0329   THCU NONE DETECTED 03/19/2015 0329   LABBARB NONE DETECTED 03/19/2015 0329    No results for input(s): ETH in the last 168 hours.  I have personally reviewed the radiological images below and agree with the radiology interpretations.  Ct Head Wo Contrast 03/28/15 Large territory right MCA infarct shows mild progression in edema since yesterday. Mild midline shift shows slight progression now measuring 2.5 mm. Improvement high attenuation blood/ contrast in the right sylvian fissure. Small amount of blood in the lateral ventricle.  03/23/2015   IMPRESSION: Large territory right MCA infarct. High-density within the right sylvian fissure most likely is contrast extravasation possibly with some hemorrhage, post right MCA clot retrieval and tPA.   Ct Head Wo Contrast  03/18/2015   IMPRESSION: Atrophy with small vessel chronic ischemic changes  of deep cerebral white matter.  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  No acute intracranial abnormalities.   Mri and Mra Brain Without Contrast  03/19/2015    IMPRESSION: MRI HEAD: No acute intracranial process, specifically no acute ischemia.  Involutional changes. Remote LEFT corona radiata lacunar infarct. Moderate to severe white matter changes suggest chronic small vessel ischemic disease.  Scattered tiny foci of susceptibility artifact can be seen with chronic hypertension though are nonspecific.  MRA HEAD: No acute large vessel occlusion or high-grade stenosis. Complete circle of Willis.  Mild luminal regularity of the proximal cerebral arteries most consistent with atherosclerosis.    Portable Chest Xray  03/27/2015    IMPRESSION: No active disease.  Endotracheal tube with tip 5.9 cm above the carina.    2D Echocardiogram  - Left ventricle: The cavity size was normal. Wall thickness was increased in a pattern of mild LVH. The estimated ejection fraction was 55%. Wall motion was normal; there were no regional wall motion abnormalities. - Left atrium: The atrium was mildly dilated. - Impressions: No cardiac source of embolism was identified, but cannot be ruled out on the basis of this examination. Impressions: - No cardiac source of embolism was identified, but cannot be ruled out on the basis of this examination.  PHYSICAL EXAM  Temp:  [98.2 F (36.8 C)-99.9 F (37.7 C)] 98.2 F (36.8 C) (05/28 1153) Pulse Rate:  [83-111] 110 (05/28 1142) Resp:  [15-20] 17 (05/28 1142) BP: (135-181)/(61-75) 155/69 mmHg (05/28 1142) SpO2:  [98 %-100 %] 100 % (05/28 1000) FiO2 (%):  [40 %] 40 % (05/28 1142) Weight:  [63.6 kg (140 lb 3.4 oz)] 63.6 kg (140 lb 3.4 oz) (05/28 0400)  General - Well nourished, well developed, still intubated and on sedation.  Ophthalmologic - fundi not visualized due to eye movement.  Cardiovascular - Regular rate and  rhythm.  Neuro - intubated and on sedation, eyes open on voice, doll's eyes present, pupil equal reactive to light, left corneal reflex absent, right corneal reflex present, weak cough reflex present. Breathing over the vent, on pain stimulation, right upper extremity able to against  gravity, left upper extremity 0/5, bilateral lower extremity withdrawal to pain 2+/5. Reflex 1+, left Babinski positive. Sensation, coordination and gait not tested.  ASSESSMENT/PLAN Mr. RIDDICK NUON is a 74 y.o. male with history of hypertension, hyperlipidemia, diabetes mellitus, COPD, coronary artery disease, peripheral vascular disease and TIA due to left ICA high grade stenosis admitted for right MCA stroke during left CEA procedure. Patient was intubated, as status post IV TPA and right MCA mechanical thrombectomy with re-cannulization. However, MRI showed right complete MCA infarct.    Stroke:  Non-dominant right large MCA malignant infarct s/p IV tPA and mechanical thrombectomy with recannulization, embolic secondary to procedure related.   MRI right large MCA malignant infarct  MRA  Right MCA patent with right hemisphere luxury perfusion  Repeat CT showed right hemisphere cerebral edema without significant midline shift  Carotid Doppler 03/19/15 - left ICA > 80% stenosis  2D Echo  unremarkable  LDL 40  HgbA1c 5.3  SCDs for VTE prophylaxis  aspirin 81 mg orally every day and clopidogrel 75 mg orally every day prior to admission, now on no antithrombotic due to [facility] and IVH post procedure.   Discussed with wife, daughter, and son regarding patient diagnosis, treatment plan and likely prognosis. Likely poor prognosis at this time.   SAH and IVH post procedure  CT showed right sylvian fissure SAH and small amount of IVH  Repeat CT showed improvement of SAH   Likely related to thrombectomy  Hold off antiplatelets   Cerebral edema  Mild midline shift  Repeat CT showed minimal midline  shift  Started on 3% saline --> now D/c as sodium as 164  Sodium goal 150-160  Not a candidate for hemicraniectomy based on age and medical condition  Family meeting today, please look below.   Carotid stenosis  Left ICA stenosis > 80% at bulb  Avoid hypotension  BP goal 130-160   Hypertension  Home meds: Clonidine, labetalol  Currently no BP meds  BP at high side, put on cleviprex  Avoid hypotension  BP goal 130-160   Anemia  Hb 6.1  Received PRBC transfusion  CCM on board  Close monitoring  Hyperlipidemia  Home meds: lipitor 10, resumed in hospital  LDL 40, goal < 70  Continue statin at discharge  DM  A1C 5.5  Under control  Other Stroke Risk Factors  Advanced age  Cigarette smoker, quit smoking 10 months ago , re-advised to stop smoking  Coronary artery disease - MI, PCI s/ stent 1998  PVD  Other Active Problems  Anxiety  COPD w/ emphysema  GERD  Barrett's esophagus  Depression  AAA repair 2010   CKD stage 3  hypokalemia  Other Pertinent History  Significant time spent in family meeting today.  Pt has sodium of 164, hypertonic saline held. Repeat CTH with evolution of stroke and minimal <21mm midline shift. Suspect pt is at peak of edema phase. Pt is not following commands  Family made it clear that pt has a living will.  Will not proceed to trach/peg route. Another meeting next week, if not improvement then likely one way extubation and withdrawal of care.  Pt started back on TF with free water flushes.   Repeat Sodium later today.    Hospital day # 4  This patient is critically ill due to large right MCA stroke, subarachnoid hemorrhage, left ICA stenosis and at significant risk of neurological worsening, death form hemorrhagic transformation, cerebral edema, brain herniation, seizure, recurrent stroke. This patient's care  requires constant monitoring of vital signs, hemodynamics, respiratory and cardiac monitoring,  review of multiple databases, neurological assessment, discussion with family, other specialists and medical decision making of high complexity. I spent 40 minutes of neurocritical care time in the care of this patient.   Leotis Pain  Stroke Neurology 03/30/2015 11:56 AM    To contact Stroke Continuity provider, please refer to [email or web address] After hours, contact General Neurology

## 2015-03-30 NOTE — Progress Notes (Signed)
Nutrition Follow-up DOCUMENTATION CODES: Severe malnutrition in context of chronic illness  INTERVENTION: Restart Vital AF 1.2 @ 20 ml/hr and increase by 10 ml every 4 hours to goal rate of 55 ml/hr.  Provides: 1584 kcal (99% of needs), 99 grams protein, and 1070 ml H2O.   NUTRITION DIAGNOSIS: Malnutrition related to chronic illness as evidenced by severe depletion of muscle mass, moderate depletion of body fat, percent weight loss.  GOAL: Patient will meet greater than or equal to 90% of their needs  MONITOR: Vent status, Labs, Weight trends, TF tolerance, TF to goal, Goals of care  REASON FOR ASSESSMENT: Consult Enteral/tube feeding initiation and management  ASSESSMENT: Pt with CVA while in cath lab for left ICA stent procedure 5/24. R MCA tPA and clot retrieval.  5/25: large R MCA territory infarct with hemorrhage extending to R PCA.  5/26 Pt emesis- 1700, TF held. Per rounds MD wants to continue to hold TF. Plan for CT tomorrow and then family discussion for goals of care.  5/28 C/s To restart TF  Patient is currently intubated on ventilator support MV: 9.4 L/min Temp (24hrs), Avg:98.9 F (37.2 C), Min:98.2 F (36.8 C), Max:99.7 F (37.6 C)  Labs reviewed:Hypernatremic, hyperchloremic, elevated BUN/Creat, Hyperglycemic  Height: Ht Readings from Last 1 Encounters:  03/05/2015 5\' 9"  (1.753 m)   Weight: Wt Readings from Last 1 Encounters:  03/30/15 140 lb 3.4 oz (63.6 kg)   Ideal Body Weight:  72.7 kg Wt Readings from Last 10 Encounters:  03/30/15 140 lb 3.4 oz (63.6 kg)  01/10/15 145 lb 12 oz (66.112 kg)  12/10/14 148 lb (67.132 kg)  11/12/14 152 lb (68.947 kg)  10/10/14 165 lb 12.8 oz (75.206 kg)  10/01/14 165 lb 1.6 oz (74.889 kg)  09/24/14 166 lb 11.2 oz (75.615 kg)  08/29/14 161 lb 9.6 oz (73.301 kg)  08/09/14 163 lb (73.936 kg)  07/25/14 159 lb (72.122 kg)  135 lbs (61.4kg )-dosing  BMI:  Body mass index is 20.7 kg/(m^2). BMI: 20 w/ dosing  weight  Estimated Nutritional Needs: Kcal:  1593 Protein:  92-122 (1.5-2 g Pro) Fluid:  1.7 liters Skin:  Reviewed, no issues  Diet Order:  NPO  EDUCATION NEEDS: No education needs identified at this time   Intake/Output Summary (Last 24 hours) at 03/30/15 1329 Last data filed at 03/30/15 1000  Gross per 24 hour  Intake 917.81 ml  Output    925 ml  Net  -7.19 ml    Last BM:  Unknown  Burtis Junes RD, LDN Nutrition Pager: [phone] 03/30/2015 1:29 PM

## 2015-03-30 NOTE — Progress Notes (Signed)
CRITICAL VALUE ALERT  Critical value received:  Na 167 - panic level  Date of notification:  03/30/2015  Time of notification:  3358  Critical value read back:Yes.    Nurse who received alert:  Vita Barley, RN  MD notified (1st page):  Yevonne Aline, MD  Time of first page:  1719     MD notified (2nd page):  Time of second page:  Responding MD:  P.Sumner, MD  Time MD responded:  [phone]

## 2015-03-30 NOTE — Procedures (Signed)
Pt transported by RN, RT and transporter from 3M07 to CT then back without complications.

## 2015-03-30 NOTE — Progress Notes (Signed)
PULMONARY / CRITICAL CARE MEDICINE   Name: [name] MRN: [id number] DOB: 01/12/1941    ADMISSION DATE:  03/16/2015 CONSULTATION DATE:  5/24  REFERRING MD :  Janann Colonel   CHIEF COMPLAINT:  Vent management   INITIAL PRESENTATION:  [name] is a 74 yo male with multiple medical problems. He presents 5/24 for left ICA stent procedure. During the procedure patient suffered a CT confirmed ischemic stroke with left sided flaccidity and right eye deviation. tPA was administered at 1127 on 04/01/2015 and then taken for additional intervention. Returned to ICU on full vent support. PCCM consulted for vent management.     INDWELLING DEVICES:: ETT 5/24 >>  L [place] CVL 5/26  MICRO DATA: None  ANTIMICROBIALS:  None  SIGNIFICANT EVENTS/STUDIES:  5/17: MRI/MRA brain: No acute intracranial process, specifically no acute  ischemia.Involutional changes. Remote LEFT corona radiata lacunar infarct. Moderate to severe white matter changes suggest chronic small vessel ischemic disease.Scattered tiny foci of susceptibility artifact can be seen with chronic hypertension though are nonspecific. Mild luminal regularity of the proximal cerebral arteries most consistent with atherosclerosis. 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 Procedure complicated by acute R carotid occlusion 5/24 Neuro IR procedure: tPA and mechanical clot removal from R MCA with complete recanalization. Left intubated post procedure 5/24 CT head: Large territory right MCA infarct. High-density within the right sylvian fissure most likely is contrast extravasation possibly with some hemorrhage, post right MCA clot retrieval and tPA  5/25 MRI brain: Large territory acute right MCA infarct with mild hemorrhage in the right frontal infarct. Acute infarct also extends into the right PCA territory related to fetal origin of the right posterior cerebral artery.  Smaller acute infarcts also noted in the cerebellum bilaterally, left occipital lobe, and left anterior pons  5/26 CT head: Large territory right MCA infarct shows mild progression in edema since yesterday. Mild midline shift shows slight progression now measuring 2.5 mm 5/26 hypertonic saline protocol initiated by Stroke team  SUBJECTIVE/OVERNIGHT/INTERVAL HX No events overnight, unresponsive.  VITAL SIGNS: Temp:  [98.3 F (36.8 C)-99.9 F (37.7 C)] 98.5 F (36.9 C) (05/28 0800) Pulse Rate:  [81-111] 106 (05/28 0900) Resp:  [15-20] 15 (05/28 0900) BP: (135-181)/(60-73) 158/67 mmHg (05/28 0900) SpO2:  [98 %-100 %] 100 % (05/28 0900) FiO2 (%):  [40 %] 40 % (05/28 0840) Weight:  [63.6 kg (140 lb 3.4 oz)] 63.6 kg (140 lb 3.4 oz) (05/28 0400) HEMODYNAMICS:   VENTILATOR SETTINGS: Vent Mode:  [-] PRVC FiO2 (%):  [40 %] 40 % Set Rate:  [16 bmp] 16 bmp Vt Set:  [570 mL] 570 mL PEEP:  [5 cmH20] 5 cmH20 Plateau Pressure:  [14 cmH20-16 cmH20] 14 cmH20   INTAKE / OUTPUT:  Intake/Output Summary (Last 24 hours) at 03/30/15 1000 Last data filed at 03/30/15 0600  Gross per 24 hour  Intake 810.54 ml  Output   1125 ml  Net -314.46 ml   PHYSICAL EXAMINATION: General: RASS -3, not F/C Neuro: L hemiplegia, unresponsive HEENT: NCAT, PERRL, EOM-I and MMM Cardiovascular: Reg, no M Lungs:  Clear, poor gag, intact respiratory drive Abdomen:  Soft, + bowel sounds  EXT: Warm, no edema Skin:  Intact   LABS:  PULMONARY  Recent Labs Lab 03/16/2015 0609 03/05/2015 1703 03/25/2015 2145 03/27/15 0548  PHART  --  7.184* 7.263* 7.253*  PCO2ART  --  32.9* 27.5* 22.7*  PO2ART  --  543*  245* 187*  HCO3  --  11.9* 12.0* 9.7*  TCO2 15 12.9 12.8 10.4  O2SAT  --  99.7 99.3 99.0   CBC  Recent Labs Lab 03/29/15 0250 03/29/15 1500 03/30/15 0050  HGB 6.1* 7.6* 8.1*  HCT 18.8* 23.4* 25.1*  WBC 12.6* 13.4* 15.8*  PLT 174 167 196   COAGULATION No results for input(s): INR in the last 168  hours.  CARDIAC  No results for input(s): TROPONINI in the last 168 hours. No results for input(s): PROBNP in the last 168 hours.  CHEMISTRY  Recent Labs Lab 03/19/2015 7846 03/27/15 0546 03/28/15 0234  03/29/15 0150 03/29/15 1030 03/29/15 2010 03/30/15 0050 03/30/15 0828  NA 144 140 146*  < > 153* 157* 162* 161*  163* 164*  K 3.2* 4.2 3.1*  --  3.9  --   --  3.7  --   CL 114* 120* 114*  --  115*  --   --  125*  --   CO2  --  10* 22  --  27  --   --  26  --   GLUCOSE 94 114* 143*  --  134*  --   --  120*  --   BUN 57* 52* 55*  --  55*  --   --  59*  --   CREATININE 2.40* 2.41* 2.40*  --  2.26*  --   --  2.27*  --   CALCIUM  --  8.2* 8.1*  --  7.9*  --   --  8.6*  --   MG  --   --   --   --   --   --   --  2.1  --   PHOS  --   --   --   --   --   --   --  2.7  --   < > = values in this interval not displayed. Estimated Creatinine Clearance: 26.1 mL/min (by C-G formula based on Cr of 2.27).  LIVER No results for input(s): AST, ALT, ALKPHOS, BILITOT, PROT, ALBUMIN, INR in the last 168 hours.  INFECTIOUS No results for input(s): LATICACIDVEN, PROCALCITON in the last 168 hours.  ENDOCRINE CBG (last 3)   Recent Labs  03/29/15 2335 03/30/15 0337 03/30/15 0756  GLUCAP 127* 134* 123*   IMAGING x48h  - iCt Head Wo Contrast  03/30/2015   CLINICAL DATA:  Continued surveillance cerebral infarctions. Patient not following commands.  EXAM: CT HEAD WITHOUT CONTRAST  TECHNIQUE: Contiguous axial images were obtained from the base of the skull through the vertex without intravenous contrast.  COMPARISON:  Multiple priors, most recent CT 03/28/2015.  FINDINGS: Increasingly well-defined cytotoxic edema throughout the RIGHT hemisphere affecting the RIGHT MCA and PCA territories consistent with recent RIGHT hemisphere vascular occlusion. Parenchymal, subarachnoid, and intraventricular hemorrhage persists without significant worsening. 4 mm RIGHT-to-LEFT shift is minimally increased from prior  scan. RIGHT greater than LEFT cerebellar infarcts are redemonstrated, without significant posterior fossa edema.  No acute sinus or mastoid disease.  IMPRESSION: Increasing well-defined cytotoxic edema throughout the RIGHT hemisphere. Slight increase RIGHT-to-LEFT shift 4 mm.  Unchanged cerebellar infarcts.  Parenchymal, subarachnoid, and intraventricular hemorrhage without significant worsening.   Electronically Signed   By: Rolla Flatten M.D.   On: 03/30/2015 07:37   Dg Chest Port 1 View  03/30/2015   CLINICAL DATA:  Intubated patient.  Follow-up exam.  EXAM: PORTABLE CHEST - 1 VIEW  COMPARISON:  03/29/2015  FINDINGS: Lungs remain  hyperexpanded. No evidence of pneumonia or pulmonary edema. No pleural effusion or pneumothorax.  Endotracheal tube tip projects 4.9 cm above the carina.  Orogastric tube passes below the diaphragm well into stomach. Left subclavian central venous line tip projects in the mid to lower superior vena cava.  IMPRESSION: 1. No significant change from the previous day's study. 2. No convincing pneumonia or pulmonary edema. Hyperexpanded lungs suggests COPD. 3. Support apparatus is stable and well positioned.   Electronically Signed   By: Lajean Manes M.D.   On: 03/30/2015 09:27   Dg Chest Port 1 View  03/29/2015   CLINICAL DATA:  Hypoxia  EXAM: PORTABLE CHEST - 1 VIEW  COMPARISON:  Mar 28, 2015  FINDINGS: Endotracheal tube tip is 2.2 cm above the carina. Central catheter tip is in the superior vena cava. Nasogastric tube tip and side port are in the stomach. No pneumothorax. There is no edema or consolidation. Heart size and pulmonary vascularity are normal. No adenopathy.  IMPRESSION: Tube and catheter positions as described without pneumothorax. No edema or consolidation.   Electronically Signed   By: Lowella Grip III M.D.   On: 03/29/2015 07:42   Dg Chest Port 1 View  03/28/2015   CLINICAL DATA:  Central line placement  EXAM: PORTABLE CHEST - 1 VIEW  COMPARISON:  03/28/2015   FINDINGS: Cardiomediastinal silhouette is stable. Stable endotracheal and NG tube position. There is left subclavian central line with tip in SVC. No pneumothorax. No acute infiltrate or pulmonary edema.  IMPRESSION: No active disease. Left subclavian central line with tip in SVC. No pneumothorax. Stable endotracheal and NG tube position.   Electronically Signed   By: Lahoma Crocker M.D.   On: 03/28/2015 11:39   ASSESSMENT / PLAN:  PULMONARY A: VDRF - intubated for neuro IR procedure  Remains intubated due to AMS COPD without wheezing  P:   Begin SBT trials but no extubation given mental status. Cont vent bundle. 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   NEUROLOGIC A:  Large R MCA teritory CVA Status post re-canalization 5/24   - still with poor mental status. RASS -4 on diprivan P:   RASS goal:-1 Mgmt per Stroke team Hypertonic saline 5/26 >   Updates: Family updated.  The patient is critically ill with multiple organ systems failure and requires high complexity decision making for assessment and support, frequent evaluation and titration of therapies, application of advanced monitoring technologies and extensive interpretation of multiple databases.   Critical Care Time devoted to patient care  services described in this note is  35  Minutes. This time reflects time of care of this signee Dr Jennet Maduro. This critical care time does not reflect procedure time, or teaching  time or supervisory time of PA/NP/Med student/Med Resident etc but could involve care discussion time.  Rush Farmer, M.D. [facility] Pulmonary/Critical Care Medicine. Pager: [phone]. After hours pager: [phone].  03/30/2015 10:00 AM

## 2015-03-31 ENCOUNTER — Inpatient Hospital Stay (HOSPITAL_COMMUNITY): Payer: Medicare Other

## 2015-03-31 DIAGNOSIS — I639 Cerebral infarction, unspecified: Secondary | ICD-10-CM | POA: Insufficient documentation

## 2015-03-31 LAB — GLUCOSE, CAPILLARY
GLUCOSE-CAPILLARY: 164 mg/dL — AB (ref 65–99)
GLUCOSE-CAPILLARY: 146 mg/dL — AB (ref 65–99)
GLUCOSE-CAPILLARY: 162 mg/dL — AB (ref 65–99)
Glucose-Capillary: 138 mg/dL — ABNORMAL HIGH (ref 65–99)
Glucose-Capillary: 157 mg/dL — ABNORMAL HIGH (ref 65–99)
Glucose-Capillary: 165 mg/dL — ABNORMAL HIGH (ref 65–99)

## 2015-03-31 LAB — CBC WITH DIFFERENTIAL/PLATELET
Basophils Absolute: 0 10*3/uL (ref 0.0–0.1)
Basophils Relative: 0 % (ref 0–1)
EOS PCT: 0 % (ref 0–5)
Eosinophils Absolute: 0 10*3/uL (ref 0.0–0.7)
HEMATOCRIT: 27.3 % — AB (ref 39.0–52.0)
Hemoglobin: 8.1 g/dL — ABNORMAL LOW (ref 13.0–17.0)
LYMPHS PCT: 8 % — AB (ref 12–46)
Lymphs Abs: 1.2 10*3/uL (ref 0.7–4.0)
MCH: 30.2 pg (ref 26.0–34.0)
MCHC: 29.7 g/dL — ABNORMAL LOW (ref 30.0–36.0)
MCV: 101.9 fL — ABNORMAL HIGH (ref 78.0–100.0)
Monocytes Absolute: 0.7 10*3/uL (ref 0.1–1.0)
Monocytes Relative: 5 % (ref 3–12)
Neutro Abs: 12.8 10*3/uL — ABNORMAL HIGH (ref 1.7–7.7)
Neutrophils Relative %: 87 % — ABNORMAL HIGH (ref 43–77)
Platelets: 235 10*3/uL (ref 150–400)
RBC: 2.68 MIL/uL — ABNORMAL LOW (ref 4.22–5.81)
RDW: 17 % — ABNORMAL HIGH (ref 11.5–15.5)
WBC: 14.7 10*3/uL — ABNORMAL HIGH (ref 4.0–10.5)

## 2015-03-31 LAB — SODIUM
SODIUM: 165 mmol/L — AB (ref 135–145)
Sodium: 165 mmol/L (ref 135–145)

## 2015-03-31 LAB — BLOOD GAS, ARTERIAL
ACID-BASE EXCESS: 4 mmol/L — AB (ref 0.0–2.0)
Bicarbonate: 27.8 mEq/L — ABNORMAL HIGH (ref 20.0–24.0)
Drawn by: 426241
FIO2: 0.4 %
LHR: 16 {breaths}/min
MECHVT: 570 mL
O2 Saturation: 99 %
PATIENT TEMPERATURE: 98.6
PCO2 ART: 40.1 mmHg (ref 35.0–45.0)
PEEP/CPAP: 5 cmH2O
PH ART: 7.455 — AB (ref 7.350–7.450)
TCO2: 29 mmol/L (ref 0–100)
pO2, Arterial: 174 mmHg — ABNORMAL HIGH (ref 80.0–100.0)

## 2015-03-31 LAB — BASIC METABOLIC PANEL
Anion gap: 6 (ref 5–15)
BUN: 65 mg/dL — ABNORMAL HIGH (ref 6–20)
CO2: 30 mmol/L (ref 22–32)
Calcium: 8.6 mg/dL — ABNORMAL LOW (ref 8.9–10.3)
Chloride: 129 mmol/L — ABNORMAL HIGH (ref 101–111)
Creatinine, Ser: 2.49 mg/dL — ABNORMAL HIGH (ref 0.61–1.24)
GFR, EST AFRICAN AMERICAN: 28 mL/min — AB (ref >60)
GFR, EST NON AFRICAN AMERICAN: 24 mL/min — AB (ref >60)
Glucose, Bld: 173 mg/dL — ABNORMAL HIGH (ref 65–99)
POTASSIUM: 3.9 mmol/L (ref 3.5–5.1)
Sodium: 165 mmol/L (ref 135–145)

## 2015-03-31 LAB — MAGNESIUM: Magnesium: 2.2 mg/dL (ref 1.7–2.4)

## 2015-03-31 LAB — PHOSPHORUS: Phosphorus: 3.9 mg/dL (ref 2.5–4.6)

## 2015-03-31 NOTE — Progress Notes (Signed)
STROKE TEAM PROGRESS NOTE   SUBJECTIVE (INTERVAL HISTORY) His wife and daughter are at the bedside.  He is still intubated on sedation. He open eyes on voice but not following commands. Hemoglobin up now to 8.1, after she  received 1 unit PRBC. Sodium 165, and 3% drip on hold. No frank GI bleeding.   .    OBJECTIVE Temp:  [98.8 F (37.1 C)-100.7 F (38.2 C)] 100.7 F (38.2 C) (05/29 0800) Pulse Rate:  [95-127] 115 (05/29 1400) Cardiac Rhythm:  [-] Sinus tachycardia (05/29 0800) Resp:  [15-42] 15 (05/29 1400) BP: (143-183)/(64-76) 149/66 mmHg (05/29 1400) SpO2:  [99 %-100 %] 100 % (05/29 1400) FiO2 (%):  [40 %] 40 % (05/29 1146) Weight:  [137 lb 12.6 oz (62.5 kg)] 137 lb 12.6 oz (62.5 kg) (05/29 0400)   Recent Labs Lab 03/30/15 1545 03/30/15 2000 03/30/15 2322 03/31/15 0318 03/31/15 1144  GLUCAP 137* 120* 139* 138* 157*    Recent Labs Lab 03/27/15 0546 03/28/15 0234  03/29/15 0150  03/30/15 0050  03/30/15 1400 03/30/15 2035 03/31/15 0210 03/31/15 0535 03/31/15 0800  NA 140 146*  < > 153*  < > 161*  163*  < > 167* 165* 165* 165* 165*  K 4.2 3.1*  --  3.9  --  3.7  --   --   --   --  3.9  --   CL 120* 114*  --  115*  --  125*  --   --   --   --  129*  --   CO2 10* 22  --  27  --  26  --   --   --   --  30  --   GLUCOSE 114* 143*  --  134*  --  120*  --   --   --   --  173*  --   BUN 52* 55*  --  55*  --  59*  --   --   --   --  65*  --   CREATININE 2.41* 2.40*  --  2.26*  --  2.27*  --   --   --   --  2.49*  --   CALCIUM 8.2* 8.1*  --  7.9*  --  8.6*  --   --   --   --  8.6*  --   MG  --   --   --   --   --  2.1  --   --   --   --  2.2  --   PHOS  --   --   --   --   --  2.7  --   --   --   --  3.9  --   < > = values in this interval not displayed. No results for input(s): AST, ALT, ALKPHOS, BILITOT, PROT, ALBUMIN in the last 168 hours.  Recent Labs Lab 03/27/15 0546  03/29/15 0150 03/29/15 0250 03/29/15 1500 03/30/15 0050 03/31/15 0535  WBC 16.5*  < >  12.6* 12.6* 13.4* 15.8* 14.7*  NEUTROABS 15.0*  --   --   --   --  13.7* 12.8*  HGB 8.5*  < > 6.0* 6.1* 7.6* 8.1* 8.1*  HCT 25.6*  < > 18.5* 18.8* 23.4* 25.1* 27.3*  MCV 93.4  < > 94.9 94.9 95.5 97.3 101.9*  PLT 201  < > 169 174 167 196 235  < > = values in this interval not displayed. No  results for input(s): CKTOTAL, CKMB, CKMBINDEX, TROPONINI in the last 168 hours. No results for input(s): LABPROT, INR in the last 72 hours.  Recent Labs  03/28/15 1840  COLORURINE YELLOW  LABSPEC 1.020  PHURINE 5.0  GLUCOSEU NEGATIVE  HGBUR NEGATIVE  BILIRUBINUR NEGATIVE  KETONESUR NEGATIVE  PROTEINUR 30*  UROBILINOGEN 0.2  NITRITE NEGATIVE  LEUKOCYTESUR NEGATIVE       Component Value Date/Time   CHOL 113 03/19/2015 0340   TRIG 166* 03/29/2015 1608   TRIG 135 10/13/2006 0754   HDL 24* 03/19/2015 0340   CHOLHDL 4.7 03/19/2015 0340   CHOLHDL 4.6 CALC 10/13/2006 0754   VLDL 49* 03/19/2015 0340   LDLCALC 40 03/19/2015 0340   Lab Results  Component Value Date   HGBA1C 5.5 03/27/2015      Component Value Date/Time   LABOPIA NONE DETECTED 03/19/2015 0329   COCAINSCRNUR NONE DETECTED 03/19/2015 0329   LABBENZ POSITIVE* 03/19/2015 0329   AMPHETMU NONE DETECTED 03/19/2015 0329   THCU NONE DETECTED 03/19/2015 0329   LABBARB NONE DETECTED 03/19/2015 0329    No results for input(s): ETH in the last 168 hours.  I have personally reviewed the radiological images below and agree with the radiology interpretations.  Ct Head Wo Contrast 03/28/15 Large territory right MCA infarct shows mild progression in edema since yesterday. Mild midline shift shows slight progression now measuring 2.5 mm. Improvement high attenuation blood/ contrast in the right sylvian fissure. Small amount of blood in the lateral ventricle.  03/29/2015   IMPRESSION: Large territory right MCA infarct. High-density within the right sylvian fissure most likely is contrast extravasation possibly with some hemorrhage, post  right MCA clot retrieval and tPA.   Ct Head Wo Contrast  03/18/2015   IMPRESSION: Atrophy with small vessel chronic ischemic changes of deep cerebral white matter.  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  No acute intracranial abnormalities.   Mri and Mra Brain Without Contrast  03/19/2015    IMPRESSION: MRI HEAD: No acute intracranial process, specifically no acute ischemia.  Involutional changes. Remote LEFT corona radiata lacunar infarct. Moderate to severe white matter changes suggest chronic small vessel ischemic disease.  Scattered tiny foci of susceptibility artifact can be seen with chronic hypertension though are nonspecific.  MRA HEAD: No acute large vessel occlusion or high-grade stenosis. Complete circle of Willis.  Mild luminal regularity of the proximal cerebral arteries most consistent with atherosclerosis.    Portable Chest Xray  03/27/2015    IMPRESSION: No active disease.  Endotracheal tube with tip 5.9 cm above the carina.    2D Echocardiogram  - Left ventricle: The cavity size was normal. Wall thickness was increased in a pattern of mild LVH. The estimated ejection fraction was 55%. Wall motion was normal; there were no regional wall motion abnormalities. - Left atrium: The atrium was mildly dilated. - Impressions: No cardiac source of embolism was identified, but cannot be ruled out on the basis of this examination. Impressions: - No cardiac source of embolism was identified, but cannot be ruled out on the basis of this examination.  PHYSICAL EXAM  Temp:  [98.8 F (37.1 C)-100.7 F (38.2 C)] 100.7 F (38.2 C) (05/29 0800) Pulse Rate:  [95-127] 115 (05/29 1400) Resp:  [15-42] 15 (05/29 1400) BP: (143-183)/(64-76) 149/66 mmHg (05/29 1400) SpO2:  [99 %-100 %] 100 % (05/29 1400) FiO2 (%):  [40 %] 40 % (05/29 1146) Weight:  [137 lb 12.6 oz (62.5 kg)] 137 lb 12.6 oz (62.5 kg) (05/29  0400)  General - Well nourished, well developed,  still intubated and on sedation.  Ophthalmologic - fundi not visualized due to eye movement.  Cardiovascular - Regular rate and rhythm.  Neuro - intubated and on sedation, eyes open on voice but not following any commands, doll's eyes present, pupil equal reactive to light, left corneal reflex absent, right corneal reflex present, weak cough reflex present. Breathing over the vent, on pain stimulation, right upper extremity able to against gravity, left upper extremity 0/5, bilateral lower extremity withdrawal to pain 2+/5. Reflex 1+, left Babinski positive. Sensation, coordination and gait not tested. Coarse tremors right hand when held up  ASSESSMENT/PLAN Mr. LATERRANCE NAUTA is a 74 y.o. male with history of hypertension, hyperlipidemia, diabetes mellitus, COPD, coronary artery disease, peripheral vascular disease and TIA due to left ICA high grade stenosis admitted for right MCA stroke during left CEA procedure. Patient was intubated, as status post IV TPA and right MCA mechanical thrombectomy with re-cannulization. However, MRI showed right complete MCA infarct.    Stroke:  Non-dominant right large MCA malignant infarct s/p IV tPA and mechanical thrombectomy with recannulization, embolic secondary to procedure related.   MRI right large MCA malignant infarct  MRA  Right MCA patent with right hemisphere luxury perfusion  Repeat CT showed right hemisphere cerebral edema without significant midline shift  Carotid Doppler 03/19/15 - left ICA > 80% stenosis  2D Echo  unremarkable  LDL 40  HgbA1c 5.3  SCDs for VTE prophylaxis  aspirin 81 mg orally every day and clopidogrel 75 mg orally every day prior to admission, now on no antithrombotic due to [facility] and IVH post procedure.   Discussed with wife, daughter, and son regarding patient diagnosis, treatment plan and likely prognosis. Likely poor prognosis at this time.   SAH and IVH post procedure  CT showed right sylvian fissure SAH and  small amount of IVH  Repeat CT showed improvement of SAH   Likely related to thrombectomy  Hold off antiplatelets   Cerebral edema  Mild midline shift  Repeat CT showed minimal midline shift  Started on 3% saline --> now D/c as sodium as 164  Sodium goal 150-160  Not a candidate for hemicraniectomy based on age and medical condition  Family meeting today, please look below.   Carotid stenosis  Left ICA stenosis > 80% at bulb  Avoid hypotension  BP goal 130-160   Hypertension  Home meds: Clonidine, labetalol  Currently no BP meds  BP at high side, put on cleviprex  Avoid hypotension  BP goal 130-160   Anemia  Hb 6.1  Received PRBC transfusion  CCM on board  Close monitoring  Hyperlipidemia  Home meds: lipitor 10, resumed in hospital  LDL 40, goal < 70  Continue statin at discharge  DM  A1C 5.5  Under control  Other Stroke Risk Factors  Advanced age  Cigarette smoker, quit smoking 10 months ago , re-advised to stop smoking  Coronary artery disease - MI, PCI s/ stent 1998  PVD  Other Active Problems  Anxiety  COPD w/ emphysema  GERD  Barrett's esophagus  Depression  AAA repair 2010   CKD stage 3  hypokalemia  Other Pertinent History  Significant time spent in family meeting today with daughter and wife.  Pt has sodium of 165, hypertonic saline on hold. Repeat CTH with evolution of stroke and minimal <20mm midline shift. Suspect pt is at peak of edema phase. Pt is not following commands  Family made  it clear that pt has a living will.  Will not proceed to trach/peg route.  Wife and daughter evidently to palliative care and comfort care only but want other family members to come and see the goodbyes  Possibly by later today prior to making him full comfort care Hospital day # 5  This patient is critically ill due to large right MCA stroke, subarachnoid hemorrhage, left ICA stenosis and at significant risk of  neurological worsening, death form hemorrhagic transformation, cerebral edema, brain herniation, seizure, recurrent stroke. This patient's care requires constant monitoring of vital signs, hemodynamics, respiratory and cardiac monitoring, review of multiple databases, neurological assessment, discussion with family, other specialists and medical decision making of high complexity. I spent 45 minutes of neurocritical care time in the care of this patient.   Aerith Canal  Stroke Neurology 03/31/2015 2:02 PM    To contact Stroke Continuity provider, please refer to [email or web address] After hours, contact General Neurology

## 2015-03-31 NOTE — Progress Notes (Signed)
OT Cancellation Note  Patient Details Name: [name] MRN: [id number] DOB: [birth year]   Cancelled Treatment:    Reason Eval/Treat Not Completed: Patient not medically ready. Spoke with nurse who says family is looking like they are leaning towards extubating and seeing how pt does. Pt currently vented and on IV BP meds. Will keep on caseload until family decides for sure and evaluate or D/C as appropriate.  Almon Register [phone] 03/31/2015, 12:19 PM

## 2015-03-31 NOTE — Progress Notes (Signed)
CRITICAL VALUE ALERT  Critical value received:  Na 165    Date of notification:  03/31/2015  Time of notification:  0355  Critical value read back:Yes.    Nurse who received alert:  Tereasa Coop, RN   MD notified (1st page):  Dr. Doy Mince   Time of first page:  0300  MD notified (2nd page):  Dr. Doy Mince   Time of second page:  0330  Responding MD:  Dr. Doy Mince  Time MD responded:  [phone]

## 2015-03-31 NOTE — Progress Notes (Signed)
PULMONARY / CRITICAL CARE MEDICINE   Name: [name] MRN: [id number] DOB: 04/04/1941    ADMISSION DATE:  03/24/2015 CONSULTATION DATE:  5/24  REFERRING MD :  Janann Colonel   CHIEF COMPLAINT:  Vent management   INITIAL PRESENTATION:  [name] is a 74 yo male with multiple medical problems. He presents 5/24 for left ICA stent procedure. During the procedure patient suffered a CT confirmed ischemic stroke with left sided flaccidity and right eye deviation. tPA was administered at 1127 on 04/01/2015 and then taken for additional intervention. Returned to ICU on full vent support. PCCM consulted for vent management.     INDWELLING DEVICES:: ETT 5/24 >>  L [place] CVL 5/26  MICRO DATA: None  ANTIMICROBIALS:  None  SIGNIFICANT EVENTS/STUDIES:  5/17: MRI/MRA brain: No acute intracranial process, specifically no acute  ischemia.Involutional changes. Remote LEFT corona radiata lacunar infarct. Moderate to severe white matter changes suggest chronic small vessel ischemic disease.Scattered tiny foci of susceptibility artifact can be seen with chronic hypertension though are nonspecific. Mild luminal regularity of the proximal cerebral arteries most consistent with atherosclerosis. 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 Procedure complicated by acute R carotid occlusion 5/24 Neuro IR procedure: tPA and mechanical clot removal from R MCA with complete recanalization. Left intubated post procedure 5/24 CT head: Large territory right MCA infarct. High-density within the right sylvian fissure most likely is contrast extravasation possibly with some hemorrhage, post right MCA clot retrieval and tPA  5/25 MRI brain: Large territory acute right MCA infarct with mild hemorrhage in the right frontal infarct. Acute infarct also extends into the right PCA territory related to fetal origin of the right posterior cerebral artery.  Smaller acute infarcts also noted in the cerebellum bilaterally, left occipital lobe, and left anterior pons  5/26 CT head: Large territory right MCA infarct shows mild progression in edema since yesterday. Mild midline shift shows slight progression now measuring 2.5 mm 5/26 hypertonic saline protocol initiated by Stroke team  SUBJECTIVE/OVERNIGHT/INTERVAL HX No events overnight, unresponsive.  VITAL SIGNS: Temp:  [98.2 F (36.8 C)-100.7 F (38.2 C)] 100.7 F (38.2 C) (05/29 0800) Pulse Rate:  [95-127] 109 (05/29 0900) Resp:  [15-42] 16 (05/29 0900) BP: (143-183)/(66-81) 143/66 mmHg (05/29 0900) SpO2:  [99 %-100 %] 100 % (05/29 0900) FiO2 (%):  [40 %] 40 % (05/29 0822) Weight:  [62.5 kg (137 lb 12.6 oz)] 62.5 kg (137 lb 12.6 oz) (05/29 0400) HEMODYNAMICS:   VENTILATOR SETTINGS: Vent Mode:  [-] PRVC FiO2 (%):  [40 %] 40 % Set Rate:  [16 bmp] 16 bmp Vt Set:  [570 mL] 570 mL PEEP:  [5 cmH20] 5 cmH20 Plateau Pressure:  [15 cmH20] 15 cmH20   INTAKE / OUTPUT:  Intake/Output Summary (Last 24 hours) at 03/31/15 0931 Last data filed at 03/31/15 0900  Gross per 24 hour  Intake   1488 ml  Output   1235 ml  Net    253 ml   PHYSICAL EXAMINATION: General: RASS -3, not F/C Neuro: L hemiplegia, unresponsive HEENT: NCAT, PERRL, EOM-I and MMM Cardiovascular: Reg, no M Lungs:  Clear, poor gag, intact respiratory drive Abdomen:  Soft, + bowel sounds  EXT: Warm, no edema Skin:  Intact   LABS:  PULMONARY  Recent Labs Lab 03/06/2015 0609 03/23/2015 1703 03/25/2015 2145 03/27/15 0548 03/31/15 0418  PHART  --  7.184* 7.263* 7.253* 7.455*  PCO2ART  --  32.9* 27.5*  22.7* 40.1  PO2ART  --  543* 245* 187* 174*  HCO3  --  11.9* 12.0* 9.7* 27.8*  TCO2 15 12.9 12.8 10.4 29.0  O2SAT  --  99.7 99.3 99.0 99.0   CBC  Recent Labs Lab 03/29/15 1500 03/30/15 0050 03/31/15 0535  HGB 7.6* 8.1* 8.1*  HCT 23.4* 25.1* 27.3*  WBC 13.4* 15.8* 14.7*  PLT 167 196 235   COAGULATION No results  for input(s): INR in the last 168 hours.  CARDIAC  No results for input(s): TROPONINI in the last 168 hours. No results for input(s): PROBNP in the last 168 hours.  CHEMISTRY  Recent Labs Lab 03/27/15 0546 03/28/15 0234  03/29/15 0150  03/30/15 0050 03/30/15 0828 03/30/15 1400 03/30/15 2035 03/31/15 0210 03/31/15 0535  NA 140 146*  < > 153*  < > 161*  163* 164* 167* 165* 165* 165*  K 4.2 3.1*  --  3.9  --  3.7  --   --   --   --  3.9  CL 120* 114*  --  115*  --  125*  --   --   --   --  129*  CO2 10* 22  --  27  --  26  --   --   --   --  30  GLUCOSE 114* 143*  --  134*  --  120*  --   --   --   --  173*  BUN 52* 55*  --  55*  --  59*  --   --   --   --  65*  CREATININE 2.41* 2.40*  --  2.26*  --  2.27*  --   --   --   --  2.49*  CALCIUM 8.2* 8.1*  --  7.9*  --  8.6*  --   --   --   --  8.6*  MG  --   --   --   --   --  2.1  --   --   --   --  2.2  PHOS  --   --   --   --   --  2.7  --   --   --   --  3.9  < > = values in this interval not displayed. Estimated Creatinine Clearance: 23.4 mL/min (by C-G formula based on Cr of 2.49).  LIVER No results for input(s): AST, ALT, ALKPHOS, BILITOT, PROT, ALBUMIN, INR in the last 168 hours.  INFECTIOUS No results for input(s): LATICACIDVEN, PROCALCITON in the last 168 hours.  ENDOCRINE CBG (last 3)   Recent Labs  03/30/15 2000 03/30/15 2322 03/31/15 0318  GLUCAP 120* 139* 138*   IMAGING x48h  - iCt Head Wo Contrast  03/30/2015   CLINICAL DATA:  Continued surveillance cerebral infarctions. Patient not following commands.  EXAM: CT HEAD WITHOUT CONTRAST  TECHNIQUE: Contiguous axial images were obtained from the base of the skull through the vertex without intravenous contrast.  COMPARISON:  Multiple priors, most recent CT 03/28/2015.  FINDINGS: Increasingly well-defined cytotoxic edema throughout the RIGHT hemisphere affecting the RIGHT MCA and PCA territories consistent with recent RIGHT hemisphere vascular occlusion.  Parenchymal, subarachnoid, and intraventricular hemorrhage persists without significant worsening. 4 mm RIGHT-to-LEFT shift is minimally increased from prior scan. RIGHT greater than LEFT cerebellar infarcts are redemonstrated, without significant posterior fossa edema.  No acute sinus or mastoid disease.  IMPRESSION: Increasing well-defined cytotoxic edema throughout the RIGHT hemisphere. Slight increase RIGHT-to-LEFT shift  4 mm.  Unchanged cerebellar infarcts.  Parenchymal, subarachnoid, and intraventricular hemorrhage without significant worsening.   Electronically Signed   By: Rolla Flatten M.D.   On: 03/30/2015 07:37   Dg Chest Port 1 View  03/31/2015   CLINICAL DATA:  ETTHx of Cancer, DM, HTN, COPD, CAD, barrett's esophagus with high grade dysplasia, hiatal hernia  EXAM: PORTABLE CHEST - 1 VIEW  COMPARISON:  03/30/2015  FINDINGS: Endotracheal tube, nasogastric tube and left subclavian central venous line are stable and well positioned.  Lungs are hyperexpanded. No convincing pneumonia or pulmonary edema. No pleural effusion or pneumothorax  IMPRESSION: 1. No change from the previous day's study. 2. COPD.  No convincing acute cardiopulmonary disease.   Electronically Signed   By: Lajean Manes M.D.   On: 03/31/2015 09:11   Dg Chest Port 1 View  03/30/2015   CLINICAL DATA:  Intubated patient.  Follow-up exam.  EXAM: PORTABLE CHEST - 1 VIEW  COMPARISON:  03/29/2015  FINDINGS: Lungs remain hyperexpanded. No evidence of pneumonia or pulmonary edema. No pleural effusion or pneumothorax.  Endotracheal tube tip projects 4.9 cm above the carina.  Orogastric tube passes below the diaphragm well into stomach. Left subclavian central venous line tip projects in the mid to lower superior vena cava.  IMPRESSION: 1. No significant change from the previous day's study. 2. No convincing pneumonia or pulmonary edema. Hyperexpanded lungs suggests COPD. 3. Support apparatus is stable and well positioned.   Electronically  Signed   By: Lajean Manes M.D.   On: 03/30/2015 09:27   ASSESSMENT / PLAN:  PULMONARY A: VDRF - intubated for neuro IR procedure  Remains intubated due to AMS COPD without wheezing  P:   Hold PSV today due to low RR off sedation. Cont vent bundle. Cont nebulized BDs. CXR and ABG in AM.  CARDIOVASCULAR A: HTN Bradycardia, resolved P: SBP goals per Stroke team Cont PRN hydralazine   RENAL A:  Hx of renal tubular acidosis with CKD, baseline Cr 2.07 AKI - now improving P: Monitor BMET. Monitor I/Os. Correct electrolytes as indicated. D/C sodium bicarb since bicarb is improving.  GASTROINTESTINAL A:  NO issues P:   SUP: enteral PPI. Cont TFs.  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 Monitor CBC intermittently. Transfuse per usual ICU guidelines.  INFECTIOUS A:   No acute problem P: Micro and abx as above.  ENDOCRINE A:  DM 2 P:   Cont SSI per protocol.   NEUROLOGIC A:  Large R MCA teritory CVA Status post re-canalization 5/24   - still with poor mental status. RASS -4 on diprivan P:   RASS goal:-1 Mgmt per Stroke team Hypertonic saline 5/26 > 5/28  Updates: Family updated by neurology, DNR status for now and family will make a decision next week after the holiday weekend is done..  The patient is critically ill with multiple organ systems failure and requires high complexity decision making for assessment and support, frequent evaluation and titration of therapies, application of advanced monitoring technologies and extensive interpretation of multiple databases.   Critical Care Time devoted to patient care services described in this note is  35  Minutes. This time reflects time of care of this signee Dr Jennet Maduro. This critical care time does not reflect procedure time, or teaching time or supervisory time of PA/NP/Med student/Med  Resident etc  but could involve care discussion time.  Rush Farmer, M.D. [facility] Pulmonary/Critical Care Medicine. Pager: [phone]. After hours pager: [phone].  03/31/2015 9:31 AM

## 2015-04-01 ENCOUNTER — Inpatient Hospital Stay (HOSPITAL_COMMUNITY): Payer: Medicare Other

## 2015-04-01 LAB — BLOOD GAS, ARTERIAL
ACID-BASE EXCESS: 4.7 mmol/L — AB (ref 0.0–2.0)
Bicarbonate: 28.3 mEq/L — ABNORMAL HIGH (ref 20.0–24.0)
DRAWN BY: 23604
FIO2: 0.4 %
MECHVT: 570 mL
O2 Saturation: 99.3 %
PCO2 ART: 38.9 mmHg (ref 35.0–45.0)
PEEP/CPAP: 5 cmH2O
Patient temperature: 98.6
RATE: 16 resp/min
TCO2: 29.5 mmol/L (ref 0–100)
pH, Arterial: 7.474 — ABNORMAL HIGH (ref 7.350–7.450)
pO2, Arterial: 166 mmHg — ABNORMAL HIGH (ref 80.0–100.0)

## 2015-04-01 LAB — CBC WITH DIFFERENTIAL/PLATELET
Basophils Absolute: 0 10*3/uL (ref 0.0–0.1)
Basophils Relative: 0 % (ref 0–1)
EOS PCT: 1 % (ref 0–5)
Eosinophils Absolute: 0.1 10*3/uL (ref 0.0–0.7)
HCT: 26.1 % — ABNORMAL LOW (ref 39.0–52.0)
HEMOGLOBIN: 7.8 g/dL — AB (ref 13.0–17.0)
LYMPHS PCT: 5 % — AB (ref 12–46)
Lymphs Abs: 0.7 10*3/uL (ref 0.7–4.0)
MCH: 31.1 pg (ref 26.0–34.0)
MCHC: 29.9 g/dL — ABNORMAL LOW (ref 30.0–36.0)
MCV: 104 fL — AB (ref 78.0–100.0)
Monocytes Absolute: 1.2 10*3/uL — ABNORMAL HIGH (ref 0.1–1.0)
Monocytes Relative: 8 % (ref 3–12)
NEUTROS ABS: 11.7 10*3/uL — AB (ref 1.7–7.7)
NEUTROS PCT: 86 % — AB (ref 43–77)
Platelets: 227 10*3/uL (ref 150–400)
RBC: 2.51 MIL/uL — AB (ref 4.22–5.81)
RDW: 16.5 % — ABNORMAL HIGH (ref 11.5–15.5)
WBC: 13.7 10*3/uL — AB (ref 4.0–10.5)

## 2015-04-01 LAB — BASIC METABOLIC PANEL
Anion gap: 10 (ref 5–15)
BUN: 73 mg/dL — ABNORMAL HIGH (ref 6–20)
CO2: 29 mmol/L (ref 22–32)
Calcium: 8.3 mg/dL — ABNORMAL LOW (ref 8.9–10.3)
Chloride: 127 mmol/L — ABNORMAL HIGH (ref 101–111)
Creatinine, Ser: 2.62 mg/dL — ABNORMAL HIGH (ref 0.61–1.24)
GFR calc Af Amer: 26 mL/min — ABNORMAL LOW (ref >60)
GFR calc non Af Amer: 23 mL/min — ABNORMAL LOW (ref >60)
Glucose, Bld: 171 mg/dL — ABNORMAL HIGH (ref 65–99)
Potassium: 4 mmol/L (ref 3.5–5.1)
Sodium: 166 mmol/L (ref 135–145)

## 2015-04-01 LAB — GLUCOSE, CAPILLARY
Glucose-Capillary: 180 mg/dL — ABNORMAL HIGH (ref 65–99)
Glucose-Capillary: 156 mg/dL — ABNORMAL HIGH (ref 65–99)

## 2015-04-01 LAB — MAGNESIUM: Magnesium: 2.1 mg/dL (ref 1.7–2.4)

## 2015-04-01 LAB — PHOSPHORUS: PHOSPHORUS: 3.7 mg/dL (ref 2.5–4.6)

## 2015-04-01 MED ORDER — MORPHINE SULFATE 25 MG/ML IV SOLN
10.0000 mg/h | INTRAVENOUS | Status: DC
  Administered 2015-04-01 – 2015-04-02 (×2): 10 mg/h via INTRAVENOUS
  Filled 2015-04-01 (×2): qty 10

## 2015-04-01 MED ORDER — WHITE PETROLATUM GEL
Status: AC
  Filled 2015-04-01: qty 1

## 2015-04-01 MED ORDER — MORPHINE BOLUS VIA INFUSION
5.0000 mg | INTRAVENOUS | Status: DC | PRN
  Administered 2015-04-01: 10 mg via INTRAVENOUS
  Filled 2015-04-01 (×2): qty 20

## 2015-04-01 NOTE — Progress Notes (Signed)
STROKE TEAM PROGRESS NOTE   SUBJECTIVE (INTERVAL HISTORY) His wife and daughter are at the bedside.  He is still intubated on sedation. He open eyes on voice but not following commands. Hemoglobin  now to 7.81,   Sodium 166, and 3% drip on hold. No frank GI bleeding. Family now agreeable to comfort care and withdrawal of ventilatory support   .    OBJECTIVE Temp:  [98.3 F (36.8 C)-101.7 F (38.7 C)] 101.7 F (38.7 C) (05/30 0748) Pulse Rate:  [109-129] 112 (05/30 1300) Cardiac Rhythm:  [-] Sinus tachycardia (05/30 0800) Resp:  [15-40] 22 (05/30 1300) BP: (129-195)/(59-87) 178/87 mmHg (05/30 1300) SpO2:  [88 %-100 %] 88 % (05/30 1300) FiO2 (%):  [40 %] 40 % (05/30 1100) Weight:  [141 lb 8.6 oz (64.2 kg)] 141 lb 8.6 oz (64.2 kg) (05/30 0400)   Recent Labs Lab 03/31/15 1544 03/31/15 1934 03/31/15 2322 04/01/15 0318 04/01/15 0748  GLUCAP 146* 165* 162* 180* 156*    Recent Labs Lab 03/28/15 0234  03/29/15 0150  03/30/15 0050  03/30/15 2035 03/31/15 0210 03/31/15 0535 03/31/15 0800 04/01/15 0530  NA 146*  < > 153*  < > 161*  163*  < > 165* 165* 165* 165* 166*  K 3.1*  --  3.9  --  3.7  --   --   --  3.9  --  4.0  CL 114*  --  115*  --  125*  --   --   --  129*  --  127*  CO2 22  --  27  --  26  --   --   --  30  --  29  GLUCOSE 143*  --  134*  --  120*  --   --   --  173*  --  171*  BUN 55*  --  55*  --  59*  --   --   --  65*  --  73*  CREATININE 2.40*  --  2.26*  --  2.27*  --   --   --  2.49*  --  2.62*  CALCIUM 8.1*  --  7.9*  --  8.6*  --   --   --  8.6*  --  8.3*  MG  --   --   --   --  2.1  --   --   --  2.2  --  2.1  PHOS  --   --   --   --  2.7  --   --   --  3.9  --  3.7  < > = values in this interval not displayed. No results for input(s): AST, ALT, ALKPHOS, BILITOT, PROT, ALBUMIN in the last 168 hours.  Recent Labs Lab 03/27/15 0546  03/29/15 0250 03/29/15 1500 03/30/15 0050 03/31/15 0535 04/01/15 0530  WBC 16.5*  < > 12.6* 13.4* 15.8* 14.7* 13.7*   NEUTROABS 15.0*  --   --   --  13.7* 12.8* 11.7*  HGB 8.5*  < > 6.1* 7.6* 8.1* 8.1* 7.8*  HCT 25.6*  < > 18.8* 23.4* 25.1* 27.3* 26.1*  MCV 93.4  < > 94.9 95.5 97.3 101.9* 104.0*  PLT 201  < > 174 167 196 235 227  < > = values in this interval not displayed. No results for input(s): CKTOTAL, CKMB, CKMBINDEX, TROPONINI in the last 168 hours. No results for input(s): LABPROT, INR in the last 72 hours. No results for input(s): COLORURINE, [place], [place],  GLUCOSEU, HGBUR, BILIRUBINUR, KETONESUR, PROTEINUR, UROBILINOGEN, NITRITE, LEUKOCYTESUR in the last 72 hours.  Invalid input(s): APPERANCEUR     Component Value Date/Time   CHOL 113 03/19/2015 0340   TRIG 166* 03/29/2015 1608   TRIG 135 10/13/2006 0754   HDL 24* 03/19/2015 0340   CHOLHDL 4.7 03/19/2015 0340   CHOLHDL 4.6 CALC 10/13/2006 0754   VLDL 49* 03/19/2015 0340   LDLCALC 40 03/19/2015 0340   Lab Results  Component Value Date   HGBA1C 5.5 03/27/2015      Component Value Date/Time   LABOPIA NONE DETECTED 03/19/2015 0329   COCAINSCRNUR NONE DETECTED 03/19/2015 0329   LABBENZ POSITIVE* 03/19/2015 0329   AMPHETMU NONE DETECTED 03/19/2015 0329   THCU NONE DETECTED 03/19/2015 0329   LABBARB NONE DETECTED 03/19/2015 0329    No results for input(s): ETH in the last 168 hours.  I have personally reviewed the radiological images below and agree with the radiology interpretations.  Ct Head Wo Contrast 03/28/15 Large territory right MCA infarct shows mild progression in edema since yesterday. Mild midline shift shows slight progression now measuring 2.5 mm. Improvement high attenuation blood/ contrast in the right sylvian fissure. Small amount of blood in the lateral ventricle.  03/23/2015   IMPRESSION: Large territory right MCA infarct. High-density within the right sylvian fissure most likely is contrast extravasation possibly with some hemorrhage, post right MCA clot retrieval and tPA.   Ct Head Wo Contrast  03/18/2015    IMPRESSION: Atrophy with small vessel chronic ischemic changes of deep cerebral white matter.  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  No acute intracranial abnormalities.   Mri and Mra Brain Without Contrast  03/19/2015    IMPRESSION: MRI HEAD: No acute intracranial process, specifically no acute ischemia.  Involutional changes. Remote LEFT corona radiata lacunar infarct. Moderate to severe white matter changes suggest chronic small vessel ischemic disease.  Scattered tiny foci of susceptibility artifact can be seen with chronic hypertension though are nonspecific.  MRA HEAD: No acute large vessel occlusion or high-grade stenosis. Complete circle of Willis.  Mild luminal regularity of the proximal cerebral arteries most consistent with atherosclerosis.    Portable Chest Xray  03/27/2015    IMPRESSION: No active disease.  Endotracheal tube with tip 5.9 cm above the carina.    2D Echocardiogram  - Left ventricle: The cavity size was normal. Wall thickness was increased in a pattern of mild LVH. The estimated ejection fraction was 55%. Wall motion was normal; there were no regional wall motion abnormalities. - Left atrium: The atrium was mildly dilated. - Impressions: No cardiac source of embolism was identified, but cannot be ruled out on the basis of this examination. Impressions: - No cardiac source of embolism was identified, but cannot be ruled out on the basis of this examination.  PHYSICAL EXAM  Temp:  [98.3 F (36.8 C)-101.7 F (38.7 C)] 101.7 F (38.7 C) (05/30 0748) Pulse Rate:  [109-129] 112 (05/30 1300) Resp:  [15-40] 22 (05/30 1300) BP: (129-195)/(59-87) 178/87 mmHg (05/30 1300) SpO2:  [88 %-100 %] 88 % (05/30 1300) FiO2 (%):  [40 %] 40 % (05/30 1100) Weight:  [141 lb 8.6 oz (64.2 kg)] 141 lb 8.6 oz (64.2 kg) (05/30 0400)  General - Well nourished, well developed, still intubated and on sedation.  Ophthalmologic - fundi not visualized due  to eye movement.  Cardiovascular - Regular rate and rhythm.  Neuro - intubated and on sedation, eyes open on voice but not following any commands,  doll's eyes present, pupil equal reactive to light, left corneal reflex absent, right corneal reflex present, weak cough reflex present. Breathing over the vent, on pain stimulation, right upper extremity able to against gravity, left upper extremity 0/5, bilateral lower extremity withdrawal to pain 2+/5. Reflex 1+, left Babinski positive. Sensation, coordination and gait not tested. Coarse tremors right hand when held up  ASSESSMENT/PLAN Mr. LOTTIE SISKA is a 75 y.o. male with history of hypertension, hyperlipidemia, diabetes mellitus, COPD, coronary artery disease, peripheral vascular disease and TIA due to left ICA high grade stenosis admitted for right MCA stroke during left CEA procedure. Patient was intubated, as status post IV TPA and right MCA mechanical thrombectomy with re-cannulization. However, MRI showed right complete MCA infarct.    Stroke:  Non-dominant right large MCA malignant infarct s/p IV tPA and mechanical thrombectomy with recannulization, embolic secondary to procedure related.   MRI right large MCA malignant infarct  MRA  Right MCA patent with right hemisphere luxury perfusion  Repeat CT showed right hemisphere cerebral edema without significant midline shift  Carotid Doppler 03/19/15 - left ICA > 80% stenosis  2D Echo  unremarkable  LDL 40  HgbA1c 5.3  SCDs for VTE prophylaxis  aspirin 81 mg orally every day and clopidogrel 75 mg orally every day prior to admission, now on no antithrombotic due to [facility] and IVH post procedure.   Discussed with wife, daughter, and son regarding patient diagnosis, treatment plan and likely prognosis. Likely poor prognosis at this time.   SAH and IVH post procedure  CT showed right sylvian fissure SAH and small amount of IVH  Repeat CT showed improvement of SAH   Likely related to  thrombectomy  Hold off antiplatelets   Cerebral edema  Mild midline shift  Repeat CT showed minimal midline shift  Started on 3% saline --> now D/c as sodium as 164  Sodium goal 150-160  Not a candidate for hemicraniectomy based on age and medical condition  Family meeting today, please look below.   Carotid stenosis  Left ICA stenosis > 80% at bulb  Avoid hypotension  BP goal 130-160   Hypertension  Home meds: Clonidine, labetalol  Currently no BP meds  BP at high side, put on cleviprex  Avoid hypotension  BP goal 130-160   Anemia  Hb 6.1  Received PRBC transfusion  CCM on board  Close monitoring  Hyperlipidemia  Home meds: lipitor 10, resumed in hospital  LDL 40, goal < 70  Continue statin at discharge  DM  A1C 5.5  Under control  Other Stroke Risk Factors  Advanced age  Cigarette smoker, quit smoking 10 months ago , re-advised to stop smoking  Coronary artery disease - MI, PCI s/ stent 1998  PVD  Other Active Problems  Anxiety  COPD w/ emphysema  GERD  Barrett's esophagus  Depression  AAA repair 2010   CKD stage 3  hypokalemia  Other Pertinent History    .  Wife and daughter agree to palliative care and comfort care only and other family members to come  Today and say their goodbyes  Possibly by later today prior to making him full comfort care Hospital day # 6  This patient is critically ill due to large right MCA stroke, subarachnoid hemorrhage, left ICA stenosis and at significant risk of neurological worsening, death form hemorrhagic transformation, cerebral edema, brain herniation, seizure, recurrent stroke. This patient's care requires constant monitoring of vital signs, hemodynamics, respiratory and cardiac monitoring, review of multiple  databases, neurological assessment, discussion with family, other specialists and medical decision making of high complexity. I spent 40 minutes of neurocritical care time  in the care of this patient.   Audreanna Torrisi  Stroke Neurology 04/01/2015 3:08 PM    To contact Stroke Continuity provider, please refer to [email or web address] After hours, contact General Neurology

## 2015-04-01 NOTE — Progress Notes (Signed)
RT note: Patient extubated with the withdrawal of life sustaining protocol. RN at bedside. Patient resting comfortably.

## 2015-04-01 NOTE — Progress Notes (Signed)
PT Cancellation Note  Patient Details Name: [name] MRN: [id number] DOB: 1941-08-26   Cancelled Treatment:    Reason Eval/Treat Not Completed: Other (comment) (Extraordinary care being withdrawn. Will sign off.) 04/01/2015  Donnella Sham, PT [phone]  (pager)  Shannyn Jankowiak, Tessie Fass 04/01/2015, 9:45 AM

## 2015-04-01 NOTE — Progress Notes (Signed)
CRITICAL VALUE ALERT  Critical value received:  Na 166  Date of notification:  04/01/2015  Time of notification:  0610  Critical value read back:Yes.    Nurse who received alert:  Tereasa Coop, RN  MD notified (1st page):  Dr. Halford Chessman  Time of first page:  0615  MD notified (2nd page):  Time of second page:  Responding MD:  Dr. Halford Chessman  Time MD responded:  [phone]

## 2015-04-01 NOTE — Progress Notes (Signed)
PULMONARY / CRITICAL CARE MEDICINE   Name: LINDBERG ZENON MRN: [id number] DOB: 1941-08-17    ADMISSION DATE:  03/05/2015 CONSULTATION DATE:  5/24  REFERRING MD :  Janann Colonel   CHIEF COMPLAINT:  Vent management   INITIAL PRESENTATION:  [name] is a 74 yo male with multiple medical problems. He presents 5/24 for left ICA stent procedure. During the procedure patient suffered a CT confirmed ischemic stroke with left sided flaccidity and right eye deviation. tPA was administered at 1127 on 03/14/2015 and then taken for additional intervention. Returned to ICU on full vent support. PCCM consulted for vent management.     INDWELLING DEVICES:: ETT 5/24 >>  L [place] CVL 5/26  MICRO DATA: None  ANTIMICROBIALS:  None  SIGNIFICANT EVENTS/STUDIES:  5/17: MRI/MRA brain: No acute intracranial process, specifically no acute  ischemia.Involutional changes. Remote LEFT corona radiata lacunar infarct. Moderate to severe white matter changes suggest chronic small vessel ischemic disease.Scattered tiny foci of susceptibility artifact can be seen with chronic hypertension though are nonspecific. Mild luminal regularity of the proximal cerebral arteries most consistent with atherosclerosis. 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 Procedure complicated by acute R carotid occlusion 5/24 Neuro IR procedure: tPA and mechanical clot removal from R MCA with complete recanalization. Left intubated post procedure 5/24 CT head: Large territory right MCA infarct. High-density within the right sylvian fissure most likely is contrast extravasation possibly with some hemorrhage, post right MCA clot retrieval and tPA  5/25 MRI brain: Large territory acute right MCA infarct with mild hemorrhage in the right frontal infarct. Acute infarct also extends into the right PCA territory related to fetal origin of the right posterior cerebral artery.  Smaller acute infarcts also noted in the cerebellum bilaterally, left occipital lobe, and left anterior pons  5/26 CT head: Large territory right MCA infarct shows mild progression in edema since yesterday. Mild midline shift shows slight progression now measuring 2.5 mm 5/26 hypertonic saline protocol initiated by Stroke team  SUBJECTIVE/OVERNIGHT/INTERVAL HX No events overnight, unresponsive.  VITAL SIGNS: Temp:  [98.3 F (36.8 C)-101.7 F (38.7 C)] 101.7 F (38.7 C) (05/30 0748) Pulse Rate:  [109-129] 115 (05/30 1000) Resp:  [15-25] 16 (05/30 1000) BP: (129-168)/(59-71) 150/62 mmHg (05/30 1000) SpO2:  [97 %-100 %] 99 % (05/30 1000) FiO2 (%):  [40 %] 40 % (05/30 1000) Weight:  [64.2 kg (141 lb 8.6 oz)] 64.2 kg (141 lb 8.6 oz) (05/30 0400) HEMODYNAMICS:   VENTILATOR SETTINGS: Vent Mode:  [-] PRVC FiO2 (%):  [40 %] 40 % Set Rate:  [16 bmp] 16 bmp Vt Set:  [570 mL] 570 mL PEEP:  [5 cmH20] 5 cmH20 Plateau Pressure:  [15 cmH20-18 cmH20] 18 cmH20   INTAKE / OUTPUT:  Intake/Output Summary (Last 24 hours) at 04/01/15 1047 Last data filed at 04/01/15 1000  Gross per 24 hour  Intake   2520 ml  Output   1235 ml  Net   1285 ml   PHYSICAL EXAMINATION: General: RASS -3, not F/C Neuro: L hemiplegia, unresponsive HEENT: NCAT, PERRL, EOM-I and MMM Cardiovascular: Reg, no M Lungs:  Clear, poor gag, intact respiratory drive Abdomen:  Soft, + bowel sounds  EXT: Warm, no edema Skin:  Intact   LABS:  PULMONARY  Recent Labs Lab 03/14/2015 1703 03/07/2015 2145 03/27/15 0548 03/31/15 0418 04/01/15 0335  PHART 7.184* 7.263* 7.253* 7.455* 7.474*  PCO2ART 32.9* 27.5* 22.7* 40.1 38.9  PO2ART  543* 245* 187* 174* 166*  HCO3 11.9* 12.0* 9.7* 27.8* 28.3*  TCO2 12.9 12.8 10.4 29.0 29.5  O2SAT 99.7 99.3 99.0 99.0 99.3   CBC  Recent Labs Lab 03/30/15 0050 03/31/15 0535 04/01/15 0530  HGB 8.1* 8.1* 7.8*  HCT 25.1* 27.3* 26.1*  WBC 15.8* 14.7* 13.7*  PLT 196 235 227    COAGULATION No results for input(s): INR in the last 168 hours.  CARDIAC  No results for input(s): TROPONINI in the last 168 hours. No results for input(s): PROBNP in the last 168 hours.  CHEMISTRY  Recent Labs Lab 03/28/15 0234  03/29/15 0150  03/30/15 0050  03/30/15 2035 03/31/15 0210 03/31/15 0535 03/31/15 0800 04/01/15 0530  NA 146*  < > 153*  < > 161*  163*  < > 165* 165* 165* 165* 166*  K 3.1*  --  3.9  --  3.7  --   --   --  3.9  --  4.0  CL 114*  --  115*  --  125*  --   --   --  129*  --  127*  CO2 22  --  27  --  26  --   --   --  30  --  29  GLUCOSE 143*  --  134*  --  120*  --   --   --  173*  --  171*  BUN 55*  --  55*  --  59*  --   --   --  65*  --  73*  CREATININE 2.40*  --  2.26*  --  2.27*  --   --   --  2.49*  --  2.62*  CALCIUM 8.1*  --  7.9*  --  8.6*  --   --   --  8.6*  --  8.3*  MG  --   --   --   --  2.1  --   --   --  2.2  --  2.1  PHOS  --   --   --   --  2.7  --   --   --  3.9  --  3.7  < > = values in this interval not displayed. Estimated Creatinine Clearance: 22.8 mL/min (by C-G formula based on Cr of 2.62).  LIVER No results for input(s): AST, ALT, ALKPHOS, BILITOT, PROT, ALBUMIN, INR in the last 168 hours.  INFECTIOUS No results for input(s): LATICACIDVEN, PROCALCITON in the last 168 hours.  ENDOCRINE CBG (last 3)   Recent Labs  03/31/15 2322 04/01/15 0318 04/01/15 0748  GLUCAP 162* 180* 156*   IMAGING x48h  - iDg Chest Port 1 View  04/01/2015   CLINICAL DATA:  Check endotracheal tube, history of stroke  EXAM: PORTABLE CHEST - 1 VIEW  COMPARISON:  03/31/2015  FINDINGS: Cardiac shadow is stable. A left subclavian central venous line, endotracheal tube and nasogastric catheter are again noted and stable. The lungs are well aerated bilaterally without focal infiltrate or sizable effusion. No acute bony abnormality is seen.  IMPRESSION: No acute abnormality noted.   Electronically Signed   By: Inez Catalina M.D.   On: 04/01/2015 08:10    Dg Chest Port 1 View  03/31/2015   CLINICAL DATA:  ETTHx of Cancer, DM, HTN, COPD, CAD, barrett's esophagus with high grade dysplasia, hiatal hernia  EXAM: PORTABLE CHEST - 1 VIEW  COMPARISON:  03/30/2015  FINDINGS: Endotracheal tube, nasogastric tube and left subclavian central venous line  are stable and well positioned.  Lungs are hyperexpanded. No convincing pneumonia or pulmonary edema. No pleural effusion or pneumothorax  IMPRESSION: 1. No change from the previous day's study. 2. COPD.  No convincing acute cardiopulmonary disease.   Electronically Signed   By: Lajean Manes M.D.   On: 03/31/2015 09:11   ASSESSMENT / PLAN:  PULMONARY A: VDRF - intubated for neuro IR procedure  Remains intubated due to AMS COPD without wheezing  P:   Terminal extubation.  CARDIOVASCULAR A: HTN Bradycardia, resolved P: D/C clevaprex.  RENAL A:  Hx of renal tubular acidosis with CKD, baseline Cr 2.07 AKI - now improving P: D/C further blood draws.  GASTROINTESTINAL A:  NO issues P:   D/C TF.  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  INFECTIOUS A:   No acute problem P: D/C abx.  ENDOCRINE A:  DM 2 P:   D/C CBGs.  NEUROLOGIC A:  Large R MCA teritory CVA Status post re-canalization 5/24   - still with poor mental status. RASS -4 on diprivan P:   Morphine for comfort.  Updates: Spoke with family, ready for withdraw, outlined plan with him, will place orders for withdrawal in.  The patient is critically ill with multiple organ systems failure and requires high complexity decision making for assessment and support, frequent evaluation and titration of therapies, application of advanced monitoring technologies and extensive interpretation of multiple databases.   Critical Care Time devoted to patient care services described in this note is  35  Minutes.  This time reflects time of care of this signee Dr Jennet Maduro. This critical care time does not reflect procedure time, or teaching time or supervisory time of PA/NP/Med student/Med Resident etc but could involve care discussion time.  Rush Farmer, M.D. [facility] Pulmonary/Critical Care Medicine. Pager: [phone]. After hours pager: [phone].  04/01/2015 10:47 AM

## 2015-04-01 NOTE — Progress Notes (Signed)
Chaplain responded to spiritual care consult for end of life. Chaplain facilitated storytelling and offered word of prayer at pt family request. Chaplain informed pt family of her services should they need them. Page chaplain as needed.    04/01/15 1200  Clinical Encounter Type  Visited With Family  Visit Type Initial;Spiritual support  Referral From Physician  Spiritual Encounters  Spiritual Needs Emotional;Prayer;Grief support  Stress Factors  Family Stress Factors Loss  Chonita Gadea, Barbette Hair, Chaplain 04/01/2015 12:35 PM

## 2015-04-01 NOTE — Progress Notes (Signed)
OT Cancellation Note  Patient Details Name: [name] MRN: [id number] DOB: 09-26-1941   Cancelled Treatment:    Reason Eval/Treat Not Completed: Medical issues which prohibited therapy - family moving toward full comfort care.  Will sign off at this time.   Darlina Rumpf [place], OTR/L [phone]  04/01/2015, 10:16 AM

## 2015-04-02 MED ORDER — ACETAMINOPHEN 650 MG RE SUPP
650.0000 mg | Freq: Four times a day (QID) | RECTAL | Status: DC | PRN
  Administered 2015-04-02: 650 mg via RECTAL
  Filled 2015-04-02: qty 1

## 2015-04-02 MED ORDER — ATROPINE SULFATE 1 % OP SOLN: 2.0000 [drp] | OPHTHALMIC | Status: DC

## 2015-04-03 NOTE — Progress Notes (Addendum)
STROKE TEAM PROGRESS NOTE   SUBJECTIVE (INTERVAL HISTORY) His wife and daughter are at the bedside. They feel pt is comfortable on his morphine drip. Patient now DO NOT RESUSCITATE and comfort care only     OBJECTIVE Temp:  [99.5 F (37.5 C)-101.3 F (38.5 C)] 101.3 F (38.5 C) (05/31 0509) Pulse Rate:  [101-117] 101 (05/31 0509) Cardiac Rhythm:  [-]  Resp:  [15-40] 15 (05/31 0509) BP: (146-195)/(63-87) 146/63 mmHg (05/31 0509) SpO2:  [39 %-95 %] 93 % (05/31 0509)  PHYSICAL EXAM  General - Well nourished, well developed, still intubated and on sedation.  Ophthalmologic - fundi not visualized due to eye movement.  Cardiovascular - Regular rate and rhythm.  Neuro - extubated on morphine drip  eyes closed on voice but not following any commands, doll's eyes present, pupil equal reactive to light, left corneal reflex absent, right corneal reflex present, weak cough reflex present.  , on pain stimulation, right upper extremity able to move against gravity, left upper extremity 0/5, bilateral lower extremity withdrawal to pain 2+/5. Reflex 1+, left Babinski positive. Sensation, coordination and gait not tested. Coarse tremors right hand when held up   ASSESSMENT/PLAN [name] is a 74 y.o. male with history of hypertension, hyperlipidemia, diabetes mellitus, COPD, coronary artery disease, peripheral vascular disease and TIA due to left ICA high grade stenosis admitted for right MCA stroke during left CEA procedure. Patient was intubated, as status post IV TPA and right MCA mechanical thrombectomy with re-cannulization. However, MRI showed right complete MCA infarct.    Stroke:  Non-dominant right large MCA malignant infarct s/p IV tPA and mechanical thrombectomy with recannulization. Resultant cytotoxic cerebral edema with right to left shift. Stroke embolic related to L CEA procedure.  MRI right large MCA malignant infarct  MRA  Right MCA patent with right hemisphere luxury  perfusion  Repeat CT showed right hemisphere cerebral edema without significant midline shift  Carotid Doppler 03/19/15 - left ICA > 80% stenosis  2D Echo  unremarkable  LDL 40  HgbA1c 5.3  aspirin 81 mg orally every day and clopidogrel 75 mg orally every day prior to admission, now on no antithrombotic due to [facility] and IVH post procedure.   Patient now comfort care, on morphine drip. Some congestion reported. Will add atropine drops.   SAH and IVH post procedure  CT showed right sylvian fissure SAH and small amount of IVH  Repeat CT showed improvement of SAH   Likely related to thrombectomy  Hold off antiplatelets   Cerebral edema with induced hypernatremia  Mild midline shift  Repeat CT showed minimal midline shift  Started on 3% saline to decrease cerebral edema --> now D/c as sodium as 164  Not a candidate for hemicraniectomy based on age and medical condition  Carotid stenosis  Left ICA stenosis > 80% at bulb  Hypertension  Home meds: Clonidine, labetalol  Anemia  Hb 6.1  Received PRBC transfusion  Hyperlipidemia  Home meds: lipitor 10, resumed in hospital  LDL 40, goal < 70  DM  A1C 5.5  Under control  Other Stroke Risk Factors  Advanced age  Cigarette smoker, quit smoking 10 months ago , re-advised to stop smoking  Coronary artery disease - MI, PCI s/ stent 1998  PVD  Other Active Problems  Anxiety  COPD w/ emphysema  GERD  Barrett's esophagus  Depression  AAA repair 2010   CKD stage 3  Hypokalemia  Acute respiratory failure hypercapnoic now resolved  Hospital day #  [place] for Pager information 04-15-2015 3:02 PM   I have personally examined this patient, reviewed notes, independently viewed imaging studies, participated in medical decision making and plan of care. I have made any additions or clarifications directly to the above note. Agree with note above.  Discussed with wife and daughter at the bedside. They appear comfortable with her decision for comfort care and answered questions.  Antony Contras, MD Medical Director [facility] Stroke Center Pager: [phone] 04/15/2015 3:02 PM  To contact Stroke Continuity provider, please refer to [email or web address] After hours, contact General Neurology

## 2015-04-03 NOTE — Progress Notes (Signed)
Nutrition Brief Note  Chart reviewed. Pt now transitioning to comfort care.  No further nutrition interventions warranted at this time.  Please re-consult as needed.   Rodolphe Edmonston A. Kamali Nephew, RD, LDN, CDE Pager: [phone] After hours Pager: [phone]  

## 2015-04-03 NOTE — Progress Notes (Signed)
Called to room by patient's wife who stated he was no longer breathing.  This was confirmed by two RN's (myself and Ludwig Clarks).  Time of death 12:35pm. Dr. Leonie Man was notified and gave orders that two RN's could pronounce death.  Patient's wife was at bedside. CDS notified and patient is not eligible for organ donation.  264mL Morphine wasted in sink with Page Spiro, RN. Patient to be transported to the morgue for pick-up and bed placement notified that post-mortem checklist complete.

## 2015-04-03 NOTE — Discharge Summary (Addendum)
Patient ID: [name] MRN: [id number] DOB/AGE: December 29, 1940 74 y.o.  Admit date: 04-21-15 Death date: 2015/04/28 1037-02-25 am  Admission Diagnoses: Stroke  Cause of Death:  Respiratory failure due to large right middle cerebral artery infarct with malignant cerebral edema and brain herniation status post IV tPA and mechanical thrombectomy with complete recanalization which occurred prior to attempted elective left ICA stenting.  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 male with known left ICA stenosis who was undergoing a left ICA stent procedure today. Patient was placed on the table and groin sheath placed. 25 cc dye was injected when patient was noted to have left sided flaccidity and right eye deviation. Code stroke was called and patient was brought straight to IR. There CT scan of head was obtained showing no bleed, Patient was intubated for blood pressure control and IV tPA was administered. Due to concern this is a large vessel occlusion cerebral angiogram will be obtained with possible intervention.  Date last known well: Date: 2015/04/21 Time last known well: Time: 10:25 tPA Given: Yes Modified Rankin: Rankin Score=0 His neurological condition did not improve after IV tPA hence he was taken for emergent cerebral angiogram which showed right middle cerebral artery occlusion and after taking informed consent from the patient's family he underwent  complete recanalization of occluded RT MCA with 7mg  of IA tpa and 3 passes with trevoprovue and x1 pass with  4x 40 solitaire FR device with TICI # revascularization by Dr Estanislado Pandy. He was admitted to the intensive care unit where blood pressure was tightly controlled. His neurological condition however did not improve and he continued to have some right gaze deviation, left gaze palsy, dense left hemiplegia. Subsequent MRI scan of the brain showed a large right brain infarct with malignant cerebral edema. Carotid ultrasound showed greater than 80% left ICA stenosis. Transthoracic echo was unremarkable. LDL cholesterol wasn't at goal at 40 and hemoglobin A1c was 5.3. The patient's prognosis was poor given significant neurological deficits despite complete recanalization. The patient had expressed desire to his family that he did not want life support, tracheostomy, PEG and to live in a nursing home requiring 24-hour assist which was unfortunately unavoidable. After several discussions with the patient's family they agreed to DO NOT RESUSCITATE and eventually comfort care and palliative care. Patient was kept comfortable on morphine drip after extubation and transferred to the palliative care floor. He remained peaceful death and passed away and was pronounced dead by the RN on 04-28-15 at 59 38 am. Ct Head Wo Contrast 03/28/15 Large territory right MCA infarct shows mild progression in edema since yesterday. Mild midline shift shows slight progression now measuring 2.5 mm. Improvement high attenuation blood/ contrast in the right sylvian fissure. Small amount of blood in the lateral ventricle.  Apr 21, 2015 IMPRESSION: Large territory right MCA infarct. High-density within the right sylvian fissure most likely is contrast extravasation possibly with some hemorrhage, post right MCA clot retrieval and tPA.  Signed: SETHI,PRAMOD April 28, 2015, 1:04 PM

## 2015-04-03 NOTE — Progress Notes (Signed)
Chaplain was leaving the floor and saw the family in the waiting area.Chaplain asked if they needed anything. They said they had lost a loved one. Pt's spouse said she knew he was in heaven. Chaplain offered prayers and prayed with them.

## 2015-04-03 DEATH — deceased

## 2015-04-04 ENCOUNTER — Telehealth: Payer: Self-pay | Admitting: Internal Medicine

## 2015-04-04 ENCOUNTER — Telehealth: Payer: Self-pay | Admitting: *Deleted

## 2015-04-04 ENCOUNTER — Inpatient Hospital Stay: Payer: Self-pay | Admitting: Internal Medicine

## 2015-04-04 LAB — CULTURE, BLOOD (ROUTINE X 2)
CULTURE: NO GROWTH
CULTURE: NO GROWTH

## 2015-04-04 NOTE — Telephone Encounter (Signed)
Oncology Nurse Navigator Documentation  Oncology Nurse Navigator Flowsheets 04/04/2015  Navigator Encounter Type Telephone  Time Spent with Patient 15    Wife called me to inform that patient had died on 2015-04-27 as a result of complications from severe stroke.  She expressed appreciation for our care while he was being treated at [facility].  I expressed condolence on behalf of staff.  Gayleen Orem, RN, BSN, [facility] [phone]

## 2015-04-04 NOTE — Telephone Encounter (Signed)
Patient's wife called to let you know that patient has passed away and she wanted to thank you for everything you have done for him.

## 2015-04-12 ENCOUNTER — Ambulatory Visit: Payer: Self-pay | Admitting: Internal Medicine

## 2015-06-13 ENCOUNTER — Ambulatory Visit (HOSPITAL_COMMUNITY): Payer: Non-veteran care

## 2015-06-13 ENCOUNTER — Ambulatory Visit: Payer: Non-veteran care

## 2015-06-14 ENCOUNTER — Ambulatory Visit: Payer: Non-veteran care

## 2015-06-14 ENCOUNTER — Ambulatory Visit: Payer: Non-veteran care | Admitting: Radiation Oncology

## 2015-09-24 ENCOUNTER — Ambulatory Visit: Payer: Medicare Other

## 2016-02-01 IMAGING — CR DG CHEST 1V PORT
2 series · 2 of 2 positions shown · non-contrast
Comparison: Portable chest x-ray March 27, 2015

CLINICAL DATA: Respiratory failure, diabetes, CVA.

EXAM:
PORTABLE CHEST - 1 VIEW

[AP (1 of 2)]
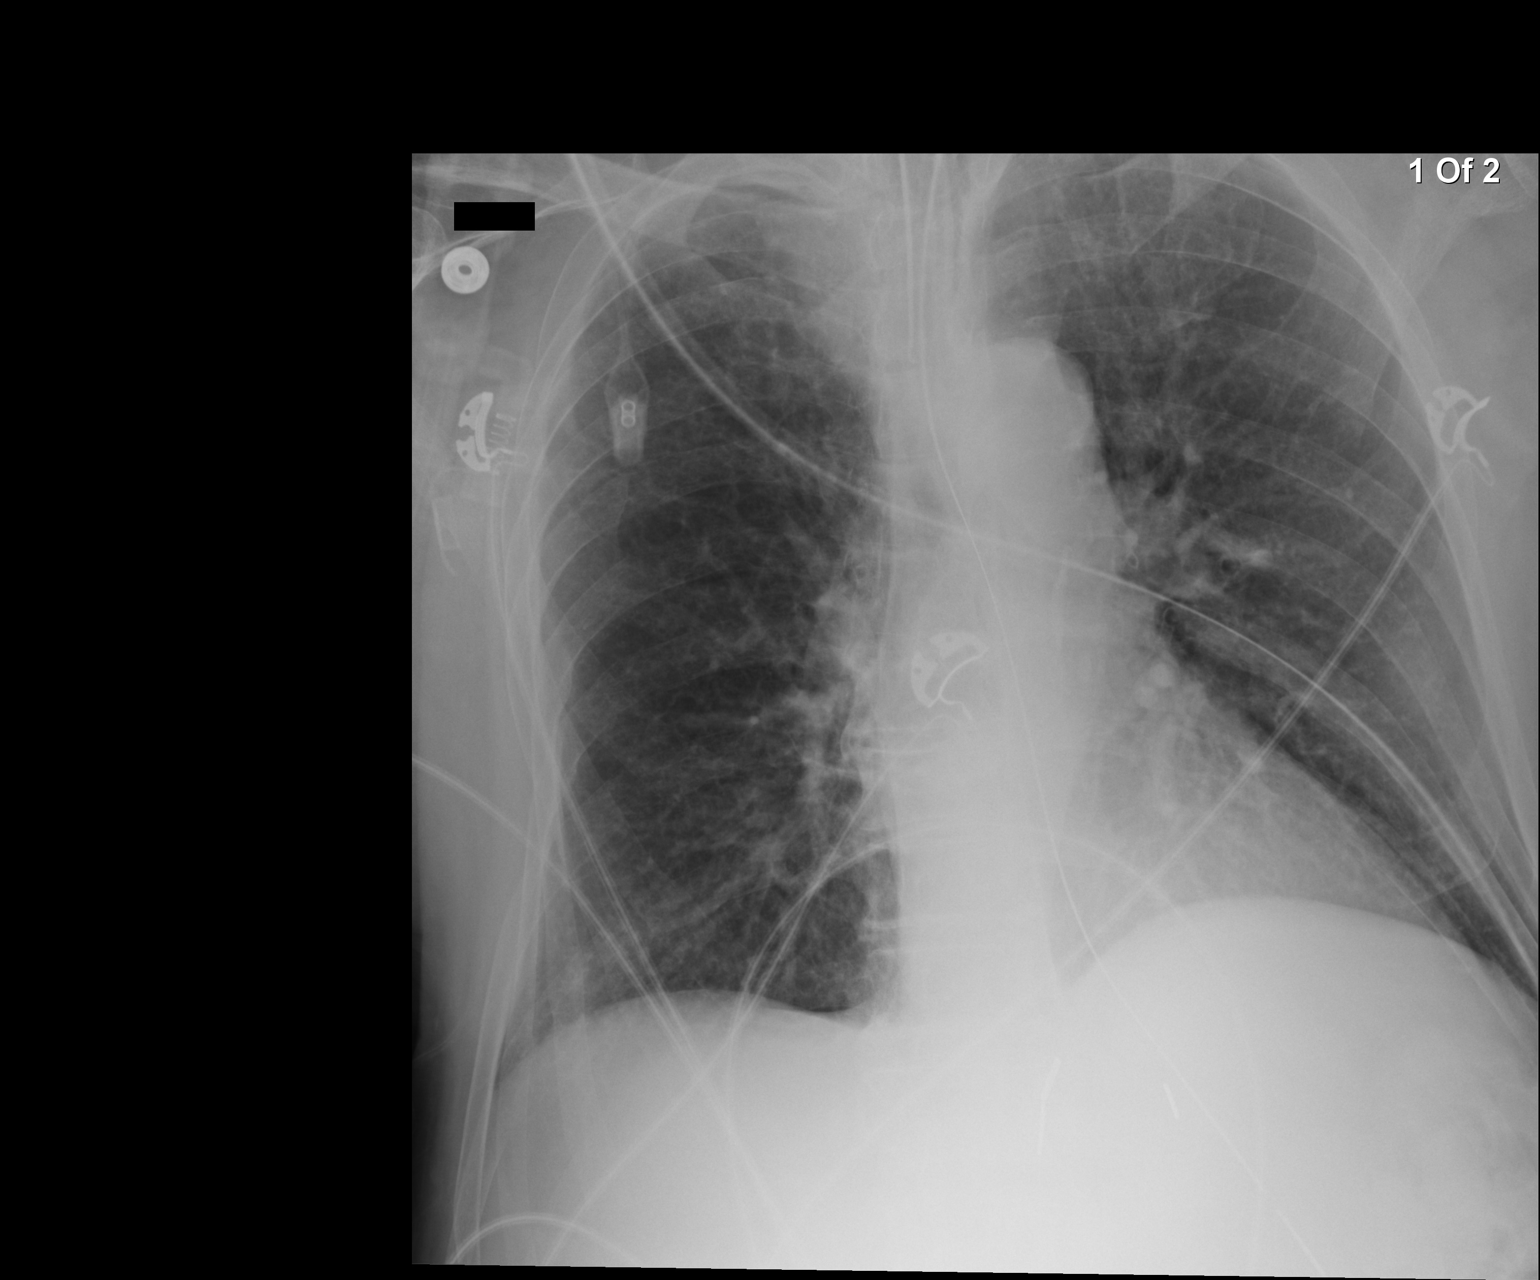

[AP (2 of 2)]
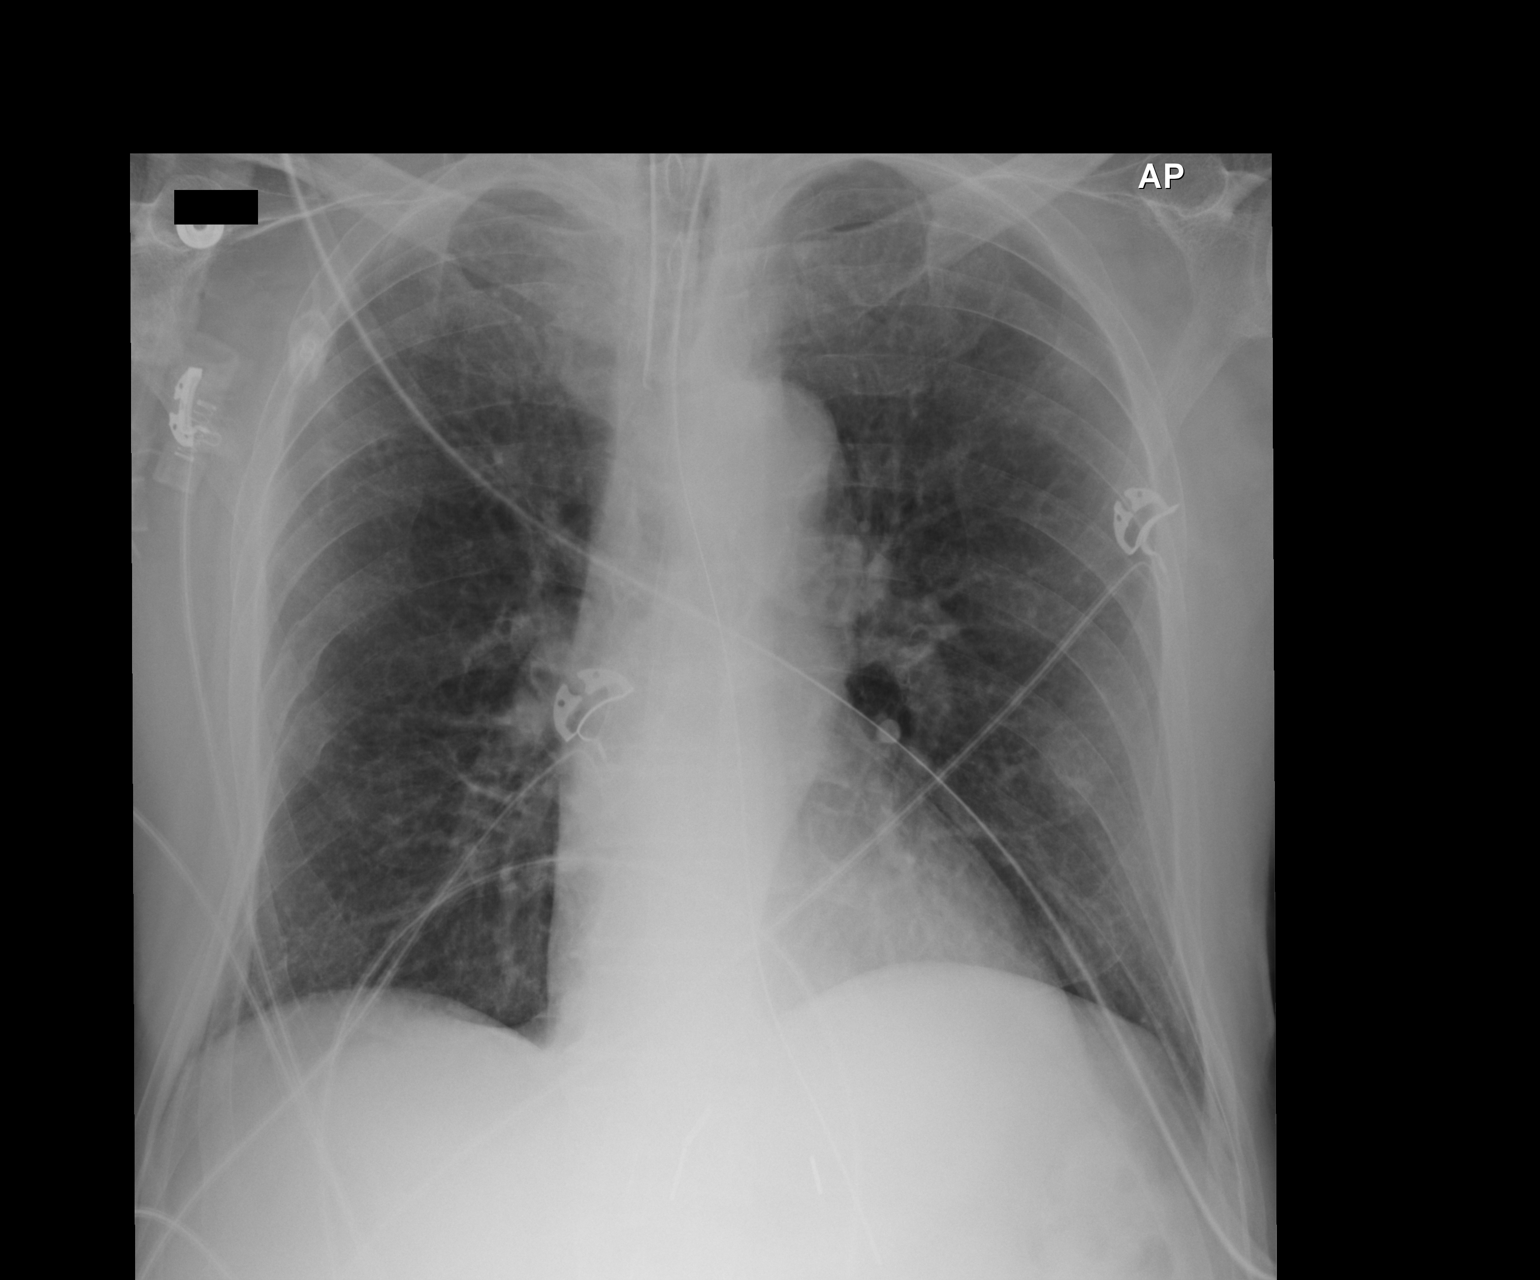

[2 of 2 positions shown; findings below may reference images not displayed]

FINDINGS: There is been interval intubation of the esophagus. The tip of the
EG tube projects below the inferior margin of the image. The
endotracheal tube tip projects 4.2 cm above the carina. The lungs
remain well-expanded and clear. The heart and pulmonary vascularity
are normal. There is no pleural effusion or pneumothorax. There are
old rib deformities on the right.
IMPRESSION: Interval intubation of the esophagus ; stable positioning of the
endotracheal tube. No acute cardiopulmonary abnormality is
demonstrated.

## 2016-02-01 IMAGING — CT CT HEAD W/O CM
2 series · 15 of 30 positions shown, 19 images · non-contrast
Comparison: CT and MRI 03/27/2015

CLINICAL DATA: Stroke

EXAM:
CT HEAD WITHOUT CONTRAST
TECHNIQUE: Contiguous axial images were obtained from the base of the skull
through the vertex without intravenous contrast.

[Series 201: head w/o, idose (1) · axial · non-contrast · 0.49mm/px · z∈[+46,+186]mm · 13 of 34 slices shown, 17 images]
[im 3/34  brain]
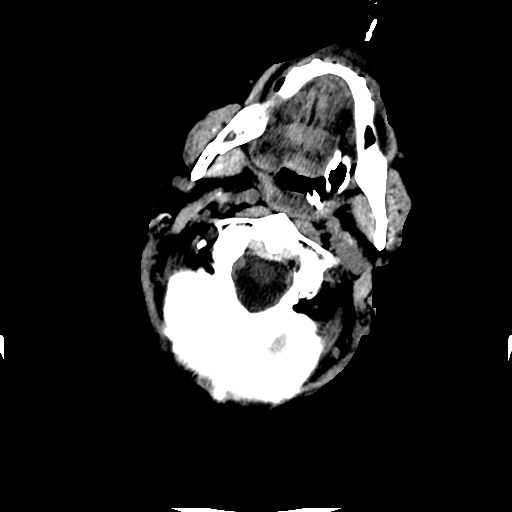
[im 3/34  bone]
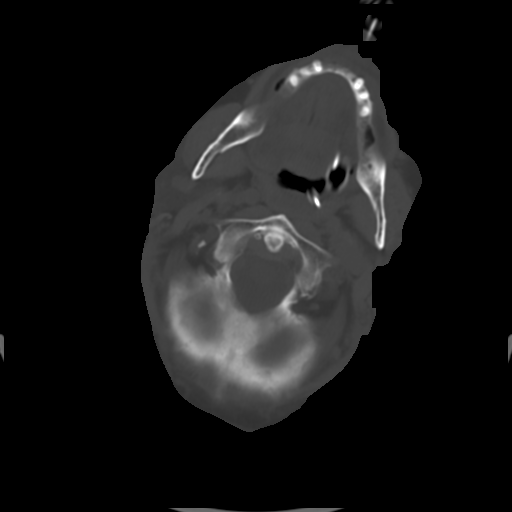
[im 5/34  brain]
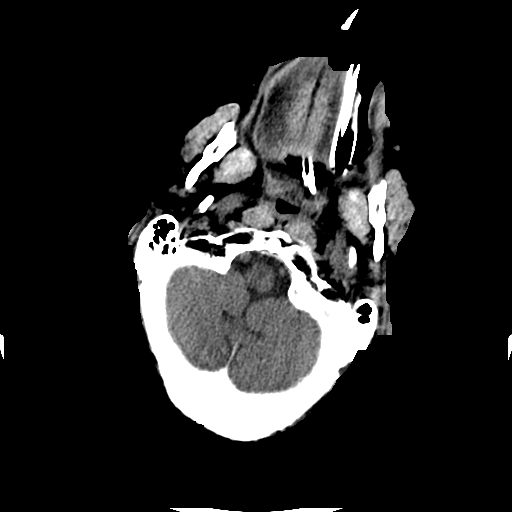
[im 8/34  brain]
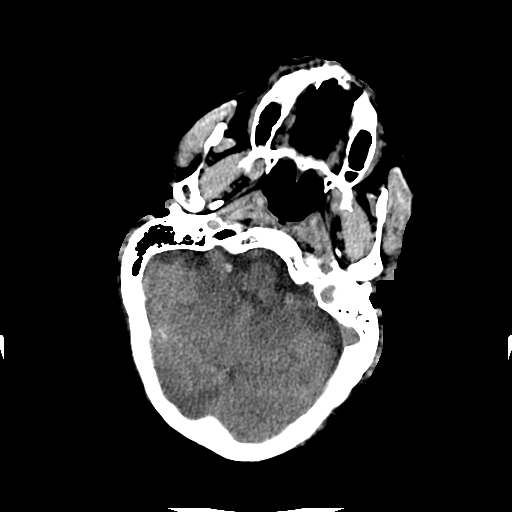
[im 10/34  brain]
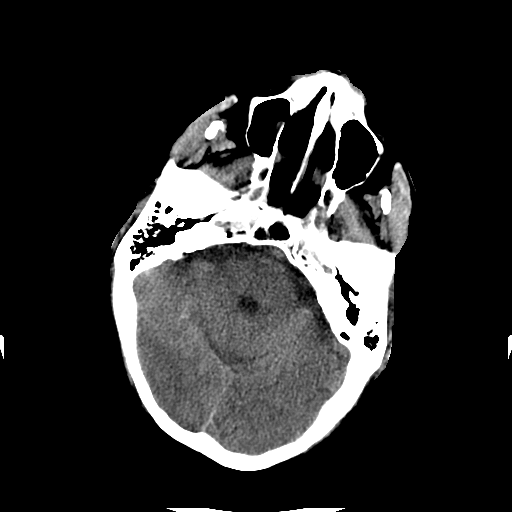
[im 12/34  brain]
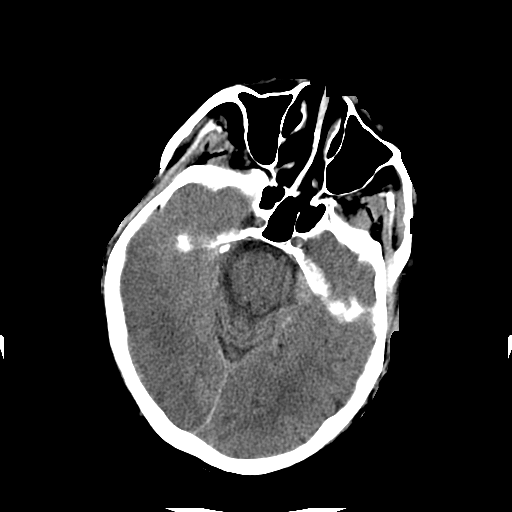
[im 12/34  bone]
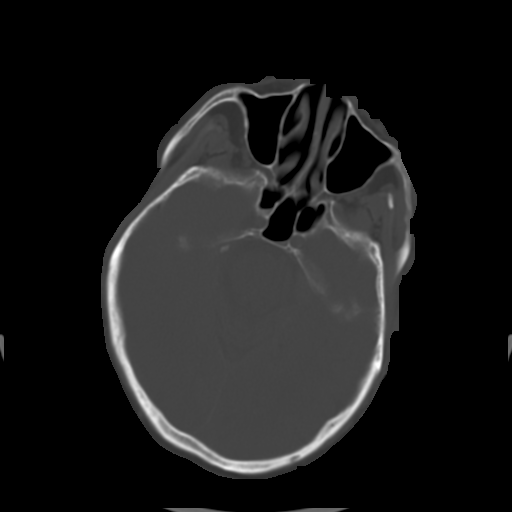
[im 15/34  brain]
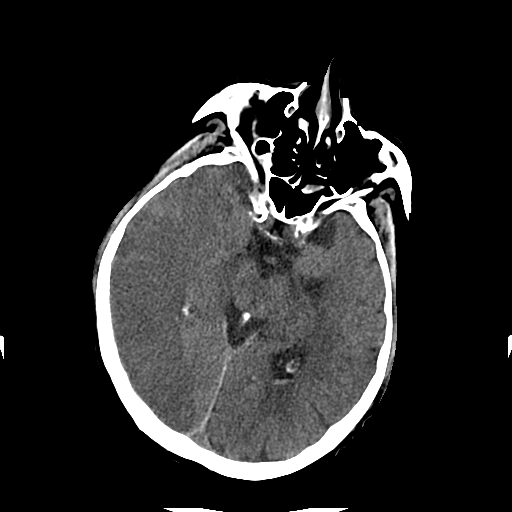
[im 17/34  brain]
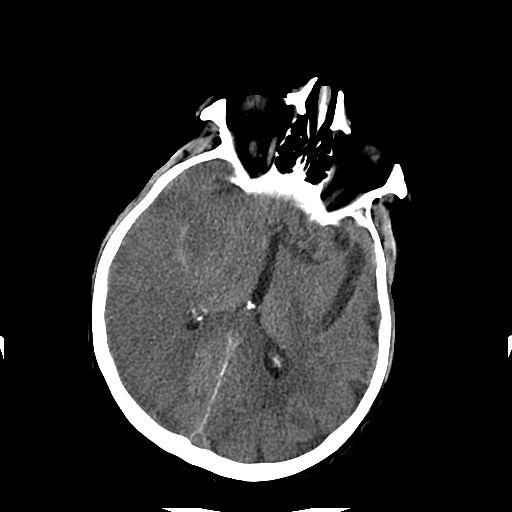
[im 19/34  brain]
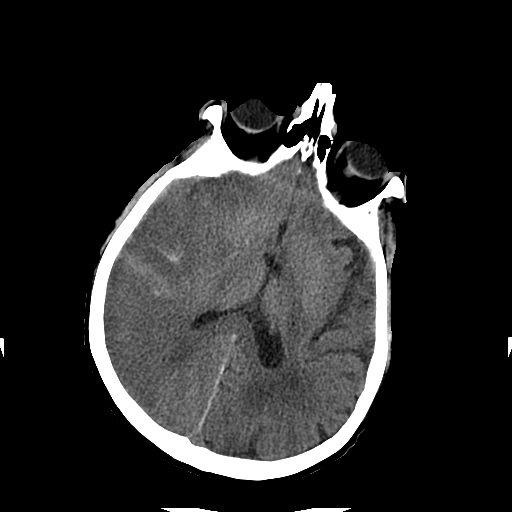
[im 22/34  brain]
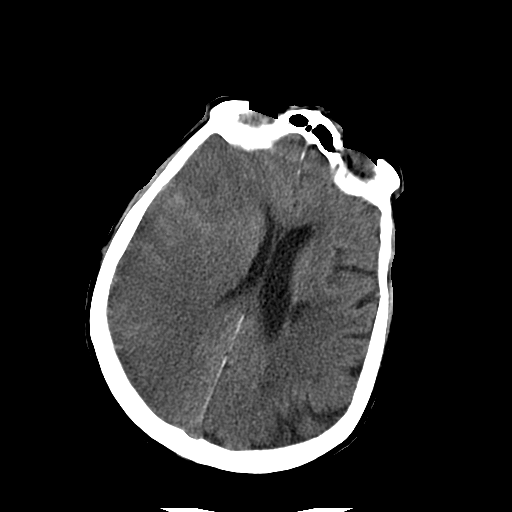
[im 22/34  bone]
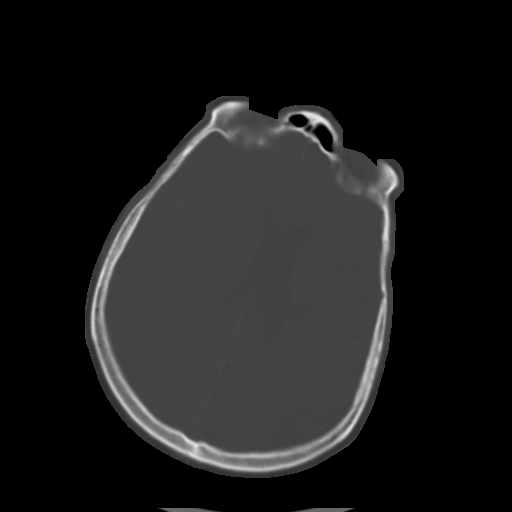
[im 24/34  brain]
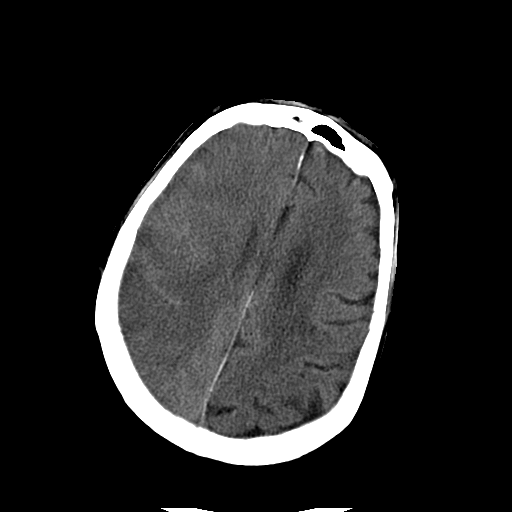
[im 26/34  brain]
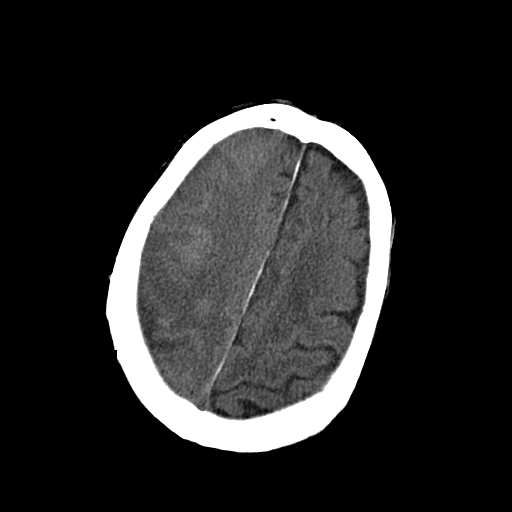
[im 29/34  brain]
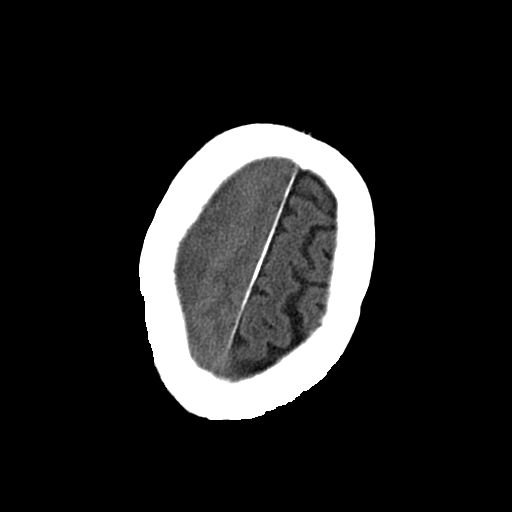
[im 31/34  brain]
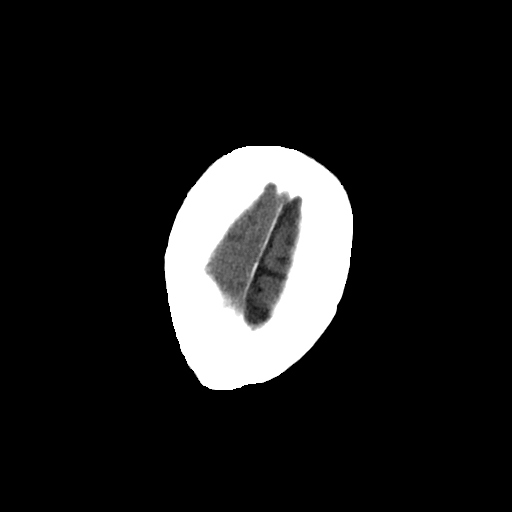
[im 31/34  bone]
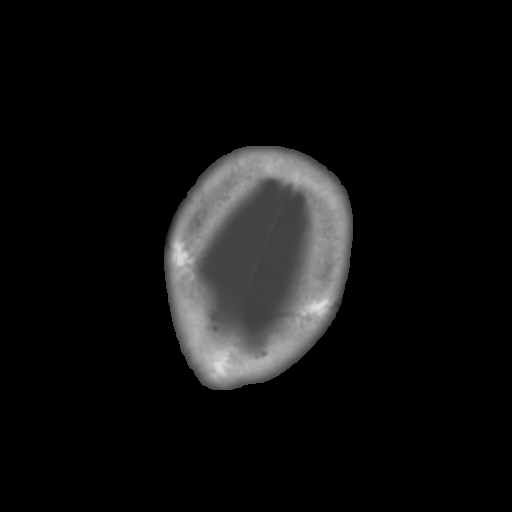

[Series 202: head w/o bone, idose (1) · axial · non-contrast · 0.49mm/px · z∈[+46,+71]mm · 2 of 34 slices shown]
[im 3/34  bone]
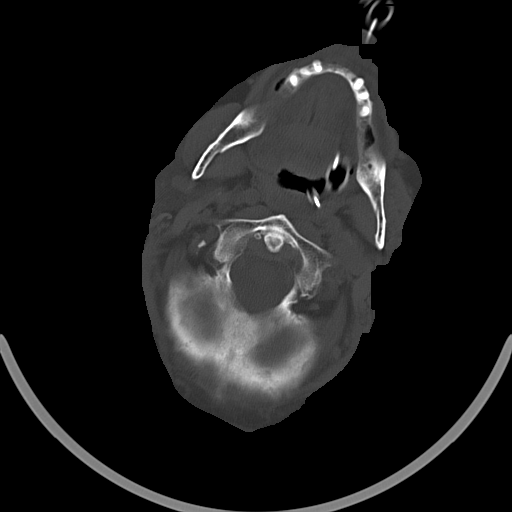
[im 8/34  bone]
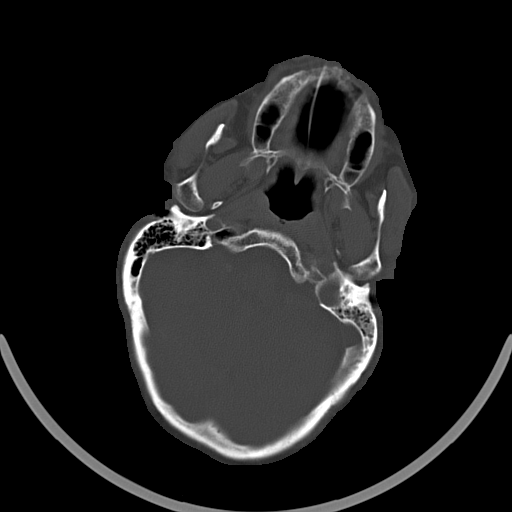

[15 of 30 positions shown; findings below may reference images not displayed]

FINDINGS: Large territory right MCA infarct shows progressive edema from
yesterday. There is edema in the right basal ganglia which also
appears to have progressed. Diffuse low-density edema is seen
throughout the right frontal, temporal, parietal lobes with
extension into the right occipital lobe. High attenuation blood/
contrast in the right sylvian fissure has improved.

Acute infarct in the cerebellum bilaterally best seen on MRI. Small
acute infarct in the left occipital lobe and left pons also better
seen by MRI.

Small amount of blood layering in the left occipital horn, not seen
previously. This likely is related to previous subarachnoid
hemorrhage entering the ventricle.

Mild midline shift has progressed now measuring approximately
mm, ventricle size remains normal
IMPRESSION: Large territory right MCA infarct shows mild progression in edema
since yesterday. Mild midline shift shows slight progression now
measuring 2.5 mm.

Improvement high attenuation blood/ contrast in the right sylvian
fissure. Small amount of blood in the lateral ventricle.

## 2016-02-02 IMAGING — CR DG CHEST 1V PORT
1 series · 1 of 1 positions shown · non-contrast
Comparison: March 28, 2015

CLINICAL DATA: Hypoxia

EXAM:
PORTABLE CHEST - 1 VIEW

[AP]
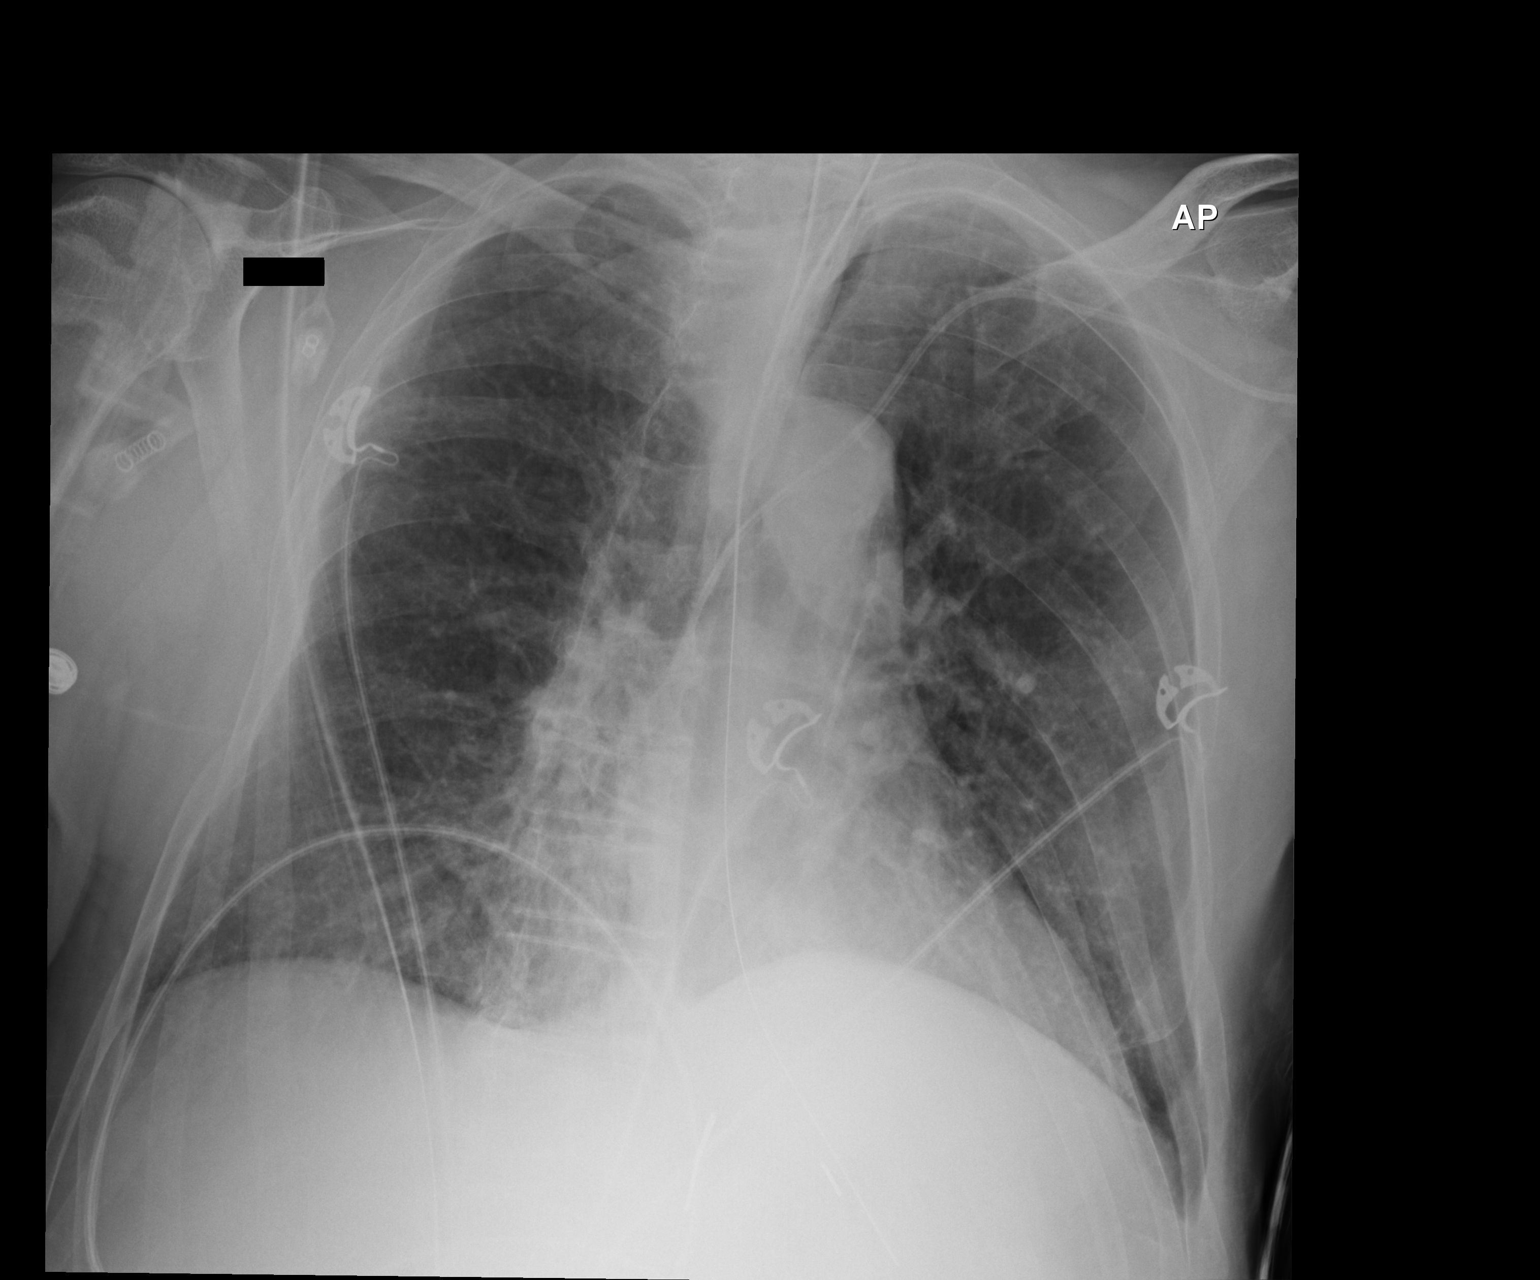

[1 of 1 positions shown; findings below may reference images not displayed]

FINDINGS: Endotracheal tube tip is 2.2 cm above the carina. Central catheter
tip is in the superior vena cava. Nasogastric tube tip and side port
are in the stomach. No pneumothorax. There is no edema or
consolidation. Heart size and pulmonary vascularity are normal. No
adenopathy.
IMPRESSION: Tube and catheter positions as described without pneumothorax. No
edema or consolidation.
